# Patient Record
Sex: Male | Born: 1955 | Race: Black or African American | Hispanic: No | Marital: Married
Health system: Southern US, Community
[De-identification: ages and names within clinical notes are randomized; demographics above are authoritative.]

## PROBLEM LIST (undated history)

## (undated) DIAGNOSIS — E119 Type 2 diabetes mellitus without complications: Secondary | ICD-10-CM

## (undated) DIAGNOSIS — I509 Heart failure, unspecified: Secondary | ICD-10-CM

## (undated) DIAGNOSIS — E7800 Pure hypercholesterolemia, unspecified: Secondary | ICD-10-CM

## (undated) DIAGNOSIS — J42 Unspecified chronic bronchitis: Secondary | ICD-10-CM

## (undated) DIAGNOSIS — I10 Essential (primary) hypertension: Secondary | ICD-10-CM

## (undated) DIAGNOSIS — R0600 Dyspnea, unspecified: Secondary | ICD-10-CM

## (undated) DIAGNOSIS — J449 Chronic obstructive pulmonary disease, unspecified: Secondary | ICD-10-CM

## (undated) DIAGNOSIS — M1990 Unspecified osteoarthritis, unspecified site: Secondary | ICD-10-CM

## (undated) DIAGNOSIS — I2510 Atherosclerotic heart disease of native coronary artery without angina pectoris: Secondary | ICD-10-CM

## (undated) DIAGNOSIS — N189 Chronic kidney disease, unspecified: Secondary | ICD-10-CM

## (undated) HISTORY — PX: MULTIPLE TOOTH EXTRACTIONS: SHX2053

## (undated) HISTORY — PX: KNEE CARTILAGE SURGERY: SHX688

## (undated) HISTORY — PX: JOINT REPLACEMENT: SHX530

---

## 1998-05-14 ENCOUNTER — Inpatient Hospital Stay (HOSPITAL_COMMUNITY): Admission: EM | Admit: 1998-05-14 | Discharge: 1998-05-16 | Payer: Self-pay | Admitting: Emergency Medicine

## 2004-07-26 ENCOUNTER — Emergency Department (HOSPITAL_COMMUNITY): Admission: EM | Admit: 2004-07-26 | Discharge: 2004-07-27 | Payer: Self-pay

## 2012-08-21 ENCOUNTER — Ambulatory Visit
Admission: RE | Admit: 2012-08-21 | Discharge: 2012-08-21 | Disposition: A | Discharge status: Home / Self Care | Payer: Medicaid Other | Source: Ambulatory Visit | Attending: Cardiovascular Disease | Admitting: Cardiovascular Disease

## 2012-08-21 ENCOUNTER — Other Ambulatory Visit: Payer: Self-pay | Admitting: Cardiovascular Disease

## 2012-08-21 DIAGNOSIS — M25569 Pain in unspecified knee: Secondary | ICD-10-CM

## 2012-09-22 ENCOUNTER — Ambulatory Visit
Admission: RE | Admit: 2012-09-22 | Discharge: 2012-09-22 | Disposition: A | Discharge status: Home / Self Care | Payer: Medicaid Other | Source: Ambulatory Visit | Attending: Cardiovascular Disease | Admitting: Cardiovascular Disease

## 2012-09-22 ENCOUNTER — Other Ambulatory Visit: Payer: Self-pay | Admitting: Cardiovascular Disease

## 2012-09-22 DIAGNOSIS — J40 Bronchitis, not specified as acute or chronic: Secondary | ICD-10-CM

## 2016-12-09 ENCOUNTER — Emergency Department (HOSPITAL_COMMUNITY): Payer: Medicaid Other

## 2016-12-09 ENCOUNTER — Emergency Department (HOSPITAL_COMMUNITY)
Admission: EM | Admit: 2016-12-09 | Discharge: 2016-12-09 | Disposition: A | Discharge status: Home / Self Care | Payer: Medicaid Other | Attending: Emergency Medicine | Admitting: Emergency Medicine

## 2016-12-09 ENCOUNTER — Encounter (HOSPITAL_COMMUNITY): Payer: Self-pay

## 2016-12-09 DIAGNOSIS — F17200 Nicotine dependence, unspecified, uncomplicated: Secondary | ICD-10-CM | POA: Insufficient documentation

## 2016-12-09 DIAGNOSIS — R0602 Shortness of breath: Secondary | ICD-10-CM | POA: Insufficient documentation

## 2016-12-09 DIAGNOSIS — I10 Essential (primary) hypertension: Secondary | ICD-10-CM | POA: Diagnosis not present

## 2016-12-09 HISTORY — DX: Essential (primary) hypertension: I10

## 2016-12-09 HISTORY — DX: Pure hypercholesterolemia, unspecified: E78.00

## 2016-12-09 LAB — CBC
HEMATOCRIT: 38.9 % — AB (ref 39.0–52.0)
HEMOGLOBIN: 12.7 g/dL — AB (ref 13.0–17.0)
MCH: 27 pg (ref 26.0–34.0)
MCHC: 32.6 g/dL (ref 30.0–36.0)
MCV: 82.6 fL (ref 78.0–100.0)
Platelets: 143 10*3/uL — ABNORMAL LOW (ref 150–400)
RBC: 4.71 MIL/uL (ref 4.22–5.81)
RDW: 13.2 % (ref 11.5–15.5)
WBC: 3.9 10*3/uL — ABNORMAL LOW (ref 4.0–10.5)

## 2016-12-09 LAB — BASIC METABOLIC PANEL
ANION GAP: 12 (ref 5–15)
BUN: 19 mg/dL (ref 6–20)
CALCIUM: 9.1 mg/dL (ref 8.9–10.3)
CO2: 21 mmol/L — AB (ref 22–32)
Chloride: 101 mmol/L (ref 101–111)
Creatinine, Ser: 1.6 mg/dL — ABNORMAL HIGH (ref 0.61–1.24)
GFR calc Af Amer: 52 mL/min — ABNORMAL LOW (ref >60)
GFR calc non Af Amer: 45 mL/min — ABNORMAL LOW (ref >60)
GLUCOSE: 112 mg/dL — AB (ref 65–99)
Potassium: 3.6 mmol/L (ref 3.5–5.1)
Sodium: 134 mmol/L — ABNORMAL LOW (ref 135–145)

## 2016-12-09 LAB — I-STAT TROPONIN, ED: TROPONIN I, POC: 0 ng/mL (ref 0.00–0.08)

## 2016-12-09 MED ORDER — ALBUTEROL SULFATE HFA 108 (90 BASE) MCG/ACT IN AERS: 1.0000 | INHALATION_SPRAY | Freq: Four times a day (QID) | RESPIRATORY_TRACT | 0 refills | Status: DC | PRN

## 2016-12-09 MED ORDER — ALBUTEROL SULFATE HFA 108 (90 BASE) MCG/ACT IN AERS
2.0000 | INHALATION_SPRAY | Freq: Once | RESPIRATORY_TRACT | Status: AC
  Administered 2016-12-09: 2 via RESPIRATORY_TRACT
  Filled 2016-12-09: qty 6.7

## 2016-12-09 MED ORDER — AZITHROMYCIN 250 MG PO TABS: 250.0000 mg | ORAL_TABLET | Freq: Every day | ORAL | 0 refills | Status: DC

## 2016-12-09 MED ORDER — AZITHROMYCIN 250 MG PO TABS
500.0000 mg | ORAL_TABLET | Freq: Once | ORAL | Status: AC
  Administered 2016-12-09: 500 mg via ORAL
  Filled 2016-12-09: qty 2

## 2016-12-09 MED ORDER — IPRATROPIUM-ALBUTEROL 0.5-2.5 (3) MG/3ML IN SOLN
3.0000 mL | Freq: Once | RESPIRATORY_TRACT | Status: AC
  Administered 2016-12-09: 3 mL via RESPIRATORY_TRACT
  Filled 2016-12-09: qty 3

## 2016-12-09 MED ORDER — PREDNISONE 20 MG PO TABS
60.0000 mg | ORAL_TABLET | Freq: Once | ORAL | Status: AC
Start: 2016-12-09 — End: 2016-12-09
  Administered 2016-12-09: 60 mg via ORAL
  Filled 2016-12-09: qty 3

## 2016-12-09 MED ORDER — PREDNISONE 20 MG PO TABS: 40.0000 mg | ORAL_TABLET | Freq: Every day | ORAL | 0 refills | Status: AC

## 2016-12-09 NOTE — ED Provider Notes (Signed)
Emergency Department Provider Note   I have reviewed the triage vital signs and the nursing notes.   HISTORY  Chief Complaint Cough; Shortness of Breath; and Nasal Congestion   HPI Phillip Kennedy is a 61 y.o. male with PMH of HTN, HLD, and smoking history presents to the emergency department for evaluation of cough, difficulty breathing, constant chest discomfort for the past 3 days. Patient states that he recently stopped smoking cigarettes but smoked for many years prior to that. No known history of COPD. He states that breathing became more labored several days ago and he had associated chest pain that was initially with coughing only but is now become persistent over the past 2 days. He describes it as a tightness. There is no interval where he is completely pain-free. No pleuritic or exertional quality. Exertion does make his breathing worse. He feels better sitting up. No history of congestive heart failure. No lower extremity edema. No fevers or chills. Symptoms are moderate in severity.    Past Medical History:  Diagnosis Date  . Bronchitis   . High cholesterol   . Hypertension     There are no active problems to display for this patient.   History reviewed. No pertinent surgical history.    Allergies Patient has no known allergies.  No family history on file.  Social History Social History  Substance Use Topics  . Smoking status: Current Some Day Smoker  . Smokeless tobacco: Current User  . Alcohol use No    Review of Systems  Constitutional: No fever/chills Eyes: No visual changes. ENT: No sore throat. Cardiovascular: Positive chest pain. Respiratory: Positive shortness of breath and cough.  Gastrointestinal: No abdominal pain.  No nausea, no vomiting.  No diarrhea.  No constipation. Genitourinary: Negative for dysuria. Musculoskeletal: Negative for back pain. Skin: Negative for rash. Neurological: Negative for headaches, focal weakness or  numbness.  10-point ROS otherwise negative.  ____________________________________________   PHYSICAL EXAM:  VITAL SIGNS: ED Triage Vitals  Enc Vitals Group     BP 12/09/16 1446 (!) 130/101     Pulse Rate 12/09/16 1446 69     Resp 12/09/16 1446 18     Temp 12/09/16 1446 99 F (37.2 C)     Temp Source 12/09/16 1446 Oral     SpO2 12/09/16 1446 100 %     Weight 12/09/16 1447 250 lb (113.4 kg)     Height 12/09/16 1447 6' (1.829 m)     Pain Score 12/09/16 1446 8   Constitutional: Alert and oriented. Well appearing and in no acute distress. Eyes: Conjunctivae are normal. Head: Atraumatic. Nose: No congestion/rhinnorhea. Mouth/Throat: Mucous membranes are moist.  Oropharynx non-erythematous. Neck: No stridor.   Cardiovascular: Normal rate, regular rhythm. Good peripheral circulation. Grossly normal heart sounds.   Respiratory: Increased respiratory effort.  No retractions. Lungs with diffuse expiratory wheezing throughout.  Gastrointestinal: Soft and nontender. No distention.  Musculoskeletal: No lower extremity tenderness nor edema. No gross deformities of extremities. Neurologic:  Normal speech and language. No gross focal neurologic deficits are appreciated.  Skin:  Skin is warm, dry and intact. No rash noted. Psychiatric: Mood and affect are normal. Speech and behavior are normal.  ____________________________________________   LABS (all labs ordered are listed, but only abnormal results are displayed)  Labs Reviewed  BASIC METABOLIC PANEL - Abnormal; Notable for the following:       Result Value   Sodium 134 (*)    CO2 21 (*)  Glucose, Bld 112 (*)    Creatinine, Ser 1.60 (*)    GFR calc non Af Amer 45 (*)    GFR calc Af Amer 52 (*)    All other components within normal limits  CBC - Abnormal; Notable for the following:    WBC 3.9 (*)    Hemoglobin 12.7 (*)    HCT 38.9 (*)    Platelets 143 (*)    All other components within normal limits  I-STAT TROPOININ, ED    ____________________________________________  EKG   EKG Interpretation  Date/Time:  Sunday December 09 2016 14:46:56 EST Ventricular Rate:  68 PR Interval:  166 QRS Duration: 94 QT Interval:  406 QTC Calculation: 431 R Axis:   68 Text Interpretation:  Normal sinus rhythm Normal ECG No STEMI.  Confirmed by Jiovanna Frei MD, Martin Belling [phone]) on 12/09/2016 3:36:55 PM       ____________________________________________  RADIOLOGY  CXR reviewed.  ____________________________________________   PROCEDURES  Procedure(s) performed:   Procedures  None ____________________________________________   INITIAL IMPRESSION / ASSESSMENT AND PLAN / ED COURSE  Pertinent labs & imaging results that were available during my care of the patient were reviewed by me and considered in my medical decision making (see chart for details).  Patient resents to the emergency department for evaluation of difficult to breathing, cough, constant chest tightness for the past 2-3 days. Symptoms are most consistent with COPD which the patient undoubtedly has after many years of smoking. He has diffuse wheezing. No infiltrate on chest x-ray. No fevers, shaking chills, bodyaches to increase suspicion for flu. Patient does have an elevated creatinine but no baseline value for comparison. BUN is normal. Low suspicion for acute kidney injury in this case. No indication for biomarker trending with 2 days of constant chest tightness and unremarkable EKG. Plan for breathing treatment, steroid, and reassessment after COPD management.   05:02 PM Patient is feeling much better after neb. Still has some bilateral wheezing on exam. CP is greatly improved. Will give an additional neb and reassess.   06:00 PM Patient is feeling much better. Plan for discharge with albuterol inh, steroid burst, and Azithrmycin for presumed COPD exacerbation. Will follow with PCP for evaluation of official COPD diagnosis.   At this time, I do not  feel there is any life-threatening condition present. I have reviewed and discussed all results (EKG, imaging, lab, urine as appropriate), exam findings with patient. I have reviewed nursing notes and appropriate previous records.  I feel the patient is safe to be discharged home without further emergent workup. Discussed usual and customary return precautions. Patient and family (if present) verbalize understanding and are comfortable with this plan.  Patient will follow-up with their primary care provider. If they do not have a primary care provider, information for follow-up has been provided to them. All questions have been answered.  ____________________________________________  FINAL CLINICAL IMPRESSION(S) / ED DIAGNOSES  Final diagnoses:  Shortness of breath     MEDICATIONS GIVEN DURING THIS VISIT:  Medications  ipratropium-albuterol (DUONEB) 0.5-2.5 (3) MG/3ML nebulizer solution 3 mL (3 mLs Nebulization Given 12/09/16 1601)  predniSONE (DELTASONE) tablet 60 mg (60 mg Oral Given 12/09/16 1617)  ipratropium-albuterol (DUONEB) 0.5-2.5 (3) MG/3ML nebulizer solution 3 mL (3 mLs Nebulization Given 12/09/16 1733)  azithromycin (ZITHROMAX) tablet 500 mg (500 mg Oral Given 12/09/16 1733)  albuterol (PROVENTIL HFA;VENTOLIN HFA) 108 (90 Base) MCG/ACT inhaler 2 puff (2 puffs Inhalation Given 12/09/16 1816)     NEW OUTPATIENT MEDICATIONS STARTED DURING THIS  VISIT:  Discharge Medication List as of 12/09/2016  6:04 PM    START taking these medications   Details  albuterol (PROVENTIL HFA;VENTOLIN HFA) 108 (90 Base) MCG/ACT inhaler Inhale 1-2 puffs into the lungs every 6 (six) hours as needed for wheezing or shortness of breath., Starting Sun 12/09/2016, Print    azithromycin (ZITHROMAX) 250 MG tablet Take 1 tablet (250 mg total) by mouth daily. Take first 2 tablets together, then 1 every day until finished., Starting Sun 12/09/2016, Print    predniSONE (DELTASONE) 20 MG tablet Take 2 tablets (40 mg  total) by mouth daily., Starting Mon 12/10/2016, Until Fri 12/14/2016, Print          Note:  This document was prepared using Dragon voice recognition software and may include unintentional dictation errors.  Alona Bene, MD Emergency Medicine   Maia Plan, MD 12/11/16 1106

## 2016-12-09 NOTE — Discharge Instructions (Signed)
We believe your persistent cough is a result of a viral syndrome for which your symptoms have still not quite resolved.  Sometimes it takes many weeks to completely go away, especially if you smoke or have chronic lung problems.  Please take any medications prescribed and follow up as recommended with your regular doctor.  Take the medication as prescribed and discuss the possibility of COPD with your PCP.   If you develop any new or worsening symptoms, including but not limited to fever, persistent vomiting, worsening shortness of breath, or other symptoms that concern you, please return to the Emergency Department immediately.

## 2016-12-09 NOTE — ED Triage Notes (Signed)
Pt. Stated, I've got flu-like symptoms with cough, congestion, SOB, and chest pain, I also have bronchitis that comes and goes. My symptoms start on Thursday.

## 2017-05-09 ENCOUNTER — Other Ambulatory Visit: Payer: Self-pay | Admitting: Cardiovascular Disease

## 2017-05-09 DIAGNOSIS — M79604 Pain in right leg: Secondary | ICD-10-CM

## 2017-05-09 DIAGNOSIS — M7989 Other specified soft tissue disorders: Principal | ICD-10-CM

## 2017-05-10 ENCOUNTER — Ambulatory Visit (HOSPITAL_COMMUNITY)
Admission: RE | Admit: 2017-05-10 | Discharge: 2017-05-10 | Disposition: A | Discharge status: Home / Self Care | Payer: Medicaid Other | Source: Ambulatory Visit | Attending: Cardiovascular Disease | Admitting: Cardiovascular Disease

## 2017-05-10 DIAGNOSIS — M79661 Pain in right lower leg: Secondary | ICD-10-CM | POA: Diagnosis present

## 2017-05-10 DIAGNOSIS — M7989 Other specified soft tissue disorders: Secondary | ICD-10-CM

## 2017-05-10 DIAGNOSIS — M79604 Pain in right leg: Secondary | ICD-10-CM | POA: Diagnosis not present

## 2017-09-04 ENCOUNTER — Encounter (HOSPITAL_COMMUNITY): Payer: Self-pay

## 2017-09-04 ENCOUNTER — Emergency Department (HOSPITAL_COMMUNITY)
Admission: EM | Admit: 2017-09-04 | Discharge: 2017-09-04 | Disposition: A | Discharge status: Home / Self Care | Payer: Medicaid Other | Attending: Emergency Medicine | Admitting: Emergency Medicine

## 2017-09-04 DIAGNOSIS — I10 Essential (primary) hypertension: Secondary | ICD-10-CM | POA: Diagnosis not present

## 2017-09-04 DIAGNOSIS — E7800 Pure hypercholesterolemia, unspecified: Secondary | ICD-10-CM | POA: Diagnosis not present

## 2017-09-04 DIAGNOSIS — F17200 Nicotine dependence, unspecified, uncomplicated: Secondary | ICD-10-CM | POA: Insufficient documentation

## 2017-09-04 DIAGNOSIS — L02411 Cutaneous abscess of right axilla: Secondary | ICD-10-CM | POA: Diagnosis present

## 2017-09-04 DIAGNOSIS — Z79899 Other long term (current) drug therapy: Secondary | ICD-10-CM | POA: Diagnosis not present

## 2017-09-04 MED ORDER — LIDOCAINE HCL 2 % IJ SOLN
10.0000 mL | Freq: Once | INTRAMUSCULAR | Status: AC
  Administered 2017-09-04: 200 mg
  Filled 2017-09-04: qty 20

## 2017-09-04 MED ORDER — SULFAMETHOXAZOLE-TRIMETHOPRIM 800-160 MG PO TABS: 1.0000 | ORAL_TABLET | Freq: Two times a day (BID) | ORAL | 0 refills | Status: AC

## 2017-09-04 NOTE — ED Triage Notes (Signed)
Per Pt, Pt is coming to have an abscess under his right arm lanced. Pt reports hx of HTN and compliant with medication.

## 2017-09-04 NOTE — ED Provider Notes (Signed)
INCISION AND DRAINAGE Performed by: Palma Holter Consent: Verbal consent obtained. Risks and benefits: risks, benefits and alternatives were discussed Type: abscess  Body area: Right axilla  Anesthesia: local infiltration  Area cleaned with iodine. Incision was made with a scalpel.  Local anesthetic: lidocaine 2% withoutepinephrine  Anesthetic total: 7 ml  Blunt dissection to break up loculations  Drainage: purulent  Drainage amount: ~1 cc  Packing material: none  Patient tolerance: Patient tolerated the procedure well with no immediate complications.     Palma Holter, MD 09/04/17 1419    Doug Sou, MD 09/04/17 1728

## 2017-09-04 NOTE — ED Notes (Signed)
dsg applied to right armpit

## 2017-09-04 NOTE — Discharge Instructions (Signed)
Return if any problems.

## 2017-09-04 NOTE — ED Provider Notes (Signed)
[facility] EMERGENCY [facility]PARTMENT Provider Note   CSN: [id number] Arrival date & time: 09/04/17  1104     History   Chief Complaint Chief Complaint  Patient presents with  . Abscess    HPI Reed PandyMichael L Engelbert is a 61 y.o. male.  The history is provided by the patient. No language interpreter was used.  Abscess  Location:  Shoulder/arm Shoulder/arm abscess location:  L axilla Size:  2 Abscess quality: painful and redness   Red streaking: no   Progression:  Worsening Pain details:    Quality:  Aching   Severity:  Moderate   Timing:  Constant   Progression:  Worsening Relieved by:  Nothing Worsened by:  Nothing Ineffective treatments:  None tried Associated symptoms: no nausea and no vomiting   Pt reports similar in the past.  Pt is not diabetic.  Pt has known htn.  He is working with his MD to get down.  He is taking his medication.  He has an appointment in 1 week for rehcek  Past Medical History:  Diagnosis Date  . Bronchitis   . High cholesterol   . Hypertension     There are no active problems to display for this patient.   History reviewed. No pertinent surgical history.     Home Medications    Prior to Admission medications   Medication Sig Start Date End Date Taking? Authorizing Provider  albuterol (PROVENTIL HFA;VENTOLIN HFA) 108 (90 Base) MCG/ACT inhaler Inhale 1-2 puffs into the lungs every 6 (six) hours as needed for wheezing or shortness of breath. 12/09/16   Long, Arlyss RepressJoshua G, MD  amLODipine (NORVASC) 5 MG tablet Take 5 mg by mouth 2 (two) times daily.    [provider]  azithromycin (ZITHROMAX) 250 MG tablet Take 1 tablet (250 mg total) by mouth daily. Take first 2 tablets together, then 1 every day until finished. 12/09/16   Long, Arlyss RepressJoshua G, MD  hydrALAZINE (APRESOLINE) 25 MG tablet Take 25 mg by mouth 3 (three) times daily.    [provider]  ibuprofen (ADVIL,MOTRIN) 200 MG tablet Take 400 mg by mouth every 6 (six)  hours as needed for headache (pain).    [provider]  metoprolol (LOPRESSOR) 100 MG tablet Take 100 mg by mouth 2 (two) times daily.    [provider]  Omega-3 Fatty Acids (FISH OIL PO) Take 1 capsule by mouth daily.    [provider]  pravastatin (PRAVACHOL) 40 MG tablet Take 40 mg by mouth daily.    [provider]    Family History No family history on file.  Social History Social History   Tobacco Use  . Smoking status: Current Some Day Smoker  . Smokeless tobacco: Current User  Substance Use Topics  . Alcohol use: No  . Drug use: No     Allergies   Patient has no known allergies.   Review of Systems Review of Systems  Gastrointestinal: Negative for nausea and vomiting.  All other systems reviewed and are negative.    Physical Exam Updated Vital Signs BP (!) 156/122 (BP Location: Right Arm)   Pulse 65   Temp 97.8 F (36.6 C) (Oral)   Resp 18   Ht 6' (1.829 m)   Wt 113.4 kg (250 lb)   SpO2 100%   BMI 33.91 kg/m   Physical Exam  Constitutional: He is oriented to person, place, and time. He appears well-developed and well-nourished.  HENT:  Head: Normocephalic.  Eyes:  EOM are normal.  Neck: Normal range of motion.  Cardiovascular: Normal rate.  Pulmonary/Chest: Effort normal.  Abdominal: He exhibits no distension.  Musculoskeletal: Normal range of motion.  2cm abscess right axilla   Neurological: He is alert and oriented to person, place, and time.  Psychiatric: He has a normal mood and affect.  Nursing note and vitals reviewed.    ED Treatments / Results  Labs (all labs ordered are listed, but only abnormal results are displayed) Labs Reviewed - No data to display  EKG  EKG Interpretation None       Radiology No results found.  Procedures Procedures (including critical care time)  Medications Ordered in ED Medications  lidocaine (XYLOCAINE) 2 % (with pres) injection 200 mg (200 mg Infiltration  Given by Other 09/04/17 1423)     Initial Impression / Assessment and Plan / ED Course  I have reviewed the triage vital signs and the nursing notes.  Pertinent labs & imaging results that were available during my care of the patient were reviewed by me and considered in my medical decision making (see chart for details).       Final Clinical Impressions(s) / ED Diagnoses   Final diagnoses:  Abscess of axilla, right    ED Discharge Orders    None    Scheduled Meds: Continuous Infusions: PRN Meds:. An After Visit Summary was printed and given to the patient.    Elson Areas, [place] 09/04/17 1435    Doug Sou, MD 09/04/17 [phone]

## 2017-09-16 DIAGNOSIS — M1711 Unilateral primary osteoarthritis, right knee: Secondary | ICD-10-CM | POA: Diagnosis present

## 2017-09-16 NOTE — H&P (Signed)
PREOPERATIVE H&P Patient ID: Phillip Kennedy MRN: [id number] DOB/AGE: 06/29/1956 61 y.o.  Chief Complaint: OA RIGHT KNEE  Planned Procedure Date: 10/08/17 Medical and Cardiac Clearance Pending dt poorly controlled HTN: Dr. Algie Coffer  HPI: Phillip Kennedy is a 61 y.o. male with a history of hypertension, and hyperlipidemia.  He has a history of bilateral patella tendon repair.  He presents today for evaluation of OA RIGHT KNEE. The patient has a history of pain and functional disability in the right knee due to arthritis and has failed non-surgical conservative treatments for greater than 12 weeks to include NSAID's and/or analgesics, corticosteriod injections and activity modification.  Onset of symptoms was gradual, starting 5 years ago with gradually worsening course since that time.   Patient currently rates pain at 8 out of 10 with activity. Patient has night pain, worsening of pain with activity and weight bearing and pain that interferes with activities of daily living.  Patient has evidence of subchondral cysts, subchondral sclerosis, periarticular osteophytes and joint space narrowing by imaging studies. There is no active infection.  Past Medical History:  Diagnosis Date  . Bronchitis   . High cholesterol   . Hypertension    Surgical History: Bilateral knee patella repair 1979, 1983.  Allergies: No known allergies.  Medications: Amlodipine 5 mg twice daily Hydralazine 25 mg 3 times daily Losartan potassium 50 mg daily  Pravastatin 40 mg daily Calcium plus vitamin D3 600 mg daily Fish oil omega-3 daily  Social History: Married, current one half to three-quarter pack per day smoker x20+ years.  Previous drug addiction.  He lives with his wife and his son.  Family History: Mother with heart disease, MI, HTN, DM, CVA, CKD.  Father with heart disease, MI, HTN, CVA.  Sister with heart disease, MI, HTN, DM, CVA.  Grandparents with MI, DM  ROS: Currently denies lightheadedness,  dizziness, Fever, chills, CP, SOB.  No personal history of DVT, PE, MI, or CVA. No loose teeth.  He has dentures and a partial. Occasional tinnitus, hoarseness, ankle swelling (R>L)  All other systems have been reviewed and were otherwise currently negative with the exception of those mentioned in the HPI and as above.  Objective: Vitals: Ht: 5'11 Wt: 222 Temp: 98.1 BP: 152/102 Pulse: 96 O2 96% on room air. Physical Exam: General: Alert, NAD.  Antalgic Gait  HEENT: EOMI, Good Neck Extension  Pulm: No increased work of breathing.  Clear B/L A/P w/o crackle or wheeze.  CV: RRR, No m/g/r appreciated  GI: soft, NT, ND Neuro: Neuro without gross focal deficit.  Sensation intact distally Skin: No lesions in the area of chief complaint MSK/Surgical Site: Right knee w/o redness or effusion.  Remote patella tendon repair incision healed. + JLT. AROM 2-95.  5/5 strength in extension and flexion.  +EHL/FHL.  NVI.  Stable varus and valgus stress.  Mild non-pitting edema R>L.   Imaging Review Plain radiographs demonstrate severe degenerative joint disease of both knees.   Assessment: OA RIGHT KNEE Principal Problem:   Primary osteoarthritis of right knee   Plan: Plan for Procedure(s): TOTAL KNEE ARTHROPLASTY  The patient history, physical exam, clinical judgement of the provider and imaging are consistent with end stage degenerative joint disease and total joint arthroplasty is deemed medically necessary. The treatment options including medical management, injection therapy, and arthroplasty were discussed at length. The risks and benefits of Procedure(s): TOTAL KNEE ARTHROPLASTY were presented and reviewed.  The risks of nonoperative treatment, versus surgical intervention including but not  limited to continued pain, aseptic loosening, stiffness, dislocation/subluxation, infection, bleeding, nerve injury, blood clots, cardiopulmonary complications, morbidity, mortality, among others were  discussed. The patient verbalizes understanding and wishes to proceed with the plan.  Patient is being admitted for inpatient treatment for surgery, pain control, PT, OT, prophylactic antibiotics, VTE prophylaxis, progressive ambulation, ADL's and discharge planning.   Dental prophylaxis discussed and recommended for 2 years postoperatively.   The patient does meet the criteria for TXA which will be used perioperatively via IV.    Xarelto  will be used postoperatively for DVT prophylaxis dt CKD in addition to SCDs, and early ambulation.  Limit NSAIDs dt h/o decreased kidney function.  The patient is planning to be discharged home with home health services ([facility]) in care of his wife and son.  Severity of Illness: The appropriate patient status for this patient is OBSERVATION. Observation status is judged to be reasonable and necessary in order to provide the required intensity of service to ensure the patient's safety. The patient's presenting symptoms, physical exam findings, and initial radiographic and laboratory data in the context of their medical condition is felt to place them at decreased risk for further clinical deterioration. Furthermore, it is anticipated that the patient will be medically stable for discharge from the hospital within 2 midnights of admission. The following factors support the patient status of observation.    Albina BilletHenry Calvin Martensen III, PA-C 09/16/2017 8:24 AM

## 2017-09-26 NOTE — Pre-Procedure Instructions (Signed)
Phillip Kennedy  09/26/2017      [id number] - [place], [place] - [address] [place] [address] Phone: [phone] Fax: [phone]    Your procedure is scheduled on December 18  Report to [facility] Admitting at [phone] A.M.  Call this number if you have problems the morning of surgery:  [phone]   Remember:  Do not eat food or drink liquids after midnight.  Continue all medications as directed by your physician except follow these medication instructions before surgery below   Take these medicines the morning of surgery with A SIP OF WATER  albuterol (PROVENTIL HFA;VENTOLIN HFA)  amLODipine (NORVASC) hydrALAZINE (APRESOLINE) metoprolol (LOPRESSOR)  7 days prior to surgery STOP taking any Aspirin(unless otherwise instructed by your surgeon), Aleve, Naproxen, Ibuprofen, Motrin, Advil, Goody's, BC's, all herbal medications, fish oil, and all vitamins    Do not wear jewelry  Do not wear lotions, powders, or cologne, or deoderant.   Men may shave face and neck.  Do not bring valuables to the hospital.  [facility] is not responsible for any belongings or valuables.  Contacts, dentures or bridgework may not be worn into surgery.  Leave your suitcase in the car.  After surgery it may be brought to your room.  For patients admitted to the hospital, discharge time will be determined by your treatment team.  Patients discharged the day of surgery will not be allowed to drive home.    Special instructions:   [facility]- Preparing For Surgery  Before surgery, you can play an important role. Because skin is not sterile, your skin needs to be as free of germs as possible. You can reduce the number of germs on your skin by washing with CHG (chlorahexidine gluconate) Soap before surgery.  CHG is an antiseptic cleaner which kills germs and bonds with the skin to continue killing germs even after  washing.  Please do not use if you have an allergy to CHG or antibacterial soaps. If your skin becomes reddened/irritated stop using the CHG.  Do not shave (including legs and underarms) for at least 48 hours prior to first CHG shower. It is OK to shave your face.  Please follow these instructions carefully.   1. Shower the NIGHT BEFORE SURGERY and the MORNING OF SURGERY with CHG.   2. If you chose to wash your hair, wash your hair first as usual with your normal shampoo.  3. After you shampoo, rinse your hair and body thoroughly to remove the shampoo.  4. Use CHG as you would any other liquid soap. You can apply CHG directly to the skin and wash gently with a scrungie or a clean washcloth.   5. Apply the CHG Soap to your body ONLY FROM THE NECK DOWN.  Do not use on open wounds or open sores. Avoid contact with your eyes, ears, mouth and genitals (private parts). Wash Face and genitals (private parts)  with your normal soap.  6. Wash thoroughly, paying special attention to the area where your surgery will be performed.  7. Thoroughly rinse your body with warm water from the neck down.  8. DO NOT shower/wash with your normal soap after using and rinsing off the CHG Soap.  9. Pat yourself dry with a CLEAN TOWEL.  10. Wear CLEAN PAJAMAS to bed the night before surgery, wear comfortable clothes the morning of surgery  11. Place CLEAN SHEETS on your bed the  night of your first shower and DO NOT SLEEP WITH PETS.    Day of Surgery: Do not apply any deodorants/lotions. Please wear clean clothes to the hospital/surgery center.      Please read over the following fact sheets that you were given.

## 2017-09-27 ENCOUNTER — Other Ambulatory Visit: Payer: Self-pay

## 2017-09-27 ENCOUNTER — Encounter (HOSPITAL_COMMUNITY)
Admission: RE | Admit: 2017-09-27 | Discharge: 2017-09-27 | Disposition: A | Discharge status: Home / Self Care | Payer: Medicaid Other | Source: Ambulatory Visit | Attending: Orthopedic Surgery | Admitting: Orthopedic Surgery

## 2017-09-27 ENCOUNTER — Encounter (HOSPITAL_COMMUNITY): Payer: Self-pay

## 2017-09-27 DIAGNOSIS — I129 Hypertensive chronic kidney disease with stage 1 through stage 4 chronic kidney disease, or unspecified chronic kidney disease: Secondary | ICD-10-CM | POA: Diagnosis not present

## 2017-09-27 DIAGNOSIS — Z79899 Other long term (current) drug therapy: Secondary | ICD-10-CM | POA: Diagnosis not present

## 2017-09-27 DIAGNOSIS — N183 Chronic kidney disease, stage 3 (moderate): Secondary | ICD-10-CM | POA: Insufficient documentation

## 2017-09-27 DIAGNOSIS — M1990 Unspecified osteoarthritis, unspecified site: Secondary | ICD-10-CM | POA: Insufficient documentation

## 2017-09-27 DIAGNOSIS — F17200 Nicotine dependence, unspecified, uncomplicated: Secondary | ICD-10-CM | POA: Insufficient documentation

## 2017-09-27 DIAGNOSIS — Z01812 Encounter for preprocedural laboratory examination: Secondary | ICD-10-CM | POA: Diagnosis present

## 2017-09-27 HISTORY — DX: Unspecified osteoarthritis, unspecified site: M19.90

## 2017-09-27 HISTORY — DX: Dyspnea, unspecified: R06.00

## 2017-09-27 LAB — BASIC METABOLIC PANEL
Anion gap: 9 (ref 5–15)
BUN: 20 mg/dL (ref 6–20)
CALCIUM: 9.8 mg/dL (ref 8.9–10.3)
CO2: 23 mmol/L (ref 22–32)
CREATININE: 1.78 mg/dL — AB (ref 0.61–1.24)
Chloride: 109 mmol/L (ref 101–111)
GFR calc non Af Amer: 39 mL/min — ABNORMAL LOW (ref >60)
GFR, EST AFRICAN AMERICAN: 46 mL/min — AB (ref >60)
Glucose, Bld: 104 mg/dL — ABNORMAL HIGH (ref 65–99)
Potassium: 3.7 mmol/L (ref 3.5–5.1)
SODIUM: 141 mmol/L (ref 135–145)

## 2017-09-27 LAB — CBC
HCT: 37.9 % — ABNORMAL LOW (ref 39.0–52.0)
Hemoglobin: 12.6 g/dL — ABNORMAL LOW (ref 13.0–17.0)
MCH: 28 pg (ref 26.0–34.0)
MCHC: 33.2 g/dL (ref 30.0–36.0)
MCV: 84.2 fL (ref 78.0–100.0)
PLATELETS: 231 10*3/uL (ref 150–400)
RBC: 4.5 MIL/uL (ref 4.22–5.81)
RDW: 13.5 % (ref 11.5–15.5)
WBC: 5.2 10*3/uL (ref 4.0–10.5)

## 2017-09-27 LAB — SURGICAL PCR SCREEN
MRSA, PCR: NEGATIVE
STAPHYLOCOCCUS AUREUS: NEGATIVE

## 2017-09-27 NOTE — Progress Notes (Signed)
  Cardiologist - Orpah Cobb  Chest x-ray - 12/09/16 EKG - 12/09/16 Stress Test - denies ECHO - denies Cardiac Cath - denies  Anesthesia review: yes for records requested  Patient denies shortness of breath, fever, cough and chest pain at PAT appointment   Patient verbalized understanding of instructions that were given to them at the PAT appointment. Patient was also instructed that they will need to review over the PAT instructions again at home before surgery.

## 2017-09-30 ENCOUNTER — Inpatient Hospital Stay (HOSPITAL_COMMUNITY): Payer: Medicaid Other | Admitting: Anesthesiology

## 2017-09-30 ENCOUNTER — Inpatient Hospital Stay (HOSPITAL_COMMUNITY): Payer: Medicaid Other | Admitting: Vascular Surgery

## 2017-09-30 NOTE — Progress Notes (Addendum)
Anesthesia Chart Review: Patient is a 61 year old male scheduled for right TKA on 10/08/17 by Dr. Margarita Rana. Anesthesia type is posted as Choice.  History includes smoking, HTN, hypercholesterolemia, bronchitis, dyspnea, arthritis, left axillary abscess s/p I&D 09/04/17. He did not report a history of CKD, but labs from PAT and 11/2016 are consistent with CKD stage 3A.  PCP is listed as Dr. Orpah Cobb (cardiologist). Records pending (including labs), but ortho notes indicate they have requested medical/cardiac clearances from Dr. Algie Coffer.   Meds include albuterol, amlodipine, clonidine, hydralazine, losartan, Lopressor, fish oil, pravastatin.  BP (!) 145/92   Pulse 91   Temp (!) 36.2 C   Resp 20   Ht 6\' 1"  (1.854 m)   Wt 220 lb (99.8 kg)   SpO2 97%   BMI 29.03 kg/m   EKG 12/09/16 (duing in ED during COPD exacerbation evaluation): NSR.  Preoperative labs noted. BUN 20, Cr 1.78, previously 19/1.60 on 12/09/16. H/H 12.6/37.9, PLT 231. Glucose 104.   He denied SOB, fever, cough, chest pain at PAT. He denied prior stress, echo, and cardiac cath. Appears he is seeing Dr. Algie Coffer for primary care and not cardiology, but still awaiting records. Labs faxed to Dr. Algie Coffer for review.   Velna Ochs [facility] Short Stay Center/Anesthesiology Phone [phone] 09/30/2017 3:26 PM  Addendum: Clearance note from Dr. Algie Coffer received. Echo, EKG, and labs reports also received (see below). He cleared patient from a medical and cardiac standpoint. Last office visit 09/24/17. Following evaluation and echo, he wrote, "May undergo Knee surgery as arranged."  EKG 06/26/17: NSR.  Echo 09/24/17: Normal LV size and systolic function with EF 60%. Mild diastolic dysfunction. Normal RV size and systolic function. Normal LA and RA size. Normal aortic valve, mitral valve, pulmonary valve, and tricuspid valve. Minimal MR and TR.    Latest labs at his office (done 07/11/17) show a BUN of 18 and a Cr of  1.60 which is consistent with pre-operative labs.   Based on currently available information, I anticipate that he can proceed as planned if no acute changes.  Velna O[facility] Short Stay Center/Anesthesiology Phone [phone] 10/01/2017 4:31 PM

## 2017-10-07 MED ORDER — ACETAMINOPHEN 500 MG PO TABS
1000.0000 mg | ORAL_TABLET | Freq: Once | ORAL | Status: AC
  Administered 2017-10-08: 1000 mg via ORAL
  Filled 2017-10-07: qty 2

## 2017-10-07 MED ORDER — CEFAZOLIN SODIUM-DEXTROSE 2-4 GM/100ML-% IV SOLN
2.0000 g | INTRAVENOUS | Status: DC
  Filled 2017-10-07: qty 100

## 2017-10-07 MED ORDER — TRANEXAMIC ACID 1000 MG/10ML IV SOLN
1000.0000 mg | INTRAVENOUS | Status: DC
  Filled 2017-10-07: qty 10

## 2017-10-07 MED ORDER — LACTATED RINGERS IV SOLN: INTRAVENOUS | Status: DC

## 2017-10-07 MED ORDER — GABAPENTIN 300 MG PO CAPS
300.0000 mg | ORAL_CAPSULE | Freq: Once | ORAL | Status: AC
  Administered 2017-10-08: 300 mg via ORAL
  Filled 2017-10-07: qty 1

## 2017-10-08 ENCOUNTER — Encounter (HOSPITAL_COMMUNITY)
Admission: RE | Disposition: A | Discharge status: Home / Self Care | Payer: Self-pay | Source: Ambulatory Visit | Attending: Orthopedic Surgery

## 2017-10-08 ENCOUNTER — Encounter (HOSPITAL_COMMUNITY): Payer: Self-pay | Admitting: Anesthesiology

## 2017-10-08 ENCOUNTER — Ambulatory Visit (HOSPITAL_COMMUNITY)
Admission: RE | Admit: 2017-10-08 | Discharge: 2017-10-08 | Disposition: A | Discharge status: Home / Self Care | Payer: Medicaid Other | Source: Ambulatory Visit | Attending: Orthopedic Surgery | Admitting: Orthopedic Surgery

## 2017-10-08 DIAGNOSIS — E7800 Pure hypercholesterolemia, unspecified: Secondary | ICD-10-CM | POA: Diagnosis not present

## 2017-10-08 DIAGNOSIS — I10 Essential (primary) hypertension: Secondary | ICD-10-CM | POA: Diagnosis not present

## 2017-10-08 DIAGNOSIS — Z833 Family history of diabetes mellitus: Secondary | ICD-10-CM | POA: Diagnosis not present

## 2017-10-08 DIAGNOSIS — F17200 Nicotine dependence, unspecified, uncomplicated: Secondary | ICD-10-CM | POA: Diagnosis not present

## 2017-10-08 DIAGNOSIS — Z8249 Family history of ischemic heart disease and other diseases of the circulatory system: Secondary | ICD-10-CM | POA: Insufficient documentation

## 2017-10-08 DIAGNOSIS — M1711 Unilateral primary osteoarthritis, right knee: Secondary | ICD-10-CM | POA: Diagnosis not present

## 2017-10-08 SURGERY — ARTHROPLASTY, KNEE, TOTAL
Anesthesia: Choice | Site: Knee | Laterality: Right

## 2017-10-08 MED ORDER — METOPROLOL TARTRATE 50 MG PO TABS
ORAL_TABLET | ORAL | Status: AC
  Administered 2017-10-08: 100 mg via ORAL
  Filled 2017-10-08: qty 2

## 2017-10-08 MED ORDER — MIDAZOLAM HCL 2 MG/2ML IJ SOLN
INTRAMUSCULAR | Status: AC
  Filled 2017-10-08: qty 2

## 2017-10-08 MED ORDER — METOPROLOL TARTRATE 100 MG PO TABS
100.0000 mg | ORAL_TABLET | Freq: Once | ORAL | Status: AC
  Administered 2017-10-08: 100 mg via ORAL
  Filled 2017-10-08: qty 1

## 2017-10-08 MED ORDER — FENTANYL CITRATE (PF) 100 MCG/2ML IJ SOLN
INTRAMUSCULAR | Status: AC
  Filled 2017-10-08: qty 2

## 2017-10-08 MED ORDER — LACTATED RINGERS IV SOLN
Freq: Once | INTRAVENOUS | Status: AC
  Administered 2017-10-08: 09:00:00 via INTRAVENOUS

## 2017-10-08 MED ORDER — CHLORHEXIDINE GLUCONATE 4 % EX LIQD: 60.0000 mL | Freq: Once | CUTANEOUS | Status: DC

## 2017-10-08 MED ORDER — BUPIVACAINE HCL (PF) 0.25 % IJ SOLN
INTRAMUSCULAR | Status: AC
Start: 2017-10-08 — End: 2017-10-08
  Filled 2017-10-08: qty 30

## 2017-10-08 NOTE — Progress Notes (Signed)
Patient's IV removed- site clean, dry, and intact.  Patient has appointment scheduled with PCP at 11:00 today.  Patient ambulated to main entrance with wife and nurse tech.

## 2017-10-08 NOTE — Progress Notes (Signed)
Pt reports being out of Amlodipine and Metoprolol prescription for 1 month.

## 2017-10-08 NOTE — Anesthesia Preprocedure Evaluation (Signed)
Anesthesia Evaluation  Patient identified by MRN, date of birth, ID band Patient awake    Reviewed: Allergy & Precautions, NPO status , Patient's Chart, lab work & pertinent test results  Airway        Dental   Pulmonary Current Smoker,           Cardiovascular hypertension,      Neuro/Psych    GI/Hepatic   Endo/Other    Renal/GU      Musculoskeletal   Abdominal   Peds  Hematology   Anesthesia Other Findings   Reproductive/Obstetrics                             Anesthesia Physical Anesthesia Plan  ASA: III  Anesthesia Plan: Spinal   Post-op Pain Management:  Regional for Post-op pain   Induction:   PONV Risk Score and Plan: 1  Airway Management Planned: Natural Airway and Simple Face Mask  Additional Equipment:   Intra-op Plan:   Post-operative Plan:   Informed Consent: I have reviewed the patients History and Physical, chart, labs and discussed the procedure including the risks, benefits and alternatives for the proposed anesthesia with the patient or authorized representative who has indicated his/her understanding and acceptance.   Dental advisory given  Plan Discussed with: CRNA and Anesthesiologist  Anesthesia Plan Comments:         Anesthesia Quick Evaluation

## 2017-10-08 NOTE — Interval H&P Note (Signed)
History and Physical Interval Note:  10/08/2017 8:51 AM  Phillip Kennedy  has presented today for surgery, with the diagnosis of OA RIGHT KNEE  The various methods of treatment have been discussed with the patient and family. After consideration of risks, benefits and other options for treatment, the patient has consented to  Procedure(s): RIGHT TOTAL KNEE ARTHROPLASTY (Right) as a surgical intervention .  The patient's history has been reviewed, patient examined, no change in status, stable for surgery.  I have reviewed the patient's chart and labs.  Questions were answered to the patient's satisfaction.     Camary Sosa D

## 2017-11-26 DIAGNOSIS — I10 Essential (primary) hypertension: Secondary | ICD-10-CM | POA: Diagnosis present

## 2017-11-26 DIAGNOSIS — E7800 Pure hypercholesterolemia, unspecified: Secondary | ICD-10-CM | POA: Diagnosis present

## 2017-11-26 DIAGNOSIS — M1712 Unilateral primary osteoarthritis, left knee: Secondary | ICD-10-CM | POA: Diagnosis present

## 2017-11-26 NOTE — H&P (Signed)
PREOPERATIVE H&P Patient ID: Phillip Kennedy MRN: [id number] DOB/AGE: [birth year] 62 y.o.  Chief Complaint: OA LEFT KNEE  Planned Procedure Date: 12/24/17 Medical and Cardiac Clearance by Dr. Algie Coffer    HPI: Phillip Kennedy is a 62 y.o. male with a history of hypertension, and hyperlipidemia who presents for evaluation of OA LEFT KNEE.  He has a history of bilateral patella tendon repair. The patient has a history of pain and functional disability in the left knee due to arthritis and has failed non-surgical conservative treatments for greater than 12 weeks to include NSAID's and/or analgesics, corticosteriod injections and activity modification.  Onset of symptoms was gradual, starting 5 years ago with gradually worsening course since that time.  Patient currently rates pain at 8 out of 10 with activity. Patient has night pain, worsening of pain with activity and weight bearing and pain that interferes with activities of daily living.  Patient has evidence of subchondral cysts, subchondral sclerosis, periarticular osteophytes and joint space narrowing by imaging studies.  There is no active infection.  Past Medical History:  Diagnosis Date  . Arthritis   . Bronchitis   . Dyspnea   . High cholesterol   . Hypertension    Surgical History: Bilateral knee patella repair 1979, 1983.   No Known Allergies   Prior to Admission medications   Medication Sig Start Date End Date Taking? Authorizing Provider  acetaminophen (TYLENOL) 500 MG tablet Take 1,000 mg by mouth every 6 (six) hours as needed for moderate pain or headache.    [provider]  albuterol (PROVENTIL HFA;VENTOLIN HFA) 108 (90 Base) MCG/ACT inhaler Inhale 1-2 puffs into the lungs every 6 (six) hours as needed for wheezing or shortness of breath. 12/09/16   Long, Arlyss Repress, MD  amLODipine (NORVASC) 5 MG tablet Take 5 mg by mouth 2 (two) times daily.    [provider]  cloNIDine (CATAPRES) 0.2 MG tablet Take 0.2 mg by  mouth at bedtime. 09/10/17   [provider]  hydrALAZINE (APRESOLINE) 25 MG tablet Take 25 mg by mouth 3 (three) times daily.    [provider]  losartan (COZAAR) 100 MG tablet Take 100 mg by mouth daily.    [provider]  metoprolol (LOPRESSOR) 100 MG tablet Take 100 mg by mouth 2 (two) times daily.    [provider]  Omega-3 Fatty Acids (FISH OIL PO) Take 1 capsule by mouth daily.    [provider]  pravastatin (PRAVACHOL) 40 MG tablet Take 40 mg by mouth daily.    [provider]   Social History: Married, current one half to three-quarter pack per day smoker x20+ years.  Previous drug addiction.  He lives with his wife and his son.  Family History: Mother with heart disease, MI, HTN, DM, CVA, CKD.  Father with heart disease, MI, HTN, CVA.  Sister with heart disease, MI, HTN, DM, CVA.  Grandparents with MI, DM   ROS: Currently denies lightheadedness, dizziness, Fever, chills, CP, SOB.   No personal history of DVT, PE, MI, or CVA. No loose teeth.  He has dentures and a partial. Occasional tinnitus, hoarseness, ankle swelling (R>L)  All other systems have been reviewed and were otherwise currently negative with the exception of those mentioned in the HPI and as above.   Objective: Physical Exam: General: Alert, NAD.  Antalgic Gait  HEENT: EOMI, Good Neck Extension  Pulm: No increased work of breathing.  Clear B/L A/P w/o crackle or wheeze.  CV:  RRR, No m/g/r appreciated  GI: soft, NT, ND Neuro: Neuro without gross focal deficit.  Sensation intact distally Skin: No lesions in the area of chief complaint MSK/Surgical Site: Left knee w/o redness or effusion.  Remote bilateral patella tendon repair incisions healed. + JLT. AROM 2-100.  5/5 strength in extension and flexion.  +EHL/FHL.  NVI.  Stable varus and valgus stress.  Mild non-pitting edema R>L.   Imaging Review Plain radiographs demonstrate severe degenerative joint  disease of the bilateral knees.   Assessment: OA LEFT KNEE Principal Problem:   Primary osteoarthritis of left knee Active Problems:   Hypertension   High cholesterol   Plan: Plan for Procedure(s): LEFT TOTAL KNEE ARTHROPLASTY  The patient history, physical exam, clinical judgement of the provider and imaging are consistent with end stage degenerative joint disease and total joint arthroplasty is deemed medically necessary. The treatment options including medical management, injection therapy, and arthroplasty were discussed at length. The risks and benefits of Procedure(s): LEFT TOTAL KNEE ARTHROPLASTY were presented and reviewed.  The risks of nonoperative treatment, versus surgical intervention including but not limited to continued pain, aseptic loosening, stiffness, dislocation/subluxation, infection, bleeding, nerve injury, blood clots, cardiopulmonary complications, morbidity, mortality, among others were discussed. The patient verbalizes understanding and wishes to proceed with the plan.  Patient is being admitted for inpatient treatment for surgery, pain control, PT, OT, prophylactic antibiotics, VTE prophylaxis, progressive ambulation, ADL's and discharge planning.   Dental prophylaxis discussed and recommended for 2 years postoperatively.   The patient does meet the criteria for TXA which will be used perioperatively via IV.    Xarelto  will be used postoperatively for DVT prophylaxis dt CKD in addition to SCDs, and early ambulation.  Limit NSAIDs dt h/o decreased kidney function.  The patient is planning to be discharged home with home health services ([facility]) in care of his wife and son.  Severity of Illness: The appropriate patient status for this patient is OBSERVATION. Observation status is judged to be reasonable and necessary in order to provide the required intensity of service to ensure the patient's safety. The patient's presenting symptoms, physical exam  findings, and initial radiographic and laboratory data in the context of their medical condition is felt to place them at decreased risk for further clinical deterioration. Furthermore, it is anticipated that the patient will be medically stable for discharge from the hospital within 2 midnights of admission. The following factors support the patient status of observation.     Albina Billet III, PA-C 11/26/2017 6:19 PM

## 2017-12-11 NOTE — Pre-Procedure Instructions (Signed)
Phillip Kennedy  12/11/2017      [facility] - [place], [place] - [address] [place] [address] Phone: [phone] Fax: [phone]    Your procedure is scheduled on  Tuesday 12/24/17  Report to [facility] Admitting at 1030 A.M.  Call this number if you have problems the morning of surgery:  [phone]   Remember:  Do not eat food or drink liquids after midnight.  Take these medicines the morning of surgery with A SIP OF WATER - ALBUTEROL INHALER, AMLODIPINE, HYDRALAZINE, METOPROLOL (LOPRESSOR)  7 days prior to surgery STOP taking any Aspirin(unless otherwise instructed by your surgeon), Aleve, Naproxen, Ibuprofen, Motrin, Advil, Goody's, BC's, all herbal medications, fish oil, and all vitamins    Do not wear jewelry, make-up or nail polish.  Do not wear lotions, powders, or perfumes, or deodorant.  Do not shave 48 hours prior to surgery.  Men may shave face and neck.  Do not bring valuables to the hospital.  [facility] is not responsible for any belongings or valuables.  Contacts, dentures or bridgework may not be worn into surgery.  Leave your suitcase in the car.  After surgery it may be brought to your room.  For patients admitted to the hospital, discharge time will be determined by your treatment team.  Patients discharged the day of surgery will not be allowed to drive home.   Name and phone number of your driver:    Special instructions:  [facility] - Preparing for Surgery  Before surgery, you can play an important role.  Because skin is not sterile, your skin needs to be as free of germs as possible.  You can reduce the number of germs on you skin by washing with CHG (chlorahexidine gluconate) soap before surgery.  CHG is an antiseptic cleaner which kills germs and bonds with the skin to continue killing germs even after washing.  Please DO NOT use if you have an allergy to CHG or  antibacterial soaps.  If your skin becomes reddened/irritated stop using the CHG and inform your nurse when you arrive at Short Stay.  Do not shave (including legs and underarms) for at least 48 hours prior to the first CHG shower.  You may shave your face.  Please follow these instructions carefully:   1.  Shower with CHG Soap the night before surgery and the                                morning of Surgery.  2.  If you choose to wash your hair, wash your hair first as usual with your       normal shampoo.  3.  After you shampoo, rinse your hair and body thoroughly to remove the                      Shampoo.  4.  Use CHG as you would any other liquid soap.  You can apply chg directly       to the skin and wash gently with scrungie or a clean washcloth.  5.  Apply the CHG Soap to your body ONLY FROM THE NECK DOWN.        Do not use on open wounds or open sores.  Avoid contact with your eyes,       ears, mouth and genitals (private parts).  Wash genitals (private parts)       with your normal soap.  6.  Wash thoroughly, paying special attention to the area where your surgery        will be performed.  7.  Thoroughly rinse your body with warm water from the neck down.  8.  DO NOT shower/wash with your normal soap after using and rinsing off       the CHG Soap.  9.  Pat yourself dry with a clean towel.            10.  Wear clean pajamas.            11.  Place clean sheets on your bed the night of your first shower and do not        sleep with pets.  Day of Surgery  Do not apply any lotions/deoderants the morning of surgery.  Please wear clean clothes to the hospital/surgery center.    Please read over the following fact sheets that you were given. Pain Booklet, MRSA Information and Surgical Site Infection Prevention

## 2017-12-12 ENCOUNTER — Other Ambulatory Visit: Payer: Self-pay

## 2017-12-12 ENCOUNTER — Encounter (HOSPITAL_COMMUNITY): Payer: Self-pay

## 2017-12-12 ENCOUNTER — Encounter (HOSPITAL_COMMUNITY)
Admission: RE | Admit: 2017-12-12 | Discharge: 2017-12-12 | Disposition: A | Discharge status: Home / Self Care | Payer: Medicaid Other | Source: Ambulatory Visit | Attending: Orthopedic Surgery | Admitting: Orthopedic Surgery

## 2017-12-12 DIAGNOSIS — Z01812 Encounter for preprocedural laboratory examination: Secondary | ICD-10-CM | POA: Insufficient documentation

## 2017-12-12 DIAGNOSIS — R001 Bradycardia, unspecified: Secondary | ICD-10-CM | POA: Insufficient documentation

## 2017-12-12 DIAGNOSIS — Z01810 Encounter for preprocedural cardiovascular examination: Secondary | ICD-10-CM | POA: Insufficient documentation

## 2017-12-12 LAB — BASIC METABOLIC PANEL
ANION GAP: 9 (ref 5–15)
BUN: 17 mg/dL (ref 6–20)
CO2: 23 mmol/L (ref 22–32)
Calcium: 9.1 mg/dL (ref 8.9–10.3)
Chloride: 108 mmol/L (ref 101–111)
Creatinine, Ser: 1.68 mg/dL — ABNORMAL HIGH (ref 0.61–1.24)
GFR calc Af Amer: 49 mL/min — ABNORMAL LOW (ref >60)
GFR, EST NON AFRICAN AMERICAN: 42 mL/min — AB (ref >60)
GLUCOSE: 86 mg/dL (ref 65–99)
POTASSIUM: 4.1 mmol/L (ref 3.5–5.1)
Sodium: 140 mmol/L (ref 135–145)

## 2017-12-12 LAB — CBC
HEMATOCRIT: 37.7 % — AB (ref 39.0–52.0)
HEMOGLOBIN: 11.9 g/dL — AB (ref 13.0–17.0)
MCH: 26.9 pg (ref 26.0–34.0)
MCHC: 31.6 g/dL (ref 30.0–36.0)
MCV: 85.3 fL (ref 78.0–100.0)
Platelets: 225 10*3/uL (ref 150–400)
RBC: 4.42 MIL/uL (ref 4.22–5.81)
RDW: 13.8 % (ref 11.5–15.5)
WBC: 4.8 10*3/uL (ref 4.0–10.5)

## 2017-12-12 LAB — HEMOGLOBIN A1C
Hgb A1c MFr Bld: 6.6 % — ABNORMAL HIGH (ref 4.8–5.6)
MEAN PLASMA GLUCOSE: 142.72 mg/dL

## 2017-12-12 LAB — SURGICAL PCR SCREEN
MRSA, PCR: NEGATIVE
Staphylococcus aureus: NEGATIVE

## 2017-12-12 NOTE — Progress Notes (Signed)
Requested any cardiac test , ekg, cardiac clearance note from Dr. Algie Coffer.

## 2017-12-13 NOTE — Progress Notes (Signed)
Anesthesia Chart Review: Patient is a 62 year old male scheduled for right TKA on 12/24/17 by Dr. Margarita Rana. Procedure was initially scheduled for 10/08/17, but was cancelled when he arrived with a BP 172/118. He admitted to not taking amlodipine and metoprolol since his prescription ran out a month prior.   History includes smoking, HTN, hypercholesterolemia, bronchitis, dyspnea, arthritis, left axillary abscess s/p I&D 09/04/17. He did not report a history of CKD, but labs from PAT and Dr. Roseanne Kaufman office are consistent with CKD stage 3A.  PCP is Dr. Orpah Cobb (cardiologist). Last office visit 12/09/17 for HTN follow-up. Patient was back on amlodipine. BP 152/90. Dr. Algie Coffer put him back on Lopressor and continued amlodipine, clonidine, losartan, and hydralazine.  He felt patient "May undergo right knee surgery."  Meds include albuterol, amlodipine, clonidine, hydralazine, losartan, Lopressor, fish oil, pravastatin.  BP 128/86   Pulse 68   Temp 36.6 C   Resp 20   Ht 6' (1.829 m)   Wt 222 lb 3.2 oz (100.8 kg)   SpO2 100%   BMI 30.14 kg/m   EKG 12/12/17: SB at 59 bpm.  Echo 09/24/17 ([facility]): Normal LV size and systolic function with EF 60%. Mild diastolic dysfunction. Normal RV size and systolic function. Normal LA and RA size. Normal aortic valve, mitral valve, pulmonary valve, and tricuspid valve. Minimal MR and TR.    Preoperative labs noted. BUN 17, Cr 1.68, previously 19/1.60 on 12/09/16 and 20/1.78 on 09/27/17 and 18/1.60 (at Dr. Roseanne Kaufman). H/H 11.9/37.78, PLT 225. Glucose 86.   Records indicate that he is back on all of his prescribed BP medications. BP 128/86 at PAT. He had recent reevaluation by Dr. Algie Coffer who felt he could proceed with planned surgery. Based on currently available information, I anticipate that he can proceed as planned if no acute changes. He will get vitals on arrival the day of surgery. Anesthesia type is posted as Choice.  Velna Ochs [facility] Short Stay Center/Anesthesiology Phone [phone] 12/13/2017 4:23 PM

## 2017-12-23 MED ORDER — TRANEXAMIC ACID 1000 MG/10ML IV SOLN
1000.0000 mg | INTRAVENOUS | Status: AC
  Administered 2017-12-24: 1000 mg via INTRAVENOUS
  Filled 2017-12-23: qty 1100

## 2017-12-23 MED ORDER — ACETAMINOPHEN 500 MG PO TABS
1000.0000 mg | ORAL_TABLET | ORAL | Status: AC
  Administered 2017-12-24: 1000 mg via ORAL
  Filled 2017-12-23: qty 2

## 2017-12-23 MED ORDER — LACTATED RINGERS IV SOLN
INTRAVENOUS | Status: DC
  Administered 2017-12-24 (×2): via INTRAVENOUS

## 2017-12-23 MED ORDER — BUPIVACAINE LIPOSOME 1.3 % IJ SUSP
20.0000 mL | INTRAMUSCULAR | Status: DC
  Filled 2017-12-23: qty 20

## 2017-12-23 MED ORDER — CEFAZOLIN SODIUM-DEXTROSE 2-4 GM/100ML-% IV SOLN
2.0000 g | INTRAVENOUS | Status: AC
  Administered 2017-12-24: 2 g via INTRAVENOUS
  Filled 2017-12-23: qty 100

## 2017-12-23 MED ORDER — GABAPENTIN 300 MG PO CAPS
300.0000 mg | ORAL_CAPSULE | ORAL | Status: AC
  Administered 2017-12-24: 300 mg via ORAL
  Filled 2017-12-23: qty 1

## 2017-12-24 ENCOUNTER — Other Ambulatory Visit: Payer: Self-pay

## 2017-12-24 ENCOUNTER — Observation Stay (HOSPITAL_COMMUNITY)
Admission: RE | Admit: 2017-12-24 | Discharge: 2017-12-26 | Disposition: A | Discharge status: Home Health | Payer: Medicaid Other | Source: Ambulatory Visit | Attending: Orthopedic Surgery | Admitting: Orthopedic Surgery

## 2017-12-24 ENCOUNTER — Inpatient Hospital Stay (HOSPITAL_COMMUNITY): Payer: Medicaid Other | Admitting: Anesthesiology

## 2017-12-24 ENCOUNTER — Encounter (HOSPITAL_COMMUNITY)
Admission: RE | Disposition: A | Discharge status: Home Health | Payer: Self-pay | Source: Ambulatory Visit | Attending: Orthopedic Surgery

## 2017-12-24 ENCOUNTER — Observation Stay (HOSPITAL_COMMUNITY): Payer: Medicaid Other

## 2017-12-24 ENCOUNTER — Inpatient Hospital Stay (HOSPITAL_COMMUNITY): Payer: Medicaid Other | Admitting: Vascular Surgery

## 2017-12-24 ENCOUNTER — Encounter (HOSPITAL_COMMUNITY): Payer: Self-pay | Admitting: *Deleted

## 2017-12-24 DIAGNOSIS — I10 Essential (primary) hypertension: Secondary | ICD-10-CM | POA: Diagnosis present

## 2017-12-24 DIAGNOSIS — Z79899 Other long term (current) drug therapy: Secondary | ICD-10-CM | POA: Diagnosis not present

## 2017-12-24 DIAGNOSIS — M1710 Unilateral primary osteoarthritis, unspecified knee: Secondary | ICD-10-CM | POA: Diagnosis present

## 2017-12-24 DIAGNOSIS — E7800 Pure hypercholesterolemia, unspecified: Secondary | ICD-10-CM | POA: Diagnosis not present

## 2017-12-24 DIAGNOSIS — Z96652 Presence of left artificial knee joint: Secondary | ICD-10-CM | POA: Diagnosis not present

## 2017-12-24 DIAGNOSIS — M1712 Unilateral primary osteoarthritis, left knee: Secondary | ICD-10-CM | POA: Diagnosis not present

## 2017-12-24 DIAGNOSIS — J42 Unspecified chronic bronchitis: Secondary | ICD-10-CM | POA: Diagnosis not present

## 2017-12-24 DIAGNOSIS — Z96659 Presence of unspecified artificial knee joint: Secondary | ICD-10-CM

## 2017-12-24 DIAGNOSIS — F17200 Nicotine dependence, unspecified, uncomplicated: Secondary | ICD-10-CM | POA: Diagnosis present

## 2017-12-24 HISTORY — DX: Unspecified chronic bronchitis: J42

## 2017-12-24 HISTORY — PX: TOTAL KNEE ARTHROPLASTY: SHX125

## 2017-12-24 SURGERY — ARTHROPLASTY, KNEE, TOTAL
Anesthesia: Spinal | Site: Knee | Laterality: Left

## 2017-12-24 MED ORDER — PHENOL 1.4 % MT LIQD: 1.0000 | OROMUCOSAL | Status: DC | PRN

## 2017-12-24 MED ORDER — ACETAMINOPHEN 500 MG PO TABS: 1000.0000 mg | ORAL_TABLET | Freq: Three times a day (TID) | ORAL | 0 refills | Status: AC

## 2017-12-24 MED ORDER — MENTHOL 3 MG MT LOZG: 1.0000 | LOZENGE | OROMUCOSAL | Status: DC | PRN

## 2017-12-24 MED ORDER — HYDROMORPHONE HCL 1 MG/ML IJ SOLN: 0.2500 mg | INTRAMUSCULAR | Status: DC | PRN

## 2017-12-24 MED ORDER — BUPIVACAINE LIPOSOME 1.3 % IJ SUSP
INTRAMUSCULAR | Status: DC | PRN
  Administered 2017-12-24: 50 mL

## 2017-12-24 MED ORDER — DOCUSATE SODIUM 100 MG PO CAPS
100.0000 mg | ORAL_CAPSULE | Freq: Two times a day (BID) | ORAL | Status: DC
  Administered 2017-12-24 – 2017-12-26 (×4): 100 mg via ORAL
  Filled 2017-12-24 (×4): qty 1

## 2017-12-24 MED ORDER — METOPROLOL TARTRATE 100 MG PO TABS
100.0000 mg | ORAL_TABLET | Freq: Two times a day (BID) | ORAL | Status: DC
  Administered 2017-12-25 – 2017-12-26 (×3): 100 mg via ORAL
  Filled 2017-12-24 (×4): qty 1

## 2017-12-24 MED ORDER — FENTANYL CITRATE (PF) 100 MCG/2ML IJ SOLN: 25.0000 ug | INTRAMUSCULAR | Status: DC | PRN

## 2017-12-24 MED ORDER — LOSARTAN POTASSIUM 50 MG PO TABS
100.0000 mg | ORAL_TABLET | Freq: Every day | ORAL | Status: DC
  Administered 2017-12-24 – 2017-12-26 (×3): 100 mg via ORAL
  Filled 2017-12-24 (×3): qty 2

## 2017-12-24 MED ORDER — ONDANSETRON HCL 4 MG PO TABS: 4.0000 mg | ORAL_TABLET | Freq: Four times a day (QID) | ORAL | Status: DC | PRN

## 2017-12-24 MED ORDER — FENTANYL CITRATE (PF) 250 MCG/5ML IJ SOLN
INTRAMUSCULAR | Status: AC
  Filled 2017-12-24: qty 5

## 2017-12-24 MED ORDER — CHLORHEXIDINE GLUCONATE 4 % EX LIQD: 60.0000 mL | Freq: Once | CUTANEOUS | Status: DC

## 2017-12-24 MED ORDER — OXYCODONE HCL 5 MG/5ML PO SOLN: 5.0000 mg | Freq: Once | ORAL | Status: AC | PRN

## 2017-12-24 MED ORDER — ONDANSETRON HCL 4 MG/2ML IJ SOLN: 4.0000 mg | Freq: Once | INTRAMUSCULAR | Status: DC | PRN

## 2017-12-24 MED ORDER — AMLODIPINE BESYLATE 5 MG PO TABS
5.0000 mg | ORAL_TABLET | Freq: Two times a day (BID) | ORAL | Status: DC
  Administered 2017-12-24 – 2017-12-26 (×4): 5 mg via ORAL
  Filled 2017-12-24 (×4): qty 1

## 2017-12-24 MED ORDER — PRAVASTATIN SODIUM 40 MG PO TABS
40.0000 mg | ORAL_TABLET | Freq: Every day | ORAL | Status: DC
  Administered 2017-12-24 – 2017-12-26 (×3): 40 mg via ORAL
  Filled 2017-12-24 (×3): qty 1

## 2017-12-24 MED ORDER — CEFAZOLIN SODIUM-DEXTROSE 1-4 GM/50ML-% IV SOLN
1.0000 g | Freq: Four times a day (QID) | INTRAVENOUS | Status: AC
  Administered 2017-12-24 (×2): 1 g via INTRAVENOUS
  Filled 2017-12-24 (×2): qty 50

## 2017-12-24 MED ORDER — RIVAROXABAN 10 MG PO TABS
10.0000 mg | ORAL_TABLET | Freq: Every day | ORAL | Status: DC
  Administered 2017-12-25 – 2017-12-26 (×2): 10 mg via ORAL
  Filled 2017-12-24 (×2): qty 1

## 2017-12-24 MED ORDER — PROPOFOL 10 MG/ML IV BOLUS
INTRAVENOUS | Status: DC | PRN
  Administered 2017-12-24: 20 mg via INTRAVENOUS

## 2017-12-24 MED ORDER — ONDANSETRON HCL 4 MG/2ML IJ SOLN
INTRAMUSCULAR | Status: DC | PRN
  Administered 2017-12-24: 4 mg via INTRAVENOUS

## 2017-12-24 MED ORDER — PHENYLEPHRINE HCL 10 MG/ML IJ SOLN
INTRAMUSCULAR | Status: DC | PRN
  Administered 2017-12-24: 20 ug/min via INTRAVENOUS

## 2017-12-24 MED ORDER — ALBUTEROL SULFATE (2.5 MG/3ML) 0.083% IN NEBU: 2.5000 mg | INHALATION_SOLUTION | Freq: Four times a day (QID) | RESPIRATORY_TRACT | Status: DC | PRN

## 2017-12-24 MED ORDER — PROPOFOL 1000 MG/100ML IV EMUL
INTRAVENOUS | Status: AC
  Filled 2017-12-24: qty 300

## 2017-12-24 MED ORDER — OXYCODONE HCL 5 MG/5ML PO SOLN: 5.0000 mg | Freq: Once | ORAL | Status: DC | PRN

## 2017-12-24 MED ORDER — OXYCODONE HCL 5 MG PO TABS
5.0000 mg | ORAL_TABLET | Freq: Once | ORAL | Status: AC | PRN
  Administered 2017-12-24: 5 mg via ORAL

## 2017-12-24 MED ORDER — PROPOFOL 500 MG/50ML IV EMUL
INTRAVENOUS | Status: DC | PRN
  Administered 2017-12-24: 75 ug/kg/min via INTRAVENOUS

## 2017-12-24 MED ORDER — OXYCODONE HCL 5 MG PO TABS: 5.0000 mg | ORAL_TABLET | ORAL | Status: DC | PRN

## 2017-12-24 MED ORDER — METOCLOPRAMIDE HCL 5 MG PO TABS: 5.0000 mg | ORAL_TABLET | Freq: Three times a day (TID) | ORAL | Status: DC | PRN

## 2017-12-24 MED ORDER — METHOCARBAMOL 500 MG PO TABS: 500.0000 mg | ORAL_TABLET | Freq: Four times a day (QID) | ORAL | 0 refills | Status: DC | PRN

## 2017-12-24 MED ORDER — LACTATED RINGERS IV SOLN
INTRAVENOUS | Status: DC
  Administered 2017-12-24 – 2017-12-25 (×3): via INTRAVENOUS

## 2017-12-24 MED ORDER — ONDANSETRON HCL 4 MG/2ML IJ SOLN
INTRAMUSCULAR | Status: AC
  Filled 2017-12-24: qty 2

## 2017-12-24 MED ORDER — OXYCODONE HCL 5 MG PO TABS: 5.0000 mg | ORAL_TABLET | Freq: Once | ORAL | Status: DC | PRN

## 2017-12-24 MED ORDER — ALBUTEROL SULFATE HFA 108 (90 BASE) MCG/ACT IN AERS: 1.0000 | INHALATION_SPRAY | Freq: Four times a day (QID) | RESPIRATORY_TRACT | Status: DC | PRN

## 2017-12-24 MED ORDER — CLONIDINE HCL 0.2 MG PO TABS
0.2000 mg | ORAL_TABLET | Freq: Every day | ORAL | Status: DC
  Administered 2017-12-24 – 2017-12-25 (×2): 0.2 mg via ORAL
  Filled 2017-12-24 (×2): qty 1

## 2017-12-24 MED ORDER — SODIUM CHLORIDE 0.9 % IR SOLN
Status: DC | PRN
  Administered 2017-12-24: 3000 mL

## 2017-12-24 MED ORDER — METHOCARBAMOL 1000 MG/10ML IJ SOLN
500.0000 mg | Freq: Four times a day (QID) | INTRAVENOUS | Status: DC | PRN
  Filled 2017-12-24: qty 5

## 2017-12-24 MED ORDER — METHOCARBAMOL 500 MG PO TABS
500.0000 mg | ORAL_TABLET | Freq: Four times a day (QID) | ORAL | Status: DC | PRN
  Administered 2017-12-24 – 2017-12-26 (×5): 500 mg via ORAL
  Filled 2017-12-24 (×5): qty 1

## 2017-12-24 MED ORDER — BUPIVACAINE IN DEXTROSE 0.75-8.25 % IT SOLN
INTRATHECAL | Status: DC | PRN
  Administered 2017-12-24: 15 mg via INTRATHECAL

## 2017-12-24 MED ORDER — FENTANYL CITRATE (PF) 100 MCG/2ML IJ SOLN
100.0000 ug | Freq: Once | INTRAMUSCULAR | Status: AC
  Administered 2017-12-24: 100 ug via INTRAVENOUS

## 2017-12-24 MED ORDER — HYDRALAZINE HCL 25 MG PO TABS
25.0000 mg | ORAL_TABLET | Freq: Three times a day (TID) | ORAL | Status: DC
  Administered 2017-12-24 – 2017-12-26 (×6): 25 mg via ORAL
  Filled 2017-12-24 (×6): qty 1

## 2017-12-24 MED ORDER — OXYCODONE HCL 5 MG PO TABS
ORAL_TABLET | ORAL | Status: AC
  Filled 2017-12-24: qty 1

## 2017-12-24 MED ORDER — OXYCODONE HCL 5 MG PO TABS
10.0000 mg | ORAL_TABLET | ORAL | Status: DC | PRN
  Administered 2017-12-24 – 2017-12-26 (×9): 10 mg via ORAL
  Filled 2017-12-24 (×9): qty 2

## 2017-12-24 MED ORDER — RIVAROXABAN 10 MG PO TABS: 10.0000 mg | ORAL_TABLET | Freq: Every day | ORAL | 0 refills | Status: DC

## 2017-12-24 MED ORDER — ACETAMINOPHEN 500 MG PO TABS
1000.0000 mg | ORAL_TABLET | Freq: Three times a day (TID) | ORAL | Status: DC
  Administered 2017-12-24 – 2017-12-26 (×7): 1000 mg via ORAL
  Filled 2017-12-24 (×7): qty 2

## 2017-12-24 MED ORDER — EPHEDRINE SULFATE 50 MG/ML IJ SOLN
INTRAMUSCULAR | Status: DC | PRN
  Administered 2017-12-24: 5 mg via INTRAVENOUS

## 2017-12-24 MED ORDER — ONDANSETRON HCL 4 MG PO TABS: 4.0000 mg | ORAL_TABLET | Freq: Three times a day (TID) | ORAL | 0 refills | Status: DC | PRN

## 2017-12-24 MED ORDER — METOCLOPRAMIDE HCL 5 MG/ML IJ SOLN: 5.0000 mg | Freq: Three times a day (TID) | INTRAMUSCULAR | Status: DC | PRN

## 2017-12-24 MED ORDER — OXYCODONE HCL 5 MG PO TABS: 5.0000 mg | ORAL_TABLET | ORAL | 0 refills | Status: AC | PRN

## 2017-12-24 MED ORDER — MIDAZOLAM HCL 2 MG/2ML IJ SOLN
1.0000 mg | Freq: Once | INTRAMUSCULAR | Status: AC
Start: 2017-12-24 — End: 2017-12-24
  Administered 2017-12-24: 1 mg via INTRAVENOUS

## 2017-12-24 MED ORDER — POLYETHYLENE GLYCOL 3350 17 G PO PACK: 17.0000 g | PACK | Freq: Every day | ORAL | Status: DC | PRN

## 2017-12-24 MED ORDER — MIDAZOLAM HCL 2 MG/2ML IJ SOLN
INTRAMUSCULAR | Status: AC
  Filled 2017-12-24: qty 2

## 2017-12-24 MED ORDER — DOCUSATE SODIUM 100 MG PO CAPS: 100.0000 mg | ORAL_CAPSULE | Freq: Two times a day (BID) | ORAL | 0 refills | Status: DC

## 2017-12-24 MED ORDER — SORBITOL 70 % SOLN: 30.0000 mL | Freq: Every day | Status: DC | PRN

## 2017-12-24 MED ORDER — POVIDONE-IODINE 10 % EX SWAB
2.0000 "application " | Freq: Once | CUTANEOUS | Status: AC
  Administered 2017-12-24: 2 via TOPICAL

## 2017-12-24 MED ORDER — DIPHENHYDRAMINE HCL 12.5 MG/5ML PO ELIX: 12.5000 mg | ORAL_SOLUTION | ORAL | Status: DC | PRN

## 2017-12-24 MED ORDER — METHOCARBAMOL 500 MG PO TABS
ORAL_TABLET | ORAL | Status: AC
  Filled 2017-12-24: qty 1

## 2017-12-24 MED ORDER — DEXAMETHASONE SODIUM PHOSPHATE 10 MG/ML IJ SOLN
10.0000 mg | Freq: Once | INTRAMUSCULAR | Status: AC
  Administered 2017-12-25: 10 mg via INTRAVENOUS
  Filled 2017-12-24: qty 1

## 2017-12-24 MED ORDER — ONDANSETRON HCL 4 MG/2ML IJ SOLN: 4.0000 mg | Freq: Four times a day (QID) | INTRAMUSCULAR | Status: DC | PRN

## 2017-12-24 MED ORDER — HYDROMORPHONE HCL 1 MG/ML IJ SOLN: 0.5000 mg | INTRAMUSCULAR | Status: DC | PRN

## 2017-12-24 MED ORDER — 0.9 % SODIUM CHLORIDE (POUR BTL) OPTIME
TOPICAL | Status: DC | PRN
  Administered 2017-12-24: 1000 mL

## 2017-12-24 MED ORDER — ROPIVACAINE HCL 7.5 MG/ML IJ SOLN
INTRAMUSCULAR | Status: DC | PRN
  Administered 2017-12-24: 20 mL via PERINEURAL

## 2017-12-24 MED ORDER — FENTANYL CITRATE (PF) 100 MCG/2ML IJ SOLN
INTRAMUSCULAR | Status: AC
  Filled 2017-12-24: qty 2

## 2017-12-24 SURGICAL SUPPLY — 64 items
BANDAGE ESMARK 6X9 LF (GAUZE/BANDAGES/DRESSINGS) ×1 IMPLANT
BEARING TIBIAL INSERT SZ7 (Orthopedic Implant) ×1 IMPLANT
BLADE SAG 18X100X1.27 (BLADE) ×6 IMPLANT
BNDG CMPR 9X6 STRL LF SNTH (GAUZE/BANDAGES/DRESSINGS) ×1
BNDG CMPR MED 10X6 ELC LF (GAUZE/BANDAGES/DRESSINGS) ×1
BNDG CMPR MED 15X6 ELC VLCR LF (GAUZE/BANDAGES/DRESSINGS) ×1
BNDG ELASTIC 6X10 VLCR STRL LF (GAUZE/BANDAGES/DRESSINGS) ×3 IMPLANT
BNDG ELASTIC 6X15 VLCR STRL LF (GAUZE/BANDAGES/DRESSINGS) ×3 IMPLANT
BNDG ESMARK 6X9 LF (GAUZE/BANDAGES/DRESSINGS) ×3
BOWL SMART MIX CTS (DISPOSABLE) IMPLANT
BSPLAT TIB 7 KN TRITANIUM (Knees) ×1 IMPLANT
CLOSURE STERI-STRIP 1/2X4 (GAUZE/BANDAGES/DRESSINGS) ×1
CLSR STERI-STRIP ANTIMIC 1/2X4 (GAUZE/BANDAGES/DRESSINGS) ×2 IMPLANT
COVER SURGICAL LIGHT HANDLE (MISCELLANEOUS) ×3 IMPLANT
CUFF TOURNIQUET SINGLE 34IN LL (TOURNIQUET CUFF) ×3 IMPLANT
DRAPE EXTREMITY T 121X128X90 (DRAPE) ×3 IMPLANT
DRAPE HALF SHEET 40X57 (DRAPES) ×3 IMPLANT
DRAPE U-SHAPE 47X51 STRL (DRAPES) ×3 IMPLANT
DRSG MEPILEX BORDER 4X12 (GAUZE/BANDAGES/DRESSINGS) ×3 IMPLANT
DRSG MEPILEX BORDER 4X8 (GAUZE/BANDAGES/DRESSINGS) ×3 IMPLANT
DURAPREP 26ML APPLICATOR (WOUND CARE) ×6 IMPLANT
ELECT CAUTERY BLADE 6.4 (BLADE) ×3 IMPLANT
ELECT REM PT RETURN 9FT ADLT (ELECTROSURGICAL) ×3
ELECTRODE REM PT RTRN 9FT ADLT (ELECTROSURGICAL) ×1 IMPLANT
FACESHIELD WRAPAROUND (MASK) ×6 IMPLANT
FEMORAL POSTERIOR SZ6 LFT (Femur) ×1 IMPLANT
GLOVE BIO SURGEON STRL SZ7.5 (GLOVE) ×6 IMPLANT
GLOVE BIOGEL PI IND STRL 7.0 (GLOVE) ×1 IMPLANT
GLOVE BIOGEL PI IND STRL 8 (GLOVE) ×2 IMPLANT
GLOVE BIOGEL PI INDICATOR 7.0 (GLOVE) ×2
GLOVE BIOGEL PI INDICATOR 8 (GLOVE) ×4
GOWN STRL REUS W/ TWL LRG LVL3 (GOWN DISPOSABLE) ×2 IMPLANT
GOWN STRL REUS W/ TWL XL LVL3 (GOWN DISPOSABLE) ×1 IMPLANT
GOWN STRL REUS W/TWL LRG LVL3 (GOWN DISPOSABLE) ×6
GOWN STRL REUS W/TWL XL LVL3 (GOWN DISPOSABLE) ×3
HANDPIECE INTERPULSE COAX TIP (DISPOSABLE) ×3
IMMOBILIZER KNEE 22 UNIV (SOFTGOODS) ×3 IMPLANT
IMMOBILIZER KNEE 24 THIGH 36 (MISCELLANEOUS) ×1 IMPLANT
IMMOBILIZER KNEE 24 UNIV (MISCELLANEOUS) ×3
KIT BASIN OR (CUSTOM PROCEDURE TRAY) ×3 IMPLANT
KIT ROOM TURNOVER OR (KITS) ×3 IMPLANT
KNEE PATELLA ASYMMETRIC 10X32 (Knees) ×3 IMPLANT
KNEE TIBIAL COMPONENT SZ7 (Knees) ×3 IMPLANT
MANIFOLD NEPTUNE II (INSTRUMENTS) ×3 IMPLANT
NEEDLE 22X1 1/2 (OR ONLY) (NEEDLE) ×3 IMPLANT
NS IRRIG 1000ML POUR BTL (IV SOLUTION) ×3 IMPLANT
PACK TOTAL JOINT (CUSTOM PROCEDURE TRAY) ×3 IMPLANT
PAD ARMBOARD 7.5X6 YLW CONV (MISCELLANEOUS) ×3 IMPLANT
POSTERIOR FEMORAL SZ6 LFT (Femur) ×3 IMPLANT
SET HNDPC FAN SPRY TIP SCT (DISPOSABLE) ×1 IMPLANT
SUCTION FRAZIER HANDLE 10FR (MISCELLANEOUS) ×2
SUCTION TUBE FRAZIER 10FR DISP (MISCELLANEOUS) ×1 IMPLANT
SUT MNCRL AB 4-0 PS2 18 (SUTURE) ×3 IMPLANT
SUT MON AB 2-0 CT1 27 (SUTURE) ×3 IMPLANT
SUT VIC AB 0 CT1 27 (SUTURE) ×6
SUT VIC AB 0 CT1 27XBRD ANBCTR (SUTURE) ×2 IMPLANT
SUT VIC AB 1 CT1 27 (SUTURE) ×9
SUT VIC AB 1 CT1 27XBRD ANBCTR (SUTURE) ×3 IMPLANT
SYR 50ML LL SCALE MARK (SYRINGE) ×3 IMPLANT
TIBIAL BEARING INSERT SZ7 (Orthopedic Implant) ×3 IMPLANT
TOWEL OR 17X24 6PK STRL BLUE (TOWEL DISPOSABLE) ×3 IMPLANT
TOWEL OR 17X26 10 PK STRL BLUE (TOWEL DISPOSABLE) ×3 IMPLANT
TRAY CATH 16FR W/PLASTIC CATH (SET/KITS/TRAYS/PACK) ×6 IMPLANT
TRAY FOLEY BAG SILVER LF 14FR (CATHETERS) IMPLANT

## 2017-12-24 NOTE — Anesthesia Procedure Notes (Signed)
Procedure Name: MAC Date/Time: 12/24/2017 11:55 AM Performed by: Leonor Liv, CRNA Pre-anesthesia Checklist: Patient identified, Emergency Drugs available, Suction available, Patient being monitored and Timeout performed Oxygen Delivery Method: Simple face mask Placement Confirmation: positive ETCO2

## 2017-12-24 NOTE — Progress Notes (Signed)
Orthopedic Tech Progress Note Patient Details:  Phillip Kennedy [birth year] [id number]  CPM Left Knee CPM Left Knee: On Left Knee Flexion (Degrees): 90 Left Knee Extension (Degrees): 0  Post Interventions Patient Tolerated: Well Instructions Provided: Care of device Ortho Devices Type of Ortho Device: Bone foam zero knee Ortho Device/Splint Interventions: Ordered, Application, Adjustment   Post Interventions Patient Tolerated: Well Instructions Provided: Care of device   [name] 12/24/2017, 4:03 PM

## 2017-12-24 NOTE — Transfer of Care (Signed)
Immediate Anesthesia Transfer of Care Note  Patient: Phillip Kennedy  Procedure(s) Performed: LEFT TOTAL KNEE ARTHROPLASTY (Left Knee)  Patient Location: PACU  Anesthesia Type:Spinal  Level of Consciousness: awake, alert  and oriented  Airway & Oxygen Therapy: Patient Spontanous Breathing and Patient connected to nasal cannula oxygen  Post-op Assessment: Report given to RN and Post -op Vital signs reviewed and stable  Post vital signs: Reviewed and stable  Last Vitals:  Vitals:   12/24/17 1120 12/24/17 1125  BP: 99/72 127/77  Pulse: (!) 54 (!) 55  Resp: 16 16  Temp:    SpO2: 100% 100%    Last Pain:  Vitals:   12/24/17 1016  TempSrc: Oral      Patients Stated Pain Goal: 3 (12/24/17 1021)  Complications: No apparent anesthesia complications

## 2017-12-24 NOTE — Interval H&P Note (Signed)
History and Physical Interval Note:  12/24/2017 10:17 AM  Phillip Kennedy  has presented today for surgery, with the diagnosis of OA RIGHT KNEE  The various methods of treatment have been discussed with the patient and family. After consideration of risks, benefits and other options for treatment, the patient has consented to  Procedure(s): LEFT TOTAL KNEE ARTHROPLASTY (Left) as a surgical intervention .  The patient's history has been reviewed, patient examined, no change in status, stable for surgery.  I have reviewed the patient's chart and labs.  Questions were answered to the patient's satisfaction.     Alexx Giambra D

## 2017-12-24 NOTE — Anesthesia Preprocedure Evaluation (Addendum)
Anesthesia Evaluation  Patient identified by MRN, date of birth, ID band Patient awake    Reviewed: Allergy & Precautions, H&P , NPO status , Patient's Chart, lab work & pertinent test results  Airway Mallampati: II  TM Distance: >3 FB Neck ROM: Full    Dental no notable dental hx. (+) Edentulous Upper, Partial Lower, Dental Advisory Given   Pulmonary Current Smoker,    Pulmonary exam normal breath sounds clear to auscultation       Cardiovascular hypertension, Pt. on medications and Pt. on home beta blockers  Rhythm:Regular Rate:Normal     Neuro/Psych negative neurological ROS  negative psych ROS   GI/Hepatic negative GI ROS, Neg liver ROS,   Endo/Other  negative endocrine ROS  Renal/GU negative Renal ROS  negative genitourinary   Musculoskeletal  (+) Arthritis , Osteoarthritis,    Abdominal   Peds  Hematology negative hematology ROS (+)   Anesthesia Other Findings   Reproductive/Obstetrics negative OB ROS                            Anesthesia Physical Anesthesia Plan  ASA: II  Anesthesia Plan: Spinal   Post-op Pain Management:  Regional for Post-op pain   Induction: Intravenous  PONV Risk Score and Plan: 1 and Propofol infusion, Ondansetron, Dexamethasone and Midazolam  Airway Management Planned: Simple Face Mask  Additional Equipment:   Intra-op Plan:   Post-operative Plan:   Informed Consent: I have reviewed the patients History and Physical, chart, labs and discussed the procedure including the risks, benefits and alternatives for the proposed anesthesia with the patient or authorized representative who has indicated his/her understanding and acceptance.   Dental advisory given  Plan Discussed with: CRNA  Anesthesia Plan Comments:         Anesthesia Quick Evaluation

## 2017-12-24 NOTE — Anesthesia Procedure Notes (Signed)
Anesthesia Regional Block: Adductor canal block   Pre-Anesthetic Checklist: ,, timeout performed, Correct Patient, Correct Site, Correct Laterality, Correct Procedure, Correct Position, site marked, Risks and benefits discussed, pre-op evaluation,  At surgeon's request and post-op pain management  Laterality: Left  Prep: Maximum Sterile Barrier Precautions used, chloraprep       Needles:  Injection technique: Single-shot  Needle Type: Echogenic Stimulator Needle     Needle Length: 9cm  Needle Gauge: 21     Additional Needles:   Procedures:,,,, ultrasound used (permanent image in chart),,,,  Narrative:  Start time: 12/24/2017 10:55 AM End time: 12/24/2017 11:05 AM Injection made incrementally with aspirations every 5 mL.  Performed by: Personally  Anesthesiologist: Gaynelle Adu, MD  Additional Notes: 2% Lidocaine skin wheel.

## 2017-12-24 NOTE — Anesthesia Procedure Notes (Signed)
Spinal  Patient location during procedure: OR Start time: 12/24/2017 11:58 AM End time: 12/24/2017 12:10 PM Staffing Anesthesiologist: Gaynelle Adu, MD Performed: anesthesiologist  Preanesthetic Checklist Completed: patient identified, surgical consent, pre-op evaluation, timeout performed, IV checked, risks and benefits discussed and monitors and equipment checked Spinal Block Patient position: sitting Prep: DuraPrep Patient monitoring: cardiac monitor, continuous pulse ox and blood pressure Approach: midline Location: L4-5 Injection technique: single-shot Needle Needle type: Quincke (Attempted 24G Pencan. Unable to locate space.)  Needle gauge: 22 G Needle length: 9 cm Assessment Sensory level: T8 Additional Notes Functioning IV was confirmed and monitors were applied. Sterile prep and drape, including hand hygiene and sterile gloves were used. The patient was positioned and the spine was prepped. The skin was anesthetized with lidocaine.  Free flow of clear CSF was obtained prior to injecting local anesthetic into the CSF.  The spinal needle aspirated freely following injection.  The needle was carefully withdrawn.  The patient tolerated the procedure well.

## 2017-12-24 NOTE — Op Note (Signed)
DATE OF SURGERY:  12/24/2017 TIME: 1:41 PM  [name]   AGE: 62 y.o.    PRE-OPERATIVE DIAGNOSIS:  OA RIGHT KNEE  POST-OPERATIVE DIAGNOSIS:  Same  PROCEDURE:  Procedure(s): LEFT TOTAL KNEE ARTHROPLASTY   SURGEON:  Pria Klosinski D, MD   ASSISTANT:  Aquilla Hacker, PA-C, he was present and scrubbed throughout the case, critical for completion in a timely fashion, and for retraction, instrumentation, and closure.    OPERATIVE IMPLANTS: [employer] Triathlon Posterior Stabilized. Press fit knee  Femur size 6, Tibia size 7, Patella size 32 3-peg oval button, with a 9 mm polyethylene insert.   PREOPERATIVE INDICATIONS:  Phillip Kennedy is a 62 y.o. year old male with end stage bone on bone degenerative arthritis of the knee who failed conservative treatment, including injections, antiinflammatories, activity modification, and assistive devices, and had significant impairment of their activities of daily living, and elected for Total Knee Arthroplasty.   The risks, benefits, and alternatives were discussed at length including but not limited to the risks of infection, bleeding, nerve injury, stiffness, blood clots, the need for revision surgery, cardiopulmonary complications, among others, and they were willing to proceed.   OPERATIVE DESCRIPTION:  The patient was brought to the operative room and placed in a supine position.  General anesthesia was administered.  IV antibiotics were given.  The lower extremity was prepped and draped in the usual sterile fashion.  Time out was performed.  The leg was elevated and exsanguinated and the tourniquet was inflated.  Anterior approach was performed.  The patella was everted and osteophytes were removed.  The anterior horn of the medial and lateral meniscus was removed.   The distal femur was opened with the drill and the intramedullary distal femoral cutting jig was utilized, set at 5 degrees resecting 10 mm off the distal  femur.  Care was taken to protect the collateral ligaments.  The distal femoral sizing jig was applied, taking care to avoid notching.  Then the 4-in-1 cutting jig was applied and the anterior and posterior femur was cut, along with the chamfer cuts.  All posterior osteophytes were removed.  The flexion gap was then measured and was symmetric with the extension gap.  Then the extramedullary tibial cutting jig was utilized making the appropriate cut using the anterior tibial crest as a reference building in appropriate posterior slope.  Care was taken during the cut to protect the medial and collateral ligaments.  The proximal tibia was removed along with the posterior horns of the menisci.  The PCL was sacrificed.    The extensor gap was measured and was approximately 70mm.    I completed the distal femoral preparation using the appropriate jig to prepare the box.  The patella was then measured, and cut with the saw.    The proximal tibia sized and prepared accordingly with the reamer and the punch, and then all components were trialed with the above sized poly insert.  The knee was found to have excellent balance and full motion.    The above named components were then impacted into place and Poly tibial piece and patella were inserted.  I was very happy with his stability and ROM  I performed a periarticular injection with marcaine and toradol  The knee was easily taken through a range of motion and the patella tracked well and the knee irrigated copiously and the parapatellar and subcutaneous tissue closed with vicryl, and monocryl with steri strips for the skin.  The incision was dressed with sterile gauze and the tourniquet released and the patient was awakened and returned to the PACU in stable and satisfactory condition.  There were no complications.  Total tourniquet time was roughly 75 minutes.   POSTOPERATIVE PLAN: post op Abx, DVT px: SCD's, TED's, Early ambulation and chemical  px

## 2017-12-25 ENCOUNTER — Encounter (HOSPITAL_COMMUNITY): Payer: Self-pay | Admitting: Orthopedic Surgery

## 2017-12-25 DIAGNOSIS — M1712 Unilateral primary osteoarthritis, left knee: Secondary | ICD-10-CM | POA: Diagnosis not present

## 2017-12-25 NOTE — Progress Notes (Signed)
Lopressor not given at HS, HR 59.

## 2017-12-25 NOTE — Plan of Care (Signed)
  Nutrition: Adequate nutrition will be maintained 12/25/2017 1155 - Progressing by Darrow Bussing, RN   Elimination: Will not experience complications related to bowel motility 12/25/2017 1155 - Progressing by Darrow Bussing, RN   Pain Managment: General experience of comfort will improve 12/25/2017 1155 - Progressing by Darrow Bussing, RN   Safety: Ability to remain free from injury will improve 12/25/2017 1155 - Progressing by Darrow Bussing, RN

## 2017-12-25 NOTE — Progress Notes (Signed)
Physical Therapy Treatment Patient Details Name: Phillip Kennedy MRN: [id number] DOB: 27-Jul-1956 Today's Date: 12/25/2017    History of Present Illness 62 y.o. male admitted on 12/24/17 for elective L TKA. Pt with significant PMH of HTN, high cholesterol, chronic bronchitis, and arthritis.    PT Comments    Pt progressing towards functional mobility goals. Pt ambulated with RW and initiated stair training this session. Pt with improved quad activation compared to last session. Believe pt would greatly benefit from 1-2 more acute PT sessions to maximize functional mobility and safety as pt's wife does not feel able to physically assist pt at home.   Follow Up Recommendations  Follow surgeon's recommendation for DC plan and follow-up therapies;Supervision for mobility/OOB     Equipment Recommendations  None recommended by PT    Recommendations for Other Services       Precautions / Restrictions Precautions Precautions: Knee;Fall Precaution Booklet Issued: Yes (comment) Precaution Comments: exercise handout provided and reviewed. educated pt on knee precautions. Required Braces or Orthoses: Knee Immobilizer - Left Knee Immobilizer - Left: Discontinue once straight leg raise with < 10 degree lag Restrictions Weight Bearing Restrictions: Yes Other Position/Activity Restrictions: WBAT    Mobility  Bed Mobility Overal bed mobility: Needs Assistance Bed Mobility: Sit to Supine     Supine to sit: Min assist Sit to supine: Min guard   General bed mobility comments: increased time and effort required to bring LLE onto bed  Transfers Overall transfer level: Needs assistance Equipment used: Rolling walker (2 wheeled) Transfers: Sit to/from Stand Sit to Stand: Min guard         General transfer comment: required 2 attempts with heavy reliance on UEs. no physical assist required  Ambulation/Gait Ambulation/Gait assistance: Min guard Ambulation Distance (Feet): 60  Feet Assistive device: Rolling walker (2 wheeled) Gait Pattern/deviations: Step-to pattern;Antalgic;Decreased stride length Gait velocity: decreased Gait velocity interpretation: Below normal speed for age/gender General Gait Details: pt with antalgic gait and decreased WB through LLE which improved a little as he continued to walk. pt with safe management of walker. VCs for upright posture.   Stairs Stairs: Yes   Stair Management: Forwards;With walker Number of Stairs: 1 General stair comments: VCs for technique and RW managment. min assist for stability during ascending due to LLE pain  Wheelchair Mobility    Modified Rankin (Stroke Patients Only)       Balance Overall balance assessment: No apparent balance deficits (not formally assessed)                                          Cognition Arousal/Alertness: Awake/alert Behavior During Therapy: WFL for tasks assessed/performed Overall Cognitive Status: Within Functional Limits for tasks assessed                                        Exercises Total Joint Exercises Ankle Circles/Pumps: AROM;Both;20 reps Quad Sets: AROM;Left;10 reps Towel Squeeze: AROM;Both;10 reps Short Arc QuadBarbaraann Boys;Left;10 reps;AROM Heel Slides: AAROM;Left;10 reps Hip ABduction/ADduction: AROM;Left;10 reps Straight Leg Raises: AAROM;Left;10 reps Long Arc Quad: AAROM;Left;10 reps Knee Flexion: AROM;AAROM;Left;10 reps Goniometric ROM: knee: 9-50 degrees    General Comments General comments (skin integrity, edema, etc.): VSS      Pertinent Vitals/Pain Pain Assessment: 0-10 Pain Score: 8  Pain Location: left knee  and quad Pain Descriptors / Indicators: Aching;Sore Pain Intervention(s): Monitored during session;Limited activity within patient's tolerance;Repositioned;Patient requesting pain meds-RN notified;RN gave pain meds during session    Home Living Family/patient expects to be discharged to:: Private  residence Living Arrangements: Spouse/significant other Available Help at Discharge: Family;Available 24 hours/day Type of Home: House Home Access: Stairs to enter Entrance Stairs-Rails: None Home Layout: One level Home Equipment: Environmental consultant - 2 wheels      Prior Function Level of Independence: Independent          PT Goals (current goals can now be found in the care plan section) Acute Rehab PT Goals Patient Stated Goal: to get back to walking normally again PT Goal Formulation: With patient/family Time For Goal Achievement: 01/08/18 Potential to Achieve Goals: Good Progress towards PT goals: Progressing toward goals    Frequency    7X/week      PT Plan Current plan remains appropriate    Co-evaluation              AM-PAC PT "6 Clicks" Daily Activity  Outcome Measure  Difficulty turning over in bed (including adjusting bedclothes, sheets and blankets)?: A Little Difficulty moving from lying on back to sitting on the side of the bed? : A Little Difficulty sitting down on and standing up from a chair with arms (e.g., wheelchair, bedside commode, etc,.)?: A Little Help needed moving to and from a bed to chair (including a wheelchair)?: A Little Help needed walking in hospital room?: A Little Help needed climbing 3-5 steps with a railing? : A Lot 6 Click Score: 17    End of Session Equipment Utilized During Treatment: Gait belt;Left knee immobilizer Activity Tolerance: Patient tolerated treatment well;Patient limited by pain Patient left: with family/visitor present;in bed;with call bell/phone within reach Nurse Communication: Mobility status PT Visit Diagnosis: Muscle weakness (generalized) (M62.81);Difficulty in walking, not elsewhere classified (R26.2)     Time: 5366-4403 PT Time Calculation (min) (ACUTE ONLY): 22 min  Charges:  $Gait Training: 8-22 mins                    G Codes:      Barrie Dunker, SPT  Barrie Dunker 12/25/2017, 2:32 PM

## 2017-12-25 NOTE — Anesthesia Postprocedure Evaluation (Signed)
Anesthesia Post Note  Patient: Phillip Kennedy  Procedure(s) Performed: LEFT TOTAL KNEE ARTHROPLASTY (Left Knee)     Patient location during evaluation: PACU Anesthesia Type: Spinal and Regional Level of consciousness: oriented and awake and alert Pain management: pain level controlled Vital Signs Assessment: post-procedure vital signs reviewed and stable Respiratory status: spontaneous breathing and respiratory function stable Cardiovascular status: blood pressure returned to baseline and stable Postop Assessment: no headache, no backache and no apparent nausea or vomiting Anesthetic complications: no    Last Vitals:  Vitals:   12/25/17 0113 12/25/17 0502  BP: 131/86 (!) 143/82  Pulse: 60 72  Resp: 16 16  Temp: 37.2 C 37.4 C  SpO2: 99% 100%    Last Pain:  Vitals:   12/25/17 0600  TempSrc:   PainSc: 6                  Dixie Jafri,W. EDMOND

## 2017-12-25 NOTE — Progress Notes (Signed)
Pt has discharge orders but Pt did not clear him for discharge. Pain controlled by PRN PO meds. Md's office notified, waiting for callback.

## 2017-12-25 NOTE — Evaluation (Signed)
Physical Therapy Evaluation Patient Details Name: Phillip Kennedy MRN: [id number] DOB: 02/20/1956 Today's Date: 12/25/2017   History of Present Illness  62 y.o. male admitted on 12/24/17 for elective L TKA. Pt with significant PMH of HTN, high cholesterol, chronic bronchitis, and arthritis.  Clinical Impression  Pt is POD #1. Pt limited by L LE pain and mild dizziness but overall moving well. Pt able to ambulate with RW and L knee immobilizer min guard in hall. HEP program initiated and knee precautions reviewed. He will likely progress well enough to d/c home with his wife's supervision/assistance. Will continue to follow acutely and progress as tolerated for deficits listed below.        Follow Up Recommendations Follow surgeon's recommendation for DC plan and follow-up therapies;Supervision for mobility/OOB    Equipment Recommendations  None recommended by PT    Recommendations for Other Services       Precautions / Restrictions Precautions Precautions: Knee;Fall Precaution Booklet Issued: Yes (comment) Precaution Comments: exercise handout provided and reviewed. educated pt on knee precautions. Required Braces or Orthoses: Knee Immobilizer - Left Knee Immobilizer - Left: Discontinue once straight leg raise with < 10 degree lag Restrictions Weight Bearing Restrictions: Yes Other Position/Activity Restrictions: WBAT      Mobility  Bed Mobility Overal bed mobility: Needs Assistance Bed Mobility: Supine to Sit     Supine to sit: Min assist     General bed mobility comments: assist required to bring LLE off EOB  Transfers Overall transfer level: Needs assistance Equipment used: Rolling walker (2 wheeled) Transfers: Sit to/from Stand Sit to Stand: Min guard;From elevated surface         General transfer comment: pt required bed elevated to stand with heavy use of bil UEs. min guard for safety.   Ambulation/Gait Ambulation/Gait assistance: Min guard;+2  safety/equipment(+2 for chair follow due to dizziness) Ambulation Distance (Feet): 50 Feet Assistive device: Rolling walker (2 wheeled) Gait Pattern/deviations: Step-to pattern;Antalgic;Decreased stride length Gait velocity: decreased Gait velocity interpretation: Below normal speed for age/gender General Gait Details: pt with antalgic gait and decreased WB through LLE which improved a little as he continued to walk. pt with safe management of walker. VCs for upright posture.  Stairs            Wheelchair Mobility    Modified Rankin (Stroke Patients Only)       Balance Overall balance assessment: No apparent balance deficits (not formally assessed)                                           Pertinent Vitals/Pain Pain Assessment: 0-10 Pain Score: 8  Pain Location: left knee Pain Descriptors / Indicators: Aching Pain Intervention(s): Monitored during session;Limited activity within patient's tolerance;Repositioned    Home Living Family/patient expects to be discharged to:: Private residence Living Arrangements: Spouse/significant other Available Help at Discharge: Family;Available 24 hours/day Type of Home: House Home Access: Stairs to enter Entrance Stairs-Rails: None Entrance Stairs-Number of Steps: 1 Home Layout: One level Home Equipment: Walker - 2 wheels      Prior Function Level of Independence: Independent               Hand Dominance   Dominant Hand: Right    Extremity/Trunk Assessment   Upper Extremity Assessment Upper Extremity Assessment: Overall WFL for tasks assessed    Lower Extremity Assessment Lower Extremity Assessment: RLE deficits/detail;LLE  deficits/detail RLE Deficits / Details: WFL, some arthritis LLE Deficits / Details: ankle 5/5, unable to perform active SLR LLE Sensation: WNL       Communication   Communication: No difficulties  Cognition Arousal/Alertness: Awake/alert Behavior During Therapy: WFL for  tasks assessed/performed Overall Cognitive Status: Within Functional Limits for tasks assessed                                        General Comments General comments (skin integrity, edema, etc.): VSS    Exercises Total Joint Exercises Ankle Circles/Pumps: AROM;Both;20 reps Quad Sets: AROM;Left;10 reps Towel Squeeze: AROM;Both;10 reps Short Arc QuadBarbaraann Boys;Left;10 reps Heel Slides: AAROM;Left;10 reps Hip ABduction/ADduction: AROM;Left;10 reps Straight Leg Raises: AAROM;Left;10 reps Long Arc Quad: AAROM;Left;10 reps Knee Flexion: AROM;AAROM;Left;10 reps Goniometric ROM: knee: 9-50 degrees   Assessment/Plan    PT Assessment Patient needs continued PT services  PT Problem List Decreased strength;Decreased range of motion;Decreased mobility;Decreased knowledge of use of DME;Decreased knowledge of precautions;Pain       PT Treatment Interventions DME instruction;Gait training;Stair training;Functional mobility training;Therapeutic activities;Therapeutic exercise;Balance training;Patient/family education;Manual techniques;Modalities;Neuromuscular re-education    PT Goals (Current goals can be found in the Care Plan section)  Acute Rehab PT Goals Patient Stated Goal: (to get back to walking normally again) PT Goal Formulation: With patient/family Time For Goal Achievement: 01/08/18 Potential to Achieve Goals: Good    Frequency 7X/week   Barriers to discharge        Co-evaluation               AM-PAC PT "6 Clicks" Daily Activity  Outcome Measure Difficulty turning over in bed (including adjusting bedclothes, sheets and blankets)?: A Little Difficulty moving from lying on back to sitting on the side of the bed? : Unable Difficulty sitting down on and standing up from a chair with arms (e.g., wheelchair, bedside commode, etc,.)?: Unable Help needed moving to and from a bed to chair (including a wheelchair)?: A Little Help needed walking in hospital  room?: A Little Help needed climbing 3-5 steps with a railing? : A Lot 6 Click Score: 13    End of Session Equipment Utilized During Treatment: Gait belt;Left knee immobilizer Activity Tolerance: Patient tolerated treatment well;Patient limited by pain Patient left: in chair;with call bell/phone within reach;with family/visitor present Nurse Communication: Mobility status PT Visit Diagnosis: Muscle weakness (generalized) (M62.81);Difficulty in walking, not elsewhere classified (R26.2)    Time: 1610-9604 PT Time Calculation (min) (ACUTE ONLY): 45 min   Charges:   PT Evaluation $PT Eval Moderate Complexity: 1 Mod     PT G Codes:        Barrie Dunker, SPT  Barrie Dunker 12/25/2017, 11:52 AM

## 2017-12-25 NOTE — Discharge Instructions (Signed)
You may bear weight as tolerated. °Keep your dressing on and dry until follow up. °Take medicine to prevent blood clots as directed. °Take pain medicine as needed with the goal of transitioning to over the counter medicines.  ° °INSTRUCTIONS AFTER JOINT REPLACEMENT  ° °o Remove items at home which could result in a fall. This includes throw rugs or furniture in walking pathways °o ICE to the affected joint every three hours while awake for 30 minutes at a time, for at least the first 3-5 days, and then as needed for pain and swelling.  Continue to use ice for pain and swelling. You may notice swelling that will progress down to the foot and ankle.  This is normal after surgery.  Elevate your leg when you are not up walking on it.   °o Continue to use the breathing machine you got in the hospital (incentive spirometer) which will help keep your temperature down.  It is common for your temperature to cycle up and down following surgery, especially at night when you are not up moving around and exerting yourself.  The breathing machine keeps your lungs expanded and your temperature down. ° ° °DIET:  As you were doing prior to hospitalization, we recommend a well-balanced diet. ° °DRESSING / WOUND CARE / SHOWERING ° °You may shower 3 days after surgery, but keep the wounds dry during showering.  You may use an occlusive plastic wrap (Press'n Seal for example) with blue painter's tape at edges, NO SOAKING/SUBMERGING IN THE BATHTUB.  If the bandage gets wet, change with a clean dry gauze.  If the incision gets wet, pat the wound dry with a clean towel. ° °ACTIVITY ° °o Increase activity slowly as tolerated, but follow the weight bearing instructions below.   °o No driving for 6 weeks or until further direction given by your physician.  You cannot drive while taking narcotics.  °o No lifting or carrying greater than 10 lbs. until further directed by your surgeon. °o Avoid periods of inactivity such as sitting longer than  an hour when not asleep. This helps prevent blood clots.  °o You may return to work once you are authorized by your doctor.  ° ° ° °WEIGHT BEARING  ° °Weight bearing as tolerated with assist device (walker, cane, etc) as directed, use it as long as suggested by your surgeon or therapist, typically at least 4-6 weeks. ° ° °EXERCISES ° °Results after joint replacement surgery are often greatly improved when you follow the exercise, range of motion and muscle strengthening exercises prescribed by your doctor. Safety measures are also important to protect the joint from further injury. Any time any of these exercises cause you to have increased pain or swelling, decrease what you are doing until you are comfortable again and then slowly increase them. If you have problems or questions, call your caregiver or physical therapist for advice.  ° °Rehabilitation is important following a joint replacement. After just a few days of immobilization, the muscles of the leg can become weakened and shrink (atrophy).  These exercises are designed to build up the tone and strength of the thigh and leg muscles and to improve motion. Often times heat used for twenty to thirty minutes before working out will loosen up your tissues and help with improving the range of motion but do not use heat for the first two weeks following surgery (sometimes heat can increase post-operative swelling).  ° °These exercises can be done on a training (exercise)   mat, on the floor, on a table or on a bed. Use whatever works the best and is most comfortable for you.    Use music or television while you are exercising so that the exercises are a pleasant break in your day. This will make your life better with the exercises acting as a break in your routine that you can look forward to.   Perform all exercises about fifteen times, three times per day or as directed.  You should exercise both the operative leg and the other leg as well. ° °Exercises  include: °  °• Quad Sets - Tighten up the muscle on the front of the thigh (Quad) and hold for 5-10 seconds.   °• Straight Leg Raises - With your knee straight (if you were given a brace, keep it on), lift the leg to 60 degrees, hold for 3 seconds, and slowly lower the leg.  Perform this exercise against resistance later as your leg gets stronger.  °• Leg Slides: Lying on your back, slowly slide your foot toward your buttocks, bending your knee up off the floor (only go as far as is comfortable). Then slowly slide your foot back down until your leg is flat on the floor again.  °• Angel Wings: Lying on your back spread your legs to the side as far apart as you can without causing discomfort.  °• Hamstring Strength:  Lying on your back, push your heel against the floor with your leg straight by tightening up the muscles of your buttocks.  Repeat, but this time bend your knee to a comfortable angle, and push your heel against the floor.  You may put a pillow under the heel to make it more comfortable if necessary.  ° °A rehabilitation program following joint replacement surgery can speed recovery and prevent re-injury in the future due to weakened muscles. Contact your doctor or a physical therapist for more information on knee rehabilitation.  ° ° °CONSTIPATION ° °Constipation is defined medically as fewer than three stools per week and severe constipation as less than one stool per week.  Even if you have a regular bowel pattern at home, your normal regimen is likely to be disrupted due to multiple reasons following surgery.  Combination of anesthesia, postoperative narcotics, change in appetite and fluid intake all can affect your bowels.  ° °YOU MUST use at least one of the following options; they are listed in order of increasing strength to get the job done.  They are all available over the counter, and you may need to use some, POSSIBLY even all of these options:   ° °Drink plenty of fluids (prune juice may be  helpful) and high fiber foods °Colace 100 mg by mouth twice a day  °Senokot for constipation as directed and as needed Dulcolax (bisacodyl), take with full glass of water  °Miralax (polyethylene glycol) once or twice a day as needed. ° °If you have tried all these things and are unable to have a bowel movement in the first 3-4 days after surgery call either your surgeon or your primary doctor.   ° °If you experience loose stools or diarrhea, hold the medications until you stool forms back up.  If your symptoms do not get better within 1 week or if they get worse, check with your doctor.  If you experience "the worst abdominal pain ever" or develop nausea or vomiting, please contact the office immediately for further recommendations for treatment. ° ° °ITCHING:  If you   experience itching with your medications, try taking only a single pain pill, or even half a pain pill at a time.  You can also use Benadryl over the counter for itching or also to help with sleep.   TED HOSE STOCKINGS:  Use stockings on both legs until for at least 2 weeks or as directed by physician office. They may be removed at night for sleeping.  MEDICATIONS:  See your medication summary on the After Visit Summary that nursing will review with you.  You may have some home medications which will be placed on hold until you complete the course of blood thinner medication.  It is important for you to complete the blood thinner medication as prescribed.  PRECAUTIONS:  If you experience chest pain or shortness of breath - call 911 immediately for transfer to the hospital emergency department.   If you develop a fever greater that 101 F, purulent drainage from wound, increased redness or drainage from wound, foul odor from the wound/dressing, or calf pain - CONTACT YOUR SURGEON.                                                   FOLLOW-UP APPOINTMENTS:  If you do not already have a post-op appointment, please call the office for an  appointment to be seen by your surgeon.  Guidelines for how soon to be seen are listed in your After Visit Summary, but are typically between 1-4 weeks after surgery.  OTHER INSTRUCTIONS:     MAKE SURE YOU:   Understand these instructions.   Get help right away if you are not doing well or get worse.    Thank you for letting us be a part of your medical care team.  It is a privilege we respect greatly.  We hope these instructions will help you stay on track for a fast and full recovery!   Information on my medicine - XARELTO (Rivaroxaban)  This medication education was reviewed with me or my healthcare representative as part of my discharge preparation.  The pharmacist that spoke with me during my hospital stay was:  Fayne Norrie, [facility]  Why was Xarelto prescribed for you? Xarelto was prescribed for you to reduce the risk of blood clots forming after orthopedic surgery. The medical term for these abnormal blood clots is venous thromboembolism (VTE).  What do you need to know about xarelto ? Take your Xarelto ONCE DAILY at the same time every day. You may take it either with or without food.  If you have difficulty swallowing the tablet whole, you may crush it and mix in applesauce just prior to taking your dose.  Take Xarelto exactly as prescribed by your doctor and DO NOT stop taking Xarelto without talking to the doctor who prescribed the medication.  Stopping without other VTE prevention medication to take the place of Xarelto may increase your risk of developing a clot.  After discharge, you should have regular check-up appointments with your healthcare provider that is prescribing your Xarelto.    What do you do if you miss a dose? If you miss a dose, take it as soon as you remember on the same day then continue your regularly scheduled once daily regimen the next day. Do not take two doses of Xarelto on the same day.   Important Safety Information A  possible side effect of Xarelto is bleeding. You should call your healthcare provider right away if you experience any of the following: ? Bleeding from an injury or your nose that does not stop. ? Unusual colored urine (red or dark brown) or unusual colored stools (red or black). ? Unusual bruising for unknown reasons. ? A serious fall or if you hit your head (even if there is no bleeding).  Some medicines may interact with Xarelto and might increase your risk of bleeding while on Xarelto. To help avoid this, consult your healthcare provider or pharmacist prior to using any new prescription or non-prescription medications, including herbals, vitamins, non-steroidal anti-inflammatory drugs (NSAIDs) and supplements.  This website has more information on Xarelto: [email or web address].com.br.

## 2017-12-25 NOTE — Progress Notes (Signed)
    Subjective: Patient reports pain as moderate.  Tolerating diet.  Urinating.  +Flatus.  No CP, SOB.  Not yet OOB.  Objective:   VITALS:   Vitals:   12/24/17 1740 12/24/17 2028 12/25/17 0113 12/25/17 0502  BP: 118/69 126/88 131/86 (!) 143/82  Pulse: (!) 50 (!) 58 60 72  Resp: 18 16 16 16   Temp: 97.6 F (36.4 C) 97.9 F (36.6 C) 98.9 F (37.2 C) 99.4 F (37.4 C)  TempSrc: Oral Oral Oral Oral  SpO2: 93% 100% 99% 100%  Weight:      Height:       CBC Latest Ref Rng & Units 12/12/2017 09/27/2017 12/09/2016  WBC 4.0 - 10.5 K/uL 4.8 5.2 3.9(L)  Hemoglobin 13.0 - 17.0 g/dL 11.9(L) 12.6(L) 12.7(L)  Hematocrit 39.0 - 52.0 % 37.7(L) 37.9(L) 38.9(L)  Platelets 150 - 400 K/uL 225 231 143(L)   BMP Latest Ref Rng & Units 12/12/2017 09/27/2017 12/09/2016  Glucose 65 - 99 mg/dL 86 888(L) 579(J)  BUN 6 - 20 mg/dL 17 20 19   Creatinine 0.61 - 1.24 mg/dL 2.82(S) 6.01(V) 6.15(P)  Sodium 135 - 145 mmol/L 140 141 134(L)  Potassium 3.5 - 5.1 mmol/L 4.1 3.7 3.6  Chloride 101 - 111 mmol/L 108 109 101  CO2 22 - 32 mmol/L 23 23 21(L)  Calcium 8.9 - 10.3 mg/dL 9.1 9.8 9.1   Intake/Output      03/05 0701 - 03/06 0700 03/06 0701 - 03/07 0700   P.O. 340    I.V. (mL/kg) 900 (9)    Total Intake(mL/kg) 1240 (12.4)    Urine (mL/kg/hr) 2150    Blood 25    Total Output 2175    Net -935           Physical Exam: General: NAD.  Upright in bed.  Calm, conversant.  Wife at bedside.  No increased work of breathing. ABD soft Neurologically intact MSK Neurovascularly intact Sensation intact distally Feet warm Dorsiflexion/Plantar flexion intact Incision: dressing C/D/I   Assessment: 1 Day Post-Op  S/P Procedure(s) (LRB): LEFT TOTAL KNEE ARTHROPLASTY (Left) by Dr. Jewel Baize. Murphy on 12/24/2017  Principal Problem:   Primary osteoarthritis of left knee Active Problems:   Hypertension   High cholesterol   Primary osteoarthritis of knee   OA left knee, status post left TKA during well postop day  1. Eating, Drinking, and voiding. Not yet up out of bed Pain controlled with p.o. medicine   Plan:  Advance diet Up with therapy D/C IV fluids Incentive Spirometry Elevate and Apply ice  Weight Bearing: Weight Bearing as Tolerated (WBAT)  Dressings: Maintain Mepilex.  Please apply thigh high TED hose operative leg prior to discharge.  VTE prophylaxis: Xarelto, SCDs, ambulation Dispo: Home today after second set of therapies if Cleared by PT, pain & nausea controlled PO, ambulating and tolerating diet.   Albina Billet III, PA-C 12/25/2017, 7:33 AM

## 2017-12-25 NOTE — Plan of Care (Signed)
  Education: Knowledge of General Education information will improve 12/25/2017 0540 - Progressing by Olena Mater, RN Note POC and pain management reviewed with pt.

## 2017-12-25 NOTE — Discharge Summary (Signed)
Discharge Summary  Patient ID: Phillip Kennedy MRN: [id number] DOB/AGE: 62/07/1956 62 y.o.  Admit date: 12/24/2017 Discharge date: 12/26/2017  Admission Diagnoses:  Primary osteoarthritis of left knee  Discharge Diagnoses:  Principal Problem:   Primary osteoarthritis of left knee Active Problems:   Hypertension   High cholesterol   Primary osteoarthritis of knee   Past Medical History:  Diagnosis Date  . Arthritis    "knees and back" (12/24/2017)  . Chronic bronchitis (HCC)   . Dyspnea   . High cholesterol   . Hypertension     Surgeries: Procedure(s): LEFT TOTAL KNEE ARTHROPLASTY on 12/24/2017   Consultants (if any):   Discharged Condition: Improved  Hospital Course: Phillip Kennedy is an 62 y.o. male who was admitted 12/24/2017 with a diagnosis of Primary osteoarthritis of left knee and went to the operating room on 12/24/2017 and underwent the above named procedures.    He was given perioperative antibiotics:  Anti-infectives (From admission, onward)   Start     Dose/Rate Route Frequency Ordered Stop   12/24/17 1715  ceFAZolin (ANCEF) IVPB 1 g/50 mL premix     1 g 100 mL/hr over 30 Minutes Intravenous Every 6 hours 12/24/17 1706 12/24/17 2249   12/24/17 1130  ceFAZolin (ANCEF) IVPB 2g/100 mL premix     2 g 200 mL/hr over 30 Minutes Intravenous To [facility] 12/23/17 1306 12/24/17 1215    .  He was given sequential compression devices, early ambulation, and Xarelto for DVT prophylaxis.  He benefited maximally from the hospital stay and there were no complications.  He remained in the hospital for 2 nights to ensure safe mobilization with PT before discharge to home.  Recent vital signs:  Vitals:   12/25/17 2100 12/26/17 0500  BP: 118/78 109/79  Pulse: 70 64  Resp: 17 15  Temp: 99.6 F (37.6 C) 99.5 F (37.5 C)  SpO2: 98% 98%    Recent laboratory studies:  Lab Results  Component Value Date   HGB 11.9 (L) 12/12/2017   HGB 12.6 (L) 09/27/2017   HGB 12.7  (L) 12/09/2016   Lab Results  Component Value Date   WBC 4.8 12/12/2017   PLT 225 12/12/2017   No results found for: INR Lab Results  Component Value Date   NA 140 12/12/2017   K 4.1 12/12/2017   CL 108 12/12/2017   CO2 23 12/12/2017   BUN 17 12/12/2017   CREATININE 1.68 (H) 12/12/2017   GLUCOSE 86 12/12/2017    Discharge Medications:   Allergies as of 12/26/2017   No Known Allergies     Medication List    TAKE these medications   acetaminophen 500 MG tablet Commonly known as:  TYLENOL Take 2 tablets (1,000 mg total) by mouth every 8 (eight) hours for 14 days. For Pain. What changed:    when to take this  reasons to take this  additional instructions   albuterol 108 (90 Base) MCG/ACT inhaler Commonly known as:  PROVENTIL HFA;VENTOLIN HFA Inhale 1-2 puffs into the lungs every 6 (six) hours as needed for wheezing or shortness of breath.   amLODipine 5 MG tablet Commonly known as:  NORVASC Take 5 mg by mouth 2 (two) times daily.   CALCIUM + D3 PO Take 1 tablet by mouth daily.   cloNIDine 0.2 MG tablet Commonly known as:  CATAPRES Take 0.2 mg by mouth at bedtime.   docusate sodium 100 MG capsule Commonly known as:  COLACE Take 1 capsule (100 mg  total) by mouth 2 (two) times daily. To prevent constipation while taking pain medication.   FISH OIL PO Take 1 capsule by mouth daily.   hydrALAZINE 25 MG tablet Commonly known as:  APRESOLINE Take 25 mg by mouth 3 (three) times daily.   losartan 100 MG tablet Commonly known as:  COZAAR Take 100 mg by mouth daily.   methocarbamol 500 MG tablet Commonly known as:  ROBAXIN Take 1 tablet (500 mg total) by mouth every 6 (six) hours as needed for muscle spasms.   metoprolol tartrate 100 MG tablet Commonly known as:  LOPRESSOR Take 100 mg by mouth 2 (two) times daily.   ondansetron 4 MG tablet Commonly known as:  ZOFRAN Take 1 tablet (4 mg total) by mouth every 8 (eight) hours as needed for nausea or  vomiting.   oxyCODONE 5 MG immediate release tablet Commonly known as:  ROXICODONE Take 1 tablet (5 mg total) by mouth every 4 (four) hours as needed for breakthrough pain.   pravastatin 40 MG tablet Commonly known as:  PRAVACHOL Take 40 mg by mouth daily.   rivaroxaban 10 MG Tabs tablet Commonly known as:  XARELTO Take 1 tablet (10 mg total) by mouth daily. For 30 days for DVT prophylaxis       Diagnostic Studies: Dg Knee Left Port  Result Date: 12/24/2017 CLINICAL DATA:  62 year old male status post left knee arthroplasty. EXAM: PORTABLE LEFT KNEE - 1-2 VIEW COMPARISON:  08/21/2012. FINDINGS: AP portable supine and cross-table lateral views of the left knee at 1519 hours. Left total knee arthroplasty hardware in place and normally aligned. Postoperative air in fluid level within the left knee joint space, and fairly abundant subcutaneous gas about the knee. No unexpected osseous changes are identified. IMPRESSION: Left total knee arthroplasty with no adverse features identified. Electronically Signed   By: Odessa Fleming M.D.   On: 12/24/2017 15:52    Disposition: 01-Home or Self Care  Discharge Instructions    Discharge patient   Complete by:  As directed    After second set of therapies today if: Cleared by PT, pain & nausea controlled PO, ambulating and tolerating diet.   Discharge disposition:  01-Home or Self Care   Discharge patient date:  12/25/2017   Discharge patient   Complete by:  As directed    After therapy session.   Discharge disposition:  01-Home or Self Care   Discharge patient date:  12/26/2017      Follow-up Information    Sheral Apley, MD.   Specialty:  Orthopedic Surgery Contact information: [address] [place] [address] [phone]            Signed: Albina Billet III PA-C 12/26/2017, 7:05 AM

## 2017-12-26 DIAGNOSIS — M1712 Unilateral primary osteoarthritis, left knee: Secondary | ICD-10-CM | POA: Diagnosis not present

## 2017-12-26 DIAGNOSIS — F17200 Nicotine dependence, unspecified, uncomplicated: Secondary | ICD-10-CM | POA: Diagnosis present

## 2017-12-26 NOTE — Progress Notes (Signed)
Physical Therapy Treatment Patient Details Name: Phillip Kennedy MRN: [id number] DOB: [birth year] Today's Date: 12/26/2017    History of Present Illness 62 y.o. male admitted on 12/24/17 for elective L TKA. Pt with significant PMH of HTN, high cholesterol, chronic bronchitis, and arthritis.    PT Comments    Pt progressing towards physical therapy goals. Was able to perform transfers and ambulation with gross supervision for safety. Pt continues to have difficulty managing LLE elevation even with knee immobilizer donned, however with compensation techniques is able to complete mobility without assistance. Pt practiced curb step again and was able to complete with close guard for safety but no cues for technique. Pt is safe for d/c from a PT standpoint. Will continue to follow until d/c.    Follow Up Recommendations  Follow surgeon's recommendation for DC plan and follow-up therapies;Supervision for mobility/OOB     Equipment Recommendations  None recommended by PT    Recommendations for Other Services       Precautions / Restrictions Precautions Precautions: Knee;Fall Precaution Booklet Issued: Yes (comment) Precaution Comments: Reinforced knee precautions and proper resting position with towel roll or bone foam Required Braces or Orthoses: Knee Immobilizer - Left Knee Immobilizer - Left: Discontinue once straight leg raise with < 10 degree lag Restrictions Weight Bearing Restrictions: Yes LLE Weight Bearing: Weight bearing as tolerated Other Position/Activity Restrictions: WBAT    Mobility  Bed Mobility Overal bed mobility: Modified Independent Bed Mobility: Supine to Sit;Sit to Supine     Supine to sit: Modified independent (Device/Increase time) Sit to supine: Modified independent (Device/Increase time)   General bed mobility comments: Pt utilized opposite leg hook technique to advance LE to EOB. Increased time but pt was able to complete with HOB flat and rails lowered to  simulate home environment.   Transfers Overall transfer level: Needs assistance Equipment used: Rolling walker (2 wheeled) Transfers: Sit to/from Stand Sit to Stand: Supervision         General transfer comment: Pt demonstrated proper hand placement on seated surface for safety.   Ambulation/Gait Ambulation/Gait assistance: Supervision Ambulation Distance (Feet): 150 Feet Assistive device: Rolling walker (2 wheeled) Gait Pattern/deviations: Step-through pattern;Antalgic Gait velocity: decreased Gait velocity interpretation: Below normal speed for age/gender General Gait Details: VC's for more even stride length, increased heel strike, and fluidity of walker movement. Almost continuous cues provided for improved technique however pt demonstrating a good rehab effort to make changes.    Stairs Stairs: Yes   Stair Management: Forwards;With walker Number of Stairs: 2(1 x2) General stair comments: Pt demonstrated proper sequencing. Therapist priovided close guard for safety.   Wheelchair Mobility    Modified Rankin (Stroke Patients Only)       Balance Overall balance assessment: No apparent balance deficits (not formally assessed)                                          Cognition Arousal/Alertness: Awake/alert Behavior During Therapy: WFL for tasks assessed/performed Overall Cognitive Status: Within Functional Limits for tasks assessed                                        Exercises Total Joint Exercises Ankle Circles/Pumps: AROM;Both;20 reps Quad Sets: AROM;Left;10 reps Towel Squeeze: AROM;Both;10 reps Short Arc QuadBarbaraann Kennedy;Left;10 reps;AROM Heel Slides: AAROM;Left;10  reps Hip ABduction/ADduction: AROM;Left;10 reps Straight Leg Raises: AAROM;Left;10 reps Long Arc Quad: AAROM;Left;10 reps    General Comments        Pertinent Vitals/Pain Pain Assessment: Faces Faces Pain Scale: Hurts little more Pain Location: left knee  and quad Pain Descriptors / Indicators: Aching;Sore Pain Intervention(s): Monitored during session;Repositioned    Home Living                      Prior Function            PT Goals (current goals can now be found in the care plan section) Acute Rehab PT Goals Patient Stated Goal: to get back to walking normally again PT Goal Formulation: With patient/family Time For Goal Achievement: 01/08/18 Potential to Achieve Goals: Good Progress towards PT goals: Progressing toward goals    Frequency    7X/week      PT Plan Current plan remains appropriate    Co-evaluation              AM-PAC PT "6 Clicks" Daily Activity  Outcome Measure  Difficulty turning over in bed (including adjusting bedclothes, sheets and blankets)?: None Difficulty moving from lying on back to sitting on the side of the bed? : A Little Difficulty sitting down on and standing up from a chair with arms (e.g., wheelchair, bedside commode, etc,.)?: None Help needed moving to and from a bed to chair (including a wheelchair)?: None Help needed walking in hospital room?: None Help needed climbing 3-5 steps with a railing? : A Little 6 Click Score: 22    End of Session Equipment Utilized During Treatment: Gait belt;Left knee immobilizer Activity Tolerance: Patient limited by pain Patient left: in chair;with call bell/phone within reach;with family/visitor present Nurse Communication: Mobility status PT Visit Diagnosis: Muscle weakness (generalized) (M62.81);Difficulty in walking, not elsewhere classified (R26.2)     Time: 1430-1500 PT Time Calculation (min) (ACUTE ONLY): 30 min  Charges:  $Gait Training: 8-22 mins $Therapeutic Exercise: 8-22 mins                    G Codes:       Phillip Kennedy, PT, DPT Acute Rehabilitation Services Pager: [phone]    Phillip Kennedy 12/26/2017, 3:25 PM

## 2017-12-26 NOTE — Progress Notes (Signed)
    Subjective: Patient reports pain as moderate, controlled w/ PO medicine.  Tolerating diet.  Urinating.  +Flatus.  No CP, SOB.  Mobilizing w/ PT improving.  Objective:   VITALS:   Vitals:   12/25/17 0953 12/25/17 1500 12/25/17 2100 12/26/17 0500  BP: 114/82 116/84 118/78 109/79  Pulse: 77 65 70 64  Resp:  16 17 15   Temp:  99.8 F (37.7 C) 99.6 F (37.6 C) 99.5 F (37.5 C)  TempSrc:  Oral Oral Oral  SpO2: 100% 98% 98% 98%  Weight:      Height:       CBC Latest Ref Rng & Units 12/12/2017 09/27/2017 12/09/2016  WBC 4.0 - 10.5 K/uL 4.8 5.2 3.9(L)  Hemoglobin 13.0 - 17.0 g/dL 11.9(L) 12.6(L) 12.7(L)  Hematocrit 39.0 - 52.0 % 37.7(L) 37.9(L) 38.9(L)  Platelets 150 - 400 K/uL 225 231 143(L)   BMP Latest Ref Rng & Units 12/12/2017 09/27/2017 12/09/2016  Glucose 65 - 99 mg/dL 86 026(V) 785(Y)  BUN 6 - 20 mg/dL 17 20 19   Creatinine 0.61 - 1.24 mg/dL 8.50(Y) 7.74(J) 2.87(O)  Sodium 135 - 145 mmol/L 140 141 134(L)  Potassium 3.5 - 5.1 mmol/L 4.1 3.7 3.6  Chloride 101 - 111 mmol/L 108 109 101  CO2 22 - 32 mmol/L 23 23 21(L)  Calcium 8.9 - 10.3 mg/dL 9.1 9.8 9.1   Intake/Output      03/06 0701 - 03/07 0700 03/07 0701 - 03/08 0700   P.O. 240    I.V. (mL/kg)     Total Intake(mL/kg) 240 (2.4)    Urine (mL/kg/hr) 700 (0.3)    Blood     Total Output 700    Net -460           Physical Exam: General: NAD.  Upright in bed.  Calm, conversant.  No increased work of breathing. ABD soft Neurologically intact MSK Neurovascularly intact Sensation intact distally Feet warm Dorsiflexion/Plantar flexion intact Incision: dressing C/D/I   Assessment: 2 Days Post-Op  S/P Procedure(s) (LRB): LEFT TOTAL KNEE ARTHROPLASTY (Left) by Dr. Jewel Baize. [name] on 12/24/2017  Principal Problem:   Primary osteoarthritis of left knee Active Problems:   Hypertension   High cholesterol   Primary osteoarthritis of knee   Tobacco use disorder   OA left knee, status post left TKA during well  postop day 1. Eating, Drinking, and voiding. Mobilization improving w/ therapy Pain controlled with p.o. medicine   Plan:  Advance diet Up with therapy D/C IV fluids Incentive Spirometry Elevate and Apply ice CPM, bone foam Smoking cessation  Weight Bearing: Weight Bearing as Tolerated (WBAT)  Dressings: Maintain Mepilex.  Please remove Ace Wrap and apply thigh high TED hose operative leg prior to discharge.  VTE prophylaxis: Xarelto, SCDs, ambulation Dispo: Home today after therapy.  Lucretia Kern Martensen III, PA-C 12/26/2017, 7:07 AM

## 2017-12-26 NOTE — Plan of Care (Signed)
  Health Behavior/Discharge Planning: Ability to manage health-related needs will improve 12/26/2017 1502 - Progressing by Ephraim Hamburger, RN   Activity: Range of joint motion will improve 12/26/2017 1502 - Adequate for Discharge by Ephraim Hamburger, RN   Pain Management: Pain level will decrease with appropriate interventions 12/26/2017 1502 - Adequate for Discharge by Ephraim Hamburger, RN

## 2017-12-26 NOTE — Care Management Note (Signed)
Case Management Note  Patient Details  Name: Phillip Kennedy MRN: [id number] Date of Birth: Sep 09, 1956  Subjective/Objective:  62 yr old gentleman s/p left total  Knee arthroplasty.                Action/Plan: Patient was preoperatively setup with [facility], no changes. DME has been delivered to his home. Will have support at discharge.    Expected Discharge Date:  12/26/17               Expected Discharge Plan:  Home w Home Health Services  In-House Referral:     Discharge planning Services  CM Consult  Post Acute Care Choice:  Durable Medical Equipment, Home Health Choice offered to:  Patient  DME Arranged:  3-N-1, Walker rolling, CPM DME Agency:  [employer]  HH Arranged:  PT HH Agency:  [facility]  Status of Service:  Completed, signed off  If discussed at Long Length of Stay Meetings, dates discussed:    Additional Comments:  Durenda Guthrie, RN 12/26/2017, 2:53 PM

## 2017-12-26 NOTE — Progress Notes (Signed)
Pt given prescriptions and discharge instructions. Instructions gone over with him and answered all questions to satisfaction. All belongings gathered to be sent home with pt. Pt in no distress at time of discharge. Pt is waiting for wife to arrive to take him home.

## 2017-12-26 NOTE — Progress Notes (Signed)
Physical Therapy Treatment Patient Details Name: Phillip Kennedy MRN: [id number] DOB: 12-20-1955 Today's Date: 12/26/2017    History of Present Illness 62 y.o. male admitted on 12/24/17 for elective L TKA. Pt with significant PMH of HTN, high cholesterol, chronic bronchitis, and arthritis.    PT Comments    Pt is POD #2 and is doing well, moving more independently.  He did much better with stair practice the second time we did it this session and is progressing his ambulation distance down the hallway.  We will need to walk, do the curb step one more time and complete his seated exercises this PM and then he will be ready to d/c home from a mobility standpoint.     Follow Up Recommendations  Follow surgeon's recommendation for DC plan and follow-up therapies;Supervision for mobility/OOB     Equipment Recommendations  None recommended by PT    Recommendations for Other Services   NA     Precautions / Restrictions Precautions Precautions: Knee;Fall Precaution Booklet Issued: Yes (comment) Precaution Comments: exercise handout provided and reviewed. educated pt on knee precautions. Required Braces or Orthoses: Knee Immobilizer - Left Knee Immobilizer - Left: Discontinue once straight leg raise with < 10 degree lag Restrictions Weight Bearing Restrictions: Yes LLE Weight Bearing: Weight bearing as tolerated    Mobility  Bed Mobility Overal bed mobility: Modified Independent Bed Mobility: Supine to Sit;Sit to Supine     Supine to sit: Modified independent (Device/Increase time) Sit to supine: Modified independent (Device/Increase time)   General bed mobility comments: PT taught pt opposite leg hook technique to move leg into and out of bed.    Transfers Overall transfer level: Needs assistance Equipment used: Rolling walker (2 wheeled) Transfers: Sit to/from Stand Sit to Stand: Supervision         General transfer comment: supervision for safety.    Ambulation/Gait Ambulation/Gait assistance: Supervision Ambulation Distance (Feet): 120 Feet Assistive device: Rolling walker (2 wheeled) Gait Pattern/deviations: Step-through pattern;Antalgic Gait velocity: decreased Gait velocity interpretation: Below normal speed for age/gender General Gait Details: Pt with more confident stride and less reliance on hands for support on RW today.    Stairs Stairs: Yes   Stair Management: Forwards;With walker Number of Stairs: 1(x2) General stair comments: Pt needed cues to reinforce sequencing, heavy reliance on arms to step up and unweight his sore leg.  Min guard first time, close supervision the second time.           Balance Overall balance assessment: No apparent balance deficits (not formally assessed)                                          Cognition Arousal/Alertness: Awake/alert Behavior During Therapy: WFL for tasks assessed/performed Overall Cognitive Status: Within Functional Limits for tasks assessed                                        Exercises Total Joint Exercises Ankle Circles/Pumps: AROM;Both;20 reps Quad Sets: AROM;Left;10 reps Towel Squeeze: AROM;Both;10 reps Short Arc QuadBarbaraann Boys;Left;10 reps;AROM Heel Slides: AAROM;Left;10 reps Hip ABduction/ADduction: AROM;Left;10 reps Straight Leg Raises: AAROM;Left;10 reps        Pertinent Vitals/Pain Pain Assessment: Faces Faces Pain Scale: Hurts whole lot Pain Location: left knee and quad Pain Descriptors / Indicators: Aching;Sore Pain  Intervention(s): Limited activity within patient's tolerance;Monitored during session;Repositioned;Ice applied           PT Goals (current goals can now be found in the care plan section) Acute Rehab PT Goals Patient Stated Goal: to get back to walking normally again Progress towards PT goals: Progressing toward goals    Frequency    7X/week      PT Plan Current plan remains  appropriate       AM-PAC PT "6 Clicks" Daily Activity  Outcome Measure  Difficulty turning over in bed (including adjusting bedclothes, sheets and blankets)?: A Little Difficulty moving from lying on back to sitting on the side of the bed? : A Little Difficulty sitting down on and standing up from a chair with arms (e.g., wheelchair, bedside commode, etc,.)?: A Little Help needed moving to and from a bed to chair (including a wheelchair)?: None Help needed walking in hospital room?: None Help needed climbing 3-5 steps with a railing? : A Little 6 Click Score: 20    End of Session Equipment Utilized During Treatment: Gait belt;Left knee immobilizer Activity Tolerance: Patient limited by pain Patient left: in chair;with call bell/phone within reach;with family/visitor present Nurse Communication: Patient requests pain meds PT Visit Diagnosis: Muscle weakness (generalized) (M62.81);Difficulty in walking, not elsewhere classified (R26.2)     Time: 8887-5797 PT Time Calculation (min) (ACUTE ONLY): 35 min  Charges:  $Gait Training: 8-22 mins $Therapeutic Exercise: 8-22 mins          Yajayra Feldt B. Sela Falk, PT, DPT [phone]            12/26/2017, 11:37 AM

## 2018-01-09 ENCOUNTER — Other Ambulatory Visit: Payer: Self-pay

## 2018-01-09 ENCOUNTER — Ambulatory Visit: Payer: Medicaid Other | Attending: Cardiovascular Disease | Admitting: Physical Therapy

## 2018-01-09 ENCOUNTER — Encounter: Payer: Self-pay | Admitting: Physical Therapy

## 2018-01-09 DIAGNOSIS — M25662 Stiffness of left knee, not elsewhere classified: Secondary | ICD-10-CM | POA: Insufficient documentation

## 2018-01-09 DIAGNOSIS — M25562 Pain in left knee: Secondary | ICD-10-CM | POA: Diagnosis not present

## 2018-01-09 NOTE — Therapy (Signed)
[facility] Outpatient Rehabilitation [facility] [address] [place], [place], [address] Phone: [phone]   Fax:  [phone]  Physical Therapy Evaluation  Patient Details  Name: Phillip Kennedy MRN: [id number] Date of Birth: 10/21/1956 Referring Provider: Margarita Rana, MD   Encounter Date: 01/09/2018  PT End of Session - 01/09/18 1111    Visit Number  1    Number of Visits  4    Date for PT Re-Evaluation  02/07/18    Authorization Type  [employer]- waiting for auth    PT Start Time  1110 pt arrived late    PT Stop Time  1158    PT Time Calculation (min)  48 min    Activity Tolerance  Patient tolerated treatment well    Behavior During Therapy  [facility] for tasks assessed/performed       Past Medical History:  Diagnosis Date  . Arthritis    "knees and back" (12/24/2017)  . Chronic bronchitis (HCC)   . Dyspnea   . High cholesterol   . Hypertension     Past Surgical History:  Procedure Laterality Date  . JOINT REPLACEMENT    . KNEE CARTILAGE SURGERY Bilateral   . MULTIPLE TOOTH EXTRACTIONS    . TOTAL KNEE ARTHROPLASTY Left 12/24/2017  . TOTAL KNEE ARTHROPLASTY Left 12/24/2017   Procedure: LEFT TOTAL KNEE ARTHROPLASTY;  Surgeon: Sheral Apley, MD;  Location: [facility] OR;  Service: Orthopedics;  Laterality: Left;    There were no vitals filed for this visit.   Subjective Assessment - 01/09/18 1114    Subjective  Lt TKA on 3/5, home health 3 visits since surgery. Is currently using RW, not using before surgery.     Patient Stated Goals  decrease pain, handy work, yard work    Currently in Pain?  Yes    Pain Score  7     Pain Location  Knee    Pain Orientation  Left    Pain Descriptors / Indicators  Aching    Pain Type  Acute pain    Aggravating Factors   constant aching    Pain Relieving Factors  rest         [facility] PT Assessment - 01/09/18 0001      Assessment   Medical Diagnosis  s/p Lt TKA    Referring Provider  Margarita Rana, MD    Onset  Date/Surgical Date  12/24/17    Hand Dominance  Right    Prior Therapy  3 home health      Precautions   Precautions  None      Restrictions   Weight Bearing Restrictions  No      Balance Screen   Has the patient fallen in the past 6 months  No      Home Environment   Living Environment  Private residence    Living Arrangements  Spouse/significant other    Additional Comments  one step on porch      Prior Function   Vocation Requirements  handy work      Cognition   Overall Cognitive Status  Within Functional Limits for tasks assessed      Observation/Other Assessments-Edema    Edema  Circumferential      Circumferential Edema   Circumferential - Right  41    Circumferential - Left   46      Sensation   Additional Comments  WFL      ROM / Strength   AROM / PROM / Strength  PROM      PROM   PROM Assessment Site  Knee    Right/Left Knee  Left    Left Knee Extension  -17    Left Knee Flexion  74      Ambulation/Gait   Gait Comments  RW, flat foot, short stride length             Objective measurements completed on examination: See above findings.      [facility] Adult PT Treatment/Exercise - 01/09/18 0001      Exercises   Exercises  Knee/Hip      Knee/Hip Exercises: Stretches   Other Knee/Hip Stretches  heel slide with strap      Knee/Hip Exercises: Standing   Gait Training  with RW      Knee/Hip Exercises: Seated   Other Seated Knee/Hip Exercises  quad set      Knee/Hip Exercises: Supine   Other Supine Knee/Hip Exercises  ankle pumps      Modalities   Modalities  Cryotherapy      Cryotherapy   Number Minutes Cryotherapy  15 Minutes    Cryotherapy Location  Knee    Type of Cryotherapy  Ice pack             PT Education - 01/09/18 1316    Education provided  Yes    Education Details  anatomy of condition, POC, HEP, exercise form/rationale, incision care, elevation, gait pattern, edema    Person(s) Educated  Patient    Methods   Explanation;Demonstration;Tactile cues;Verbal cues;Handout    Comprehension  Verbalized understanding;Need further instruction;Returned demonstration;Verbal cues required;Tactile cues required       PT Short Term Goals - 01/09/18 1312      PT SHORT TERM GOAL #1   Title  pt will be independent with HEP as it has been established    Baseline  began at eval and will progress as appropriate    Time  4    Period  Weeks    Status  New    Target Date  02/07/18 time to accomodate for M[employer] auth period      PT SHORT TERM GOAL #2   Title  Pt will ambulate household distances without RW    Baseline  full time use of RW at eval    Time  4    Period  Weeks    Status  New    Target Date  02/07/18        PT Long Term Goals - 01/09/18 1313      PT LONG TERM GOAL #1   Title  ROM 0-120 for functional range    Baseline  -17-74 at eval    Time  10    Period  Weeks    Status  New    Target Date  03/21/18 time to accomodate for MCD auth period      PT LONG TERM GOAL #2   Title  Pt will be able to complete all household and yard chores independently    Baseline  unable at eval    Time  10    Period  Weeks    Status  New    Target Date  03/21/18      PT LONG TERM GOAL #3   Title  average pain <=2/10 to decrease effects of pain on daily activities    Baseline  7/10 at rest at eval    Time  10    Period  Weeks  Status  New    Target Date  03/21/18      PT LONG TERM GOAL #4   Title  Pt will be independent with long term HEP for continued care of knee    Baseline  will progress as appropriate    Time  10    Period  Weeks    Status  New    Target Date  03/21/18             Plan - 01/09/18 1308    Clinical Impression Statement  Pt presents to PT with complaints of limited functional strength and ROM as well as pain s/p TKA 12/24/17. Notable edema without signs of infection. Ambulating with RW which was elevated to appropriate height today. Pt will benefit from skilled PT in order  to improve functional strength and ROM to meet long term functional goals    Clinical Presentation  Stable    Clinical Decision Making  Low    Rehab Potential  Good    PT Frequency  -- 3 visits in first auth, 2x/week 6 weeks following    PT Treatment/Interventions  ADLs/Self Care Home Management;Cryotherapy;Publishing copy;Therapeutic activities;Therapeutic exercise;Balance training;Patient/family education;Manual techniques;Passive range of motion;Scar mobilization;Vasopneumatic Device;Taping    PT Next Visit Plan  bike, gait pattern in parallel bars, ROM    PT Home Exercise Plan  quad set, heel prop, heel slides, ankle pumps, gait pattern with RW    Consulted and Agree with Plan of Care  Patient       Patient will benefit from skilled therapeutic intervention in order to improve the following deficits and impairments:  Abnormal gait, Pain, Improper body mechanics, Increased muscle spasms, Decreased strength, Decreased range of motion, Decreased activity tolerance, Difficulty walking, Impaired flexibility, Hypomobility  Visit Diagnosis: Acute pain of left knee - Plan: PT plan of care cert/re-cert  Stiffness of left knee, not elsewhere classified - Plan: PT plan of care cert/re-cert     Problem List Patient Active Problem List   Diagnosis Date Noted  . Tobacco use disorder 12/26/2017  . Primary osteoarthritis of knee 12/24/2017  . Hypertension 11/26/2017  . High cholesterol 11/26/2017  . Primary osteoarthritis of left knee 11/26/2017  . Primary osteoarthritis of right knee 09/16/2017    Micaiah Litle C. Shaqueta Casady PT, DPT 01/09/18 5:18 PM   [facility] Outpatient Rehabilitation [facility] [address] [place], [place], [address] Phone: [phone]   Fax:  [phone]  Name: Phillip Kennedy MRN: [id number] Date of Birth: [birth year]

## 2018-01-20 ENCOUNTER — Encounter: Payer: Self-pay | Admitting: Physical Therapy

## 2018-01-20 ENCOUNTER — Ambulatory Visit: Payer: Medicaid Other | Attending: Cardiovascular Disease | Admitting: Physical Therapy

## 2018-01-20 DIAGNOSIS — M25662 Stiffness of left knee, not elsewhere classified: Secondary | ICD-10-CM | POA: Diagnosis present

## 2018-01-20 DIAGNOSIS — M25562 Pain in left knee: Secondary | ICD-10-CM

## 2018-01-20 NOTE — Therapy (Signed)
[facility] [address] [place], [place], [address] Phone: [phone]   Fax:  [phone]  Physical Therapy Treatment  Patient Details  Name: Phillip Kennedy MRN: [id number] Date of Birth: April 03, 1956 Referring Provider: Margarita Rana, MD   Encounter Date: 01/20/2018  PT End of Session - 01/20/18 0833    Visit Number  2    Number of Visits  4    Date for PT Re-Evaluation  02/07/18    Authorization Type  MCD auth 3 visits 3/31-4/27    Authorization - Visit Number  1    Authorization - Number of Visits  3    PT Start Time  0835    PT Stop Time  0927    PT Time Calculation (min)  52 min    Activity Tolerance  Patient tolerated treatment well    Behavior During Therapy  [facility] for tasks assessed/performed       Past Medical History:  Diagnosis Date  . Arthritis    "knees and back" (12/24/2017)  . Chronic bronchitis (HCC)   . Dyspnea   . High cholesterol   . Hypertension     Past Surgical History:  Procedure Laterality Date  . JOINT REPLACEMENT    . KNEE CARTILAGE SURGERY Bilateral   . MULTIPLE TOOTH EXTRACTIONS    . TOTAL KNEE ARTHROPLASTY Left 12/24/2017  . TOTAL KNEE ARTHROPLASTY Left 12/24/2017   Procedure: LEFT TOTAL KNEE ARTHROPLASTY;  Surgeon: Sheral Apley, MD;  Location: [facility] OR;  Service: Orthopedics;  Laterality: Left;    There were no vitals filed for this visit.  Subjective Assessment - 01/20/18 0842    Subjective  Doing okay today. No longer using RW.     Patient Stated Goals  decrease pain, handy work, yard work    Currently in Pain?  Yes    Pain Score  6     Pain Location  Knee    Pain Orientation  Left    Pain Descriptors / Indicators  Aching         [facility] PT Assessment - 01/20/18 0001      PROM   Left Knee Flexion  96            No data recorded       [facility] Adult PT Treatment/Exercise - 01/20/18 0001      Knee/Hip Exercises: Stretches   Passive Hamstring Stretch  Both;2  reps;30 seconds    Knee: Self-Stretch to increase Flexion  Left 2 min      Knee/Hip Exercises: Aerobic   Stationary Bike  5 min within avail ROM      Knee/Hip Exercises: Standing   Heel Raises  Other (comment) toe raises, heel raises    Gait Training  swing through, heel strike, without AD & with SPC    Other Standing Knee Exercises  step over to heel strike+quad set      Knee/Hip Exercises: Supine   Straight Leg Raises  Left;Other (comment) quad set+SLR      Cryotherapy   Number Minutes Cryotherapy  15 Minutes 5 min with education    Cryotherapy Location  Knee    Type of Cryotherapy  Ice pack             PT Education - 01/20/18 0918    Education provided  Yes    Education Details  gait pattern, ice v heat, exercise form/rationale, HEP, CPM    Person(s) Educated  Patient    Methods  Explanation;Demonstration;Tactile cues;Verbal cues;Handout    Comprehension  Verbalized understanding;Need further instruction;Returned demonstration;Verbal cues required;Tactile cues required       PT Short Term Goals - 01/09/18 1312      PT SHORT TERM GOAL #1   Title  pt will be independent with HEP as it has been established    Baseline  began at eval and will progress as appropriate    Time  4    Period  Weeks    Status  New    Target Date  02/07/18 time to accomodate for MCD auth period      PT SHORT TERM GOAL #2   Title  Pt will ambulate household distances without RW    Baseline  full time use of RW at eval    Time  4    Period  Weeks    Status  New    Target Date  02/07/18        PT Long Term Goals - 01/09/18 1313      PT LONG TERM GOAL #1   Title  ROM 0-120 for functional range    Baseline  -17-74 at eval    Time  10    Period  Weeks    Status  New    Target Date  03/21/18 time to accomodate for MCD auth period      PT LONG TERM GOAL #2   Title  Pt will be able to complete all household and yard chores independently    Baseline  unable at eval    Time  10     Period  Weeks    Status  New    Target Date  03/21/18      PT LONG TERM GOAL #3   Title  average pain <=2/10 to decrease effects of pain on daily activities    Baseline  7/10 at rest at eval    Time  10    Period  Weeks    Status  New    Target Date  03/21/18      PT LONG TERM GOAL #4   Title  Pt will be independent with long term HEP for continued care of knee    Baseline  will progress as appropriate    Time  10    Period  Weeks    Status  New    Target Date  03/21/18            Plan - 01/20/18 0916    Clinical Impression Statement  Significant improvement in flexion ROM noted today, I asked him to increase CPM to 100-105. Gait pattern improved without AD and was challenged to practice as HEP.     PT Treatment/Interventions  ADLs/Self Care Home Management;Cryotherapy;Publishing copy;Therapeutic activities;Therapeutic exercise;Balance training;Patient/family education;Manual techniques;Passive range of motion;Scar mobilization;Vasopneumatic Device;Taping    PT Next Visit Plan  bike, extension ROM, cont challenging gait without AD.     PT Home Exercise Plan  quad set, heel prop, heel slides, ankle pumps, gait pattern with RW; quad set+sLR, seated HSS, gait without AD    Consulted and Agree with Plan of Care  Patient       Patient will benefit from skilled therapeutic intervention in order to improve the following deficits and impairments:  Abnormal gait, Pain, Improper body mechanics, Increased muscle spasms, Decreased strength, Decreased range of motion, Decreased activity tolerance, Difficulty walking, Impaired flexibility, Hypomobility  Visit Diagnosis: Acute pain of left knee  Stiffness of left  knee, not elsewhere classified     Problem List Patient Active Problem List   Diagnosis Date Noted  . Tobacco use disorder 12/26/2017  . Primary osteoarthritis of knee 12/24/2017  . Hypertension 11/26/2017  . High  cholesterol 11/26/2017  . Primary osteoarthritis of left knee 11/26/2017  . Primary osteoarthritis of right knee 09/16/2017    Charle Clear C. Joyice Magda PT, DPT 01/20/18 9:20 AM   [facility] [address] [place], [place], [address] Phone: [phone]   Fax:  [phone]  Name: Phillip Kennedy MRN: [id number] Date of Birth: 1955-11-24

## 2018-01-24 ENCOUNTER — Ambulatory Visit: Payer: Medicaid Other | Admitting: Physical Therapy

## 2018-01-24 ENCOUNTER — Encounter: Payer: Self-pay | Admitting: Physical Therapy

## 2018-01-24 DIAGNOSIS — M25562 Pain in left knee: Secondary | ICD-10-CM | POA: Diagnosis not present

## 2018-01-24 DIAGNOSIS — M25662 Stiffness of left knee, not elsewhere classified: Secondary | ICD-10-CM

## 2018-01-24 NOTE — Therapy (Signed)
[facility] [address] [place], [place], [address] Phone: [phone]   Fax:  [phone]  Physical Therapy Treatment  Patient Details  Name: Phillip Kennedy MRN: [id number] Date of Birth: [birth year] Referring Provider: Margarita Rana, MD   Encounter Date: 01/24/2018  PT End of Session - 01/24/18 1016    Visit Number  3    Number of Visits  4    Date for PT Re-Evaluation  02/07/18    Authorization Type  MCD auth 3 visits 3/31-4/27    Authorization - Visit Number  2    Authorization - Number of Visits  3    PT Start Time  1016    PT Stop Time  1108    PT Time Calculation (min)  52 min    Activity Tolerance  Patient tolerated treatment well    Behavior During Therapy  [facility] for tasks assessed/performed       Past Medical History:  Diagnosis Date  . Arthritis    "knees and back" (12/24/2017)  . Chronic bronchitis (HCC)   . Dyspnea   . High cholesterol   . Hypertension     Past Surgical History:  Procedure Laterality Date  . JOINT REPLACEMENT    . KNEE CARTILAGE SURGERY Bilateral   . MULTIPLE TOOTH EXTRACTIONS    . TOTAL KNEE ARTHROPLASTY Left 12/24/2017  . TOTAL KNEE ARTHROPLASTY Left 12/24/2017   Procedure: LEFT TOTAL KNEE ARTHROPLASTY;  Surgeon: Sheral Apley, MD;  Location: [facility] OR;  Service: Orthopedics;  Laterality: Left;    There were no vitals filed for this visit.  Subjective Assessment - 01/24/18 1016    Subjective  Some pain today. Getting a little better every day.     Currently in Pain?  Yes    Pain Score  6     Pain Location  Knee    Pain Orientation  Left    Pain Descriptors / Indicators  Aching;Sore stiff    Aggravating Factors   stretching    Pain Relieving Factors  rest         [facility] PT Assessment - 01/24/18 0001      PROM   Left Knee Extension  -10    Left Knee Flexion  97                   [facility] Adult PT Treatment/Exercise - 01/24/18 0001      Knee/Hip Exercises:  Stretches   Passive Hamstring Stretch  Both;2 reps;30 seconds    Knee: Self-Stretch to increase Flexion  Left 2 min    Gastroc Stretch  Both slant board & counter    Other Knee/Hip Stretches  heel prop 2# 2 min      Knee/Hip Exercises: Aerobic   Stationary Bike  5 min- able to make full revolutions 5 min also at end of tx      Knee/Hip Exercises: Standing   Wall Squat  3 sets;5 reps      Knee/Hip Exercises: Seated   Long Arc Quad  Left;10 reps 5s holds      Knee/Hip Exercises: Sidelying   Hip ABduction  Left;20 reps      Cryotherapy   Number Minutes Cryotherapy  10 Minutes    Cryotherapy Location  Knee    Type of Cryotherapy  Ice pack               PT Short Term Goals - 01/09/18 1312      PT SHORT  TERM GOAL #1   Title  pt will be independent with HEP as it has been established    Baseline  began at eval and will progress as appropriate    Time  4    Period  Weeks    Status  New    Target Date  02/07/18 time to accomodate for MCD auth period      PT SHORT TERM GOAL #2   Title  Pt will ambulate household distances without RW    Baseline  full time use of RW at eval    Time  4    Period  Weeks    Status  New    Target Date  02/07/18        PT Long Term Goals - 01/09/18 1313      PT LONG TERM GOAL #1   Title  ROM 0-120 for functional range    Baseline  -17-74 at eval    Time  10    Period  Weeks    Status  New    Target Date  03/21/18 time to accomodate for MCD auth period      PT LONG TERM GOAL #2   Title  Pt will be able to complete all household and yard chores independently    Baseline  unable at eval    Time  10    Period  Weeks    Status  New    Target Date  03/21/18      PT LONG TERM GOAL #3   Title  average pain <=2/10 to decrease effects of pain on daily activities    Baseline  7/10 at rest at eval    Time  10    Period  Weeks    Status  New    Target Date  03/21/18      PT LONG TERM GOAL #4   Title  Pt will be independent with  long term HEP for continued care of knee    Baseline  will progress as appropriate    Time  10    Period  Weeks    Status  New    Target Date  03/21/18            Plan - 01/24/18 1100    Clinical Impression Statement  Stiffness noted with exercises but is continuing to make improvements. Gait pattern WFL and reminded not to limp after standing.    PT Treatment/Interventions  ADLs/Self Care Home Management;Cryotherapy;Publishing copy;Therapeutic activities;Therapeutic exercise;Balance training;Patient/family education;Manual techniques;Passive range of motion;Scar mobilization;Vasopneumatic Device;Taping    PT Next Visit Plan  MCD ERO    PT Home Exercise Plan  quad set, heel prop, heel slides, ankle pumps, gait pattern with RW; quad set+sLR, seated HSS, gait without AD, hip abd, LAQ, heel prop, gastroc stretch    Consulted and Agree with Plan of Care  Patient       Patient will benefit from skilled therapeutic intervention in order to improve the following deficits and impairments:  Abnormal gait, Pain, Improper body mechanics, Increased muscle spasms, Decreased strength, Decreased range of motion, Decreased activity tolerance, Difficulty walking, Impaired flexibility, Hypomobility  Visit Diagnosis: Acute pain of left knee  Stiffness of left knee, not elsewhere classified     Problem List Patient Active Problem List   Diagnosis Date Noted  . Tobacco use disorder 12/26/2017  . Primary osteoarthritis of knee 12/24/2017  . Hypertension 11/26/2017  . High cholesterol 11/26/2017  . Primary osteoarthritis  of left knee 11/26/2017  . Primary osteoarthritis of right knee 09/16/2017   Shaneika Rossa C. Tonette Koehne PT, DPT 01/24/18 11:01 AM   [facility] [address] [place], [place], [address] Phone: [phone]   Fax:  [phone]  Name: Phillip Kennedy MRN: [id number] Date of Birth:  1956/03/18

## 2018-01-29 ENCOUNTER — Ambulatory Visit: Payer: Medicaid Other | Admitting: Physical Therapy

## 2018-01-29 ENCOUNTER — Encounter: Payer: Self-pay | Admitting: Physical Therapy

## 2018-01-29 DIAGNOSIS — M25562 Pain in left knee: Secondary | ICD-10-CM | POA: Diagnosis not present

## 2018-01-29 DIAGNOSIS — M25662 Stiffness of left knee, not elsewhere classified: Secondary | ICD-10-CM

## 2018-01-29 NOTE — Therapy (Signed)
[facility] [address] [place], [place], [address] Phone: [phone]   Fax:  [phone]  Physical Therapy Treatment/ERO  Patient Details  Name: Phillip Kennedy MRN: [id number] Date of Birth: 05-Apr-1956 Referring Provider: Margarita Rana, MD   Encounter Date: 01/29/2018  PT End of Session - 01/29/18 0938    Visit Number  4    Number of Visits  12 requesting 8 more visits resulting in total 12    Date for PT Re-Evaluation  03/07/18    Authorization Type  MCD re-auth submitted 4/10    Authorization - Visit Number  3    Authorization - Number of Visits  3    PT Start Time  0931    PT Stop Time  1021    PT Time Calculation (min)  50 min    Activity Tolerance  Patient tolerated treatment well    Behavior During Therapy  [facility] for tasks assessed/performed       Past Medical History:  Diagnosis Date  . Arthritis    "knees and back" (12/24/2017)  . Chronic bronchitis (HCC)   . Dyspnea   . High cholesterol   . Hypertension     Past Surgical History:  Procedure Laterality Date  . JOINT REPLACEMENT    . KNEE CARTILAGE SURGERY Bilateral   . MULTIPLE TOOTH EXTRACTIONS    . TOTAL KNEE ARTHROPLASTY Left 12/24/2017  . TOTAL KNEE ARTHROPLASTY Left 12/24/2017   Procedure: LEFT TOTAL KNEE ARTHROPLASTY;  Surgeon: Sheral Apley, MD;  Location: [facility] OR;  Service: Orthopedics;  Laterality: Left;    There were no vitals filed for this visit.  Subjective Assessment - 01/29/18 0938    Subjective  still noticing some swelling but pain is coming down. still tender and sore on medial side.     Patient Stated Goals  decrease pain, handy work, yard work    Currently in Pain?  Yes    Pain Score  3     Pain Location  Knee    Pain Orientation  Left    Pain Descriptors / Indicators  Tightness    Aggravating Factors   sleeping, stretching    Pain Relieving Factors  moving around, ice         [facility] PT Assessment - 01/29/18 0001      Assessment   Medical Diagnosis  s/p Lt TKA    Referring Provider  Margarita Rana, MD    Onset Date/Surgical Date  12/24/17      Circumferential Edema   Circumferential - Right  41    Circumferential - Left   44      PROM   Left Knee Extension  -7    Left Knee Flexion  102                   [facility] Adult PT Treatment/Exercise - 01/29/18 0001      Knee/Hip Exercises: Stretches   Knee: Self-Stretch to increase Flexion  Left 7 reps 10s holds    Gastroc Stretch  Both;2 reps;30 seconds slant board, instructed at counter also      Knee/Hip Exercises: Aerobic   Stationary Bike  5 min L1      Knee/Hip Exercises: Standing   SLS  at counter    Other Standing Knee Exercises  fwd/retro weight shift with holds      Cryotherapy   Number Minutes Cryotherapy  10 Minutes    Cryotherapy Location  Knee    Type  of Cryotherapy  Ice pack             PT Education - 01/29/18 [phone]    Education provided  Yes    Education Details  importance of ice & elevation, gradual increase in activity, healing process & pain levels, goals, s/s of DVT, goals    Person(s) Educated  Patient    Methods  Explanation    Comprehension  Verbalized understanding;Need further instruction       PT Short Term Goals - 01/29/18 0941      PT SHORT TERM GOAL #1   Title  pt will be independent with HEP as it has been established    Baseline  reports he is doing his exercises at home    Status  Achieved      PT SHORT TERM GOAL #2   Title  Pt will ambulate household distances without RW    Baseline  some use of cane outside of home but no longer usig RW    Status  Achieved        PT Long Term Goals - 01/29/18 0942      PT LONG TERM GOAL #1   Title  ROM 0-120 for functional range    Baseline  -7-102                   improved from -17-74 at eval    Status  On-going    Target Date  03/21/18      PT LONG TERM GOAL #2   Title  Pt will be able to complete all household and yard chores  independently    Baseline  has been able to do some of the yard work/household chores but not all of them, all result in significant swelling and discomfort, antalgic gait pattern noted limiting functional endurance    Status  On-going    Target Date  03/21/18      PT LONG TERM GOAL #3   Title  average pain <=2/10 to decrease effects of pain on daily activities    Baseline  3/10 today, up to 6/10 in last 48 hours    Status  On-going    Target Date  03/21/18      PT LONG TERM GOAL #4   Title  Pt will be independent with long term HEP for continued care of knee    Baseline  independent with HEP as it has been established but requires further progression to be appropriate for long term    Status  On-going    Target Date  03/21/18            Plan - 01/29/18 1441    Clinical Impression Statement  Pt has made progress toward goals since beginning PT and will continue to benefit from further treatment to achieve long term goals. Strength, ROM and functional endurance require further challenges in order to meet goals. See goal baselines for specifics to each goal.     PT Frequency  2x / week    PT Duration  4 weeks    PT Treatment/Interventions  ADLs/Self Care Home Management;Cryotherapy;Publishing copy;Therapeutic activities;Therapeutic exercise;Balance training;Patient/family education;Manual techniques;Passive range of motion;Scar mobilization;Vasopneumatic Device;Taping    PT Home Exercise Plan  quad set, heel prop, heel slides, ankle pumps, gait pattern with RW; quad set+sLR, seated HSS, gait without AD, hip abd, LAQ, heel prop, gastroc stretch    Consulted and Agree with Plan of Care  Patient  Patient will benefit from skilled therapeutic intervention in order to improve the following deficits and impairments:  Abnormal gait, Pain, Improper body mechanics, Increased muscle spasms, Decreased strength, Decreased range of motion,  Decreased activity tolerance, Difficulty walking, Impaired flexibility, Hypomobility  Visit Diagnosis: Acute pain of left knee - Plan: PT plan of care cert/re-cert  Stiffness of left knee, not elsewhere classified - Plan: PT plan of care cert/re-cert     Problem List Patient Active Problem List   Diagnosis Date Noted  . Tobacco use disorder 12/26/2017  . Primary osteoarthritis of knee 12/24/2017  . Hypertension 11/26/2017  . High cholesterol 11/26/2017  . Primary osteoarthritis of left knee 11/26/2017  . Primary osteoarthritis of right knee 09/16/2017   Yudit Modesitt C. Lulia Schriner PT, DPT 01/29/18 2:50 PM   [facility] [address] [place], [place], [address] Phone: [phone]   Fax:  [phone]  Name: Phillip Kennedy MRN: [id number] Date of Birth: May 03, 1956

## 2018-02-10 ENCOUNTER — Ambulatory Visit: Payer: Medicaid Other | Admitting: Physical Therapy

## 2018-02-12 ENCOUNTER — Ambulatory Visit: Payer: Medicaid Other | Admitting: Physical Therapy

## 2018-02-17 ENCOUNTER — Ambulatory Visit: Payer: Medicaid Other | Admitting: Physical Therapy

## 2018-02-17 ENCOUNTER — Encounter: Payer: Self-pay | Admitting: Physical Therapy

## 2018-02-17 DIAGNOSIS — M25562 Pain in left knee: Secondary | ICD-10-CM | POA: Diagnosis not present

## 2018-02-17 DIAGNOSIS — M25662 Stiffness of left knee, not elsewhere classified: Secondary | ICD-10-CM

## 2018-02-17 NOTE — Therapy (Signed)
[facility] Outpatient Rehabilitation [facility] [address] [place], [place], [address] Phone: [phone]   Fax:  [phone]  Physical Therapy Treatment  Patient Details  Name: Phillip Kennedy MRN: [id number] Date of Birth: 1955/11/21 Referring Provider: Margarita Rana, MD   Encounter Date: 02/17/2018  PT End of Session - 02/17/18 1408    Visit Number  5    Number of Visits  12    Date for PT Re-Evaluation  03/15/18    Authorization Type  MCD 8 visits 4/28-5/25    Authorization - Visit Number  1    Authorization - Number of Visits  8    PT Start Time  1408    PT Stop Time  1458    PT Time Calculation (min)  50 min    Activity Tolerance  Patient tolerated treatment well    Behavior During Therapy  [facility] for tasks assessed/performed       Past Medical History:  Diagnosis Date  . Arthritis    "knees and back" (12/24/2017)  . Chronic bronchitis (HCC)   . Dyspnea   . High cholesterol   . Hypertension     Past Surgical History:  Procedure Laterality Date  . JOINT REPLACEMENT    . KNEE CARTILAGE SURGERY Bilateral   . MULTIPLE TOOTH EXTRACTIONS    . TOTAL KNEE ARTHROPLASTY Left 12/24/2017  . TOTAL KNEE ARTHROPLASTY Left 12/24/2017   Procedure: LEFT TOTAL KNEE ARTHROPLASTY;  Surgeon: Sheral Apley, MD;  Location: [facility] OR;  Service: Orthopedics;  Laterality: Left;    There were no vitals filed for this visit.  Subjective Assessment - 02/17/18 1411    Subjective  Overall I have been having the most pain at night- stinging that is irritable. Occasional sharp pain that makes me need pain meds. Right knee is hurting more as left knee heels. I am not sure that I am ready for heavy household chores or yard work.     Patient Stated Goals  decrease pain, handy work, yard work    Currently in Pain?  Yes    Pain Score  5     Pain Location  Knee    Pain Orientation  Left    Pain Descriptors / Indicators  Aching    Pain Type  Surgical pain    Aggravating Factors    night time pain    Pain Relieving Factors  pain meds         [facility] PT Assessment - 02/17/18 0001      PROM   Left Knee Extension  -3    Left Knee Flexion  109                   [facility] Adult PT Treatment/Exercise - 02/17/18 0001      Knee/Hip Exercises: Stretches   Passive Hamstring Stretch  Both;30 seconds seated EOB    Knee: Self-Stretch to increase Flexion  Left;10 seconds 10 reps    Gastroc Stretch  Both;2 reps;30 seconds slant board      Knee/Hip Exercises: Aerobic   Stationary Bike  5 min L3      Knee/Hip Exercises: Standing   Wall Squat  2 sets;10 reps with quad set 3s holds    Other Standing Knee Exercises  wobble board A/P & Lat, static & dynamic      Knee/Hip Exercises: Supine   Straight Leg Raises  Left;15 reps cues to reduce quad lag  PT Education - 02/17/18 1423    Education provided  Yes    Education Details  anatomy of TKA- time to strengthen & be "pain-free"  HEP tolerance, exercise form/rationale    Person(s) Educated  Patient    Methods  Explanation;Demonstration;Tactile cues;Verbal cues    Comprehension  Verbalized understanding;Need further instruction;Returned demonstration;Verbal cues required;Tactile cues required       PT Short Term Goals - 01/29/18 0941      PT SHORT TERM GOAL #1   Title  pt will be independent with HEP as it has been established    Baseline  reports he is doing his exercises at home    Status  Achieved      PT SHORT TERM GOAL #2   Title  Pt will ambulate household distances without RW    Baseline  some use of cane outside of home but no longer usig RW    Status  Achieved        PT Long Term Goals - 01/29/18 0942      PT LONG TERM GOAL #1   Title  ROM 0-120 for functional range    Baseline  -7-102                   improved from -17-74 at eval    Status  On-going    Target Date  03/21/18      PT LONG TERM GOAL #2   Title  Pt will be able to complete all household and yard chores  independently    Baseline  has been able to do some of the yard work/household chores but not all of them, all result in significant swelling and discomfort, antalgic gait pattern noted limiting functional endurance    Status  On-going    Target Date  03/21/18      PT LONG TERM GOAL #3   Title  average pain <=2/10 to decrease effects of pain on daily activities    Baseline  3/10 today, up to 6/10 in last 48 hours    Status  On-going    Target Date  03/21/18      PT LONG TERM GOAL #4   Title  Pt will be independent with long term HEP for continued care of knee    Baseline  independent with HEP as it has been established but requires further progression to be appropriate for long term    Status  On-going    Target Date  03/21/18            Plan - 02/17/18 1453    Clinical Impression Statement  Limited balance ability on wobble board due to overuse of Rt LE and flexed posture for A/P control. Approx 5 deg of quad lag noted in SLR.     PT Treatment/Interventions  ADLs/Self Care Home Management;Cryotherapy;Publishing copy;Therapeutic activities;Therapeutic exercise;Balance training;Patient/family education;Manual techniques;Passive range of motion;Scar mobilization;Vasopneumatic Device;Taping    PT Next Visit Plan  Quad strength, balance- static and dynamic    PT Home Exercise Plan  quad set, heel prop, heel slides, ankle pumps, gait pattern with RW; quad set+sLR, seated HSS, gait without AD, hip abd, LAQ, heel prop, gastroc stretch    Consulted and Agree with Plan of Care  Patient       Patient will benefit from skilled therapeutic intervention in order to improve the following deficits and impairments:  Abnormal gait, Pain, Improper body mechanics, Increased muscle spasms, Decreased strength, Decreased range of motion, Decreased  activity tolerance, Difficulty walking, Impaired flexibility, Hypomobility  Visit Diagnosis: Acute pain of  left knee  Stiffness of left knee, not elsewhere classified     Problem List Patient Active Problem List   Diagnosis Date Noted  . Tobacco use disorder 12/26/2017  . Primary osteoarthritis of knee 12/24/2017  . Hypertension 11/26/2017  . High cholesterol 11/26/2017  . Primary osteoarthritis of left knee 11/26/2017  . Primary osteoarthritis of right knee 09/16/2017   Gabbi Whetstone C. Latavia Goga PT, DPT 02/17/18 2:59 PM   [facility] Outpatient Rehabilitation [facility] [address] [place], [place], [address] Phone: [phone]   Fax:  [phone]  Name: BENICIO MANNA MRN: [id number] Date of Birth: 22-Aug-1956

## 2018-02-19 ENCOUNTER — Ambulatory Visit: Payer: Medicaid Other | Admitting: Physical Therapy

## 2018-02-24 ENCOUNTER — Ambulatory Visit: Payer: Medicaid Other | Attending: Cardiovascular Disease | Admitting: Physical Therapy

## 2018-02-24 ENCOUNTER — Encounter: Payer: Self-pay | Admitting: Physical Therapy

## 2018-02-24 DIAGNOSIS — M25562 Pain in left knee: Secondary | ICD-10-CM | POA: Insufficient documentation

## 2018-02-24 DIAGNOSIS — M25662 Stiffness of left knee, not elsewhere classified: Secondary | ICD-10-CM | POA: Insufficient documentation

## 2018-02-24 NOTE — Therapy (Signed)
[facility] Outpatient Rehabilitation [facility] [address] [place], [place], [address] Phone: [phone]   Fax:  [phone]  Physical Therapy Treatment  Patient Details  Name: Phillip Kennedy MRN: [id number] Date of Birth: [birth year] Referring Provider: Margarita Rana, MD   Encounter Date: 02/24/2018  PT End of Session - 02/24/18 1500    Visit Number  6    Number of Visits  12    Date for PT Re-Evaluation  03/15/18    Authorization Type  MCD 8 visits 4/28-5/25    PT Start Time  1415    PT Stop Time  1459    PT Time Calculation (min)  44 min    Activity Tolerance  Patient tolerated treatment well    Behavior During Therapy  [facility] for tasks assessed/performed       Past Medical History:  Diagnosis Date  . Arthritis    "knees and back" (12/24/2017)  . Chronic bronchitis (HCC)   . Dyspnea   . High cholesterol   . Hypertension     Past Surgical History:  Procedure Laterality Date  . JOINT REPLACEMENT    . KNEE CARTILAGE SURGERY Bilateral   . MULTIPLE TOOTH EXTRACTIONS    . TOTAL KNEE ARTHROPLASTY Left 12/24/2017  . TOTAL KNEE ARTHROPLASTY Left 12/24/2017   Procedure: LEFT TOTAL KNEE ARTHROPLASTY;  Surgeon: Sheral Apley, MD;  Location: [facility] OR;  Service: Orthopedics;  Laterality: Left;    There were no vitals filed for this visit.  Subjective Assessment - 02/24/18 1417    Subjective  I am feeling good today, the pain still comes and goes. I feel I have improved a lot in PT. I feel like I still need to work on my knee "angles" and range in my knee.     Patient Stated Goals  decrease pain, handy work, yard work    Currently in Pain?  Yes    Pain Score  5     Pain Location  Knee    Pain Orientation  Left    Pain Descriptors / Indicators  Nagging    Pain Type  Surgical pain    Pain Radiating Towards  none     Pain Onset  More than a month ago    Pain Frequency  Intermittent    Aggravating Factors   laying down at night     Pain Relieving Factors  PT,  medication for pain          [facility] PT Assessment - 02/24/18 0001      ROM / Strength   AROM / PROM / Strength  AROM      AROM   AROM Assessment Site  Knee    Right/Left Knee  Left    Left Knee Extension  11    Left Knee Flexion  105                   [facility] Adult PT Treatment/Exercise - 02/24/18 0001      Knee/Hip Exercises: Stretches   Passive Hamstring Stretch  Left;3 reps;30 seconds      Knee/Hip Exercises: Standing   Terminal Knee Extension  Left;1 set;15 reps 3 second holds     Other Standing Knee Exercises  hip hikes 1x10 B       Knee/Hip Exercises: Supine   Quad Sets  Left;1 set;15 reps 3 second holds     Short Arc [employer]  Left;1 set;15 reps 0#    Straight Leg Raises  Left;1  set;15 reps      Knee/Hip Exercises: Prone   Other Prone Exercises  prone knee hang 4 minutes with 2#       Manual Therapy   Manual Therapy  Soft tissue mobilization;Passive ROM;Edema management;Joint mobilization    Manual therapy comments  separate from all other skilled services     Edema Management  retrograde massage with LE elevated     Joint Mobilization  patella mobility all directions     Passive ROM  knee extension ROM              PT Education - 02/24/18 1459    Education provided  Yes    Education Details  purpose and anatomy of exercises today, precautions with ice     Person(s) Educated  Patient    Methods  Explanation    Comprehension  Verbalized understanding;Need further instruction       PT Short Term Goals - 01/29/18 0941      PT SHORT TERM GOAL #1   Title  pt will be independent with HEP as it has been established    Baseline  reports he is doing his exercises at home    Status  Achieved      PT SHORT TERM GOAL #2   Title  Pt will ambulate household distances without RW    Baseline  some use of cane outside of home but no longer usig RW    Status  Achieved        PT Long Term Goals - 01/29/18 0942      PT LONG TERM GOAL #1   Title   ROM 0-120 for functional range    Baseline  -7-102                   improved from -17-74 at eval    Status  On-going    Target Date  03/21/18      PT LONG TERM GOAL #2   Title  Pt will be able to complete all household and yard chores independently    Baseline  has been able to do some of the yard work/household chores but not all of them, all result in significant swelling and discomfort, antalgic gait pattern noted limiting functional endurance    Status  On-going    Target Date  03/21/18      PT LONG TERM GOAL #3   Title  average pain <=2/10 to decrease effects of pain on daily activities    Baseline  3/10 today, up to 6/10 in last 48 hours    Status  On-going    Target Date  03/21/18      PT LONG TERM GOAL #4   Title  Pt will be independent with long term HEP for continued care of knee    Baseline  independent with HEP as it has been established but requires further progression to be appropriate for long term    Status  On-going    Target Date  03/21/18            Plan - 02/24/18 1500    Clinical Impression Statement  Focused primarily on techniques and exercises for L knee extension today due to AROM measurement of 11 degrees this afternoon. Knee remains fairly stiff and patient continues to benefit from skilled interventions targeting ROM at this point. Recommend progression to strength based activities as ROM improves. HEP updates provided today.     Rehab Potential  Good  PT Frequency  2x / week    PT Duration  4 weeks    PT Treatment/Interventions  ADLs/Self Care Home Management;Cryotherapy;Publishing copy;Therapeutic activities;Therapeutic exercise;Balance training;Patient/family education;Manual techniques;Passive range of motion;Scar mobilization;Vasopneumatic Device;Taping    PT Next Visit Plan  Quad strength, balance- static and dynamic, knee ROM for extension     PT Home Exercise Plan  quad set, heel prop, heel  slides, ankle pumps, gait pattern with RW; quad set+sLR, seated HSS, gait without AD, hip abd, LAQ, heel prop, gastroc stretch    Consulted and Agree with Plan of Care  Patient       Patient will benefit from skilled therapeutic intervention in order to improve the following deficits and impairments:  Abnormal gait, Pain, Improper body mechanics, Increased muscle spasms, Decreased strength, Decreased range of motion, Decreased activity tolerance, Difficulty walking, Impaired flexibility, Hypomobility  Visit Diagnosis: Acute pain of left knee  Stiffness of left knee, not elsewhere classified     Problem List Patient Active Problem List   Diagnosis Date Noted  . Tobacco use disorder 12/26/2017  . Primary osteoarthritis of knee 12/24/2017  . Hypertension 11/26/2017  . High cholesterol 11/26/2017  . Primary osteoarthritis of left knee 11/26/2017  . Primary osteoarthritis of right knee 09/16/2017    Nedra Hai PT, DPT, CBIS  Supplemental Physical Therapist [facility]   Pager [phone]   [facility] Outpatient Rehabilitation [facility] [address] [place], [place], [address] Phone: [phone]   Fax:  [phone]  Name: OLIVER HEITZENRATER MRN: [id number] Date of Birth: 02-19-1956

## 2018-02-24 NOTE — Patient Instructions (Signed)
   PRONE KNEE HANGS  While lying down on your stomach, allow your leg to hang off the end of a table/bed. Position yourself so that your knee cap is just over the end of the table/bed.   Just relax your body and allow gravity to stretch your knee into a more straightened position.  You may add an ankle weight or a grocery bag with a couple of cans in it for added weight and pulling.  Repeat 2 times per day for at least 5 minutes.

## 2018-02-26 ENCOUNTER — Encounter: Payer: Self-pay | Admitting: Physical Therapy

## 2018-02-26 ENCOUNTER — Ambulatory Visit: Payer: Medicaid Other | Admitting: Physical Therapy

## 2018-02-26 DIAGNOSIS — M25562 Pain in left knee: Secondary | ICD-10-CM

## 2018-02-26 DIAGNOSIS — M25662 Stiffness of left knee, not elsewhere classified: Secondary | ICD-10-CM

## 2018-02-26 NOTE — Therapy (Signed)
[facility] Outpatient Rehabilitation [facility] [address] [place], [place], [address] Phone: [phone]   Fax:  [phone]  Physical Therapy Treatment  Patient Details  Name: Phillip Kennedy MRN: [id number] Date of Birth: 1956/05/20 Referring Provider: Margarita Rana, MD   Encounter Date: 02/26/2018    Past Medical History:  Diagnosis Date  . Arthritis    "knees and back" (12/24/2017)  . Chronic bronchitis (HCC)   . Dyspnea   . High cholesterol   . Hypertension     Past Surgical History:  Procedure Laterality Date  . JOINT REPLACEMENT    . KNEE CARTILAGE SURGERY Bilateral   . MULTIPLE TOOTH EXTRACTIONS    . TOTAL KNEE ARTHROPLASTY Left 12/24/2017  . TOTAL KNEE ARTHROPLASTY Left 12/24/2017   Procedure: LEFT TOTAL KNEE ARTHROPLASTY;  Surgeon: Sheral Apley, MD;  Location: [facility] OR;  Service: Orthopedics;  Laterality: Left;    There were no vitals filed for this visit.  Patient arrived early for his appointment and left prior to being seen.  He told the front desk ladies he needed to go to the hospital ( to see his wife) NO PT today                              PT Short Term Goals - 01/29/18 0941      PT SHORT TERM GOAL #1   Title  pt will be independent with HEP as it has been established    Baseline  reports he is doing his exercises at home    Status  Achieved      PT SHORT TERM GOAL #2   Title  Pt will ambulate household distances without RW    Baseline  some use of cane outside of home but no longer usig RW    Status  Achieved        PT Long Term Goals - 01/29/18 0942      PT LONG TERM GOAL #1   Title  ROM 0-120 for functional range    Baseline  -7-102                   improved from -17-74 at eval    Status  On-going    Target Date  03/21/18      PT LONG TERM GOAL #2   Title  Pt will be able to complete all household and yard chores independently    Baseline  has been able to do some of the yard  work/household chores but not all of them, all result in significant swelling and discomfort, antalgic gait pattern noted limiting functional endurance    Status  On-going    Target Date  03/21/18      PT LONG TERM GOAL #3   Title  average pain <=2/10 to decrease effects of pain on daily activities    Baseline  3/10 today, up to 6/10 in last 48 hours    Status  On-going    Target Date  03/21/18      PT LONG TERM GOAL #4   Title  Pt will be independent with long term HEP for continued care of knee    Baseline  independent with HEP as it has been established but requires further progression to be appropriate for long term    Status  On-going    Target Date  03/21/18  Patient will benefit from skilled therapeutic intervention in order to improve the following deficits and impairments:     Visit Diagnosis: Acute pain of left knee  Stiffness of left knee, not elsewhere classified     Problem List Patient Active Problem List   Diagnosis Date Noted  . Tobacco use disorder 12/26/2017  . Primary osteoarthritis of knee 12/24/2017  . Hypertension 11/26/2017  . High cholesterol 11/26/2017  . Primary osteoarthritis of left knee 11/26/2017  . Primary osteoarthritis of right knee 09/16/2017    HARRIS,KAREN PTA 02/26/2018, 3:24 PM  [facility] [address] [place], [place], [address] Phone: [phone]   Fax:  [phone]  Name: Phillip Kennedy MRN: [id number] Date of Birth: 12/07/1955

## 2018-03-03 ENCOUNTER — Encounter: Payer: Self-pay | Admitting: Physical Therapy

## 2018-03-03 ENCOUNTER — Ambulatory Visit: Payer: Medicaid Other | Admitting: Physical Therapy

## 2018-03-03 DIAGNOSIS — M25562 Pain in left knee: Secondary | ICD-10-CM

## 2018-03-03 DIAGNOSIS — M25662 Stiffness of left knee, not elsewhere classified: Secondary | ICD-10-CM

## 2018-03-03 NOTE — Therapy (Signed)
[facility] [address] [place], [place], [address] Phone: [phone]   Fax:  [phone]  Physical Therapy Treatment  Patient Details  Name: Phillip Kennedy MRN: [id number] Date of Birth: 27-Mar-1956 Referring Provider: Margarita Rana, MD   Encounter Date: 03/03/2018  PT End of Session - 03/03/18 1442    Visit Number  7    Number of Visits  12    Date for PT Re-Evaluation  03/15/18    Authorization Type  MCD 8 visits 4/28-5/25    Authorization - Visit Number  3    Authorization - Number of Visits  8    PT Start Time  1350    PT Stop Time  1441    PT Time Calculation (min)  51 min    Activity Tolerance  Patient tolerated treatment well    Behavior During Therapy  [facility] for tasks assessed/performed       Past Medical History:  Diagnosis Date  . Arthritis    "knees and back" (12/24/2017)  . Chronic bronchitis (HCC)   . Dyspnea   . High cholesterol   . Hypertension     Past Surgical History:  Procedure Laterality Date  . JOINT REPLACEMENT    . KNEE CARTILAGE SURGERY Bilateral   . MULTIPLE TOOTH EXTRACTIONS    . TOTAL KNEE ARTHROPLASTY Left 12/24/2017  . TOTAL KNEE ARTHROPLASTY Left 12/24/2017   Procedure: LEFT TOTAL KNEE ARTHROPLASTY;  Surgeon: Sheral Apley, MD;  Location: [facility] OR;  Service: Orthopedics;  Laterality: Left;    There were no vitals filed for this visit.  Subjective Assessment - 03/03/18 1351    Subjective  I am still not pain free, it is still feeling stiff. That newest exercise we did where I'm on my stomach puts extra pressure on the knee cap, it feels like that exericse flared the knee up. It does seem like my swelling has gone down. I feel like I'm compensatng for my L knee.     Patient Stated Goals  decrease pain, handy work, yard work    Currently in Pain?  Yes    Pain Score  4     Pain Location  Knee    Pain Orientation  Left    Pain Descriptors / Indicators  Nagging    Pain Type  Surgical  pain    Pain Radiating Towards  none     Pain Onset  More than a month ago    Pain Frequency  Intermittent    Aggravating Factors   laying down at night without cushion between knees , prone knee hang     Pain Relieving Factors  pain meds          [facility] PT Assessment - 03/03/18 0001      AROM   Left Knee Extension  6    Left Knee Flexion  105                   [facility] Adult PT Treatment/Exercise - 03/03/18 0001      Knee/Hip Exercises: Stretches   Passive Hamstring Stretch  Left;3 reps;30 seconds    Quad Stretch  Left;30 seconds;3 reps      Knee/Hip Exercises: Seated   Long Arc Quad  Left;1 set;20 reps;Weights    Long Arc Quad Weight  3 lbs.      Knee/Hip Exercises: Supine   Quad Sets  Left;1 set;20 reps    Short Arc Quad Sets  Left;1 set;20 reps  3#    Straight Leg Raises  Left;1 set;20 reps      Knee/Hip Exercises: Prone   Hip Extension  Both;1 set;10 reps          Balance Exercises - 03/03/18 1423      Balance Exercises: Standing   Tandem Stance  Eyes open;3 reps;15 secs    SLS  Eyes open;Solid surface;3 reps;10 secs    Tandem Gait  Forward;Retro;3 reps        PT Education - 03/03/18 1442    Education provided  Yes    Education Details  allowing healing for total knee, HEP updates next session, activity recommendations     Person(s) Educated  Patient    Methods  Explanation    Comprehension  Verbalized understanding       PT Short Term Goals - 01/29/18 0941      PT SHORT TERM GOAL #1   Title  pt will be independent with HEP as it has been established    Baseline  reports he is doing his exercises at home    Status  Achieved      PT SHORT TERM GOAL #2   Title  Pt will ambulate household distances without RW    Baseline  some use of cane outside of home but no longer usig RW    Status  Achieved        PT Long Term Goals - 01/29/18 0942      PT LONG TERM GOAL #1   Title  ROM 0-120 for functional range    Baseline  -7-102                    improved from -17-74 at eval    Status  On-going    Target Date  03/21/18      PT LONG TERM GOAL #2   Title  Pt will be able to complete all household and yard chores independently    Baseline  has been able to do some of the yard work/household chores but not all of them, all result in significant swelling and discomfort, antalgic gait pattern noted limiting functional endurance    Status  On-going    Target Date  03/21/18      PT LONG TERM GOAL #3   Title  average pain <=2/10 to decrease effects of pain on daily activities    Baseline  3/10 today, up to 6/10 in last 48 hours    Status  On-going    Target Date  03/21/18      PT LONG TERM GOAL #4   Title  Pt will be independent with long term HEP for continued care of knee    Baseline  independent with HEP as it has been established but requires further progression to be appropriate for long term    Status  On-going    Target Date  03/21/18            Plan - 03/03/18 1442    Clinical Impression Statement  Patient arrives doing well and reporting feeling less swelling around his knee, however does report that prone knee hang seems to be flaring up his knee pain- took this exercise out of his routine due to increased pain/irritation. ROM 6-105 degrees at beginning of session today. Focused on ROM based exercises as tolerated for L knee, otherwise continued work on knee/hip strength as well as balance training today. Continue to note general quad lag and L knee stiffness moving  forward.     PT Frequency  2x / week    PT Duration  4 weeks    PT Treatment/Interventions  ADLs/Self Care Home Management;Cryotherapy;Publishing copy;Therapeutic activities;Therapeutic exercise;Balance training;Patient/family education;Manual techniques;Passive range of motion;Scar mobilization;Vasopneumatic Device;Taping    PT Next Visit Plan  Quad strength, balance- static and dynamic, knee ROM for  extension and flexion     PT Home Exercise Plan  quad set, heel prop, heel slides, ankle pumps, gait pattern with RW; quad set+sLR, seated HSS, gait without AD, hip abd, LAQ, heel prop, gastroc stretch    Consulted and Agree with Plan of Care  Patient       Patient will benefit from skilled therapeutic intervention in order to improve the following deficits and impairments:  Abnormal gait, Pain, Improper body mechanics, Increased muscle spasms, Decreased strength, Decreased range of motion, Decreased activity tolerance, Difficulty walking, Impaired flexibility, Hypomobility  Visit Diagnosis: Acute pain of left knee  Stiffness of left knee, not elsewhere classified     Problem List Patient Active Problem List   Diagnosis Date Noted  . Tobacco use disorder 12/26/2017  . Primary osteoarthritis of knee 12/24/2017  . Hypertension 11/26/2017  . High cholesterol 11/26/2017  . Primary osteoarthritis of left knee 11/26/2017  . Primary osteoarthritis of right knee 09/16/2017    Nedra Hai PT, DPT, CBIS  Supplemental Physical Therapist [facility]   Pager [phone]   [facility] [address] [place], [place], [address] Phone: [phone]   Fax:  [phone]  Name: Phillip Kennedy MRN: [id number] Date of Birth: [birth year]

## 2018-03-05 ENCOUNTER — Encounter: Payer: Self-pay | Admitting: Physical Therapy

## 2018-03-05 ENCOUNTER — Ambulatory Visit: Payer: Medicaid Other | Admitting: Physical Therapy

## 2018-03-05 DIAGNOSIS — M25662 Stiffness of left knee, not elsewhere classified: Secondary | ICD-10-CM

## 2018-03-05 DIAGNOSIS — M25562 Pain in left knee: Secondary | ICD-10-CM

## 2018-03-05 NOTE — Therapy (Signed)
[facility] [address] [place], [place], [address] Phone: [phone]   Fax:  [phone]  Physical Therapy Treatment  Patient Details  Name: Phillip Kennedy MRN: [id number] Date of Birth: [birth year] Referring Provider: Margarita Rana, MD   Encounter Date: 03/05/2018  PT End of Session - 03/05/18 1801    Visit Number  8    Number of Visits  12    Date for PT Re-Evaluation  03/15/18    Authorization - Visit Number  4    Authorization - Number of Visits  8    PT Start Time  1418    PT Stop Time  1515    PT Time Calculation (min)  57 min    Activity Tolerance  Patient tolerated treatment well    Behavior During Therapy  [facility] for tasks assessed/performed       Past Medical History:  Diagnosis Date  . Arthritis    "knees and back" (12/24/2017)  . Chronic bronchitis (HCC)   . Dyspnea   . High cholesterol   . Hypertension     Past Surgical History:  Procedure Laterality Date  . JOINT REPLACEMENT    . KNEE CARTILAGE SURGERY Bilateral   . MULTIPLE TOOTH EXTRACTIONS    . TOTAL KNEE ARTHROPLASTY Left 12/24/2017  . TOTAL KNEE ARTHROPLASTY Left 12/24/2017   Procedure: LEFT TOTAL KNEE ARTHROPLASTY;  Surgeon: Sheral Apley, MD;  Location: [facility] OR;  Service: Orthopedics;  Laterality: Left;    There were no vitals filed for this visit.  Subjective Assessment - 03/05/18 1421    Subjective  doing the exercise and is active at home.    Currently in Pain?  Yes    Pain Score  5     Pain Location  Knee    Pain Orientation  Left    Pain Descriptors / Indicators  Tightness;Nagging    Pain Frequency  Intermittent    Aggravating Factors   work around house ,  squatting to do stuff    Pain Relieving Factors  pain meds    Multiple Pain Sites  -- 8/10 right knee arthritis pain,  back stiffness long standing wears a back brace.           [facility] PT Assessment - 03/05/18 0001      AROM   Left Knee Extension  10    Left Knee Flexion   110                   [facility] Adult PT Treatment/Exercise - 03/05/18 0001      Knee/Hip Exercises: Stretches   Passive Hamstring Stretch  3 reps;30 seconds    Quad Stretch  3 reps;30 seconds;Left off edge of mat      Knee/Hip Exercises: Aerobic   Stationary Bike  1.5 miles      Knee/Hip Exercises: Standing   Heel Raises  10 reps both    Walking with Sports Cord  manual resisted walking 4 X 20 feet    Other Standing Knee Exercises  wall slide facing wall 10 X each foot forward,  Min assist with balance.  dizzy prior.       Knee/Hip Exercises: Seated   Sit to Sand  -- from higher mat cued avoid pain,  equal weight shifts      Knee/Hip Exercises: Supine   Quad Sets  20 reps;Left cues , small roll needed    Heel Slides  10 reps  Cryotherapy   Number Minutes Cryotherapy  15 Minutes    Cryotherapy Location  Knee both elevated    Type of Cryotherapy  -- cold pack      Manual Therapy   Manual Therapy  Soft tissue mobilization;Passive ROM;Edema management;Joint mobilization    Manual therapy comments  separate from all other skilled services     Edema Management  retrograde massage with LE elevated     Joint Mobilization  patella mobility all directions     Passive ROM  knee extension ROM              PT Education - 03/05/18 1800    Education provided  Yes    Education Details  gait     Person(s) Educated  Patient    Methods  Explanation;Demonstration;Verbal cues    Comprehension  Verbalized understanding;Returned demonstration       PT Short Term Goals - 01/29/18 0941      PT SHORT TERM GOAL #1   Title  pt will be independent with HEP as it has been established    Baseline  reports he is doing his exercises at home    Status  Achieved      PT SHORT TERM GOAL #2   Title  Pt will ambulate household distances without RW    Baseline  some use of cane outside of home but no longer usig RW    Status  Achieved        PT Long Term Goals - 03/05/18  1805      PT LONG TERM GOAL #1   Title  ROM 0-120 for functional range    Baseline  10 --110    Time  10    Period  Weeks    Status  On-going      PT LONG TERM GOAL #2   Title  Pt will be able to complete all household and yard chores independently    Baseline  able to do some work at home.    Time  10    Period  Weeks    Status  On-going      PT LONG TERM GOAL #3   Title  average pain <=2/10 to decrease effects of pain on daily activities    Baseline  5/10 in gym today with exercise    Time  10    Period  Weeks    Status  On-going      PT LONG TERM GOAL #4   Title  Pt will be independent with long term HEP for continued care of knee    Baseline  independent not sure if adherent  with all    Time  10    Status  On-going            Plan - 03/05/18 1802    Clinical Impression Statement  Patient has 5/10 pain at end of session.  After exercise and instruction patient was able to walk with less antalgic gait.  Care was taken to avoid flaring pain in the "Good" knee .  Patient needed min assist to avoid falling with wall exercises (Dizzy?  got up too fast).      PT Next Visit Plan  Quad strength, balance- static and dynamic, knee ROM for extension and flexion     PT Home Exercise Plan  quad set, heel prop, heel slides, ankle pumps, gait pattern with RW; quad set+sLR, seated HSS, gait without AD, hip abd, LAQ, heel prop, gastroc stretch  Consulted and Agree with Plan of Care  Patient       Patient will benefit from skilled therapeutic intervention in order to improve the following deficits and impairments:     Visit Diagnosis: Acute pain of left knee  Stiffness of left knee, not elsewhere classified     Problem List Patient Active Problem List   Diagnosis Date Noted  . Tobacco use disorder 12/26/2017  . Primary osteoarthritis of knee 12/24/2017  . Hypertension 11/26/2017  . High cholesterol 11/26/2017  . Primary osteoarthritis of left knee 11/26/2017  .  Primary osteoarthritis of right knee 09/16/2017    Jamesha Ellsworth PTA 03/05/2018, 6:07 PM  [facility] [address] [place], [place], [address] Phone: [phone]   Fax:  [phone]  Name: JAHVON DUREN MRN: [id number] Date of Birth: 12-Aug-1956

## 2018-03-10 ENCOUNTER — Ambulatory Visit: Payer: Medicaid Other | Admitting: Physical Therapy

## 2018-03-12 ENCOUNTER — Ambulatory Visit: Payer: Medicaid Other | Admitting: Physical Therapy

## 2018-03-12 ENCOUNTER — Encounter: Payer: Self-pay | Admitting: Physical Therapy

## 2018-03-12 DIAGNOSIS — M25562 Pain in left knee: Secondary | ICD-10-CM | POA: Diagnosis not present

## 2018-03-12 DIAGNOSIS — M25662 Stiffness of left knee, not elsewhere classified: Secondary | ICD-10-CM

## 2018-03-12 NOTE — Therapy (Signed)
[place], [place], [address] Phone: [phone]   Fax:  [phone]  Physical Therapy Treatment (Re-Assess/Recert/Re-auth)  Patient Details  Name: Phillip Kennedy MRN: [id number] Date of Birth: 26-Apr-1956 Referring Provider: Edmonia Lynch    Encounter Date: 03/12/2018  PT End of Session - 03/12/18 1629    Visit Number  9    Number of Visits  17    Date for PT Re-Evaluation  04/16/18    Authorization Type  MCD 8 visits 4/28-5/25; requested reauth on 5/22    Authorization - Visit Number  5    Authorization - Number of Visits  8    PT Start Time  1275    PT Stop Time  1625    PT Time Calculation (min)  39 min    Activity Tolerance  Patient tolerated treatment well    Behavior During Therapy  [facility] for tasks assessed/performed       Past Medical History:  Diagnosis Date  . Arthritis    "knees and back" (12/24/2017)  . Chronic bronchitis ([place])   . Dyspnea   . High cholesterol   . Hypertension     Past Surgical History:  Procedure Laterality Date  . JOINT REPLACEMENT    . KNEE CARTILAGE SURGERY Bilateral   . MULTIPLE TOOTH EXTRACTIONS    . TOTAL KNEE ARTHROPLASTY Left 12/24/2017  . TOTAL KNEE ARTHROPLASTY Left 12/24/2017   Procedure: LEFT TOTAL KNEE ARTHROPLASTY;  Surgeon: Renette Butters, MD;  Location: [facility];  Service: Orthopedics;  Laterality: Left;    There were no vitals filed for this visit.  Subjective Assessment - 03/12/18 1549    Subjective  I feel like I may have overdone it with yardwork this weekend, my knee became real tender and sore but it is feeling better now. I try to be careful not to do too much. I feel like I'm getting along well otherwise. It can be challenging to step up and over into the tub. I still have to put some cushion between my legs at night.     How long can you sit comfortably?  5/22-  no discomfort, just have habit of pulling leg across me and rubbing it     How  long can you stand comfortably?  5/22- 10-15 minutes     How long can you walk comfortably?  5/22- 5-10 minutes     Patient Stated Goals  decrease pain, handy work, yard work    Currently in Pain?  Yes    Pain Score  5     Pain Location  Knee    Pain Orientation  Left    Pain Descriptors / Indicators  Tightness;Nagging    Pain Type  Surgical pain    Pain Radiating Towards  none     Pain Onset  More than a month ago    Pain Frequency  Intermittent    Aggravating Factors   overdoing it     Pain Relieving Factors  pain meds, relaxing leg     Effect of Pain on Daily Activities  moderate          [facility] PT Assessment - 03/12/18 0001      Assessment   Medical Diagnosis  s/p Lt TKA    Referring Provider  Edmonia Lynch     Onset Date/Surgical Date  12/24/17    Hand Dominance  Right    Next MD Visit  Dr. Percell Miller PRN  Prior Therapy  3 home health      Precautions   Precautions  None      Restrictions   Weight Bearing Restrictions  No      Balance Screen   Has the patient fallen in the past 6 months  No    Has the patient had a decrease in activity level because of a fear of falling?   No    Is the patient reluctant to leave their home because of a fear of falling?   No      Home Film/video editor residence    Living Arrangements  Spouse/significant other    Additional Comments  one step on porch      Prior Function   Vocation Requirements  handy work      Cognition   Overall Cognitive Status  Within Functional Limits for tasks assessed      ROM / Strength   AROM / PROM / Strength  Strength      AROM   Left Knee Extension  11    Left Knee Flexion  107      Strength   Strength Assessment Site  Knee;Hip    Right/Left Hip  Left;Right    Right Hip Flexion  3+/5    Right Hip Extension  3/5    Right Hip ABduction  4+/5    Left Hip Flexion  3+/5    Left Hip Extension  3/5    Left Hip ABduction  4/5    Right/Left Knee  Right;Left    Right  Knee Flexion  5/5    Right Knee Extension  5/5    Left Knee Flexion  5/5    Left Knee Extension  5/5                   [facility] Adult PT Treatment/Exercise - 03/12/18 0001      Manual Therapy   Manual Therapy  Joint mobilization    Manual therapy comments  separate from all other skilled services     Joint Mobilization  patella mobility all directions     Passive ROM  knee extension ROM              PT Education - 03/12/18 1628    Education provided  Yes    Education Details  progress towards goals, POC moving forward, ice/elevation ongoing, Mediciad limitations and process of obtaining more appointments, general process of recoering from TKR     Person(s) Educated  Patient    Methods  Explanation    Comprehension  Verbalized understanding       PT Short Term Goals - 03/12/18 1606      PT SHORT TERM GOAL #1   Title  pt will be independent with HEP as it has been established    Baseline  5/22- going well, compoliant     Time  4    Period  Weeks    Status  Achieved      PT SHORT TERM GOAL #2   Title  Pt will ambulate household distances without RW    Baseline  5/22- met     Time  4    Period  Weeks    Status  Achieved        PT Long Term Goals - 03/12/18 1607      PT LONG TERM GOAL #1   Title  ROM 0-120 for functional range    Baseline  5/22-  11-107, ongoing ROM challenges     Time  10    Period  Weeks    Status  On-going      PT LONG TERM GOAL #2   Title  Pt will be able to complete all household and yard chores independently    Baseline  5/22- can do it but increases knee pain and stiffness     Time  10    Period  Weeks    Status  On-going      PT LONG TERM GOAL #3   Title  average pain <=2/10 to decrease effects of pain on daily activities    Baseline  5/22- 5/10     Time  10    Period  Weeks    Status  On-going      PT LONG TERM GOAL #4   Title  Pt will be independent with long term HEP for continued care of knee    Baseline  5/22-  ongoing     Time  10    Period  Weeks    Status  On-going            Plan - 03/12/18 1629    Clinical Impression Statement  Re-assessment performed today. Patient appears to be making slow but steady progress with skilled PT services, and demonstrates considerable functional gains and improvement in gait tolerance at this time. He does continue to demonstrate ongoing functional deficits including L knee stiffness, gait deviation, difficulty with stair navigation, and significant functional muscle weakness. Strongly recommend continuation of skilled PT services to further address functional deficits and reach optimal level of function moving forward.     Rehab Potential  Good    PT Frequency  2x / week    PT Duration  4 weeks    PT Treatment/Interventions  ADLs/Self Care Home Management;Cryotherapy;Dentist;Therapeutic activities;Therapeutic exercise;Balance training;Patient/family education;Manual techniques;Passive range of motion;Scar mobilization;Vasopneumatic Device;Taping    PT Next Visit Plan  Quad strength, balance- static and dynamic, knee ROM for extension and flexion     PT Home Exercise Plan  quad set, heel prop, heel slides, ankle pumps, gait pattern with RW; quad set+sLR, seated HSS, gait without AD, hip abd, LAQ, heel prop, gastroc stretch    Consulted and Agree with Plan of Care  Patient       Patient will benefit from skilled therapeutic intervention in order to improve the following deficits and impairments:  Abnormal gait, Pain, Improper body mechanics, Increased muscle spasms, Decreased strength, Decreased range of motion, Decreased activity tolerance, Difficulty walking, Impaired flexibility, Hypomobility  Visit Diagnosis: Acute pain of left knee - Plan: PT plan of care cert/re-cert  Stiffness of left knee, not elsewhere classified - Plan: PT plan of care cert/re-cert     Problem List Patient Active Problem  List   Diagnosis Date Noted  . Tobacco use disorder 12/26/2017  . Primary osteoarthritis of knee 12/24/2017  . Hypertension 11/26/2017  . High cholesterol 11/26/2017  . Primary osteoarthritis of left knee 11/26/2017  . Primary osteoarthritis of right knee 09/16/2017    Deniece Ree PT, DPT, CBIS  Supplemental Physical Therapist [place]   Pager [facility] [address] [place], [place], [address] Phone: [phone]   Fax:  [phone]  Name: RATHANA VIVEROS MRN: [id number] Date of Birth: [birth year]

## 2018-03-20 ENCOUNTER — Encounter: Payer: Self-pay | Admitting: Physical Therapy

## 2018-03-20 ENCOUNTER — Ambulatory Visit: Payer: Medicaid Other | Admitting: Physical Therapy

## 2018-03-20 DIAGNOSIS — M25562 Pain in left knee: Secondary | ICD-10-CM

## 2018-03-20 DIAGNOSIS — M25662 Stiffness of left knee, not elsewhere classified: Secondary | ICD-10-CM

## 2018-03-20 NOTE — Therapy (Signed)
Phillip Kennedy, [name], [address] Phone: [phone]   Fax:  [phone]  Physical Therapy Treatment  Patient Details  Name: Phillip Kennedy MRN: [id number] Date of Birth: 07-May-1956 Referring Provider: Edmonia Lynch    Encounter Date: 03/20/2018  PT End of Session - 03/20/18 1621    Visit Number  10    Number of Visits  17    Date for PT Re-Evaluation  04/16/18    Authorization Type  MCD 8 visits 4/28-5/25; requested reauth on 5/22  8visits approved per patient,  not yet in [place] - Visit Number  1    Authorization - Number of Visits  8    PT Start Time  1583    PT Stop Time  1500    PT Time Calculation (min)  43 min    Activity Tolerance  Patient tolerated treatment well    Behavior During Therapy  [facility] for tasks assessed/performed       Past Medical History:  Diagnosis Date  . Arthritis    "knees and back" (12/24/2017)  . Chronic bronchitis ([place])   . Dyspnea   . High cholesterol   . Hypertension     Past Surgical History:  Procedure Laterality Date  . JOINT REPLACEMENT    . KNEE CARTILAGE SURGERY Bilateral   . MULTIPLE TOOTH EXTRACTIONS    . TOTAL KNEE ARTHROPLASTY Left 12/24/2017  . TOTAL KNEE ARTHROPLASTY Left 12/24/2017   Procedure: LEFT TOTAL KNEE ARTHROPLASTY;  Surgeon: Renette Butters, MD;  Location: [place];  Service: Orthopedics;  Laterality: Left;    There were no vitals filed for this visit.  Subjective Assessment - 03/20/18 1428    Subjective  No cane.  has trouble navigating steps.  Not yet normal.       Currently in Pain?  Yes    Pain Score  2     Pain Location  Knee    Pain Orientation  Left;Anterior;Medial    Pain Descriptors / Indicators  Tightness;Nagging    Pain Type  Surgical pain    Pain Radiating Towards  shin    Pain Frequency  Intermittent    Aggravating Factors   stretching,  steps with weightbearinging    Pain Relieving Factors  relaxing lef,  ice     Multiple Pain Sites  -- right knee 5/10,  back sometimes getting out of bed in am,  feet sometimes                        [facility] Adult PT Treatment/Exercise - 03/20/18 0001      Ambulation/Gait   Gait Pattern  -- working on heel toe gait to increase knee extension with eac    Stairs  Yes    Number of Stairs  -- multiple    Height of Stairs  -- 4, 6 inches with and without rail,    Pre-Gait Activities  wall slides for weight through left single leg    Gait Comments  hip collapses with descending,  improves with cues,  gets medial knee pain if he overcorrects      Knee/Hip Exercises: Stretches   Passive Hamstring Stretch  3 reps;30 seconds    Quad Stretch  3 reps;30 seconds off edge of mat.    Other Knee/Hip Stretches  stretch into extension practiced with sitting on edge with heel resting on floor,    quad sets 10 reps,  2 sets      Knee/Hip Exercises: Aerobic   Nustep  6 minutes working on end range extension       Knee/Hip Exercises: Standing   Heel Raises  10 reps    Other Standing Knee Exercises  wall slides facing wall  able to progress to single leg stand 10 x each,  CGA for safety.       Knee/Hip Exercises: Seated   Long Arc Quad  Left;10 reps 10 second hold ,  eccentric lowering slowly    Long Arc Quad Weight  4 lbs.      Manual Therapy   Manual therapy comments  separate from all other skilled services     Joint Mobilization  patella mobility    Passive ROM  knee extension ROM              PT Education - 03/20/18 1620    Education provided  Yes    Education Details  gait,  stretching    Person(s) Educated  Patient    Methods  Demonstration;Tactile cues;Verbal cues    Comprehension  Verbalized understanding;Returned demonstration       PT Short Term Goals - 03/12/18 1606      PT SHORT TERM GOAL #1   Title  pt will be independent with HEP as it has been established    Baseline  5/22- going well, compoliant     Time  4    Period  Weeks     Status  Achieved      PT SHORT TERM GOAL #2   Title  Pt will ambulate household distances without RW    Baseline  5/22- met     Time  4    Period  Weeks    Status  Achieved        PT Long Term Goals - 03/20/18 1625      PT LONG TERM GOAL #1   Title  ROM 0-120 for functional range    Baseline  11     Time  10    Period  Weeks    Status  On-going      PT LONG TERM GOAL #2   Title  Pt will be able to complete all household and yard chores independently    Time  10    Period  Weeks    Status  Unable to assess      PT LONG TERM GOAL #3   Title  average pain <=2/10 to decrease effects of pain on daily activities    Baseline  2 to 5/10    Time  10    Period  Weeks    Status  On-going      PT LONG TERM GOAL #4   Title  Pt will be independent with long term HEP for continued care of knee    Baseline  education continues    Time  10    Period  Weeks    Status  On-going            Plan - 03/20/18 1623    Clinical Impression Statement  Patient declined the need for modalities post session.  he hadmedial knee pain on steps descending which improved with hip positioning.  Stretching extension focus today with exercise , walking and manual.      PT Next Visit Plan  Quad strength, balance- static and dynamic, knee ROM for extension and flexion     PT Home Exercise Plan  quad  set, heel prop, heel slides, ankle pumps, gait pattern with RW; quad set+sLR, seated HSS, gait without AD, hip abd, LAQ, heel prop, gastroc stretch    Consulted and Agree with Plan of Care  Patient       Patient will benefit from skilled therapeutic intervention in order to improve the following deficits and impairments:     Visit Diagnosis: Acute pain of left knee  Stiffness of left knee, not elsewhere classified     Problem List Patient Active Problem List   Diagnosis Date Noted  . Tobacco use disorder 12/26/2017  . Primary osteoarthritis of knee 12/24/2017  . Hypertension 11/26/2017   . High cholesterol 11/26/2017  . Primary osteoarthritis of left knee 11/26/2017  . Primary osteoarthritis of right knee 09/16/2017    Burhanuddin Kohlmann PTA 03/20/2018, 4:27 PM  [facility] [address] [place], [name], [address] Phone: [phone]   Fax:  [phone]  Name: Phillip Kennedy MRN: [id number] Date of Birth: 02-11-1956

## 2018-03-27 ENCOUNTER — Encounter: Payer: Self-pay | Admitting: Physical Therapy

## 2018-03-27 ENCOUNTER — Ambulatory Visit: Payer: Medicaid Other | Attending: Cardiovascular Disease | Admitting: Physical Therapy

## 2018-03-27 DIAGNOSIS — M25562 Pain in left knee: Secondary | ICD-10-CM

## 2018-03-27 DIAGNOSIS — M25662 Stiffness of left knee, not elsewhere classified: Secondary | ICD-10-CM | POA: Diagnosis present

## 2018-03-27 NOTE — Therapy (Signed)
[place], [name], [address] Phone: [phone]   Fax:  [phone]  Physical Therapy Treatment (Discharge)  Patient Details  Name: Phillip Kennedy MRN: [id number] Date of Birth: 04-30-1956 Referring Provider: Edmonia Lynch    Encounter Date: 03/27/2018   PHYSICAL THERAPY DISCHARGE SUMMARY  Visits from Start of Care: 11  Current functional level related to goals / functional outcomes:  Patient arrives requesting discharge today as he is able to do all that he needs and wants, continues to request DC following review of remaining deficits by PT. DC today per patient request, however patient is aware PT recommends return to PT if knee does not continue to improve or acute problems arise.    Remaining deficits: See below    Education / Equipment: See below  Plan: Patient agrees to discharge.  Patient goals were partially met. Patient is being discharged due to the patient's request.  ?????       PT End of Session - 03/27/18 1228    Visit Number  11    Number of Visits  11    Date for PT Re-Evaluation  03/27/18    Authorization Type  MCD 8 visits 4/28-5/25; requested reauth on 5/22  8visits approved per patient,  not yet in EPIC    Authorization - Visit Number  2    Authorization - Number of Visits  8    PT Start Time  8850    PT Stop Time  1219    PT Time Calculation (min)  32 min    Activity Tolerance  Patient tolerated treatment well    Behavior During Therapy  [facility] for tasks assessed/performed       Past Medical History:  Diagnosis Date  . Arthritis    "knees and back" (12/24/2017)  . Chronic bronchitis ([place])   . Dyspnea   . High cholesterol   . Hypertension     Past Surgical History:  Procedure Laterality Date  . JOINT REPLACEMENT    . KNEE CARTILAGE SURGERY Bilateral   . MULTIPLE TOOTH EXTRACTIONS    . TOTAL KNEE ARTHROPLASTY Left 12/24/2017  . TOTAL KNEE ARTHROPLASTY Left 12/24/2017    Procedure: LEFT TOTAL KNEE ARTHROPLASTY;  Surgeon: Renette Butters, MD;  Location: [place];  Service: Orthopedics;  Laterality: Left;    There were no vitals filed for this visit.  Subjective Assessment - 03/27/18 1149    Subjective  I'm not sure if I need to keep coming, I feel like my range of motion is OK, sometimes I still have pain but I'm not sure therapy can really help with this or what. I feel like I can do everything I need and want to within reason. Would like to re-assess today and see if I'm OK to be discharged, it has been 2 weeks since I was able to come last due to waiting fo rinsurance authorization.     How long can you sit comfortably?  6/6- no limits     How long can you stand comfortably?  6/6- 10-15 minutes     How long can you walk comfortably?  6/6- maybe 15-20 minutes     Patient Stated Goals  decrease pain, handy work, yard work    Currently in Pain?  Yes    Pain Score  4     Pain Location  Knee    Pain Orientation  Left    Pain Descriptors / Indicators  Tightness;Nagging  Pain Type  Surgical pain    Pain Radiating Towards  none     Pain Onset  More than a month ago    Pain Frequency  Intermittent    Aggravating Factors   depends     Pain Relieving Factors  depends     Effect of Pain on Daily Activities  mild          [facility] PT Assessment - 03/27/18 0001      AROM   AROM Assessment Site  Knee    Right/Left Knee  Right    Right Knee Extension  4    Right Knee Flexion  117    Left Knee Extension  11    Left Knee Flexion  110      Strength   Right Hip Extension  3+/5    Right Hip ABduction  4+/5    Left Hip Extension  3+/5    Left Hip ABduction  4+/5    Right Knee Flexion  5/5    Right Knee Extension  5/5    Left Knee Flexion  5/5    Left Knee Extension  5/5      Ambulation/Gait   Stairs  Yes    Stairs Assistance  6: Modified independent (Device/Increase time)    Stair Management Technique  No rails;Alternating pattern    Gait Comments   mild weakness and lack of eccentric control on stiars but steady and stable with reciprocal pattern, no UEs       High Level Balance   High Level Balance Comments  able to maintain SLS 15-30 seconds with light toe touches iwth other leg                            PT Education - 03/27/18 1227    Education provided  Yes    Education Details  review of progress with skilled PT services, DC only if he continues with ROM based exercise program independently, exercise and activity recommendations moving forward, may return to PT with new MD order if necessary; DC today and remaining appointments cancelled     Person(s) Educated  Patient    Methods  Explanation    Comprehension  Verbalized understanding       PT Short Term Goals - 03/27/18 1229      PT SHORT TERM GOAL #1   Title  pt will be independent with HEP as it has been established    Baseline  5/22- going well, compoliant     Period  Weeks    Status  Achieved      PT SHORT TERM GOAL #2   Title  Pt will ambulate household distances without RW    Baseline  5/22- met     Period  Weeks    Status  Achieved        PT Long Term Goals - 03/27/18 1229      PT LONG TERM GOAL #1   Title  ROM 0-120 for functional range    Baseline  6/6- 11-110    Time  10    Period  Weeks    Status  Not Met      PT LONG TERM GOAL #2   Title  Pt will be able to complete all household and yard chores independently    Baseline  6/6- reports ability to do so with incresaing confidence     Time  10    Period  Weeks    Status  Achieved      PT LONG TERM GOAL #3   Title  average pain <=2/10 to decrease effects of pain on daily activities    Baseline  6/6- ranges from 3-4/10 to 8/10 but higher pain levels are less frequent and he is able to work thorugh them on his own     Time  10    Period  Weeks    Status  Not Met      PT LONG TERM GOAL #4   Title  Pt will be independent with long term HEP for continued care of knee     Time  10    Period  Weeks    Status  Achieved            Plan - 03/27/18 1228    Clinical Impression Statement  Patient arrives today requesting another re-assessment as he feels he is ready to be discharged, reports he is doing well with minimal difficulty with functional tasks and activities throughout his day; his main compliant at this time is fluctuating levels of pain in his knee as well as L knee stiffness. Examination reveals L knee stiffness, mild proximal weakness, and mild deviation regarding stair navigation as main remaining deficits at this time. Patient confident in his home exercise program and continues to state that he would like to be discharged at this time following education regarding functional deficits. He has progressed well with skilled PT services overall and does appear appropriate for independent program at this time, DC today per patient request.     Rehab Potential  Good    PT Next Visit Plan  DC today to independent program per patient request     PT Home Exercise Plan  quad set, heel prop, heel slides, ankle pumps, gait pattern with RW; quad set+sLR, seated HSS, gait without AD, hip abd, LAQ, heel prop, gastroc stretch    Consulted and Agree with Plan of Care  Patient       Patient will benefit from skilled therapeutic intervention in order to improve the following deficits and impairments:  Abnormal gait, Pain, Improper body mechanics, Increased muscle spasms, Decreased strength, Decreased range of motion, Decreased activity tolerance, Difficulty walking, Impaired flexibility, Hypomobility  Visit Diagnosis: Acute pain of left knee  Stiffness of left knee, not elsewhere classified     Problem List Patient Active Problem List   Diagnosis Date Noted  . Tobacco use disorder 12/26/2017  . Primary osteoarthritis of knee 12/24/2017  . Hypertension 11/26/2017  . High cholesterol 11/26/2017  . Primary osteoarthritis of left knee 11/26/2017  . Primary  osteoarthritis of right knee 09/16/2017    Deniece Ree PT, DPT, CBIS  Supplemental Physical Therapist [place]   Pager [facility] [address] [place], [name], [address] Phone: [phone]   Fax:  [phone]  Name: Phillip Kennedy MRN: [id number] Date of Birth: 1955-11-22

## 2018-04-01 ENCOUNTER — Ambulatory Visit: Payer: Medicaid Other | Admitting: Physical Therapy

## 2018-04-03 ENCOUNTER — Ambulatory Visit: Payer: Medicaid Other | Admitting: Physical Therapy

## 2018-04-08 ENCOUNTER — Encounter: Payer: Medicaid Other | Admitting: Physical Therapy

## 2018-04-10 ENCOUNTER — Encounter: Payer: Medicaid Other | Admitting: Physical Therapy

## 2018-04-15 ENCOUNTER — Encounter: Payer: Medicaid Other | Admitting: Physical Therapy

## 2018-04-17 ENCOUNTER — Encounter: Payer: Medicaid Other | Admitting: Physical Therapy

## 2020-03-26 ENCOUNTER — Encounter (HOSPITAL_COMMUNITY): Payer: Self-pay

## 2020-03-26 ENCOUNTER — Inpatient Hospital Stay (HOSPITAL_COMMUNITY)
Admission: EM | Admit: 2020-03-26 | Discharge: 2020-03-31 | DRG: 603 | Disposition: A | Discharge status: Home / Self Care | Payer: Medicaid Other | Attending: Cardiology | Admitting: Cardiology

## 2020-03-26 DIAGNOSIS — J449 Chronic obstructive pulmonary disease, unspecified: Secondary | ICD-10-CM | POA: Diagnosis present

## 2020-03-26 DIAGNOSIS — M7989 Other specified soft tissue disorders: Secondary | ICD-10-CM | POA: Diagnosis present

## 2020-03-26 DIAGNOSIS — Z20822 Contact with and (suspected) exposure to covid-19: Secondary | ICD-10-CM | POA: Diagnosis present

## 2020-03-26 DIAGNOSIS — M79661 Pain in right lower leg: Secondary | ICD-10-CM

## 2020-03-26 DIAGNOSIS — I872 Venous insufficiency (chronic) (peripheral): Secondary | ICD-10-CM | POA: Diagnosis present

## 2020-03-26 DIAGNOSIS — M1990 Unspecified osteoarthritis, unspecified site: Secondary | ICD-10-CM | POA: Diagnosis present

## 2020-03-26 DIAGNOSIS — Z7901 Long term (current) use of anticoagulants: Secondary | ICD-10-CM

## 2020-03-26 DIAGNOSIS — Z79899 Other long term (current) drug therapy: Secondary | ICD-10-CM

## 2020-03-26 DIAGNOSIS — E669 Obesity, unspecified: Secondary | ICD-10-CM | POA: Diagnosis present

## 2020-03-26 DIAGNOSIS — N289 Disorder of kidney and ureter, unspecified: Secondary | ICD-10-CM

## 2020-03-26 DIAGNOSIS — I2510 Atherosclerotic heart disease of native coronary artery without angina pectoris: Secondary | ICD-10-CM | POA: Diagnosis present

## 2020-03-26 DIAGNOSIS — E785 Hyperlipidemia, unspecified: Secondary | ICD-10-CM | POA: Diagnosis present

## 2020-03-26 DIAGNOSIS — Z6831 Body mass index (BMI) 31.0-31.9, adult: Secondary | ICD-10-CM

## 2020-03-26 DIAGNOSIS — L03115 Cellulitis of right lower limb: Principal | ICD-10-CM | POA: Diagnosis present

## 2020-03-26 DIAGNOSIS — N183 Chronic kidney disease, stage 3 unspecified: Secondary | ICD-10-CM | POA: Diagnosis present

## 2020-03-26 DIAGNOSIS — I129 Hypertensive chronic kidney disease with stage 1 through stage 4 chronic kidney disease, or unspecified chronic kidney disease: Secondary | ICD-10-CM | POA: Diagnosis present

## 2020-03-26 DIAGNOSIS — Z96652 Presence of left artificial knee joint: Secondary | ICD-10-CM | POA: Diagnosis present

## 2020-03-26 DIAGNOSIS — F17210 Nicotine dependence, cigarettes, uncomplicated: Secondary | ICD-10-CM | POA: Diagnosis present

## 2020-03-26 DIAGNOSIS — E1122 Type 2 diabetes mellitus with diabetic chronic kidney disease: Secondary | ICD-10-CM | POA: Diagnosis present

## 2020-03-26 LAB — CBC
HCT: 41.2 % (ref 39.0–52.0)
Hemoglobin: 12.9 g/dL — ABNORMAL LOW (ref 13.0–17.0)
MCH: 27.8 pg (ref 26.0–34.0)
MCHC: 31.3 g/dL (ref 30.0–36.0)
MCV: 88.8 fL (ref 80.0–100.0)
Platelets: 127 10*3/uL — ABNORMAL LOW (ref 150–400)
RBC: 4.64 MIL/uL (ref 4.22–5.81)
RDW: 13.1 % (ref 11.5–15.5)
WBC: 10.4 10*3/uL (ref 4.0–10.5)
nRBC: 0 % (ref 0.0–0.2)

## 2020-03-26 LAB — BASIC METABOLIC PANEL
Anion gap: 8 (ref 5–15)
BUN: 20 mg/dL (ref 8–23)
CO2: 23 mmol/L (ref 22–32)
Calcium: 8.9 mg/dL (ref 8.9–10.3)
Chloride: 104 mmol/L (ref 98–111)
Creatinine, Ser: 1.81 mg/dL — ABNORMAL HIGH (ref 0.61–1.24)
GFR calc Af Amer: 45 mL/min — ABNORMAL LOW (ref >60)
GFR calc non Af Amer: 39 mL/min — ABNORMAL LOW (ref >60)
Glucose, Bld: 134 mg/dL — ABNORMAL HIGH (ref 70–99)
Potassium: 3.9 mmol/L (ref 3.5–5.1)
Sodium: 135 mmol/L (ref 135–145)

## 2020-03-26 NOTE — ED Triage Notes (Signed)
Onset several days R LE pain and swelling.  Denies chest pain and shortness of breath.

## 2020-03-26 NOTE — ED Provider Notes (Signed)
[facility] EMERGENCY DEPARTMENT Provider Note   CSN: [id number] Arrival date & time: 03/26/20  1659     History Chief Complaint  Patient presents with  . Leg Swelling    Phillip Kennedy is a 64 year old gentleman, with medical history significant for osteoarthritis, COPD, and CKD, presenting for evaluation of right lower extremity pain and swelling.  Reports this has been present since Thursday morning.  States he was working in the yard the day prior and wonders if something bit his leg.  Denies any chest pain or new onset shortness of breath.  Has a chronic history of dyspnea with COPD, has not worsened recently.  No known history of heart failure or liver cirrhosis.  No recent surgery or travel.  Not on any blood thinners.  Current smoker.  Does have a history of hypertension, stopped taking his medications because they made him urinate too much.  However, cannot remember the names of the medications he stopped.  Denies any lightheadedness/dizziness, headaches, visual changes, chest pain, or palpitations. Reports he follows with his primary care provider on a regular basis.         Past Medical History:  Diagnosis Date  . Arthritis    "knees and back" (12/24/2017)  . Chronic bronchitis ([facility])   . Dyspnea   . High cholesterol   . Hypertension     Patient Active Problem List   Diagnosis Date Noted  . Tobacco use disorder 12/26/2017  . Primary osteoarthritis of knee 12/24/2017  . Hypertension 11/26/2017  . High cholesterol 11/26/2017  . Primary osteoarthritis of left knee 11/26/2017  . Primary osteoarthritis of right knee 09/16/2017    Past Surgical History:  Procedure Laterality Date  . JOINT REPLACEMENT    . KNEE CARTILAGE SURGERY Bilateral   . MULTIPLE TOOTH EXTRACTIONS    . TOTAL KNEE ARTHROPLASTY Left 12/24/2017  . TOTAL KNEE ARTHROPLASTY Left 12/24/2017   Procedure: LEFT TOTAL KNEE ARTHROPLASTY;  Surgeon: Sheral Apley, MD;  Location: [facility];   Service: Orthopedics;  Laterality: Left;       History reviewed. No pertinent family history.  Social History   Tobacco Use  . Smoking status: Current Some Day Smoker    Packs/day: 0.25    Years: 49.00    Pack years: 12.25    Types: Cigarettes  . Smokeless tobacco: Never Used  Substance Use Topics  . Alcohol use: No  . Drug use: No    Home Medications Prior to Admission medications   Medication Sig Start Date End Date Taking? Authorizing Provider  albuterol (PROVENTIL HFA;VENTOLIN HFA) 108 (90 Base) MCG/ACT inhaler Inhale 1-2 puffs into the lungs every 6 (six) hours as needed for wheezing or shortness of breath. 12/09/16   Long, Arlyss Repress, MD  amLODipine (NORVASC) 5 MG tablet Take 5 mg by mouth 2 (two) times daily.    [provider]  Calcium Carb-Cholecalciferol (CALCIUM + D3 PO) Take 1 tablet by mouth daily.    [provider]  cloNIDine (CATAPRES) 0.2 MG tablet Take 0.2 mg by mouth at bedtime. 09/10/17   [provider]  docusate sodium (COLACE) 100 MG capsule Take 1 capsule (100 mg total) by mouth 2 (two) times daily. To prevent constipation while taking pain medication. 12/24/17   Albina Billet III, PA-C  hydrALAZINE (APRESOLINE) 25 MG tablet Take 25 mg by mouth 3 (three) times daily.    [provider]  losartan (COZAAR) 100 MG tablet Take 100 mg by mouth daily.  [provider]  methocarbamol (ROBAXIN) 500 MG tablet Take 1 tablet (500 mg total) by mouth every 6 (six) hours as needed for muscle spasms. 12/24/17   Albina Billet III, PA-C  metoprolol (LOPRESSOR) 100 MG tablet Take 100 mg by mouth 2 (two) times daily.    [provider]  Omega-3 Fatty Acids (FISH OIL PO) Take 1 capsule by mouth daily.    [provider]  ondansetron (ZOFRAN) 4 MG tablet Take 1 tablet (4 mg total) by mouth every 8 (eight) hours as needed for nausea or vomiting. 12/24/17   Albina Billet III, PA-C  pravastatin  (PRAVACHOL) 40 MG tablet Take 40 mg by mouth daily.    [provider]  rivaroxaban (XARELTO) 10 MG TABS tablet Take 1 tablet (10 mg total) by mouth daily. For 30 days for DVT prophylaxis 12/24/17   Albina Billet III, PA-C    Allergies    Patient has no known allergies.  Review of Systems   Review of Systems  Constitutional: Negative for chills, fatigue and fever.  Respiratory: Negative for cough and shortness of breath.   Cardiovascular: Positive for leg swelling. Negative for chest pain and palpitations.  Skin: Negative for rash.  Neurological: Negative for dizziness, syncope, weakness, light-headedness and headaches.    Physical Exam Updated Vital Signs BP (!) 171/95 (BP Location: Left Arm)   Pulse 70   Temp 97.7 F (36.5 C) (Oral)   Resp 18   Ht 5\' 9"  (1.753 m)   Wt 98 kg   SpO2 100%   BMI 31.90 kg/m   Physical Exam Constitutional:      Appearance: Normal appearance.  HENT:     Mouth/Throat:     Mouth: Mucous membranes are moist.  Cardiovascular:     Rate and Rhythm: Normal rate. Rhythm irregular.     Heart sounds: No murmur.  Pulmonary:     Effort: Pulmonary effort is normal.     Breath sounds: Normal breath sounds. No wheezing or rales.  Musculoskeletal:        General: Swelling and tenderness present.     Comments: Obvious LE swelling in comparison to right with +1 pitting edema to level of knee.  Tenderness with palpation of left lower extremity.  Warm to touch, no rash or erythema seen. No open wounds or lacerations.  Skin:    General: Skin is warm.     Findings: No lesion or rash.  Neurological:     Mental Status: He is alert.       ED Results / Procedures / Treatments   Labs (all labs ordered are listed, but only abnormal results are displayed) Labs Reviewed  CBC - Abnormal; Notable for the following components:      Result Value   Hemoglobin 12.9 (*)    Platelets 127 (*)    All other components within normal limits  BASIC  METABOLIC PANEL - Abnormal; Notable for the following components:   Glucose, Bld 134 (*)    Creatinine, Ser 1.81 (*)    GFR calc non Af Amer 39 (*)    GFR calc Af Amer 45 (*)    All other components within normal limits    EKG EKG Interpretation  Date/Time:  Saturday March 26 2020 22:10:59 EDT Ventricular Rate:  89 PR Interval:  142 QRS Duration: 88 QT Interval:  390 QTC Calculation: 474 R Axis:   71 Text Interpretation: Sinus rhythm with Premature supraventricular complexes Anterior infarct , age undetermined  Abnormal ECG artifact, plan for repeat Confirmed by Elnora Morrison [phone]) on 03/26/2020 11:53:27 PM   Radiology No results found.  Procedures Procedures (including critical care time)  Medications Ordered in ED Medications - No data to display  ED Course  I have reviewed the triage vital signs and the nursing notes.  Pertinent labs & imaging results that were available during my care of the patient were reviewed by me and considered in my medical decision making (see chart for details).  Clinical Course as of Mar 27 21  Sat Mar 26, 2020  2300 Significant right lower extremity swelling.  Unable to obtain vascular U/S this late.  Will obtain CT with contrast of region to assess for soft tissue changes/DVT.   [SB]    Clinical Course User Index [SB] Patriciaann Clan, DO   MDM Rules/Calculators/A&P                      64 year old gentleman presenting with acute onset right lower extremity swelling, concerning for DVT vs infectious etiology. While his right lower extremity is warm to touch in comparison to the left with recent history of gardening/minor cuts, he otherwise has been afebrile without erythema possibly suggesting against cellulitis. DVT RF including smoking and elevated BMI.  Doubt secondary to heart failure or liver cirrhosis given no previous history and unilateral in nature.  Reassuringly without clinical evidence of PE. Ordered CT with contrast of  right lower extremity to assess for soft tissue changes or DVT, awaiting images.   Additionally during vital intake, noted to be tachycardic via electronic monitor with irregular normal heart rate on pulse palpation.  Obtained EKG showing sinus with PVCs and ST changes, however significant artifact.  Patient denies any chest pain, palpitations, or shortness of breath at this time. Plan for repeat.   Patient case details signed out to ED provider, Dr. Roxanne Mins, at 3016 0/10/930 to follow-up CT/EKG and complete appropriate patient care.   Final Clinical Impression(s) / ED Diagnoses Final diagnoses:  Pain and swelling of right lower leg    Rx / DC Orders ED Discharge Orders    None      Patriciaann Clan, DO  Family medicine PGY-2    Patriciaann Clan, DO 03/27/20 Leandrew Koyanagi    Elnora Morrison, MD 03/27/20 [phone]

## 2020-03-27 ENCOUNTER — Other Ambulatory Visit: Payer: Self-pay

## 2020-03-27 ENCOUNTER — Emergency Department (HOSPITAL_COMMUNITY): Payer: Medicaid Other

## 2020-03-27 DIAGNOSIS — E669 Obesity, unspecified: Secondary | ICD-10-CM | POA: Diagnosis present

## 2020-03-27 DIAGNOSIS — M1990 Unspecified osteoarthritis, unspecified site: Secondary | ICD-10-CM | POA: Diagnosis present

## 2020-03-27 DIAGNOSIS — I2510 Atherosclerotic heart disease of native coronary artery without angina pectoris: Secondary | ICD-10-CM | POA: Diagnosis present

## 2020-03-27 DIAGNOSIS — E1122 Type 2 diabetes mellitus with diabetic chronic kidney disease: Secondary | ICD-10-CM | POA: Diagnosis present

## 2020-03-27 DIAGNOSIS — Z79899 Other long term (current) drug therapy: Secondary | ICD-10-CM | POA: Diagnosis not present

## 2020-03-27 DIAGNOSIS — N289 Disorder of kidney and ureter, unspecified: Secondary | ICD-10-CM | POA: Diagnosis not present

## 2020-03-27 DIAGNOSIS — L03115 Cellulitis of right lower limb: Secondary | ICD-10-CM | POA: Diagnosis present

## 2020-03-27 DIAGNOSIS — E785 Hyperlipidemia, unspecified: Secondary | ICD-10-CM | POA: Diagnosis present

## 2020-03-27 DIAGNOSIS — J449 Chronic obstructive pulmonary disease, unspecified: Secondary | ICD-10-CM | POA: Diagnosis present

## 2020-03-27 DIAGNOSIS — I129 Hypertensive chronic kidney disease with stage 1 through stage 4 chronic kidney disease, or unspecified chronic kidney disease: Secondary | ICD-10-CM | POA: Diagnosis present

## 2020-03-27 DIAGNOSIS — I872 Venous insufficiency (chronic) (peripheral): Secondary | ICD-10-CM | POA: Diagnosis present

## 2020-03-27 DIAGNOSIS — F17210 Nicotine dependence, cigarettes, uncomplicated: Secondary | ICD-10-CM | POA: Diagnosis present

## 2020-03-27 DIAGNOSIS — M7989 Other specified soft tissue disorders: Secondary | ICD-10-CM | POA: Diagnosis present

## 2020-03-27 DIAGNOSIS — Z20822 Contact with and (suspected) exposure to covid-19: Secondary | ICD-10-CM | POA: Diagnosis present

## 2020-03-27 DIAGNOSIS — Z6831 Body mass index (BMI) 31.0-31.9, adult: Secondary | ICD-10-CM | POA: Diagnosis not present

## 2020-03-27 DIAGNOSIS — Z7901 Long term (current) use of anticoagulants: Secondary | ICD-10-CM | POA: Diagnosis not present

## 2020-03-27 DIAGNOSIS — Z96652 Presence of left artificial knee joint: Secondary | ICD-10-CM | POA: Diagnosis present

## 2020-03-27 DIAGNOSIS — N183 Chronic kidney disease, stage 3 unspecified: Secondary | ICD-10-CM | POA: Diagnosis present

## 2020-03-27 LAB — HEMOGLOBIN A1C
Hgb A1c MFr Bld: 6.8 % — ABNORMAL HIGH (ref 4.8–5.6)
Mean Plasma Glucose: 148.46 mg/dL

## 2020-03-27 LAB — CBC
HCT: 37 % — ABNORMAL LOW (ref 39.0–52.0)
Hemoglobin: 11.7 g/dL — ABNORMAL LOW (ref 13.0–17.0)
MCH: 27.9 pg (ref 26.0–34.0)
MCHC: 31.6 g/dL (ref 30.0–36.0)
MCV: 88.3 fL (ref 80.0–100.0)
Platelets: 136 10*3/uL — ABNORMAL LOW (ref 150–400)
RBC: 4.19 MIL/uL — ABNORMAL LOW (ref 4.22–5.81)
RDW: 13.1 % (ref 11.5–15.5)
WBC: 7.8 10*3/uL (ref 4.0–10.5)
nRBC: 0 % (ref 0.0–0.2)

## 2020-03-27 LAB — GLUCOSE, CAPILLARY
Glucose-Capillary: 162 mg/dL — ABNORMAL HIGH (ref 70–99)
Glucose-Capillary: 111 mg/dL — ABNORMAL HIGH (ref 70–99)

## 2020-03-27 LAB — SARS CORONAVIRUS 2 BY RT PCR (HOSPITAL ORDER, PERFORMED IN ~~LOC~~ HOSPITAL LAB): SARS Coronavirus 2: NEGATIVE

## 2020-03-27 LAB — HIV ANTIBODY (ROUTINE TESTING W REFLEX): HIV Screen 4th Generation wRfx: NONREACTIVE

## 2020-03-27 LAB — CREATININE, SERUM
Creatinine, Ser: 1.92 mg/dL — ABNORMAL HIGH (ref 0.61–1.24)
GFR calc Af Amer: 42 mL/min — ABNORMAL LOW (ref >60)
GFR calc non Af Amer: 36 mL/min — ABNORMAL LOW (ref >60)

## 2020-03-27 MED ORDER — AMLODIPINE BESYLATE 5 MG PO TABS
5.0000 mg | ORAL_TABLET | Freq: Two times a day (BID) | ORAL | Status: DC
  Administered 2020-03-27 (×2): 5 mg via ORAL
  Filled 2020-03-27 (×3): qty 1

## 2020-03-27 MED ORDER — HYDRALAZINE HCL 25 MG PO TABS
25.0000 mg | ORAL_TABLET | Freq: Three times a day (TID) | ORAL | Status: DC
  Administered 2020-03-27: 25 mg via ORAL
  Filled 2020-03-27: qty 1

## 2020-03-27 MED ORDER — METOPROLOL TARTRATE 100 MG PO TABS
100.0000 mg | ORAL_TABLET | Freq: Two times a day (BID) | ORAL | Status: DC
  Administered 2020-03-27 (×2): 100 mg via ORAL
  Filled 2020-03-27: qty 4
  Filled 2020-03-27: qty 1

## 2020-03-27 MED ORDER — INSULIN ASPART 100 UNIT/ML ~~LOC~~ SOLN
0.0000 [IU] | Freq: Three times a day (TID) | SUBCUTANEOUS | Status: DC
  Administered 2020-03-27: 2 [IU] via SUBCUTANEOUS

## 2020-03-27 MED ORDER — SODIUM CHLORIDE 0.9 % IV SOLN: INTRAVENOUS | Status: DC

## 2020-03-27 MED ORDER — ALBUTEROL SULFATE (2.5 MG/3ML) 0.083% IN NEBU: 3.0000 mL | INHALATION_SOLUTION | Freq: Four times a day (QID) | RESPIRATORY_TRACT | Status: DC | PRN

## 2020-03-27 MED ORDER — SODIUM CHLORIDE 0.9% FLUSH
3.0000 mL | Freq: Two times a day (BID) | INTRAVENOUS | Status: DC
  Administered 2020-03-27 – 2020-03-31 (×6): 3 mL via INTRAVENOUS

## 2020-03-27 MED ORDER — PANTOPRAZOLE SODIUM 40 MG PO TBEC
40.0000 mg | DELAYED_RELEASE_TABLET | Freq: Every day | ORAL | Status: DC
  Administered 2020-03-27 – 2020-03-31 (×5): 40 mg via ORAL
  Filled 2020-03-27 (×5): qty 1

## 2020-03-27 MED ORDER — SODIUM CHLORIDE 0.9 % IV SOLN: 2.0000 g | Freq: Once | INTRAVENOUS | Status: DC

## 2020-03-27 MED ORDER — OMEGA-3-ACID ETHYL ESTERS 1 G PO CAPS
2.0000 g | ORAL_CAPSULE | Freq: Every day | ORAL | Status: DC
  Administered 2020-03-27 – 2020-03-31 (×5): 2 g via ORAL
  Filled 2020-03-27 (×6): qty 2

## 2020-03-27 MED ORDER — ISOSORB DINITRATE-HYDRALAZINE 20-37.5 MG PO TABS
1.0000 | ORAL_TABLET | Freq: Three times a day (TID) | ORAL | Status: DC
  Administered 2020-03-27 – 2020-03-30 (×11): 1 via ORAL
  Filled 2020-03-27 (×11): qty 1

## 2020-03-27 MED ORDER — CALCIUM CARBONATE-VITAMIN D 500-200 MG-UNIT PO TABS
1.0000 | ORAL_TABLET | Freq: Every day | ORAL | Status: DC
  Administered 2020-03-27 – 2020-03-31 (×5): 1 via ORAL
  Filled 2020-03-27 (×6): qty 1

## 2020-03-27 MED ORDER — SODIUM CHLORIDE 0.9 % IV SOLN
2.0000 g | INTRAVENOUS | Status: DC
  Administered 2020-03-28 – 2020-03-31 (×4): 2 g via INTRAVENOUS
  Filled 2020-03-27: qty 2
  Filled 2020-03-27 (×6): qty 20

## 2020-03-27 MED ORDER — CEFTRIAXONE SODIUM 2 G IJ SOLR
2.0000 g | Freq: Once | INTRAMUSCULAR | Status: AC
  Administered 2020-03-27: 2 g via INTRAVENOUS
  Filled 2020-03-27: qty 20

## 2020-03-27 MED ORDER — METOPROLOL TARTRATE 25 MG PO TABS
25.0000 mg | ORAL_TABLET | Freq: Two times a day (BID) | ORAL | Status: DC
  Administered 2020-03-27 – 2020-03-31 (×8): 25 mg via ORAL
  Filled 2020-03-27 (×8): qty 1

## 2020-03-27 MED ORDER — ACETAMINOPHEN 325 MG PO TABS
650.0000 mg | ORAL_TABLET | ORAL | Status: DC | PRN
  Administered 2020-03-27 – 2020-03-28 (×3): 650 mg via ORAL
  Filled 2020-03-27 (×3): qty 2

## 2020-03-27 MED ORDER — IOHEXOL 300 MG/ML  SOLN
80.0000 mL | Freq: Once | INTRAMUSCULAR | Status: AC | PRN
  Administered 2020-03-27: 80 mL via INTRAVENOUS

## 2020-03-27 MED ORDER — CLONIDINE HCL 0.2 MG PO TABS: 0.2000 mg | ORAL_TABLET | Freq: Every day | ORAL | Status: DC

## 2020-03-27 MED ORDER — ASPIRIN EC 81 MG PO TBEC
81.0000 mg | DELAYED_RELEASE_TABLET | Freq: Every day | ORAL | Status: DC
  Administered 2020-03-27 – 2020-03-31 (×5): 81 mg via ORAL
  Filled 2020-03-27 (×5): qty 1

## 2020-03-27 MED ORDER — DOCUSATE SODIUM 100 MG PO CAPS
100.0000 mg | ORAL_CAPSULE | Freq: Two times a day (BID) | ORAL | Status: DC
  Administered 2020-03-27 – 2020-03-31 (×10): 100 mg via ORAL
  Filled 2020-03-27 (×10): qty 1

## 2020-03-27 MED ORDER — HEPARIN SODIUM (PORCINE) 5000 UNIT/ML IJ SOLN
5000.0000 [IU] | Freq: Three times a day (TID) | INTRAMUSCULAR | Status: DC
  Administered 2020-03-27 – 2020-03-31 (×12): 5000 [IU] via SUBCUTANEOUS
  Filled 2020-03-27 (×11): qty 1

## 2020-03-27 MED ORDER — ATORVASTATIN CALCIUM 40 MG PO TABS
40.0000 mg | ORAL_TABLET | Freq: Every day | ORAL | Status: DC
  Administered 2020-03-27 – 2020-03-31 (×5): 40 mg via ORAL
  Filled 2020-03-27 (×5): qty 1

## 2020-03-27 NOTE — H&P (Signed)
Phillip Kennedy is an 64 y.o. male.   Chief Complaint: Pain and swelling in the right swelling and pain for last few days states he was working in the yard and bushes and felt something bit his leg HPI: Patient is 64 year old male with past medical history significant for hypertension, non-insulin-dependent diabetes mellitus controlled by diet, chronic kidney disease stage III, COPD, tobacco abuse smokes less than 1 pack 40+ years now smokes few cigarettes daily, degenerative joint disease history of left total knee arthroplasty and tendon repair to right knee in the past on anemia, questionable history of silent anteroseptal wall MI by EKG, came to ER complaining of progressive pain and swelling in the right leg for last few days.  States was working in the bushes felt that some insect bit him.  As pain and swelling got worse so decided to come to the ED patient denies any fever but states felt warm patient had CT of the leg in the ED which showed evidence of right leg cellulitis with no evidence of DVT.  Patient denies any chest pain or shortness of breath.  Denies palpitation lightheadedness or syncope.  Denies PND orthopnea but complains of right leg swelling.  Denies history of trauma or fall.  Denies any prolonged immobilization.  Past Medical History:  Diagnosis Date  . Arthritis    "knees and back" (12/24/2017)  . Chronic bronchitis (HCC)   . Dyspnea   . High cholesterol   . Hypertension     Past Surgical History:  Procedure Laterality Date  . JOINT REPLACEMENT    . KNEE CARTILAGE SURGERY Bilateral   . MULTIPLE TOOTH EXTRACTIONS    . TOTAL KNEE ARTHROPLASTY Left 12/24/2017  . TOTAL KNEE ARTHROPLASTY Left 12/24/2017   Procedure: LEFT TOTAL KNEE ARTHROPLASTY;  Surgeon: Sheral Apley, MD;  Location: [facility] OR;  Service: Orthopedics;  Laterality: Left;    History reviewed. No pertinent family history. Social History:  reports that he has been smoking cigarettes. He has a 12.25 pack-year  smoking history. He has never used smokeless tobacco. He reports that he does not drink alcohol or use drugs.  Allergies: No Known Allergies  Medications Prior to Admission  Medication Sig Dispense Refill  . acetaminophen (TYLENOL) 500 MG tablet Take 1,000 mg by mouth 2 (two) times daily as needed for mild pain.    Marland Kitchen albuterol (PROVENTIL HFA;VENTOLIN HFA) 108 (90 Base) MCG/ACT inhaler Inhale 1-2 puffs into the lungs every 6 (six) hours as needed for wheezing or shortness of breath. 1 Inhaler 0  . amLODipine (NORVASC) 5 MG tablet Take 5 mg by mouth 2 (two) times daily.    Marland Kitchen atorvastatin (LIPITOR) 40 MG tablet Take 1 tablet by mouth daily.    . Calcium Carb-Cholecalciferol (CALCIUM + D3 PO) Take 1 tablet by mouth daily.    . cloNIDine (CATAPRES) 0.2 MG tablet Take 0.2 mg by mouth at bedtime.  0  . docusate sodium (COLACE) 100 MG capsule Take 1 capsule (100 mg total) by mouth 2 (two) times daily. To prevent constipation while taking pain medication. 60 capsule 0  . hydrALAZINE (APRESOLINE) 25 MG tablet Take 25 mg by mouth 3 (three) times daily.    . metoprolol (LOPRESSOR) 100 MG tablet Take 100 mg by mouth 2 (two) times daily.    . Omega-3 Fatty Acids (FISH OIL PO) Take 1 capsule by mouth daily.    . methocarbamol (ROBAXIN) 500 MG tablet Take 1 tablet (500 mg total) by mouth every 6 (six)  hours as needed for muscle spasms. (Patient not taking: Reported on 03/27/2020) 40 tablet 0  . ondansetron (ZOFRAN) 4 MG tablet Take 1 tablet (4 mg total) by mouth every 8 (eight) hours as needed for nausea or vomiting. 40 tablet 0  . rivaroxaban (XARELTO) 10 MG TABS tablet Take 1 tablet (10 mg total) by mouth daily. For 30 days for DVT prophylaxis (Patient not taking: Reported on 03/27/2020) 30 tablet 0    Results for orders placed or performed during the hospital encounter of 03/26/20 (from the past 48 hour(s))  CBC     Status: Abnormal   Collection Time: 03/26/20  6:29 PM  Result Value Ref Range   WBC 10.4 4.0  - 10.5 K/uL   RBC 4.64 4.22 - 5.81 MIL/uL   Hemoglobin 12.9 (L) 13.0 - 17.0 g/dL   HCT 41.2 39.0 - 52.0 %   MCV 88.8 80.0 - 100.0 fL   MCH 27.8 26.0 - 34.0 pg   MCHC 31.3 30.0 - 36.0 g/dL   RDW 13.1 11.5 - 15.5 %   Platelets 127 (L) 150 - 400 K/uL   nRBC 0.0 0.0 - 0.2 %    Comment: Performed at [facility], [address] [place], [place] [address]  Basic metabolic panel     Status: Abnormal   Collection Time: 03/26/20  6:29 PM  Result Value Ref Range   Sodium 135 135 - 145 mmol/L   Potassium 3.9 3.5 - 5.1 mmol/L   Chloride 104 98 - 111 mmol/L   CO2 23 22 - 32 mmol/L   Glucose, Bld 134 (H) 70 - 99 mg/dL    Comment: Glucose reference range applies only to samples taken after fasting for at least 8 hours.   BUN 20 8 - 23 mg/dL   Creatinine, Ser 1.81 (H) 0.61 - 1.24 mg/dL   Calcium 8.9 8.9 - 10.3 mg/dL   GFR calc non Af Amer 39 (L) >60 mL/min   GFR calc Af Amer 45 (L) >60 mL/min   Anion gap 8 5 - 15    Comment: Performed at [facility] [address], [place], [place] [address]  SARS Coronavirus 2 by RT PCR (hospital order, performed in [facility] hospital lab) Nasopharyngeal Nasopharyngeal Swab     Status: None   Collection Time: 03/27/20  2:26 AM   Specimen: Nasopharyngeal Swab  Result Value Ref Range   SARS Coronavirus 2 NEGATIVE NEGATIVE    Comment: (NOTE) SARS-CoV-2 target nucleic acids are NOT DETECTED. The SARS-CoV-2 RNA is generally detectable in upper and lower respiratory specimens during the acute phase of infection. The lowest concentration of SARS-CoV-2 viral copies this assay can detect is 250 copies / mL. A negative result does not preclude SARS-CoV-2 infection and should not be used as the sole basis for treatment or other patient management decisions.  A negative result may occur with improper specimen collection / handling, submission of specimen other than nasopharyngeal swab, presence of viral mutation(s) within the areas targeted by this  assay, and inadequate number of viral copies (<250 copies / mL). A negative result must be combined with clinical observations, patient history, and epidemiological information. Fact Sheet for Patients:   StrictlyIdeas.no Fact Sheet for Healthcare Providers: BankingDealers.co.za This test is not yet approved or cleared  by the [place] FDA and has been authorized for detection and/or diagnosis of SARS-CoV-2 by FDA under an Emergency Use Authorization (EUA).  This EUA will remain in effect (meaning this  test can be used) for the duration of the COVID-19 declaration under Section 564(b)(1) of the Act, 21 U.S.C. section 360bbb-3(b)(1), unless the authorization is terminated or revoked sooner. Performed at [facility], [address], [place], [place] [address]    CT FOOT RIGHT W CONTRAST  Result Date: 03/27/2020 CLINICAL DATA:  Right lower extremity pain and swelling. Cellulitis versus DVT. EXAM: CT OF THE LOWER RIGHT EXTREMITY WITH CONTRAST CT OF THE RIGHT FOOT WITH CONTRAST TECHNIQUE: Multidetector CT imaging of the lower right extremity was performed according to the standard protocol following intravenous contrast administration. Imaging obtained from the knee through the foot. COMPARISON:  None. CONTRAST:  39mL OMNIPAQUE IOHEXOL 300 MG/ML  SOLN FINDINGS: TIBIA/FIBULA: Bones/Joint/Cartilage Moderate osteoarthritis of the knee. No fracture. No periosteal reaction or bony destruction. Nutrient channel in the mid tibial shaft. No focal bone lesion. Trace knee joint effusion. There is no ankle joint effusion. Ligaments Suboptimally assessed by CT. Muscles and Tendons No intramuscular collection. Normal intramuscular fatty striations persist. Achilles tendon enthesophyte. Soft tissues Marked circumferential soft tissue thickening and soft tissue edema extending from the knee to the ankle. No well-defined focal fluid collection. No soft tissue  air. There are no obvious filling defects within the venous structures of the right lower extremity to suggest DVT. Patent arterial vasculature with atherosclerosis. FOOT: Bones/Joint/Cartilage No fracture.  No bony destruction or periosteal reaction. Ligaments Suboptimally assessed by CT. Muscles and Tendons No intramuscular fluid collection.  Normal fatty striations persist. Soft tissues Circumferential soft tissue edema and skin thickening throughout the foot, most prominent about the dorsum. No focal fluid collection. No soft tissue air. IMPRESSION: 1. Marked subcutaneous edema standing from the knee through the foot, no drainable focal fluid collection. No soft tissue air. Findings may represent cellulitis in the appropriate clinical setting. 2. No filling defects in the calf veins to suggest DVT. 3. No acute osseous abnormalities. Advanced osteoarthritis of the right knee. Electronically Signed   By: Narda [name] M.D.   On: 03/27/2020 00:57   CT TIBIA FIBULA RIGHT W CONTRAST  Result Date: 03/27/2020 CLINICAL DATA:  Right lower extremity pain and swelling. Cellulitis versus DVT. EXAM: CT OF THE LOWER RIGHT EXTREMITY WITH CONTRAST CT OF THE RIGHT FOOT WITH CONTRAST TECHNIQUE: Multidetector CT imaging of the lower right extremity was performed according to the standard protocol following intravenous contrast administration. Imaging obtained from the knee through the foot. COMPARISON:  None. CONTRAST:  21mL OMNIPAQUE IOHEXOL 300 MG/ML  SOLN FINDINGS: TIBIA/FIBULA: Bones/Joint/Cartilage Moderate osteoarthritis of the knee. No fracture. No periosteal reaction or bony destruction. Nutrient channel in the mid tibial shaft. No focal bone lesion. Trace knee joint effusion. There is no ankle joint effusion. Ligaments Suboptimally assessed by CT. Muscles and Tendons No intramuscular collection. Normal intramuscular fatty striations persist. Achilles tendon enthesophyte. Soft tissues Marked circumferential soft  tissue thickening and soft tissue edema extending from the knee to the ankle. No well-defined focal fluid collection. No soft tissue air. There are no obvious filling defects within the venous structures of the right lower extremity to suggest DVT. Patent arterial vasculature with atherosclerosis. FOOT: Bones/Joint/Cartilage No fracture.  No bony destruction or periosteal reaction. Ligaments Suboptimally assessed by CT. Muscles and Tendons No intramuscular fluid collection.  Normal fatty striations persist. Soft tissues Circumferential soft tissue edema and skin thickening throughout the foot, most prominent about the dorsum. No focal fluid collection. No soft tissue air. IMPRESSION: 1. Marked subcutaneous edema standing from the knee through the foot,  no drainable focal fluid collection. No soft tissue air. Findings may represent cellulitis in the appropriate clinical setting. 2. No filling defects in the calf veins to suggest DVT. 3. No acute osseous abnormalities. Advanced osteoarthritis of the right knee. Electronically Signed   By: Narda [name] M.D.   On: 03/27/2020 00:57    Review of Systems  Constitutional: Negative for chills, diaphoresis and fatigue.  HENT: Negative for sore throat.   Eyes: Negative for itching.  Respiratory: Negative for chest tightness and shortness of breath.   Cardiovascular: Positive for leg swelling. Negative for chest pain and palpitations.  Gastrointestinal: Negative for abdominal distention.  Endocrine: Negative for heat intolerance.  Genitourinary: Negative for difficulty urinating.  Neurological: Negative for dizziness.    Blood pressure (!) 131/95, pulse 62, temperature 99.6 F (37.6 C), temperature source Oral, resp. rate 16, height 5\' 9"  (1.753 m), weight 98 kg, SpO2 99 %. Physical Exam  Constitutional: He is oriented to person, place, and time.  HENT:  Head: Normocephalic and atraumatic.  Eyes: Pupils are equal, round, and reactive to light.  Conjunctivae are normal. Left eye exhibits no discharge. No scleral icterus.  Neck: No JVD present. No tracheal deviation present. No thyromegaly present.  Cardiovascular: Normal rate and regular rhythm.  Murmur (2/6 systolic murmur noted) heard. Respiratory: Effort normal and breath sounds normal. No respiratory distress. He has no wheezes. He has no rales.  GI: Soft. Bowel sounds are normal. He exhibits no distension. There is no abdominal tenderness.  Musculoskeletal:     Cervical back: Normal range of motion and neck supple.     Comments: No clubbing cyanosis 2+ edema right leg.  Mild erythema and warmth to touch noted.  No obvious break in skin noted  Neurological: He is alert and oriented to person, place, and time.     Assessment/Plan Cellulitis right leg Hypertension Diabetes mellitus Hyperlipidemia Chronic kidney disease stage III Possible old anteroseptal wall silent MI COPD Tobacco abuse Chronic venous insufficiency Severe degenerative joint disease Plan As per orders Elevate right leg 30 degrees while in bed Check old records Dr. to follow from a.m.  Algie Coffer, MD 03/27/2020, 7:44 AM

## 2020-03-27 NOTE — ED Provider Notes (Signed)
Care assumed from Dr. Annia Friendly and Jodi Mourning, patient with pain and swelling of right lower leg pending CT angiogram to rule out DVT.  CT angiogram shows no evidence of DVT, extensive soft tissue stranding consistent with cellulitis.  Run to evaluate the patient.  The right lower leg is warm to the touch, tender with 2-3+ edema.  There is no actual erythema but the skin is hyperpigmented.  Overall picture is consistent with cellulitis.  He is started on ceftriaxone.  Given the degree of swelling, I feel that it is appropriate to begin treatment inpatient.  Case is discussed with Dr. Sharyn Lull, on-call for Dr. Algie Coffer, who agrees to admit the patient.  Results for orders placed or performed during the hospital encounter of 03/26/20  CBC  Result Value Ref Range   WBC 10.4 4.0 - 10.5 K/uL   RBC 4.64 4.22 - 5.81 MIL/uL   Hemoglobin 12.9 (L) 13.0 - 17.0 g/dL   HCT 61.4 43.1 - 54.0 %   MCV 88.8 80.0 - 100.0 fL   MCH 27.8 26.0 - 34.0 pg   MCHC 31.3 30.0 - 36.0 g/dL   RDW 08.6 76.1 - 95.0 %   Platelets 127 (L) 150 - 400 K/uL   nRBC 0.0 0.0 - 0.2 %  Basic metabolic panel  Result Value Ref Range   Sodium 135 135 - 145 mmol/L   Potassium 3.9 3.5 - 5.1 mmol/L   Chloride 104 98 - 111 mmol/L   CO2 23 22 - 32 mmol/L   Glucose, Bld 134 (H) 70 - 99 mg/dL   BUN 20 8 - 23 mg/dL   Creatinine, Ser 9.32 (H) 0.61 - 1.24 mg/dL   Calcium 8.9 8.9 - 67.1 mg/dL   GFR calc non Af Amer 39 (L) >60 mL/min   GFR calc Af Amer 45 (L) >60 mL/min   Anion gap 8 5 - 15   CT FOOT RIGHT W CONTRAST  Result Date: 03/27/2020 CLINICAL DATA:  Right lower extremity pain and swelling. Cellulitis versus DVT. EXAM: CT OF THE LOWER RIGHT EXTREMITY WITH CONTRAST CT OF THE RIGHT FOOT WITH CONTRAST TECHNIQUE: Multidetector CT imaging of the lower right extremity was performed according to the standard protocol following intravenous contrast administration. Imaging obtained from the knee through the foot. COMPARISON:  None. CONTRAST:  81mL  OMNIPAQUE IOHEXOL 300 MG/ML  SOLN FINDINGS: TIBIA/FIBULA: Bones/Joint/Cartilage Moderate osteoarthritis of the knee. No fracture. No periosteal reaction or bony destruction. Nutrient channel in the mid tibial shaft. No focal bone lesion. Trace knee joint effusion. There is no ankle joint effusion. Ligaments Suboptimally assessed by CT. Muscles and Tendons No intramuscular collection. Normal intramuscular fatty striations persist. Achilles tendon enthesophyte. Soft tissues Marked circumferential soft tissue thickening and soft tissue edema extending from the knee to the ankle. No well-defined focal fluid collection. No soft tissue air. There are no obvious filling defects within the venous structures of the right lower extremity to suggest DVT. Patent arterial vasculature with atherosclerosis. FOOT: Bones/Joint/Cartilage No fracture.  No bony destruction or periosteal reaction. Ligaments Suboptimally assessed by CT. Muscles and Tendons No intramuscular fluid collection.  Normal fatty striations persist. Soft tissues Circumferential soft tissue edema and skin thickening throughout the foot, most prominent about the dorsum. No focal fluid collection. No soft tissue air. IMPRESSION: 1. Marked subcutaneous edema standing from the knee through the foot, no drainable focal fluid collection. No soft tissue air. Findings may represent cellulitis in the appropriate clinical setting. 2. No filling defects in the calf veins  to suggest DVT. 3. No acute osseous abnormalities. Advanced osteoarthritis of the right knee. Electronically Signed   By: Keith Rake M.D.   On: 03/27/2020 00:57   CT TIBIA FIBULA RIGHT W CONTRAST  Result Date: 03/27/2020 CLINICAL DATA:  Right lower extremity pain and swelling. Cellulitis versus DVT. EXAM: CT OF THE LOWER RIGHT EXTREMITY WITH CONTRAST CT OF THE RIGHT FOOT WITH CONTRAST TECHNIQUE: Multidetector CT imaging of the lower right extremity was performed according to the standard protocol  following intravenous contrast administration. Imaging obtained from the knee through the foot. COMPARISON:  None. CONTRAST:  42mL OMNIPAQUE IOHEXOL 300 MG/ML  SOLN FINDINGS: TIBIA/FIBULA: Bones/Joint/Cartilage Moderate osteoarthritis of the knee. No fracture. No periosteal reaction or bony destruction. Nutrient channel in the mid tibial shaft. No focal bone lesion. Trace knee joint effusion. There is no ankle joint effusion. Ligaments Suboptimally assessed by CT. Muscles and Tendons No intramuscular collection. Normal intramuscular fatty striations persist. Achilles tendon enthesophyte. Soft tissues Marked circumferential soft tissue thickening and soft tissue edema extending from the knee to the ankle. No well-defined focal fluid collection. No soft tissue air. There are no obvious filling defects within the venous structures of the right lower extremity to suggest DVT. Patent arterial vasculature with atherosclerosis. FOOT: Bones/Joint/Cartilage No fracture.  No bony destruction or periosteal reaction. Ligaments Suboptimally assessed by CT. Muscles and Tendons No intramuscular fluid collection.  Normal fatty striations persist. Soft tissues Circumferential soft tissue edema and skin thickening throughout the foot, most prominent about the dorsum. No focal fluid collection. No soft tissue air. IMPRESSION: 1. Marked subcutaneous edema standing from the knee through the foot, no drainable focal fluid collection. No soft tissue air. Findings may represent cellulitis in the appropriate clinical setting. 2. No filling defects in the calf veins to suggest DVT. 3. No acute osseous abnormalities. Advanced osteoarthritis of the right knee. Electronically Signed   By: Keith Rake M.D.   On: 16/07/9603 54:09     Delora Fuel, MD 81/19/14 [phone]

## 2020-03-27 NOTE — Progress Notes (Signed)
Pt asked this RN to see if the MD will give him something for his RLE leg edema. RN messaged on call MD to see if there is something that can be given. Awaiting response.   Larey Days, RN

## 2020-03-27 NOTE — Progress Notes (Signed)
New Admission Note: ? Arrival Method: Stretcher Mental Orientation:Alert and Oriented x4 Telemetry:No Assessment: Completed Skin: Refer to flowsheet IV: Right Antecubital  Pain: 8/10  See MAR Tubes: None Safety Measures: Safety Fall Prevention Plan discussed with patient. Admission: Completed [facility] [place] Orientation: Patient has been orientated to the room, unit and the staff. Family: None Orders have been reviewed and are being implemented. Will continue to monitor the patient. Call light has been placed within reach and bed alarm has been activated.  ? Donia Guiles, RN  Phone Number: [phone]

## 2020-03-28 LAB — BASIC METABOLIC PANEL
Anion gap: 9 (ref 5–15)
BUN: 22 mg/dL (ref 8–23)
CO2: 22 mmol/L (ref 22–32)
Calcium: 8.6 mg/dL — ABNORMAL LOW (ref 8.9–10.3)
Chloride: 106 mmol/L (ref 98–111)
Creatinine, Ser: 1.68 mg/dL — ABNORMAL HIGH (ref 0.61–1.24)
GFR calc Af Amer: 49 mL/min — ABNORMAL LOW (ref >60)
GFR calc non Af Amer: 42 mL/min — ABNORMAL LOW (ref >60)
Glucose, Bld: 110 mg/dL — ABNORMAL HIGH (ref 70–99)
Potassium: 4.1 mmol/L (ref 3.5–5.1)
Sodium: 137 mmol/L (ref 135–145)

## 2020-03-28 LAB — CBC
HCT: 33.7 % — ABNORMAL LOW (ref 39.0–52.0)
Hemoglobin: 10.8 g/dL — ABNORMAL LOW (ref 13.0–17.0)
MCH: 28 pg (ref 26.0–34.0)
MCHC: 32 g/dL (ref 30.0–36.0)
MCV: 87.3 fL (ref 80.0–100.0)
Platelets: 146 10*3/uL — ABNORMAL LOW (ref 150–400)
RBC: 3.86 MIL/uL — ABNORMAL LOW (ref 4.22–5.81)
RDW: 13.2 % (ref 11.5–15.5)
WBC: 5.7 10*3/uL (ref 4.0–10.5)
nRBC: 0 % (ref 0.0–0.2)

## 2020-03-28 LAB — GLUCOSE, CAPILLARY
Glucose-Capillary: 109 mg/dL — ABNORMAL HIGH (ref 70–99)
Glucose-Capillary: 113 mg/dL — ABNORMAL HIGH (ref 70–99)
Glucose-Capillary: 102 mg/dL — ABNORMAL HIGH (ref 70–99)
Glucose-Capillary: 133 mg/dL — ABNORMAL HIGH (ref 70–99)

## 2020-03-28 MED ORDER — IBUPROFEN 200 MG PO TABS
200.0000 mg | ORAL_TABLET | Freq: Three times a day (TID) | ORAL | Status: DC
  Administered 2020-03-28 (×3): 200 mg via ORAL
  Filled 2020-03-28 (×3): qty 1

## 2020-03-28 NOTE — Plan of Care (Signed)
  Problem: Activity: Goal: Risk for activity intolerance will decrease Outcome: Progressing   

## 2020-03-28 NOTE — Progress Notes (Signed)
Ref: Orpah Cobb, MD   Subjective:  Right leg edema and pain persist.  VS stable. On IV Rocephin.  Objective:  Vital Signs in the last 24 hours: Temp:  [98 F (36.7 C)-99.3 F (37.4 C)] 98.5 F (36.9 C) (06/07 0854) Pulse Rate:  [61-69] 67 (06/07 0854) Resp:  [18] 18 (06/07 0854) BP: (112-124)/(69-92) 121/77 (06/07 0854) SpO2:  [98 %-100 %] 100 % (06/07 0854)  Physical Exam: BP Readings from Last 1 Encounters:  03/28/20 121/77     Wt Readings from Last 1 Encounters:  03/26/20 98 kg    Weight change:  Body mass index is 31.9 kg/m. HEENT: [place]/AT, Eyes-Brown, PERL, EOMI, Conjunctiva-Pink, Sclera-Non-icteric Neck: No JVD, No bruit, Trachea midline. Lungs:  Clear, Bilateral. Cardiac:  Regular rhythm, normal S1 and S2, no S3. II/VI systolic murmur. Abdomen:  Soft, non-tender. BS present. Extremities:  Diffuse swelling and tenderness of right foot and lower leg up to knee edema present. No cyanosis. No clubbing. CNS: AxOx3, Cranial nerves grossly intact, moves all 4 extremities.  Skin: Warm and dry.   Intake/Output from previous day: 06/06 0701 - 06/07 0700 In: 1736.9 [P.O.:960; I.V.:676.9; IV Piggyback:100] Out: 1105 [Urine:1105]    Lab Results: BMET    Component Value Date/Time   NA 137 03/28/2020 0619   NA 135 03/26/2020 1829   NA 140 12/12/2017 1043   K 4.1 03/28/2020 0619   K 3.9 03/26/2020 1829   K 4.1 12/12/2017 1043   CL 106 03/28/2020 0619   CL 104 03/26/2020 1829   CL 108 12/12/2017 1043   CO2 22 03/28/2020 0619   CO2 23 03/26/2020 1829   CO2 23 12/12/2017 1043   GLUCOSE 110 (H) 03/28/2020 0619   GLUCOSE 134 (H) 03/26/2020 1829   GLUCOSE 86 12/12/2017 1043   BUN 22 03/28/2020 0619   BUN 20 03/26/2020 1829   BUN 17 12/12/2017 1043   CREATININE 1.68 (H) 03/28/2020 0619   CREATININE 1.92 (H) 03/27/2020 1231   CREATININE 1.81 (H) 03/26/2020 1829   CALCIUM 8.6 (L) 03/28/2020 0619   CALCIUM 8.9 03/26/2020 1829   CALCIUM 9.1 12/12/2017 1043   GFRNONAA 42 (L) 03/28/2020 0619   GFRNONAA 36 (L) 03/27/2020 1231   GFRNONAA 39 (L) 03/26/2020 1829   GFRAA 49 (L) 03/28/2020 0619   GFRAA 42 (L) 03/27/2020 1231   GFRAA 45 (L) 03/26/2020 1829   CBC    Component Value Date/Time   WBC 5.7 03/28/2020 0619   RBC 3.86 (L) 03/28/2020 0619   HGB 10.8 (L) 03/28/2020 0619   HCT 33.7 (L) 03/28/2020 0619   PLT 146 (L) 03/28/2020 0619   MCV 87.3 03/28/2020 0619   MCH 28.0 03/28/2020 0619   MCHC 32.0 03/28/2020 0619   RDW 13.2 03/28/2020 0619   HEPATIC Function Panel No results for input(s): PROT in the last 8760 hours.  Invalid input(s):  ALBUMIN,  AST,  ALT,  ALKPHOS,  BILIDIR,  IBILI HEMOGLOBIN A1C No components found for: HGA1C,  MPG CARDIAC ENZYMES No results found for: CKTOTAL, CKMB, CKMBINDEX, TROPONINI BNP No results for input(s): PROBNP in the last 8760 hours. TSH No results for input(s): TSH in the last 8760 hours. CHOLESTEROL No results for input(s): CHOL in the last 8760 hours.  Scheduled Meds: . aspirin EC  81 mg Oral Daily  . atorvastatin  40 mg Oral Daily  . calcium-vitamin D  1 tablet Oral Daily  . docusate sodium  100 mg Oral BID  . heparin  5,000 Units Subcutaneous  Q8H  . ibuprofen  200 mg Oral TID  . insulin aspart  0-9 Units Subcutaneous TID WC  . isosorbide-hydrALAZINE  1 tablet Oral TID  . metoprolol tartrate  25 mg Oral BID  . omega-3 acid ethyl esters  2 g Oral Daily  . pantoprazole  40 mg Oral Q0600  . sodium chloride flush  3 mL Intravenous Q12H   Continuous Infusions: . sodium chloride 50 mL/hr at 03/27/20 1403  . 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 Continue IV antibiortics   LOS: 1 day   Time  spent including chart review, lab review, examination, discussion with patient : 30 min   [name]  MD  03/28/2020, 9:27 AM

## 2020-03-29 LAB — GLUCOSE, CAPILLARY
Glucose-Capillary: 118 mg/dL — ABNORMAL HIGH (ref 70–99)
Glucose-Capillary: 121 mg/dL — ABNORMAL HIGH (ref 70–99)
Glucose-Capillary: 95 mg/dL (ref 70–99)
Glucose-Capillary: 107 mg/dL — ABNORMAL HIGH (ref 70–99)

## 2020-03-29 MED ORDER — NICOTINE 21 MG/24HR TD PT24
21.0000 mg | MEDICATED_PATCH | Freq: Every day | TRANSDERMAL | Status: DC
  Administered 2020-03-29 – 2020-03-31 (×3): 21 mg via TRANSDERMAL
  Filled 2020-03-29 (×3): qty 1

## 2020-03-29 MED ORDER — FUROSEMIDE 10 MG/ML IJ SOLN
40.0000 mg | Freq: Once | INTRAMUSCULAR | Status: AC
  Administered 2020-03-29: 40 mg via INTRAVENOUS
  Filled 2020-03-29: qty 4

## 2020-03-29 MED ORDER — ACETAMINOPHEN 500 MG PO TABS
500.0000 mg | ORAL_TABLET | Freq: Four times a day (QID) | ORAL | Status: DC
  Administered 2020-03-29 – 2020-03-31 (×8): 500 mg via ORAL
  Filled 2020-03-29 (×7): qty 1

## 2020-03-29 NOTE — Plan of Care (Signed)
°  Problem: Coping: °Goal: Level of anxiety will decrease °Outcome: Progressing °  °

## 2020-03-29 NOTE — Progress Notes (Signed)
Ref: Orpah Cobb, MD   Subjective:  Right leg edema and pain continues. T max 99.2 degree F.  Objective:  Vital Signs in the last 24 hours: Temp:  [97.8 F (36.6 C)-99.2 F (37.3 C)] 98 F (36.7 C) (06/08 0904) Pulse Rate:  [63-69] 67 (06/08 0904) Resp:  [16-18] 18 (06/08 0904) BP: (141-154)/(85-96) 152/85 (06/08 0904) SpO2:  [99 %-100 %] 100 % (06/08 0904)  Physical Exam: BP Readings from Last 1 Encounters:  03/29/20 (!) 152/85     Wt Readings from Last 1 Encounters:  03/26/20 98 kg    Weight change:  Body mass index is 31.9 kg/m. HEENT: [place]/AT, Eyes-Brown, PERL, EOMI, Conjunctiva-Pink, Sclera-Non-icteric Neck: No JVD, No bruit, Trachea midline. Lungs:  Clear, Bilateral. Cardiac:  Regular rhythm, normal S1 and S2, no S3. II/VI systolic murmur. Abdomen:  Soft, non-tender. BS present. Extremities:  2 + right lower leg edema and tenderness present. No cyanosis. No clubbing. CNS: AxOx3, Cranial nerves grossly intact, moves all 4 extremities.  Skin: Warm and dry.   Intake/Output from previous day: 06/07 0701 - 06/08 0700 In: 2154.4 [P.O.:1200; I.V.:854.4; IV Piggyback:100] Out: 850 [Urine:850]    Lab Results: BMET    Component Value Date/Time   NA 137 03/28/2020 0619   NA 135 03/26/2020 1829   NA 140 12/12/2017 1043   K 4.1 03/28/2020 0619   K 3.9 03/26/2020 1829   K 4.1 12/12/2017 1043   CL 106 03/28/2020 0619   CL 104 03/26/2020 1829   CL 108 12/12/2017 1043   CO2 22 03/28/2020 0619   CO2 23 03/26/2020 1829   CO2 23 12/12/2017 1043   GLUCOSE 110 (H) 03/28/2020 0619   GLUCOSE 134 (H) 03/26/2020 1829   GLUCOSE 86 12/12/2017 1043   BUN 22 03/28/2020 0619   BUN 20 03/26/2020 1829   BUN 17 12/12/2017 1043   CREATININE 1.68 (H) 03/28/2020 0619   CREATININE 1.92 (H) 03/27/2020 1231   CREATININE 1.81 (H) 03/26/2020 1829   CALCIUM 8.6 (L) 03/28/2020 0619   CALCIUM 8.9 03/26/2020 1829   CALCIUM 9.1 12/12/2017 1043   GFRNONAA 42 (L) 03/28/2020 0619   GFRNONAA 36 (L) 03/27/2020 1231   GFRNONAA 39 (L) 03/26/2020 1829   GFRAA 49 (L) 03/28/2020 0619   GFRAA 42 (L) 03/27/2020 1231   GFRAA 45 (L) 03/26/2020 1829   CBC    Component Value Date/Time   WBC 5.7 03/28/2020 0619   RBC 3.86 (L) 03/28/2020 0619   HGB 10.8 (L) 03/28/2020 0619   HCT 33.7 (L) 03/28/2020 0619   PLT 146 (L) 03/28/2020 0619   MCV 87.3 03/28/2020 0619   MCH 28.0 03/28/2020 0619   MCHC 32.0 03/28/2020 0619   RDW 13.2 03/28/2020 0619   HEPATIC Function Panel No results for input(s): PROT in the last 8760 hours.  Invalid input(s):  ALBUMIN,  AST,  ALT,  ALKPHOS,  BILIDIR,  IBILI HEMOGLOBIN A1C No components found for: HGA1C,  MPG CARDIAC ENZYMES No results found for: CKTOTAL, CKMB, CKMBINDEX, TROPONINI BNP No results for input(s): PROBNP in the last 8760 hours. TSH No results for input(s): TSH in the last 8760 hours. CHOLESTEROL No results for input(s): CHOL in the last 8760 hours.  Scheduled Meds: . acetaminophen  500 mg Oral QID  . aspirin EC  81 mg Oral Daily  . atorvastatin  40 mg Oral Daily  . calcium-vitamin D  1 tablet Oral Daily  . docusate sodium  100 mg Oral BID  . furosemide  40  mg Intravenous Once  . heparin  5,000 Units Subcutaneous Q8H  . insulin aspart  0-9 Units Subcutaneous TID WC  . isosorbide-hydrALAZINE  1 tablet Oral TID  . metoprolol tartrate  25 mg Oral BID  . omega-3 acid ethyl esters  2 g Oral Daily  . pantoprazole  40 mg Oral Q0600  . sodium chloride flush  3 mL Intravenous Q12H   Continuous Infusions: . cefTRIAXone (ROCEPHIN)  IV 2 g (03/29/20 0241)   PRN Meds:.albuterol  Assessment/Plan: Cellulitis of right leg HTN Type 2 DM Hyperlipidemia CKD, II COPD CAD Tobacco use disorder Chronic venous insufficiency Severe degenerative joint disease  IV lasix once. CMET, CK and CBC with diff in AM DC ibuprofen Scheduled tylenol for pain control. Nicotine patch.    LOS: 2 days   Time spent including chart review,  lab review, examination, discussion with patient :  min   [name]  MD  03/29/2020, 9:59 AM

## 2020-03-29 NOTE — Plan of Care (Signed)
  Problem: Activity: Goal: Risk for activity intolerance will decrease Outcome: Progressing   Problem: Coping: Goal: Level of anxiety will decrease Outcome: Progressing   

## 2020-03-30 LAB — CBC WITH DIFFERENTIAL/PLATELET
Abs Immature Granulocytes: 0.03 10*3/uL (ref 0.00–0.07)
Basophils Absolute: 0 10*3/uL (ref 0.0–0.1)
Basophils Relative: 1 %
Eosinophils Absolute: 0.1 10*3/uL (ref 0.0–0.5)
Eosinophils Relative: 1 %
HCT: 33.9 % — ABNORMAL LOW (ref 39.0–52.0)
Hemoglobin: 10.8 g/dL — ABNORMAL LOW (ref 13.0–17.0)
Immature Granulocytes: 1 %
Lymphocytes Relative: 25 %
Lymphs Abs: 1.6 10*3/uL (ref 0.7–4.0)
MCH: 27.8 pg (ref 26.0–34.0)
MCHC: 31.9 g/dL (ref 30.0–36.0)
MCV: 87.4 fL (ref 80.0–100.0)
Monocytes Absolute: 0.9 10*3/uL (ref 0.1–1.0)
Monocytes Relative: 14 %
Neutro Abs: 3.8 10*3/uL (ref 1.7–7.7)
Neutrophils Relative %: 58 %
Platelets: 184 10*3/uL (ref 150–400)
RBC: 3.88 MIL/uL — ABNORMAL LOW (ref 4.22–5.81)
RDW: 13.1 % (ref 11.5–15.5)
WBC: 6.5 10*3/uL (ref 4.0–10.5)
nRBC: 0 % (ref 0.0–0.2)

## 2020-03-30 LAB — COMPREHENSIVE METABOLIC PANEL
ALT: 24 U/L (ref 0–44)
AST: 20 U/L (ref 15–41)
Albumin: 2.4 g/dL — ABNORMAL LOW (ref 3.5–5.0)
Alkaline Phosphatase: 51 U/L (ref 38–126)
Anion gap: 9 (ref 5–15)
BUN: 14 mg/dL (ref 8–23)
CO2: 24 mmol/L (ref 22–32)
Calcium: 8.8 mg/dL — ABNORMAL LOW (ref 8.9–10.3)
Chloride: 104 mmol/L (ref 98–111)
Creatinine, Ser: 1.5 mg/dL — ABNORMAL HIGH (ref 0.61–1.24)
GFR calc Af Amer: 56 mL/min — ABNORMAL LOW (ref >60)
GFR calc non Af Amer: 49 mL/min — ABNORMAL LOW (ref >60)
Glucose, Bld: 127 mg/dL — ABNORMAL HIGH (ref 70–99)
Potassium: 3.8 mmol/L (ref 3.5–5.1)
Sodium: 137 mmol/L (ref 135–145)
Total Bilirubin: 0.7 mg/dL (ref 0.3–1.2)
Total Protein: 5.8 g/dL — ABNORMAL LOW (ref 6.5–8.1)

## 2020-03-30 LAB — GLUCOSE, CAPILLARY
Glucose-Capillary: 100 mg/dL — ABNORMAL HIGH (ref 70–99)
Glucose-Capillary: 119 mg/dL — ABNORMAL HIGH (ref 70–99)
Glucose-Capillary: 101 mg/dL — ABNORMAL HIGH (ref 70–99)
Glucose-Capillary: 126 mg/dL — ABNORMAL HIGH (ref 70–99)

## 2020-03-30 LAB — CK: Total CK: 107 U/L (ref 49–397)

## 2020-03-30 NOTE — Plan of Care (Signed)

## 2020-03-30 NOTE — Progress Notes (Signed)
Ref: [name], MD   Subjective:  Minimal decrease in right leg edema and pain. T max 99.1 degree F.  Creatinine is 1.50 mg.  Objective:  Vital Signs in the last 24 hours: Temp:  [98.4 F (36.9 C)-99.1 F (37.3 C)] 98.9 F (37.2 C) (06/09 0933) Pulse Rate:  [57-106] 57 (06/09 0933) Resp:  [16-20] 20 (06/09 0933) BP: (133-160)/(82-105) 160/83 (06/09 0933) SpO2:  [95 %-100 %] 100 % (06/09 0933)  Physical Exam: BP Readings from Last 1 Encounters:  03/30/20 (!) 160/83     Wt Readings from Last 1 Encounters:  03/26/20 98 kg    Weight change:  Body mass index is 31.9 kg/m. HEENT: [place]/AT, Eyes-Brown, PERL, EOMI, Conjunctiva-Pink, Sclera-Non-icteric Neck: No JVD, No bruit, Trachea midline. Lungs:  Clear, Bilateral. Cardiac:  Regular rhythm, normal S1 and S2, no S3. II/VI systolic murmur. Abdomen:  Soft, non-tender. BS present. Extremities:  1 + right lower leg and 2 + right foot edema present. No cyanosis. No clubbing. CNS: AxOx3, Cranial nerves grossly intact, moves all 4 extremities.  Skin: Warm and dry.   Intake/Output from previous day: 06/08 0701 - 06/09 0700 In: 600 [P.O.:600] Out: 2525 [Urine:2525]    Lab Results: BMET    Component Value Date/Time   NA 137 03/30/2020 0342   NA 137 03/28/2020 0619   NA 135 03/26/2020 1829   K 3.8 03/30/2020 0342   K 4.1 03/28/2020 0619   K 3.9 03/26/2020 1829   CL 104 03/30/2020 0342   CL 106 03/28/2020 0619   CL 104 03/26/2020 1829   CO2 24 03/30/2020 0342   CO2 22 03/28/2020 0619   CO2 23 03/26/2020 1829   GLUCOSE 127 (H) 03/30/2020 0342   GLUCOSE 110 (H) 03/28/2020 0619   GLUCOSE 134 (H) 03/26/2020 1829   BUN 14 03/30/2020 0342   BUN 22 03/28/2020 0619   BUN 20 03/26/2020 1829   CREATININE 1.50 (H) 03/30/2020 0342   CREATININE 1.68 (H) 03/28/2020 0619   CREATININE 1.92 (H) 03/27/2020 1231   CALCIUM 8.8 (L) 03/30/2020 0342   CALCIUM 8.6 (L) 03/28/2020 0619   CALCIUM 8.9 03/26/2020 1829   GFRNONAA 49 (L)  03/30/2020 0342   GFRNONAA 42 (L) 03/28/2020 0619   GFRNONAA 36 (L) 03/27/2020 1231   GFRAA 56 (L) 03/30/2020 0342   GFRAA 49 (L) 03/28/2020 0619   GFRAA 42 (L) 03/27/2020 1231   CBC    Component Value Date/Time   WBC 6.5 03/30/2020 0342   RBC 3.88 (L) 03/30/2020 0342   HGB 10.8 (L) 03/30/2020 0342   HCT 33.9 (L) 03/30/2020 0342   PLT 184 03/30/2020 0342   MCV 87.4 03/30/2020 0342   MCH 27.8 03/30/2020 0342   MCHC 31.9 03/30/2020 0342   RDW 13.1 03/30/2020 0342   LYMPHSABS 1.6 03/30/2020 0342   MONOABS 0.9 03/30/2020 0342   EOSABS 0.1 03/30/2020 0342   BASOSABS 0.0 03/30/2020 0342   HEPATIC Function Panel Recent Labs    03/30/20 0342  PROT 5.8*   HEMOGLOBIN A1C No components found for: HGA1C,  MPG CARDIAC ENZYMES Lab Results  Component Value Date   CKTOTAL 107 03/30/2020   BNP No results for input(s): PROBNP in the last 8760 hours. TSH No results for input(s): TSH in the last 8760 hours. CHOLESTEROL No results for input(s): CHOL in the last 8760 hours.  Scheduled Meds: . acetaminophen  500 mg Oral QID  . aspirin EC  81 mg Oral Daily  . atorvastatin  40 mg  Oral Daily  . calcium-vitamin D  1 tablet Oral Daily  . docusate sodium  100 mg Oral BID  . heparin  5,000 Units Subcutaneous Q8H  . insulin aspart  0-9 Units Subcutaneous TID WC  . isosorbide-hydrALAZINE  1 tablet Oral TID  . metoprolol tartrate  25 mg Oral BID  . nicotine  21 mg Transdermal Daily  . omega-3 acid ethyl esters  2 g Oral Daily  . pantoprazole  40 mg Oral Q0600  . sodium chloride flush  3 mL Intravenous Q12H   Continuous Infusions: . cefTRIAXone (ROCEPHIN)  IV 2 g (03/30/20 0242)   PRN Meds:.albuterol  Assessment/Plan: Cellulitis of right leg HTN Type 2 DM Hyperlipidemia CKD, II CAD COPD Tobacco use disorder Chronic venous insufficiency Severe degenerative joint disease  Continue IV antibiotics.   LOS: 3 days   Time spent including chart review, lab review, examination,  discussion with patient and pahrmacist : 30 min   Orpah Cobb  MD  03/30/2020, 10:05 AM

## 2020-03-31 LAB — GLUCOSE, CAPILLARY
Glucose-Capillary: 86 mg/dL (ref 70–99)
Glucose-Capillary: 81 mg/dL (ref 70–99)

## 2020-03-31 MED ORDER — METOPROLOL TARTRATE 25 MG PO TABS: 25.0000 mg | ORAL_TABLET | Freq: Two times a day (BID) | ORAL | 3 refills | Status: DC

## 2020-03-31 MED ORDER — AMLODIPINE BESYLATE 5 MG PO TABS
5.0000 mg | ORAL_TABLET | Freq: Every day | ORAL | Status: DC
  Administered 2020-03-31: 5 mg via ORAL
  Filled 2020-03-31: qty 1

## 2020-03-31 MED ORDER — SODIUM CHLORIDE 0.9 % IV SOLN
INTRAVENOUS | Status: DC | PRN
  Administered 2020-03-31: 250 mL via INTRAVENOUS

## 2020-03-31 MED ORDER — ASPIRIN 81 MG PO TBEC: 81.0000 mg | DELAYED_RELEASE_TABLET | Freq: Every day | ORAL | Status: DC

## 2020-03-31 MED ORDER — HYDRALAZINE HCL 50 MG PO TABS
50.0000 mg | ORAL_TABLET | Freq: Two times a day (BID) | ORAL | Status: DC
  Administered 2020-03-31: 50 mg via ORAL
  Filled 2020-03-31: qty 1

## 2020-03-31 MED ORDER — DOXYCYCLINE HYCLATE 100 MG PO TABS: 100.0000 mg | ORAL_TABLET | Freq: Two times a day (BID) | ORAL | 0 refills | Status: DC

## 2020-03-31 MED ORDER — NICOTINE 21 MG/24HR TD PT24: 21.0000 mg | MEDICATED_PATCH | Freq: Every day | TRANSDERMAL | 0 refills | Status: DC

## 2020-03-31 MED ORDER — AMLODIPINE BESYLATE 5 MG PO TABS: 5.0000 mg | ORAL_TABLET | Freq: Every day | ORAL | Status: DC

## 2020-03-31 MED ORDER — ISOSORBIDE MONONITRATE ER 60 MG PO TB24
60.0000 mg | ORAL_TABLET | Freq: Every day | ORAL | Status: DC
  Administered 2020-03-31: 60 mg via ORAL
  Filled 2020-03-31: qty 1

## 2020-03-31 MED ORDER — DOXYCYCLINE HYCLATE 100 MG PO TABS
100.0000 mg | ORAL_TABLET | Freq: Two times a day (BID) | ORAL | Status: DC
  Administered 2020-03-31: 100 mg via ORAL
  Filled 2020-03-31: qty 1

## 2020-03-31 NOTE — Plan of Care (Signed)

## 2020-03-31 NOTE — Discharge Summary (Signed)
Physician Discharge Summary  Patient ID: Phillip Kennedy MRN: [id number] DOB/AGE: [birth year] 64 y.o.  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 COPD Tobacco abuse Chronic venous insufficiency Severe degenerative joint disease  Discharge Diagnoses:  Principle Problem: * Cellulitis of right leg * Active Problems: Essential Hypertension Type 2 DM Hyperlipidemia CKD, stage II CAD COPD Tobacco use disorder Chronic venous insufficiency Severe degenerative joint disease Obesity  Discharged Condition: fair  Hospital Course: 64 years old black male with PMH of hypertension, type 2 DM, diet controlled, CKD, III, COPD, CAD, Tobacco use disorder, left knee arthroplasty and right knee tendon repair had increasing pain and swelling of right lower leg after something bit him when he was working in the yard. CT of the right leg showed cellulitis as diffuse swelling and no evidence of DVT.  He was treated with IV Rocephin. After 72 hours he started feeling better. The right leg edema and pain were decreased by 75 %. He was started on oral doxycycline and discharged home in stable condition with f/u by me in 1 week.  Consults: cardiology  Significant Diagnostic Studies: labs: Near normal CBC with mild thrombocytopenia. Near normal BMET except creatinine of 1.81 improving to 1.5 mg. Albumin was low at 2.4 gm.  CT lower leg and foot: Marked subcutaneous edema, suggestive of cellulitis. No filling defect in calf vein. Advanced osteoarthritis of right knee.  EKG: SR, frequent VPCs, old Anteroseptal wall MI.  Treatments: antibiotics: ceftriaxone followed by doxycycline.  Discharge Exam: Blood pressure (!) 156/118, pulse (!) 51, temperature 98.1 F (36.7 C), temperature source Oral, resp. rate 18, height 5\' 9"  (1.753 m),  weight 98 kg, SpO2 100 %. General appearance: alert, cooperative and appears stated age. Head: Normocephalic, atraumatic. Eyes: Brown eyes, pink conjunctiva, corneas clear. PERRL, EOM's intact.  Neck: No adenopathy, no carotid bruit, no JVD, supple, symmetrical, trachea midline and thyroid not enlarged. Resp: Clear to auscultation bilaterally. Cardio: Regular rate and rhythm, S1, S2 normal, II/VI systolic murmur, no click, rub or gallop. GI: Soft, non-tender; bowel sounds normal; no organomegaly. Extremities: Trace right lower leg edema, no cyanosis or clubbing. Skin: Warm and dry.  Neurologic: Alert and oriented X 3, normal strength and tone. Normal coordination and gait.  Disposition: Home, self care   Allergies as of 03/31/2020   No Known Allergies     Medication List    STOP taking these medications   methocarbamol 500 MG tablet Commonly known as: Robaxin   ondansetron 4 MG tablet Commonly known as: Zofran     TAKE these medications   acetaminophen 500 MG tablet Commonly known as: TYLENOL Take 1,000 mg by mouth 2 (two) times daily as needed for mild pain.   albuterol 108 (90 Base) MCG/ACT inhaler Commonly known as: VENTOLIN HFA Inhale 1-2 puffs into the lungs every 6 (six) hours as needed for wheezing or shortness of breath.   amLODipine 5 MG tablet Commonly known as: NORVASC Take 1 tablet (5 mg total) by mouth daily. What changed: when to take this   aspirin 81 MG EC tablet Take 1 tablet (81 mg total) by mouth daily.   atorvastatin 40 MG tablet Commonly known as: LIPITOR Take 1 tablet by mouth daily.   CALCIUM + D3 PO Take 1 tablet by mouth daily.   cloNIDine 0.2 MG tablet Commonly known as: CATAPRES Take 0.2 mg by mouth at  bedtime.   docusate sodium 100 MG capsule Commonly known as: Colace Take 1 capsule (100 mg total) by mouth 2 (two) times daily. To prevent constipation while taking pain medication.   doxycycline 100 MG tablet Commonly known as:  VIBRA-TABS Take 1 tablet (100 mg total) by mouth every 12 (twelve) hours.   FISH OIL PO Take 1 capsule by mouth daily.   hydrALAZINE 25 MG tablet Commonly known as: APRESOLINE Take 25 mg by mouth 3 (three) times daily.   metoprolol tartrate 25 MG tablet Commonly known as: LOPRESSOR Take 1 tablet (25 mg total) by mouth 2 (two) times daily. What changed:   medication strength  how much to take   nicotine 21 mg/24hr patch Commonly known as: NICODERM CQ - dosed in mg/24 hours Place 1 patch (21 mg total) onto the skin daily.       Follow-up Information    [name], MD. Schedule an appointment as soon as possible for a visit in 1 week(s).   Specialty: Cardiology Contact information: [name] [id number] [phone]               Time spent: Review of old chart, current chart, lab, x-ray, cardiac tests and discussion with patient over 60 minutes.  Signed: [name] 03/31/2020, 8:25 AM

## 2020-03-31 NOTE — Plan of Care (Signed)
  Problem: Education: Goal: Knowledge of General Education information will improve Description Including pain rating scale, medication(s)/side effects and non-pharmacologic comfort measures Outcome: Progressing   

## 2020-06-23 ENCOUNTER — Inpatient Hospital Stay (HOSPITAL_COMMUNITY)
Admission: EM | Admit: 2020-06-23 | Discharge: 2020-06-29 | DRG: 280 | Disposition: A | Discharge status: Home / Self Care | Payer: Medicaid Other | Attending: Internal Medicine | Admitting: Internal Medicine

## 2020-06-23 ENCOUNTER — Emergency Department (HOSPITAL_COMMUNITY): Payer: Medicaid Other

## 2020-06-23 ENCOUNTER — Inpatient Hospital Stay (HOSPITAL_COMMUNITY): Payer: Medicaid Other

## 2020-06-23 ENCOUNTER — Other Ambulatory Visit: Payer: Self-pay

## 2020-06-23 DIAGNOSIS — E1122 Type 2 diabetes mellitus with diabetic chronic kidney disease: Secondary | ICD-10-CM | POA: Diagnosis present

## 2020-06-23 DIAGNOSIS — I5043 Acute on chronic combined systolic (congestive) and diastolic (congestive) heart failure: Secondary | ICD-10-CM | POA: Diagnosis present

## 2020-06-23 DIAGNOSIS — J150 Pneumonia due to Klebsiella pneumoniae: Secondary | ICD-10-CM | POA: Diagnosis present

## 2020-06-23 DIAGNOSIS — F1410 Cocaine abuse, uncomplicated: Secondary | ICD-10-CM | POA: Diagnosis present

## 2020-06-23 DIAGNOSIS — J440 Chronic obstructive pulmonary disease with acute lower respiratory infection: Secondary | ICD-10-CM | POA: Diagnosis present

## 2020-06-23 DIAGNOSIS — J9602 Acute respiratory failure with hypercapnia: Secondary | ICD-10-CM | POA: Diagnosis present

## 2020-06-23 DIAGNOSIS — I214 Non-ST elevation (NSTEMI) myocardial infarction: Secondary | ICD-10-CM | POA: Diagnosis present

## 2020-06-23 DIAGNOSIS — I161 Hypertensive emergency: Secondary | ICD-10-CM | POA: Diagnosis present

## 2020-06-23 DIAGNOSIS — Z9111 Patient's noncompliance with dietary regimen: Secondary | ICD-10-CM

## 2020-06-23 DIAGNOSIS — Z20822 Contact with and (suspected) exposure to covid-19: Secondary | ICD-10-CM | POA: Diagnosis present

## 2020-06-23 DIAGNOSIS — I2510 Atherosclerotic heart disease of native coronary artery without angina pectoris: Secondary | ICD-10-CM | POA: Diagnosis present

## 2020-06-23 DIAGNOSIS — Z6833 Body mass index (BMI) 33.0-33.9, adult: Secondary | ICD-10-CM

## 2020-06-23 DIAGNOSIS — N179 Acute kidney failure, unspecified: Secondary | ICD-10-CM | POA: Diagnosis present

## 2020-06-23 DIAGNOSIS — N281 Cyst of kidney, acquired: Secondary | ICD-10-CM | POA: Diagnosis present

## 2020-06-23 DIAGNOSIS — E669 Obesity, unspecified: Secondary | ICD-10-CM | POA: Diagnosis present

## 2020-06-23 DIAGNOSIS — F17210 Nicotine dependence, cigarettes, uncomplicated: Secondary | ICD-10-CM | POA: Diagnosis present

## 2020-06-23 DIAGNOSIS — Z9289 Personal history of other medical treatment: Secondary | ICD-10-CM

## 2020-06-23 DIAGNOSIS — Z79899 Other long term (current) drug therapy: Secondary | ICD-10-CM

## 2020-06-23 DIAGNOSIS — Z1611 Resistance to penicillins: Secondary | ICD-10-CM | POA: Diagnosis present

## 2020-06-23 DIAGNOSIS — J9601 Acute respiratory failure with hypoxia: Secondary | ICD-10-CM | POA: Diagnosis present

## 2020-06-23 DIAGNOSIS — N1832 Chronic kidney disease, stage 3b: Secondary | ICD-10-CM | POA: Diagnosis present

## 2020-06-23 DIAGNOSIS — R451 Restlessness and agitation: Secondary | ICD-10-CM | POA: Diagnosis present

## 2020-06-23 DIAGNOSIS — E785 Hyperlipidemia, unspecified: Secondary | ICD-10-CM | POA: Diagnosis present

## 2020-06-23 DIAGNOSIS — I130 Hypertensive heart and chronic kidney disease with heart failure and stage 1 through stage 4 chronic kidney disease, or unspecified chronic kidney disease: Secondary | ICD-10-CM | POA: Diagnosis present

## 2020-06-23 DIAGNOSIS — K5900 Constipation, unspecified: Secondary | ICD-10-CM | POA: Diagnosis present

## 2020-06-23 DIAGNOSIS — J810 Acute pulmonary edema: Secondary | ICD-10-CM | POA: Diagnosis present

## 2020-06-23 LAB — COMPREHENSIVE METABOLIC PANEL
ALT: 59 U/L — ABNORMAL HIGH (ref 0–44)
AST: 59 U/L — ABNORMAL HIGH (ref 15–41)
Albumin: 3.6 g/dL (ref 3.5–5.0)
Alkaline Phosphatase: 156 U/L — ABNORMAL HIGH (ref 38–126)
Anion gap: 10 (ref 5–15)
BUN: 19 mg/dL (ref 8–23)
CO2: 24 mmol/L (ref 22–32)
Calcium: 9.3 mg/dL (ref 8.9–10.3)
Chloride: 111 mmol/L (ref 98–111)
Creatinine, Ser: 2.25 mg/dL — ABNORMAL HIGH (ref 0.61–1.24)
GFR calc Af Amer: 34 mL/min — ABNORMAL LOW (ref >60)
GFR calc non Af Amer: 30 mL/min — ABNORMAL LOW (ref >60)
Glucose, Bld: 215 mg/dL — ABNORMAL HIGH (ref 70–99)
Potassium: 3.6 mmol/L (ref 3.5–5.1)
Sodium: 145 mmol/L (ref 135–145)
Total Bilirubin: 0.6 mg/dL (ref 0.3–1.2)
Total Protein: 7.4 g/dL (ref 6.5–8.1)

## 2020-06-23 LAB — CBC WITH DIFFERENTIAL/PLATELET
Abs Immature Granulocytes: 0.02 10*3/uL (ref 0.00–0.07)
Abs Immature Granulocytes: 0.04 10*3/uL (ref 0.00–0.07)
Basophils Absolute: 0 10*3/uL (ref 0.0–0.1)
Basophils Absolute: 0 10*3/uL (ref 0.0–0.1)
Basophils Relative: 1 %
Basophils Relative: 0 %
Eosinophils Absolute: 0.2 10*3/uL (ref 0.0–0.5)
Eosinophils Absolute: 0 10*3/uL (ref 0.0–0.5)
Eosinophils Relative: 4 %
Eosinophils Relative: 0 %
HCT: 47.8 % (ref 39.0–52.0)
HCT: 45.3 % (ref 39.0–52.0)
Hemoglobin: 14.2 g/dL (ref 13.0–17.0)
Hemoglobin: 13.5 g/dL (ref 13.0–17.0)
Immature Granulocytes: 0 %
Immature Granulocytes: 1 %
Lymphocytes Relative: 49 %
Lymphocytes Relative: 9 %
Lymphs Abs: 2.7 10*3/uL (ref 0.7–4.0)
Lymphs Abs: 0.7 10*3/uL (ref 0.7–4.0)
MCH: 27 pg (ref 26.0–34.0)
MCH: 27.4 pg (ref 26.0–34.0)
MCHC: 29.7 g/dL — ABNORMAL LOW (ref 30.0–36.0)
MCHC: 29.8 g/dL — ABNORMAL LOW (ref 30.0–36.0)
MCV: 91 fL (ref 80.0–100.0)
MCV: 91.9 fL (ref 80.0–100.0)
Monocytes Absolute: 0.2 10*3/uL (ref 0.1–1.0)
Monocytes Absolute: 0.3 10*3/uL (ref 0.1–1.0)
Monocytes Relative: 4 %
Monocytes Relative: 4 %
Neutro Abs: 2.3 10*3/uL (ref 1.7–7.7)
Neutro Abs: 7.2 10*3/uL (ref 1.7–7.7)
Neutrophils Relative %: 42 %
Neutrophils Relative %: 86 %
Platelets: 221 10*3/uL (ref 150–400)
Platelets: 170 10*3/uL (ref 150–400)
RBC: 5.25 MIL/uL (ref 4.22–5.81)
RBC: 4.93 MIL/uL (ref 4.22–5.81)
RDW: 13.1 % (ref 11.5–15.5)
RDW: 13.2 % (ref 11.5–15.5)
WBC: 5.5 10*3/uL (ref 4.0–10.5)
WBC: 8.4 10*3/uL (ref 4.0–10.5)
nRBC: 0 % (ref 0.0–0.2)
nRBC: 0 % (ref 0.0–0.2)

## 2020-06-23 LAB — BASIC METABOLIC PANEL
Anion gap: 13 (ref 5–15)
BUN: 20 mg/dL (ref 8–23)
CO2: 21 mmol/L — ABNORMAL LOW (ref 22–32)
Calcium: 9.4 mg/dL (ref 8.9–10.3)
Chloride: 109 mmol/L (ref 98–111)
Creatinine, Ser: 2.29 mg/dL — ABNORMAL HIGH (ref 0.61–1.24)
GFR calc Af Amer: 34 mL/min — ABNORMAL LOW (ref >60)
GFR calc non Af Amer: 29 mL/min — ABNORMAL LOW (ref >60)
Glucose, Bld: 124 mg/dL — ABNORMAL HIGH (ref 70–99)
Potassium: 4.4 mmol/L (ref 3.5–5.1)
Sodium: 143 mmol/L (ref 135–145)

## 2020-06-23 LAB — POCT I-STAT 7, (LYTES, BLD GAS, ICA,H+H)
Acid-base deficit: 4 mmol/L — ABNORMAL HIGH (ref 0.0–2.0)
Bicarbonate: 20.9 mmol/L (ref 20.0–28.0)
Calcium, Ion: 1.25 mmol/L (ref 1.15–1.40)
HCT: 35 % — ABNORMAL LOW (ref 39.0–52.0)
Hemoglobin: 11.9 g/dL — ABNORMAL LOW (ref 13.0–17.0)
O2 Saturation: 90 %
Patient temperature: 99.5
Potassium: 4.4 mmol/L (ref 3.5–5.1)
Sodium: 142 mmol/L (ref 135–145)
TCO2: 22 mmol/L (ref 22–32)
pCO2 arterial: 39.4 mmHg (ref 32.0–48.0)
pH, Arterial: 7.334 — ABNORMAL LOW (ref 7.350–7.450)
pO2, Arterial: 63 mmHg — ABNORMAL LOW (ref 83.0–108.0)

## 2020-06-23 LAB — MAGNESIUM
Magnesium: 2 mg/dL (ref 1.7–2.4)
Magnesium: 2 mg/dL (ref 1.7–2.4)

## 2020-06-23 LAB — RAPID URINE DRUG SCREEN, HOSP PERFORMED
Amphetamines: NOT DETECTED
Barbiturates: NOT DETECTED
Benzodiazepines: POSITIVE — AB
Cocaine: POSITIVE — AB
Opiates: NOT DETECTED
Tetrahydrocannabinol: NOT DETECTED

## 2020-06-23 LAB — URINALYSIS, ROUTINE W REFLEX MICROSCOPIC
Bilirubin Urine: NEGATIVE
Glucose, UA: NEGATIVE mg/dL
Ketones, ur: NEGATIVE mg/dL
Leukocytes,Ua: NEGATIVE
Nitrite: NEGATIVE
Protein, ur: 100 mg/dL — AB
Specific Gravity, Urine: 1.01 (ref 1.005–1.030)
pH: 5 (ref 5.0–8.0)

## 2020-06-23 LAB — I-STAT ARTERIAL BLOOD GAS, ED
Acid-base deficit: 7 mmol/L — ABNORMAL HIGH (ref 0.0–2.0)
Bicarbonate: 24.8 mmol/L (ref 20.0–28.0)
Calcium, Ion: 1.3 mmol/L (ref 1.15–1.40)
HCT: 43 % (ref 39.0–52.0)
Hemoglobin: 14.6 g/dL (ref 13.0–17.0)
O2 Saturation: 96 %
Patient temperature: 98.6
Potassium: 4.2 mmol/L (ref 3.5–5.1)
Sodium: 143 mmol/L (ref 135–145)
TCO2: 27 mmol/L (ref 22–32)
pCO2 arterial: 79.5 mmHg (ref 32.0–48.0)
pH, Arterial: 7.101 — CL (ref 7.350–7.450)
pO2, Arterial: 119 mmHg — ABNORMAL HIGH (ref 83.0–108.0)

## 2020-06-23 LAB — HEMOGLOBIN A1C
Hgb A1c MFr Bld: 6.7 % — ABNORMAL HIGH (ref 4.8–5.6)
Mean Plasma Glucose: 145.59 mg/dL

## 2020-06-23 LAB — TRIGLYCERIDES: Triglycerides: 72 mg/dL (ref <150)

## 2020-06-23 LAB — GLUCOSE, CAPILLARY
Glucose-Capillary: 174 mg/dL — ABNORMAL HIGH (ref 70–99)
Glucose-Capillary: 175 mg/dL — ABNORMAL HIGH (ref 70–99)

## 2020-06-23 LAB — TROPONIN I (HIGH SENSITIVITY)
Troponin I (High Sensitivity): 56 ng/L — ABNORMAL HIGH (ref <18)
Troponin I (High Sensitivity): 178 ng/L (ref <18)
Troponin I (High Sensitivity): 1305 ng/L (ref <18)
Troponin I (High Sensitivity): 2971 ng/L (ref <18)

## 2020-06-23 LAB — PHOSPHORUS
Phosphorus: 3.5 mg/dL (ref 2.5–4.6)
Phosphorus: 4.6 mg/dL (ref 2.5–4.6)

## 2020-06-23 LAB — HIV ANTIBODY (ROUTINE TESTING W REFLEX): HIV Screen 4th Generation wRfx: NONREACTIVE

## 2020-06-23 LAB — SARS CORONAVIRUS 2 BY RT PCR (HOSPITAL ORDER, PERFORMED IN ~~LOC~~ HOSPITAL LAB): SARS Coronavirus 2: NEGATIVE

## 2020-06-23 LAB — CBG MONITORING, ED
Glucose-Capillary: 126 mg/dL — ABNORMAL HIGH (ref 70–99)
Glucose-Capillary: 101 mg/dL — ABNORMAL HIGH (ref 70–99)

## 2020-06-23 LAB — BRAIN NATRIURETIC PEPTIDE: B Natriuretic Peptide: 829.3 pg/mL — ABNORMAL HIGH (ref 0.0–100.0)

## 2020-06-23 LAB — HEPARIN LEVEL (UNFRACTIONATED): Heparin Unfractionated: <0.1 IU/mL — ABNORMAL LOW (ref 0.30–0.70)

## 2020-06-23 MED ORDER — ROCURONIUM BROMIDE 50 MG/5ML IV SOLN
INTRAVENOUS | Status: AC | PRN
  Administered 2020-06-23: 80 mg via INTRAVENOUS

## 2020-06-23 MED ORDER — PROPOFOL 1000 MG/100ML IV EMUL
0.0000 ug/kg/min | INTRAVENOUS | Status: DC
  Administered 2020-06-23 (×3): 45 ug/kg/min via INTRAVENOUS
  Administered 2020-06-23: 50 ug/kg/min via INTRAVENOUS
  Administered 2020-06-23 – 2020-06-24 (×2): 35 ug/kg/min via INTRAVENOUS
  Administered 2020-06-24: 20 ug/kg/min via INTRAVENOUS
  Administered 2020-06-24: 35 ug/kg/min via INTRAVENOUS
  Filled 2020-06-23 (×8): qty 100

## 2020-06-23 MED ORDER — IPRATROPIUM-ALBUTEROL 0.5-2.5 (3) MG/3ML IN SOLN
3.0000 mL | Freq: Four times a day (QID) | RESPIRATORY_TRACT | Status: DC
  Administered 2020-06-23 – 2020-06-24 (×8): 3 mL via RESPIRATORY_TRACT
  Filled 2020-06-23 (×8): qty 3

## 2020-06-23 MED ORDER — POLYETHYLENE GLYCOL 3350 17 G PO PACK
17.0000 g | PACK | Freq: Every day | ORAL | Status: DC | PRN
  Administered 2020-06-26: 17 g via ORAL

## 2020-06-23 MED ORDER — DOCUSATE SODIUM 100 MG PO CAPS
100.0000 mg | ORAL_CAPSULE | Freq: Two times a day (BID) | ORAL | Status: DC | PRN
  Administered 2020-06-24: 100 mg via ORAL
  Filled 2020-06-23: qty 1

## 2020-06-23 MED ORDER — PROPOFOL 10 MG/ML IV BOLUS
INTRAVENOUS | Status: AC
  Filled 2020-06-23: qty 20

## 2020-06-23 MED ORDER — ORAL CARE MOUTH RINSE
15.0000 mL | OROMUCOSAL | Status: DC
  Administered 2020-06-23 – 2020-06-24 (×9): 15 mL via OROMUCOSAL

## 2020-06-23 MED ORDER — FENTANYL BOLUS VIA INFUSION
50.0000 ug | INTRAVENOUS | Status: DC | PRN
  Filled 2020-06-23: qty 50

## 2020-06-23 MED ORDER — PANTOPRAZOLE SODIUM 40 MG IV SOLR
40.0000 mg | Freq: Every day | INTRAVENOUS | Status: DC
  Administered 2020-06-23 – 2020-06-25 (×4): 40 mg via INTRAVENOUS
  Filled 2020-06-23 (×4): qty 40

## 2020-06-23 MED ORDER — POLYETHYLENE GLYCOL 3350 17 G PO PACK: 17.0000 g | PACK | Freq: Every day | ORAL | Status: DC

## 2020-06-23 MED ORDER — METHYLPREDNISOLONE SODIUM SUCC 40 MG IJ SOLR
40.0000 mg | Freq: Three times a day (TID) | INTRAMUSCULAR | Status: DC
  Administered 2020-06-23 – 2020-06-24 (×3): 40 mg via INTRAVENOUS
  Filled 2020-06-23 (×4): qty 1

## 2020-06-23 MED ORDER — PROPOFOL 1000 MG/100ML IV EMUL
INTRAVENOUS | Status: AC
  Administered 2020-06-23: 15 ug/kg/min via INTRAVENOUS
  Filled 2020-06-23: qty 100

## 2020-06-23 MED ORDER — PROSOURCE TF PO LIQD
45.0000 mL | Freq: Two times a day (BID) | ORAL | Status: DC
  Administered 2020-06-23 – 2020-06-25 (×4): 45 mL
  Filled 2020-06-23 (×5): qty 45

## 2020-06-23 MED ORDER — PROPOFOL 1000 MG/100ML IV EMUL
5.0000 ug/kg/min | INTRAVENOUS | Status: DC
  Administered 2020-06-23: 15 ug/kg/min via INTRAVENOUS

## 2020-06-23 MED ORDER — HEPARIN (PORCINE) 25000 UT/250ML-% IV SOLN
1600.0000 [IU]/h | INTRAVENOUS | Status: DC
  Administered 2020-06-23: 1650 [IU]/h via INTRAVENOUS
  Administered 2020-06-23: 1300 [IU]/h via INTRAVENOUS
  Administered 2020-06-24: 1550 [IU]/h via INTRAVENOUS
  Administered 2020-06-25: 1450 [IU]/h via INTRAVENOUS
  Administered 2020-06-26 – 2020-06-27 (×2): 1600 [IU]/h via INTRAVENOUS
  Filled 2020-06-23 (×8): qty 250

## 2020-06-23 MED ORDER — INSULIN ASPART 100 UNIT/ML ~~LOC~~ SOLN
0.0000 [IU] | SUBCUTANEOUS | Status: DC
  Administered 2020-06-23 (×2): 2 [IU] via SUBCUTANEOUS
  Administered 2020-06-23 – 2020-06-26 (×6): 1 [IU] via SUBCUTANEOUS
  Administered 2020-06-27: 3 [IU] via SUBCUTANEOUS
  Administered 2020-06-28: 1 [IU] via SUBCUTANEOUS

## 2020-06-23 MED ORDER — CHLORHEXIDINE GLUCONATE 0.12% ORAL RINSE (MEDLINE KIT)
15.0000 mL | Freq: Two times a day (BID) | OROMUCOSAL | Status: DC
  Administered 2020-06-23 – 2020-06-27 (×7): 15 mL via OROMUCOSAL

## 2020-06-23 MED ORDER — MIDAZOLAM HCL 2 MG/2ML IJ SOLN
2.0000 mg | INTRAMUSCULAR | Status: DC | PRN
  Administered 2020-06-23: 2 mg via INTRAVENOUS
  Filled 2020-06-23: qty 2

## 2020-06-23 MED ORDER — HEPARIN BOLUS VIA INFUSION
4000.0000 [IU] | Freq: Once | INTRAVENOUS | Status: AC
  Administered 2020-06-23: 4000 [IU] via INTRAVENOUS
  Filled 2020-06-23: qty 4000

## 2020-06-23 MED ORDER — NITROGLYCERIN IN D5W 200-5 MCG/ML-% IV SOLN
0.0000 ug/min | INTRAVENOUS | Status: DC
  Administered 2020-06-23: 5 ug/min via INTRAVENOUS
  Filled 2020-06-23: qty 250

## 2020-06-23 MED ORDER — ASPIRIN 325 MG PO TABS: 325.0000 mg | ORAL_TABLET | Freq: Every day | ORAL | Status: DC

## 2020-06-23 MED ORDER — CHLORHEXIDINE GLUCONATE CLOTH 2 % EX PADS
6.0000 | MEDICATED_PAD | Freq: Every day | CUTANEOUS | Status: DC
  Administered 2020-06-23 – 2020-06-29 (×5): 6 via TOPICAL

## 2020-06-23 MED ORDER — POLYETHYLENE GLYCOL 3350 17 G PO PACK
17.0000 g | PACK | Freq: Every day | ORAL | Status: DC
  Administered 2020-06-24 – 2020-06-25 (×2): 17 g
  Filled 2020-06-23 (×2): qty 1

## 2020-06-23 MED ORDER — FENTANYL CITRATE (PF) 100 MCG/2ML IJ SOLN
50.0000 ug | Freq: Once | INTRAMUSCULAR | Status: AC
  Administered 2020-06-23: 50 ug via INTRAVENOUS
  Filled 2020-06-23: qty 2

## 2020-06-23 MED ORDER — ATORVASTATIN CALCIUM 40 MG PO TABS
40.0000 mg | ORAL_TABLET | Freq: Every day | ORAL | Status: DC
  Administered 2020-06-24 – 2020-06-25 (×2): 40 mg
  Filled 2020-06-23 (×2): qty 1

## 2020-06-23 MED ORDER — PROPOFOL 1000 MG/100ML IV EMUL
INTRAVENOUS | Status: AC
  Administered 2020-06-23: 45 ug/kg/min via INTRAVENOUS
  Filled 2020-06-23: qty 100

## 2020-06-23 MED ORDER — FENTANYL CITRATE (PF) 100 MCG/2ML IJ SOLN: 100.0000 ug | INTRAMUSCULAR | Status: DC | PRN

## 2020-06-23 MED ORDER — MIDAZOLAM HCL 2 MG/2ML IJ SOLN: 2.0000 mg | INTRAMUSCULAR | Status: DC | PRN

## 2020-06-23 MED ORDER — ETOMIDATE 2 MG/ML IV SOLN
INTRAVENOUS | Status: AC | PRN
  Administered 2020-06-23: 20 mg via INTRAVENOUS

## 2020-06-23 MED ORDER — FENTANYL CITRATE (PF) 100 MCG/2ML IJ SOLN
100.0000 ug | INTRAMUSCULAR | Status: DC | PRN
  Administered 2020-06-23: 100 ug via INTRAVENOUS
  Filled 2020-06-23 (×2): qty 2

## 2020-06-23 MED ORDER — ASPIRIN 325 MG PO TABS
325.0000 mg | ORAL_TABLET | Freq: Every day | ORAL | Status: DC
  Administered 2020-06-24 – 2020-06-25 (×2): 325 mg
  Filled 2020-06-23 (×2): qty 1

## 2020-06-23 MED ORDER — VITAL HIGH PROTEIN PO LIQD
1000.0000 mL | ORAL | Status: DC
  Administered 2020-06-23: 1000 mL
  Filled 2020-06-23: qty 1000

## 2020-06-23 MED ORDER — PROPOFOL 1000 MG/100ML IV EMUL
5.0000 ug/kg/min | INTRAVENOUS | Status: DC
  Administered 2020-06-23: 5 ug/kg/min via INTRAVENOUS

## 2020-06-23 MED ORDER — FUROSEMIDE 10 MG/ML IJ SOLN
40.0000 mg | Freq: Once | INTRAMUSCULAR | Status: AC
  Administered 2020-06-23: 40 mg via INTRAVENOUS
  Filled 2020-06-23: qty 4

## 2020-06-23 MED ORDER — FENTANYL 2500MCG IN NS 250ML (10MCG/ML) PREMIX INFUSION
50.0000 ug/h | INTRAVENOUS | Status: DC
  Administered 2020-06-23 – 2020-06-24 (×2): 50 ug/h via INTRAVENOUS
  Filled 2020-06-23 (×2): qty 250

## 2020-06-23 MED ORDER — DOCUSATE SODIUM 50 MG/5ML PO LIQD
100.0000 mg | Freq: Two times a day (BID) | ORAL | Status: DC
  Filled 2020-06-23: qty 10

## 2020-06-23 MED ORDER — ATORVASTATIN CALCIUM 40 MG PO TABS: 40.0000 mg | ORAL_TABLET | Freq: Every day | ORAL | Status: DC

## 2020-06-23 MED ORDER — DOCUSATE SODIUM 50 MG/5ML PO LIQD
100.0000 mg | Freq: Two times a day (BID) | ORAL | Status: DC
  Administered 2020-06-23 – 2020-06-24 (×2): 100 mg
  Filled 2020-06-23 (×4): qty 10

## 2020-06-23 MED ORDER — HEPARIN BOLUS VIA INFUSION
2850.0000 [IU] | Freq: Once | INTRAVENOUS | Status: AC
  Administered 2020-06-23: 2850 [IU] via INTRAVENOUS
  Filled 2020-06-23: qty 2850

## 2020-06-23 MED ORDER — PROPOFOL 1000 MG/100ML IV EMUL: 5.0000 ug/kg/min | INTRAVENOUS | Status: DC

## 2020-06-23 NOTE — Progress Notes (Signed)
CRITICAL VALUE ALERT  Critical Value:  Troponin 2,971  Date & Time Notied:  06/23/20 2227  Provider Notified: Arsenio Loader, MD  Orders Received/Actions taken: Continue current IV heparin

## 2020-06-23 NOTE — Progress Notes (Signed)
  Echocardiogram 2D Echocardiogram has been performed.  Pieter Partridge 06/23/2020, 11:07 AM

## 2020-06-23 NOTE — H&P (Signed)
NAME:  Phillip Kennedy, MRN:  [id number], DOB:  27-May-1956, LOS: 0 ADMISSION DATE:  06/23/2020, CONSULTATION DATE: 06/23/2020 REFERRING MD: Dr Bebe Shaggy, CHIEF COMPLAINT: Shortness of breath  Brief History   64 year old male with COPD and hypertension coming in with shortness of breath had to be emergently intubated   History of present illness   64 year old male with history of hypertension, COPD who came into the emergency room with shortness of breath he did not tolerate CPAP on arrival after being used by ~EMS and had to be emergently intubated on arrival to the emergency room.  Patient was severely hypertensive chest x-ray was showing bilateral pulmonary infiltrates worse on the right side he was started on nitroglycerin drip and was sedated.  Obviously patient was intubated and sedated when I came to assess him and was not able to give any history.  Wife was at bedside and she stated that he has been having worsening shortness of breath over the last month but denies any fever chills or rigors.  Patient did not complain to her about any chest pain.  Patient wife stated that he did have a tick bite but she could not find where.  As per wife patient did receive 2 shots of the Covid vaccine. Covid test is pending at the time of dictation.  Past Medical History  -COPD -Hypertension -Tobacco abuse     Antimicrobials:  None    Objective   Blood pressure (!) 153/98, pulse (!) 121, temperature 98.6 F (37 C), temperature source Rectal, resp. rate (!) 30, height 5\' 10"  (1.778 m), weight 104.3 kg, SpO2 (!) 88 %.    Vent Mode: PRVC FiO2 (%):  [100 %] 100 % Set Rate:  [22 bmp-30 bmp] 30 bmp Vt Set:  [580 mL] 580 mL PEEP:  [5 cmH20] 5 cmH20 Plateau Pressure:  [18 cmH20] 18 cmH20   Intake/Output Summary (Last 24 hours) at 06/23/2020 0235 Last data filed at 06/23/2020 0225 Gross per 24 hour  Intake 20.24 ml  Output --  Net 20.24 ml   Filed Weights   06/23/20 0045  Weight: 104.3 kg     Examination: General: Acutely ill on mechanical ventilation sedated not in distress HENT: Orally intubated Lungs: Coarse no wheezing no dullness Cardiovascular: Normal heart sound no sounds or murmurs Abdomen: Soft no tenderness no guarding  extremities: Right extremity is more swollen than left with some pitting edema Neuro: Sedated not following commands   Assessment & Plan:   -Acute hypoxemic hypercapnic respiratory failure require mechanical ventilation -Acute diastolic congestive heart failure -Acute pulmonary edema -Hypertensive emergency -Elevated troponin to rule out acute coronary syndrome    Plan: -Admit to the intensive care for further management -Adjust mechanical ventilation he is limited to strategy keep pulse ox above 92% -IV Lasix -Nitroglycerin drip and titrate to keep systolic blood pressure below 08/23/20 -Aspirin stat -First troponin was mildly elevated we will start heparin drip -Trend troponin every 6 hours -Covid test is pending -Follow urine output and renal function -Echocardiogram was ordered -DVT prophylaxis patient is already on heparin drip -GI prophylaxis -Discussed plan of care with wife at bedside and she verbalized understanding  Best practice:  Diet: N.p.o. Pain/Anxiety/Delirium protocol (if indicated): Sedated with propofol and fentanyl VAP protocol (if indicated):  DVT prophylaxis: On heparin drip GI prophylaxis: PPI Glucose control:  Mobility: Bedrest Code Status: Full code Family Communication: Wife Disposition: ICU admission  Labs   CBC: Recent Labs  Lab 06/23/20 0110 06/23/20 0137  WBC 5.5  --  NEUTROABS 2.3  --   HGB 14.2 14.6  HCT 47.8 43.0  MCV 91.0  --   PLT 221  --     Basic Metabolic Panel: Recent Labs  Lab 06/23/20 0110 06/23/20 0137  NA 145 143  K 3.6 4.2  CL 111  --   CO2 24  --   GLUCOSE 215*  --   BUN 19  --   CREATININE 2.25*  --   CALCIUM 9.3  --    GFR: Estimated Creatinine Clearance:  40.1 mL/min (A) (by C-G formula based on SCr of 2.25 mg/dL (H)). Recent Labs  Lab 06/23/20 0110  WBC 5.5    Liver Function Tests: Recent Labs  Lab 06/23/20 0110  AST 59*  ALT 59*  ALKPHOS 156*  BILITOT 0.6  PROT 7.4  ALBUMIN 3.6   No results for input(s): LIPASE, AMYLASE in the last 168 hours. No results for input(s): AMMONIA in the last 168 hours.  ABG    Component Value Date/Time   PHART 7.101 (LL) 06/23/2020 0137   PCO2ART 79.5 (HH) 06/23/2020 0137   PO2ART 119 (H) 06/23/2020 0137   HCO3 24.8 06/23/2020 0137   TCO2 27 06/23/2020 0137   ACIDBASEDEF 7.0 (H) 06/23/2020 0137   O2SAT 96.0 06/23/2020 0137     Chest x-ray: IMPRESSION: 1. Bilateral ground-glass airspace disease, right greater than left. Findings could reflect asymmetric edema or multifocal pneumonia. 2. No complication after intubation.   Review of Systems:   Could not be obtained due to sedation  Social History     Married continues to smoke  Family History   His family history is not on file.   Allergies Not on File   Home Medications  Prior to Admission medications   Not on File     Critical care time: I have spent 40 minutes of critical care time this time was spent at bedside or in the unit this time was exclusive of any billable procedures patient is needing intensive care due to complex medical problems requiring complex medical decisions.

## 2020-06-23 NOTE — Progress Notes (Signed)
ANTICOAGULATION CONSULT NOTE - Initial Consult  Pharmacy Consult for IV heparin Indication: chest pain/ACS  Not on File  Patient Measurements: Height: 5\' 10"  (177.8 cm) Weight: 104.3 kg (230 lb) IBW/kg (Calculated) : 73 Heparin Dosing Weight: 95.2 kg  Vital Signs: Temp: 98.6 F (37 C) (09/02 0120) Temp Source: Rectal (09/02 0120) BP: 153/98 (09/02 0209) Pulse Rate: 121 (09/02 0120)  Labs: Recent Labs    06/23/20 0110 06/23/20 0137  HGB 14.2 14.6  HCT 47.8 43.0  PLT 221  --   CREATININE 2.25*  --   TROPONINIHS 56*  --     Estimated Creatinine Clearance: 40.1 mL/min (A) (by C-G formula based on SCr of 2.25 mg/dL (H)).   Medical History: No past medical history on file.  Medications:  Infusions:  . fentaNYL infusion INTRAVENOUS    . nitroGLYCERIN Stopped (06/23/20 0241)  . propofol (DIPRIVAN) infusion 25 mcg/kg/min (06/23/20 0243)    Assessment: 64 yo male admitted with respiratory failure, hypertensive emergency.  Pharmacy asked to start IV heparin for suspected ACS.  No known anticoagulants PTA.  Goal of Therapy:  Heparin level 0.3-0.7 units/ml Monitor platelets by anticoagulation protocol: Yes   Plan:  Start IV heparin with a bolus of 4000 units x 1. Then start heparin gtt at 1300 units/hr. Check heparin level in 8 hrs. Daily heparin level and CBC. F/u plans for ischemic workup.  77, Reece Leader, BCCP Clinical Pharmacist  06/23/2020 2:48 AM   [facility] pharmacy phone numbers are listed on [email or web address]

## 2020-06-23 NOTE — ED Triage Notes (Signed)
Pt coming with [facility] after wife called 911 due to pt having acute resp distress and SOB. Pt began to yell "I can't breathe" and was clutching at chest. Pt diaphoretic and GCS 15 at the scene. Pt combative, diaphoretic, and in distress in Ed. Pt hypertensive and tachy upon arrival. Received solumedrol, nitro, nebs, mag, PTA.

## 2020-06-23 NOTE — ED Notes (Signed)
Dr. Katrinka Blazing notified latest troponin value

## 2020-06-23 NOTE — Progress Notes (Signed)
Transported patient from ED to 2M09 without event.

## 2020-06-23 NOTE — ED Notes (Signed)
Pt noted to be coughing and opening eyes at this time. Propofol increased based on MAR med admin instructions.

## 2020-06-23 NOTE — ED Notes (Signed)
Echo at bedside

## 2020-06-23 NOTE — Progress Notes (Signed)
Seen and examined in ER. Heavily sedated on vent. O2 requirements improving.  Note UDS + for cocaine Echo pending  A: Acute hypoxemic respiratory failure in setting of tobacco abuse, cocaine in urine, multifocal airspace disease or atypical edema pattern on CXR.   - Add steroids in case crack lung - Check Pct, sputum cx, hold abx for now - Wean FiO2 as tolerated - f/u echo, trend trops, low threshold to stop heparin drip if no WMA - start TF, SSI  Additional 40 mins cc time Myrla Halsted MD PCCM

## 2020-06-23 NOTE — ED Notes (Signed)
Pt's IV (left FA) with heparin running noted to be infiltrated during bolus; heparin paused and restarted in another IV; pharmacy consulted, Dr. Katrinka Blazing notified; per Erling Cruz, heparin not a vesicant, no further action needed at this time other than to remove the IV; IV removed; no redness or heat noted; will continue to monitor

## 2020-06-23 NOTE — ED Notes (Signed)
Attempted straight stick x2. Lab called for blood draw.

## 2020-06-23 NOTE — ED Notes (Signed)
MD Aljishi made aware of pt critical troponin of 178, pt on heparin , no new orders at this time

## 2020-06-23 NOTE — ED Notes (Signed)
Attempted report x1. 

## 2020-06-23 NOTE — ED Notes (Signed)
Pt noted to be jerking and opening eyes. BP has increased. Rate change for propofol at this time.

## 2020-06-23 NOTE — Progress Notes (Addendum)
eLink Physician-Brief Progress Note Patient Name: Phillip Kennedy DOB: 02/17/1956 MRN: [id number]   Date of Service  06/23/2020  HPI/Events of Note  Multiple issues: ABG on 40%/PRVC 25/TV 580/P 8 = 7.334/39.4/63 and 2. Troponin = 56 --> 78 --> 1305 --> 2971. Patient is already on ASA and a Heparin IV infusion. Cardiac Echo done, however, it has not been read yet.   eICU Interventions  Plan: 1. Continue present management.      Intervention Category Major Interventions: Acid-Base disturbance - evaluation and management;Respiratory failure - evaluation and management  Lenell Antu 06/23/2020, 10:32 PM

## 2020-06-23 NOTE — Progress Notes (Signed)
ANTICOAGULATION CONSULT NOTE - Initial Consult  Pharmacy Consult for IV heparin Indication: chest pain/ACS  No Known Allergies  Patient Measurements: Height: 5\' 10"  (177.8 cm) Weight: 104.3 kg (230 lb) IBW/kg (Calculated) : 73 Heparin Dosing Weight: 95.2 kg  Vital Signs: BP: 134/91 (09/02 1315) Pulse Rate: 72 (09/02 1315)  Labs: Recent Labs    06/23/20 0110 06/23/20 0110 06/23/20 0137 06/23/20 0424 06/23/20 1001 06/23/20 1333  HGB 14.2   < > 14.6 13.5  --   --   HCT 47.8  --  43.0 45.3  --   --   PLT 221  --   --  170  --   --   HEPARINUNFRC  --   --   --   --   --  <0.10*  CREATININE 2.25*  --   --  2.29*  --   --   TROPONINIHS 56*  --   --  178* 1,305*  --    < > = values in this interval not displayed.    Estimated Creatinine Clearance: 39.4 mL/min (A) (by C-G formula based on SCr of 2.29 mg/dL (H)).   Medical History: No past medical history on file.  Medications:  Infusions:  . fentaNYL infusion INTRAVENOUS 50 mcg/hr (06/23/20 0332)  . heparin 1,300 Units/hr (06/23/20 0320)  . nitroGLYCERIN Stopped (06/23/20 0241)  . propofol (DIPRIVAN) infusion 45 mcg/kg/min (06/23/20 1153)    Assessment: 64 yo male admitted with respiratory failure, hypertensive emergency.  Pharmacy asked to start IV heparin for suspected ACS.  No known anticoagulants PTA.  8-hr HL is undetectable. Troponins are trending up, now 1,305. No mention of heparin drip being off/complications. CBC WNL.   Goal of Therapy:  Heparin level 0.3-0.7 units/ml Monitor platelets by anticoagulation protocol: Yes   Plan:  Rebolus heparin 2,850 units x1. Increase heparin gtt rate to 1,650 units/hr. Recheck heparin level in 8 hrs. Follow daily heparin level and CBC.  77, PharmD PGY1 Acute Care Pharmacy Resident Please refer to [facility] for unit-specific pharmacist

## 2020-06-23 NOTE — Progress Notes (Signed)
eLink Physician-Brief Progress Note Patient Name: Phillip Kennedy DOB: [birth year] MRN: [id number]   Date of Service  06/23/2020  HPI/Events of Note  Last pH = 7.101. Has not had repeat ABG in over 18 hours as patient was held in ED awaiting an ICU bed.   eICU Interventions  Plan: 1. ABG STAT.     Intervention Category Major Interventions: Acid-Base disturbance - evaluation and management;Respiratory failure - evaluation and management  Lenell Antu 06/23/2020, 8:33 PM

## 2020-06-23 NOTE — ED Notes (Signed)
Pt awake and coughing over ETT. Therapeutic communication provided asked pt to relax. Increased propofol at this time.

## 2020-06-23 NOTE — ED Notes (Signed)
Pt with eyes opening and coughing and attempting to sit up at this time. Propofol has been increased and provider made aware. Awaiting orders for medications

## 2020-06-23 NOTE — ED Provider Notes (Signed)
[facility] EMERGENCY DEPARTMENT Provider Note   CSN: [id number] Arrival date & time: 06/23/20  5329     History Chief Complaint - respiratory distress  Level 5 caveat due to acuity of condition  Phillip Kennedy is a 64 y.o. male.  The history is provided by the EMS personnel. The history is limited by the condition of the patient.  Shortness of Breath Severity:  Severe Onset quality:  Sudden Timing:  Constant Progression:  Worsening Chronicity:  New Relieved by:  None tried Worsened by:  Nothing Patient presents via EMS in respiratory distress.  It's reported pt was in distress on their arrival.  He was placed on CPAP and became agitated en route No other details known on arrival      PMH-unknown Soc hx - unknown Social History   Tobacco Use  . Smoking status: Not on file  Substance Use Topics  . Alcohol use: Not on file  . Drug use: Not on file    Home Medications Prior to Admission medications   Not on File    Allergies    Patient has no allergy information on record.  Review of Systems   Review of Systems  Unable to perform ROS: Acuity of condition  Respiratory: Positive for shortness of breath.     Physical Exam Updated Vital Signs BP (!) 166/140   Pulse (!) 118   Resp 20   Ht 1.778 m (5\' 10" )   Wt 104.3 kg   SpO2 (!) 88%   BMI 33.00 kg/m   Physical Exam CONSTITUTIONAL: Distressed, agitated HEAD: Normocephalic/atraumatic EYES: EOMI/PERRL ENMT: CPAP mask in place NECK: supple no meningeal signs SPINE/BACK:entire spine nontender CV: S1/S2 noted, tachycardic LUNGS: Respiratory distress noted, crackles bilaterally  ABDOMEN: soft, nontender NEURO: Pt is awake/alert but appears agitated and is not responding to voice commands, he moves all extremities x4 EXTREMITIES: pulses normal/equal, edema noted to right lower extremity-appears chronic SKIN: Diaphoretic PSYCH: Agitated  ED Results / Procedures / Treatments   Labs (all  labs ordered are listed, but only abnormal results are displayed) Labs Reviewed  CBC WITH DIFFERENTIAL/PLATELET - Abnormal; Notable for the following components:      Result Value   MCHC 29.7 (*)    All other components within normal limits  COMPREHENSIVE METABOLIC PANEL - Abnormal; Notable for the following components:   Glucose, Bld 215 (*)    Creatinine, Ser 2.25 (*)    AST 59 (*)    ALT 59 (*)    Alkaline Phosphatase 156 (*)    GFR calc non Af Amer 30 (*)    GFR calc Af Amer 34 (*)    All other components within normal limits  I-STAT ARTERIAL BLOOD GAS, ED - Abnormal; Notable for the following components:   pH, Arterial 7.101 (*)    pCO2 arterial 79.5 (*)    pO2, Arterial 119 (*)    Acid-base deficit 7.0 (*)    All other components within normal limits  TROPONIN I (HIGH SENSITIVITY) - Abnormal; Notable for the following components:   Troponin I (High Sensitivity) 56 (*)    All other components within normal limits  SARS CORONAVIRUS 2 BY RT PCR (HOSPITAL ORDER, PERFORMED IN  HOSPITAL LAB)  BRAIN NATRIURETIC PEPTIDE  RAPID URINE DRUG SCREEN, HOSP PERFORMED  HIV ANTIBODY (ROUTINE TESTING W REFLEX)  TRIGLYCERIDES  BASIC METABOLIC PANEL  CBC WITH DIFFERENTIAL/PLATELET  TROPONIN I (HIGH SENSITIVITY)  TROPONIN I (HIGH SENSITIVITY)    EKG EKG Interpretation  Date/Time:  Thursday June 23 2020 01:06:46 EDT Ventricular Rate:  123 PR Interval:    QRS Duration: 102 QT Interval:  337 QTC Calculation: 483 R Axis:   79 Text Interpretation: Sinus tachycardia Atrial premature complexes LAE, consider biatrial enlargement Left ventricular hypertrophy Borderline T abnormalities, lateral leads Borderline prolonged QT interval No previous ECGs available Abnormal ekg Confirmed by Zadie Rhine ([id number]) on 06/23/2020 1:12:05 AM   Radiology DG Chest Port 1 View  Result Date: 06/23/2020 CLINICAL DATA:  Short of breath, intubated EXAM: PORTABLE CHEST 1 VIEW COMPARISON:   12/09/2016 FINDINGS: 2 frontal views of the chest demonstrate endotracheal tube overlying tracheal air column tip just below thoracic inlet. The cardiac silhouette is enlarged. There is bilateral airspace disease, right greater than left. No effusion or pneumothorax. No acute bony abnormalities. IMPRESSION: 1. Bilateral ground-glass airspace disease, right greater than left. Findings could reflect asymmetric edema or multifocal pneumonia. 2. No complication after intubation. Electronically Signed   By: Sharlet Salina M.D.   On: 06/23/2020 01:12    Procedures .Critical Care Performed by: Zadie Rhine, MD Authorized by: Zadie Rhine, MD   Critical care provider statement:    Critical care time (minutes):  45   Critical care start time:  06/23/2020 12:45 AM   Critical care end time:  06/23/2020 1:30 AM   Critical care time was exclusive of:  Separately billable procedures and treating other patients   Critical care was necessary to treat or prevent imminent or life-threatening deterioration of the following conditions:  Respiratory failure, sepsis and cardiac failure   Critical care was time spent personally by me on the following activities:  Examination of patient, evaluation of patient's response to treatment, discussions with consultants, re-evaluation of patient's condition, pulse oximetry, ordering and review of radiographic studies, ordering and review of laboratory studies, ordering and performing treatments and interventions and review of old charts   I assumed direction of critical care for this patient from another provider in my specialty: no   Procedure Name: Intubation Date/Time: 06/23/2020 1:00 AM Performed by: Zadie Rhine, MD Pre-anesthesia Checklist: Suction available and Patient being monitored Oxygen Delivery Method: Non-rebreather mask Preoxygenation: Pre-oxygenation with 100% oxygen Induction Type: Rapid sequence Laryngoscope Size: Glidescope Grade View: Grade I Tube  size: 7.5 mm Number of attempts: 1 Airway Equipment and Method: Video-laryngoscopy Placement Confirmation: ETT inserted through vocal cords under direct vision,  CO2 detector and Breath sounds checked- equal and bilateral Secured at: 24 cm Tube secured with: ETT holder        Medications Ordered in ED Medications  nitroGLYCERIN 50 mg in dextrose 5 % 250 mL (0.2 mg/mL) infusion (35 mcg/min Intravenous Rate/Dose Change 06/23/20 0159)  docusate sodium (COLACE) capsule 100 mg (has no administration in time range)  polyethylene glycol (MIRALAX / GLYCOLAX) packet 17 g (has no administration in time range)  pantoprazole (PROTONIX) injection 40 mg (has no administration in time range)  ipratropium-albuterol (DUONEB) 0.5-2.5 (3) MG/3ML nebulizer solution 3 mL (has no administration in time range)  fentaNYL (SUBLIMAZE) injection 100 mcg (100 mcg Intravenous Given 06/23/20 0227)  fentaNYL (SUBLIMAZE) injection 100 mcg (has no administration in time range)  midazolam (VERSED) injection 2 mg (2 mg Intravenous Given 06/23/20 0227)  midazolam (VERSED) injection 2 mg (has no administration in time range)  docusate (COLACE) 50 MG/5ML liquid 100 mg (has no administration in time range)  polyethylene glycol (MIRALAX / GLYCOLAX) packet 17 g (has no administration in time range)  propofol (DIPRIVAN) 1000 MG/100ML infusion (50  mcg/kg/min  104.3 kg Intravenous New Bag/Given 06/23/20 0226)  fentaNYL (SUBLIMAZE) injection 50 mcg (has no administration in time range)  fentaNYL in NS (5mcg/ml) infusion-PREMIX (has no administration in time range)  fentaNYL (SUBLIMAZE) bolus via infusion 50 mcg (has no administration in time range)  aspirin tablet 325 mg (has no administration in time range)  etomidate (AMIDATE) injection (20 mg Intravenous Given 06/23/20 0037)  rocuronium (ZEMURON) injection (80 mg Intravenous Given 06/23/20 0037)  furosemide (LASIX) injection 40 mg (40 mg Intravenous Given 06/23/20 0227)     ED Course  I have reviewed the triage vital signs and the nursing notes.  Pertinent labs & imaging results that were available during my care of the patient were reviewed by me and considered in my medical decision making (see chart for details).    MDM Rules/Calculators/A&P                          1:35 AM Patient seen on arrival in respiratory distress.  Patient was hypertensive, diaphoretic and agitated on CPAP.  Patient with impending respiratory failure I elected to intubate patient, this proceeded without difficulty Patient was placed on a propofol drip for sedation, and a nitro drip was also applied for presumed hypertensive emergency and pulmonary edema. Patient is now resting comfortably on the ventilator.  I personally reviewed the chest x-ray. Labs are pending at this time.  I have discussed the case with critical care who will admit the patient 2:35 AM Patient resting comfortably on the ventilator.  Wife is at bedside and I updated her on plan.  I discussed the case with the critical care attending, patient will be admitted to the intensive care unit ABG was reviewed, and respiratory therapy was able to make appropriate adjustments on the ventilator including increased respiratory rate Final Clinical Impression(s) / ED Diagnoses Final diagnoses:  Acute respiratory failure with hypoxia [facility])  Hypertensive emergency  AKI (acute kidney injury) [facility])    Rx / DC Orders ED Discharge Orders    None       Zadie Rhine, MD 06/23/20 0236

## 2020-06-24 ENCOUNTER — Inpatient Hospital Stay (HOSPITAL_COMMUNITY): Payer: Medicaid Other

## 2020-06-24 LAB — CBC
HCT: 39.2 % (ref 39.0–52.0)
Hemoglobin: 11.8 g/dL — ABNORMAL LOW (ref 13.0–17.0)
MCH: 26.9 pg (ref 26.0–34.0)
MCHC: 30.1 g/dL (ref 30.0–36.0)
MCV: 89.5 fL (ref 80.0–100.0)
Platelets: 175 10*3/uL (ref 150–400)
RBC: 4.38 MIL/uL (ref 4.22–5.81)
RDW: 13.5 % (ref 11.5–15.5)
WBC: 7.8 10*3/uL (ref 4.0–10.5)
nRBC: 0 % (ref 0.0–0.2)

## 2020-06-24 LAB — GLUCOSE, CAPILLARY
Glucose-Capillary: 143 mg/dL — ABNORMAL HIGH (ref 70–99)
Glucose-Capillary: 139 mg/dL — ABNORMAL HIGH (ref 70–99)
Glucose-Capillary: 123 mg/dL — ABNORMAL HIGH (ref 70–99)
Glucose-Capillary: 67 mg/dL — ABNORMAL LOW (ref 70–99)
Glucose-Capillary: 70 mg/dL (ref 70–99)
Glucose-Capillary: 111 mg/dL — ABNORMAL HIGH (ref 70–99)
Glucose-Capillary: 96 mg/dL (ref 70–99)

## 2020-06-24 LAB — POCT I-STAT 7, (LYTES, BLD GAS, ICA,H+H)
Acid-base deficit: 3 mmol/L — ABNORMAL HIGH (ref 0.0–2.0)
Bicarbonate: 22.7 mmol/L (ref 20.0–28.0)
Calcium, Ion: 1.25 mmol/L (ref 1.15–1.40)
HCT: 33 % — ABNORMAL LOW (ref 39.0–52.0)
Hemoglobin: 11.2 g/dL — ABNORMAL LOW (ref 13.0–17.0)
O2 Saturation: 96 %
Patient temperature: 99.1
Potassium: 4.4 mmol/L (ref 3.5–5.1)
Sodium: 141 mmol/L (ref 135–145)
TCO2: 24 mmol/L (ref 22–32)
pCO2 arterial: 43.8 mmHg (ref 32.0–48.0)
pH, Arterial: 7.323 — ABNORMAL LOW (ref 7.350–7.450)
pO2, Arterial: 90 mmHg (ref 83.0–108.0)

## 2020-06-24 LAB — BASIC METABOLIC PANEL
Anion gap: 13 (ref 5–15)
BUN: 35 mg/dL — ABNORMAL HIGH (ref 8–23)
CO2: 19 mmol/L — ABNORMAL LOW (ref 22–32)
Calcium: 9.2 mg/dL (ref 8.9–10.3)
Chloride: 109 mmol/L (ref 98–111)
Creatinine, Ser: 2.79 mg/dL — ABNORMAL HIGH (ref 0.61–1.24)
GFR calc Af Amer: 27 mL/min — ABNORMAL LOW (ref >60)
GFR calc non Af Amer: 23 mL/min — ABNORMAL LOW (ref >60)
Glucose, Bld: 153 mg/dL — ABNORMAL HIGH (ref 70–99)
Potassium: 5.1 mmol/L (ref 3.5–5.1)
Sodium: 141 mmol/L (ref 135–145)

## 2020-06-24 LAB — HEPARIN LEVEL (UNFRACTIONATED)
Heparin Unfractionated: 0.58 IU/mL (ref 0.30–0.70)
Heparin Unfractionated: 0.79 IU/mL — ABNORMAL HIGH (ref 0.30–0.70)
Heparin Unfractionated: 0.79 IU/mL — ABNORMAL HIGH (ref 0.30–0.70)

## 2020-06-24 LAB — SEDIMENTATION RATE: Sed Rate: 15 mm/hr (ref 0–16)

## 2020-06-24 LAB — ECHOCARDIOGRAM COMPLETE
Area-P 1/2: 2.84 cm2
Height: 70 in
S' Lateral: 5.7 cm
Weight: 3680 oz

## 2020-06-24 LAB — C-REACTIVE PROTEIN: CRP: 0.8 mg/dL (ref <1.0)

## 2020-06-24 LAB — MAGNESIUM
Magnesium: 2.1 mg/dL (ref 1.7–2.4)
Magnesium: 2.2 mg/dL (ref 1.7–2.4)

## 2020-06-24 LAB — STREP PNEUMONIAE URINARY ANTIGEN: Strep Pneumo Urinary Antigen: NEGATIVE

## 2020-06-24 LAB — PROCALCITONIN: Procalcitonin: 11.17 ng/mL

## 2020-06-24 LAB — PHOSPHORUS
Phosphorus: 4.9 mg/dL — ABNORMAL HIGH (ref 2.5–4.6)
Phosphorus: 5 mg/dL — ABNORMAL HIGH (ref 2.5–4.6)

## 2020-06-24 LAB — MRSA PCR SCREENING: MRSA by PCR: NEGATIVE

## 2020-06-24 LAB — TRIGLYCERIDES: Triglycerides: 92 mg/dL (ref <150)

## 2020-06-24 MED ORDER — ALBUTEROL SULFATE (2.5 MG/3ML) 0.083% IN NEBU: 3.0000 mL | INHALATION_SOLUTION | RESPIRATORY_TRACT | Status: DC | PRN

## 2020-06-24 MED ORDER — ORAL CARE MOUTH RINSE
15.0000 mL | Freq: Two times a day (BID) | OROMUCOSAL | Status: DC
  Administered 2020-06-24 – 2020-06-28 (×7): 15 mL via OROMUCOSAL

## 2020-06-24 MED ORDER — DOCUSATE SODIUM 50 MG/5ML PO LIQD
100.0000 mg | Freq: Two times a day (BID) | ORAL | Status: DC
  Administered 2020-06-25 – 2020-06-26 (×4): 100 mg via ORAL
  Filled 2020-06-24 (×4): qty 10

## 2020-06-24 MED ORDER — SODIUM CHLORIDE 0.9 % IV SOLN
2.0000 g | INTRAVENOUS | Status: DC
  Administered 2020-06-24 – 2020-06-27 (×4): 2 g via INTRAVENOUS
  Filled 2020-06-24 (×3): qty 20
  Filled 2020-06-24 (×2): qty 2
  Filled 2020-06-24: qty 20

## 2020-06-24 MED ORDER — BUDESONIDE 0.25 MG/2ML IN SUSP
0.2500 mg | Freq: Two times a day (BID) | RESPIRATORY_TRACT | Status: DC
  Administered 2020-06-24 – 2020-06-29 (×11): 0.25 mg via RESPIRATORY_TRACT
  Filled 2020-06-24 (×11): qty 2

## 2020-06-24 MED ORDER — HYDRALAZINE HCL 50 MG PO TABS
25.0000 mg | ORAL_TABLET | Freq: Three times a day (TID) | ORAL | Status: DC
  Administered 2020-06-24 – 2020-06-25 (×3): 25 mg via ORAL
  Filled 2020-06-24 (×3): qty 1

## 2020-06-24 MED ORDER — DEXTROSE 50 % IV SOLN
INTRAVENOUS | Status: AC
  Filled 2020-06-24: qty 50

## 2020-06-24 MED ORDER — AMLODIPINE BESYLATE 5 MG PO TABS
5.0000 mg | ORAL_TABLET | Freq: Every day | ORAL | Status: DC
  Administered 2020-06-24 – 2020-06-25 (×2): 5 mg via ORAL
  Filled 2020-06-24 (×2): qty 1

## 2020-06-24 MED ORDER — CLONIDINE HCL 0.2 MG PO TABS
0.2000 mg | ORAL_TABLET | Freq: Every day | ORAL | Status: DC
  Administered 2020-06-24 – 2020-06-28 (×5): 0.2 mg via ORAL
  Filled 2020-06-24 (×5): qty 1

## 2020-06-24 MED ORDER — DEXTROSE 5 % IV SOLN: INTRAVENOUS | Status: AC

## 2020-06-24 MED ORDER — SODIUM CHLORIDE 0.9 % IV SOLN
100.0000 mg | Freq: Two times a day (BID) | INTRAVENOUS | Status: DC
  Administered 2020-06-24 – 2020-06-26 (×6): 100 mg via INTRAVENOUS
  Filled 2020-06-24 (×7): qty 100

## 2020-06-24 MED ORDER — ARFORMOTEROL TARTRATE 15 MCG/2ML IN NEBU
15.0000 ug | INHALATION_SOLUTION | Freq: Two times a day (BID) | RESPIRATORY_TRACT | Status: DC
  Administered 2020-06-24 – 2020-06-29 (×11): 15 ug via RESPIRATORY_TRACT
  Filled 2020-06-24 (×13): qty 2

## 2020-06-24 MED ORDER — VITAL AF 1.2 CAL PO LIQD: 1000.0000 mL | ORAL | Status: DC

## 2020-06-24 MED ORDER — SODIUM CHLORIDE 0.9 % IV SOLN
INTRAVENOUS | Status: DC | PRN
  Administered 2020-06-24: 500 mL via INTRAVENOUS

## 2020-06-24 NOTE — Progress Notes (Signed)
ANTICOAGULATION CONSULT NOTE  Pharmacy Consult for IV heparin Indication: chest pain/ACS  No Known Allergies  Patient Measurements: Height: 5\' 10"  (177.8 cm) Weight: 94.6 kg (208 lb 8.9 oz) IBW/kg (Calculated) : 73 Heparin Dosing Weight: 95.2 kg  Vital Signs: Temp: 99.1 F (37.3 C) (09/03 0731) Temp Source: Bladder (09/03 0400) BP: 104/82 (09/03 0731) Pulse Rate: 74 (09/03 0731)  Labs: Recent Labs    06/23/20 0110 06/23/20 0137 06/23/20 0424 06/23/20 0424 06/23/20 1001 06/23/20 1333 06/23/20 2100 06/23/20 2209 06/23/20 2209 06/24/20 0040 06/24/20 0803 06/24/20 0838  HGB 14.2   < > 13.5   < >  --   --   --  11.9*   < > 11.8*  --  11.2*  HCT 47.8   < > 45.3   < >  --   --   --  35.0*  --  39.2  --  33.0*  PLT 221  --  170  --   --   --   --   --   --  175  --   --   HEPARINUNFRC  --   --   --   --   --  <0.10*  --   --   --  0.58 0.79*  --   CREATININE 2.25*  --  2.29*  --   --   --   --   --   --  2.79*  --   --   TROPONINIHS 56*   < > 178*  --  1,305*  --  2,971*  --   --   --   --   --    < > = values in this interval not displayed.    Estimated Creatinine Clearance: 30.9 mL/min (A) (by C-G formula based on SCr of 2.79 mg/dL (H)).   Assessment: 64 yo male admitted with respiratory failure and hypertensive emergency.  Pharmacy asked to start IV heparin for suspected ACS. Troponins elevated x3  No known anticoagulants PTA. No signs of bleeding and IV site secure upon exam. HL this morning is 0.79 on 1650units/hr which is supratherapeutic. CBC is stable. Confirmed heparin was running at correct rate and will dose reduce.    Goal of Therapy:  Heparin level 0.3-0.7 units/ml Monitor platelets by anticoagulation protocol: Yes   Plan:  Reduce heparin to 1550 units/hr. 6 hour heparin level ordered for 1530  Monitor for s/sx of bleeding F/u daily heparin level to confirm therapeutic  77, PharmD, [facility] Pharmacy Resident [phone] 06/24/2020 9:20 AM

## 2020-06-24 NOTE — Progress Notes (Signed)
NAME:  Phillip Kennedy, MRN:  [id number], DOB:  [birth year], LOS: 1 ADMISSION DATE:  06/23/2020, CONSULTATION DATE: 06/23/2020 REFERRING MD: Dr Bebe Shaggy, CHIEF COMPLAINT: Shortness of breath  Brief History   64 year old male with COPD and hypertension coming in with shortness of breath had to be emergently intubated   History of present illness   64 year old male with history of hypertension, COPD who came into the emergency room with shortness of breath he did not tolerate CPAP on arrival after being used by ~EMS and had to be emergently intubated on arrival to the emergency room.  Patient was severely hypertensive chest x-ray was showing bilateral pulmonary infiltrates worse on the right side he was started on nitroglycerin drip and was sedated.  Obviously patient was intubated and sedated when I came to assess him and was not able to give any history.  Wife was at bedside and she stated that he has been having worsening shortness of breath over the last month but denies any fever chills or rigors.  Patient did not complain to her about any chest pain.  Patient wife stated that he did have a tick bite but she could not find where.  As per wife patient did receive 2 shots of the Covid vaccine. Covid test is pending at the time of dictation.  Past Medical History  -COPD -Hypertension -Tobacco abuse Significant Hospital Events   Intubated 9/2>>  Consults:  Cardiology  Procedures:    Significant Diagnostic Tests:  CXR 9/3: ET tube 6-7cm above carina. Bilateral airspace opacities R>L.  Micro Data:  MRSA negative Tracheal aspirate 9/3>>  Antimicrobials:  Doxycycline 9/3>> Ceftriaxone 9/3>>  Interim history/subjective:  No acute events overnight. Patient weaned off antihypertensive agents. Troponin continues to climb. Patient sedated this AM.  Objective   Blood pressure 98/70, pulse 73, temperature 98.6 F (37 C), resp. rate (!) 30, height 5\' 10"  (1.778 m), weight 94.6 kg, SpO2 100 %.     Vent Mode: PRVC FiO2 (%):  [40 %-50 %] 40 % Set Rate:  [30 bmp] 30 bmp Vt Set:  [580 mL] 580 mL PEEP:  [5 cmH20-8 cmH20] 5 cmH20 Plateau Pressure:  [18 cmH20-19 cmH20] 18 cmH20   Intake/Output Summary (Last 24 hours) at 06/24/2020 1004 Last data filed at 06/24/2020 0700 Gross per 24 hour  Intake 1672.59 ml  Output 1070 ml  Net 602.59 ml   Filed Weights   06/23/20 0045 06/23/20 2030 06/24/20 0400  Weight: 104.3 kg 94.4 kg 94.6 kg    Examination: General: No acute distress, intubated, sedated HENT: Orally intubated, sclera anicteric Lungs: diminished breath sounds, no wheezes, rhonchi or rales Cardiovascular: rrr, no mumur Abdomen: Soft, non-tender, non-distended, normal active bowel sounds extremities: no lower extremity edema Neuro: Sedated   Assessment & Plan:   Acute Hypoxemic and Hypercapnic respiratory failure Multifactorial in setting of underlying COPD with acute pulmonary edema in setting of hypertensive emergency. Also concern for inhalational injury related to cocaine use and possible community acquired pneumonia in setting of elevated procalcitonin.  - Continue ventilator support. 6-8cc/kg of ideal body weight. Currently on 40% FiO2 and PEEP 5.  - SBT this AM once sedation is weaned - Start ceftriaxone + doxycycline for concern of CAP - Check respiratory culture, urine strep pna and legionella ag - Less concern for inhalational injury at this time given low CRP, will stop steroids  NSETMI  In setting of hypertensive emergency and cocaine use - Continue aspirin and atorvastatin - Continue heparin drip per ACS protocol,  pharmacy consulted - Will consult cardiology for further evaluation - Repeat EKG this AM - ECHO report is pending  Hypertensive Emergency - Initially required nitro gtt and has been weaned off antihypertensive agents at this time  Acute Diastolic Congestive Heart Failure  In setting of hypertensive emergency - No diuresis at this time  Acute  on chronic kidney Disease In setting of respiratory failure and hypoxemia - Unknown baseline, Cr 2.79 from 2.25 on admission - Proteinuria notable on UA - Check renal US - Trend renal function  COPD, not in acute exacerbation - Start brovana and budesonide nebs - PRN duonebs  Best practice:  Diet: N.p.o. Pain/Anxiety/Delirium protocol (if indicated): Sedated with propofol and fentanyl VAP protocol (if indicated): ordered DVT prophylaxis: On heparin drip GI prophylaxis: PPI Glucose control: SSI Mobility: Bedrest Code Status: Full code Family Communication: Wife Disposition: ICU   Labs   CBC: Recent Labs  Lab 06/23/20 0110 06/23/20 0110 06/23/20 0137 06/23/20 0424 06/23/20 2209 06/24/20 0040 06/24/20 0838  WBC 5.5  --   --  8.4  --  7.8  --   NEUTROABS 2.3  --   --  7.2  --   --   --   HGB 14.2   < > 14.6 13.5 11.9* 11.8* 11.2*  HCT 47.8   < > 43.0 45.3 35.0* 39.2 33.0*  MCV 91.0  --   --  91.9  --  89.5  --   PLT 221  --   --  170  --  175  --    < > = values in this interval not displayed.    Basic Metabolic Panel: Recent Labs  Lab 06/23/20 0110 06/23/20 0110 06/23/20 0137 06/23/20 0424 06/23/20 1001 06/23/20 2100 06/23/20 2209 06/24/20 0040 06/24/20 0838  NA 145   < > 143 143  --   --  142 141 141  K 3.6   < > 4.2 4.4  --   --  4.4 5.1 4.4  CL 111  --   --  109  --   --   --  109  --   CO2 24  --   --  21*  --   --   --  19*  --   GLUCOSE 215*  --   --  124*  --   --   --  153*  --   BUN 19  --   --  20  --   --   --  35*  --   CREATININE 2.25*  --   --  2.29*  --   --   --  2.79*  --   CALCIUM 9.3  --   --  9.4  --   --   --  9.2  --   MG  --   --   --   --  2.0 2.0  --  2.1  --   PHOS  --   --   --   --  3.5 4.6  --  4.9*  --    < > = values in this interval not displayed.   GFR: Estimated Creatinine Clearance: 30.9 mL/min (A) (by C-G formula based on SCr of 2.79 mg/dL (H)). Recent Labs  Lab 06/23/20 0110 06/23/20 0424 06/24/20 0040  06/24/20 0803  PROCALCITON  --   --   --  11.17  WBC 5.5 8.4 7.8  --     Liver Function Tests: Recent Labs  Lab 06/23/20 0110  AST 59*  ALT 59*  ALKPHOS 156*  BILITOT 0.6  PROT 7.4  ALBUMIN 3.6   No results for input(s): LIPASE, AMYLASE in the last 168 hours. No results for input(s): AMMONIA in the last 168 hours.  ABG    Component Value Date/Time   PHART 7.323 (L) 06/24/2020 0838   PCO2ART 43.8 06/24/2020 0838   PO2ART 90 06/24/2020 0838   HCO3 22.7 06/24/2020 0838   TCO2 24 06/24/2020 0838   ACIDBASEDEF 3.0 (H) 06/24/2020 0838   O2SAT 96.0 06/24/2020 0838     Critical care time: I have spent 45 minutes of critical care time this time was spent at bedside or in the unit this time was exclusive of any billable procedures patient is needing intensive care due to complex medical problems requiring complex medical decisions.    Melody Comas, MD [facility] Office: [phone]   See Amion for Pager Details

## 2020-06-24 NOTE — Consult Note (Signed)
Referring Physician: Katrinka Blazing, Md   Phillip Kennedy is an 64 y.o. male.                       Chief Complaint: NSTEMI  HPI: 64 years old black male with PMH of hypertension, obesity, knee pain, hyperlipidemia and CKD, IIIa presented with acute respiratory failure with congestive heart failure and NSTEMI along with evidence of cocaine use. Patient was intubated yesterday and has been extubated prior to seeing him today. Patient's old medical record is being merged. He denies chest pain. 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 His wife is present during examination and discussion about management of his condition. 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 His BNP is elevated at 829.3 pgm. EKG is suggestive of NSTEMI/diffuse ischemia. Echocardiogram shows dilated LV, severe LV systolic dysfunction with EF 25-30 %. Troponin I is elevated at 2971 ng.  No past medical history on file.  See old record   The histories are not reviewed yet. Please review them in the "History" navigator section and refresh this SmartLink.  No family history on file. See old record Social History:  has no history on file for tobacco use, alcohol use, and drug use. See old record  Allergies: No Known Allergies  Medications Prior to Admission  Medication Sig Dispense Refill  . amLODipine (NORVASC) 5 MG tablet Take 5 mg by mouth daily.    Marland Kitchen atorvastatin (LIPITOR) 40 MG tablet Take 40 mg by mouth every evening.    . cephALEXin (KEFLEX) 500 MG capsule Take 500 mg by mouth 3 (three) times daily. For ten days    . doxycycline (VIBRA-TABS) 100 MG tablet Take 100 mg by mouth 2 (two) times daily. For 10 days    . hydrALAZINE (APRESOLINE) 25 MG tablet Take 25 mg by mouth 3 (three) times daily.    . metoprolol tartrate (LOPRESSOR) 25 MG tablet Take 25 mg by mouth 2 (two) times daily.    Marland Kitchen PROAIR HFA 108 (90 Base) MCG/ACT inhaler Inhale 2 puffs into  the lungs every 4 (four) hours as needed for shortness of breath.      Results for orders placed or performed during the hospital encounter of 06/23/20 (from the past 48 hour(s))  CBC with Differential/Platelet     Status: Abnormal   Collection Time: 06/23/20  1:10 AM  Result Value Ref Range   WBC 5.5 4.0 - 10.5 K/uL   RBC 5.25 4.22 - 5.81 MIL/uL   Hemoglobin 14.2 13.0 - 17.0 g/dL   HCT 29.5 39 - 52 %   MCV 91.0 80.0 - 100.0 fL   MCH 27.0 26.0 - 34.0 pg   MCHC 29.7 (L) 30.0 - 36.0 g/dL   RDW 28.4 13.2 - 44.0 %   Platelets 221 150 - 400 K/uL   nRBC 0.0 0.0 - 0.2 %   Neutrophils Relative % 42 %   Neutro Abs 2.3 1.7 - 7.7 K/uL   Lymphocytes Relative 49 %   Lymphs Abs 2.7 0.7 - 4.0 K/uL   Monocytes Relative 4 %   Monocytes Absolute 0.2 0 - 1 K/uL   Eosinophils Relative 4 %   Eosinophils Absolute 0.2 0 - 0 K/uL   Basophils Relative 1 %   Basophils Absolute 0.0 0 - 0 K/uL   Immature Granulocytes 0 %   Abs Immature Granulocytes 0.02 0.00 - 0.07 K/uL  Comment: Performed at [facility], [address], [place], [place] [address]  Comprehensive metabolic panel     Status: Abnormal   Collection Time: 06/23/20  1:10 AM  Result Value Ref Range   Sodium 145 135 - 145 mmol/L   Potassium 3.6 3.5 - 5.1 mmol/L   Chloride 111 98 - 111 mmol/L   CO2 24 22 - 32 mmol/L   Glucose, Bld 215 (H) 70 - 99 mg/dL    Comment: Glucose reference range applies only to samples taken after fasting for at least 8 hours.   BUN 19 8 - 23 mg/dL   Creatinine, Ser 6.04 (H) 0.61 - 1.24 mg/dL   Calcium 9.3 8.9 - 54.0 mg/dL   Total Protein 7.4 6.5 - 8.1 g/dL   Albumin 3.6 3.5 - 5.0 g/dL   AST 59 (H) 15 - 41 U/L   ALT 59 (H) 0 - 44 U/L   Alkaline Phosphatase 156 (H) 38 - 126 U/L   Total Bilirubin 0.6 0.3 - 1.2 mg/dL   GFR calc non Af Amer 30 (L) >60 mL/min   GFR calc Af Amer 34 (L) >60 mL/min   Anion gap 10 5 - 15    Comment: Performed at [facility], [address], [place], [place] [address]   Troponin I (High Sensitivity)     Status: Abnormal   Collection Time: 06/23/20  1:10 AM  Result Value Ref Range   Troponin I (High Sensitivity) 56 (H) <18 ng/L    Comment: (NOTE) Elevated high sensitivity troponin I (hsTnI) values and significant  changes across serial measurements may suggest ACS but many other  chronic and acute conditions are known to elevate hsTnI results.  Refer to the "Links" section for chest pain algorithms and additional  guidance. Performed at [facility], [address] [place], [place] [address]   SARS Coronavirus 2 by RT PCR (hospital order, performed in [facility] hospital lab) Nasopharyngeal Nasopharyngeal Swab     Status: None   Collection Time: 06/23/20  1:18 AM   Specimen: Nasopharyngeal Swab  Result Value Ref Range   SARS Coronavirus 2 NEGATIVE NEGATIVE    Comment: (NOTE) SARS-CoV-2 target nucleic acids are NOT DETECTED.  The SARS-CoV-2 RNA is generally detectable in upper and lower respiratory specimens during the acute phase of infection. The lowest concentration of SARS-CoV-2 viral copies this assay can detect is 250 copies / mL. A negative result does not preclude SARS-CoV-2 infection and should not be used as the sole basis for treatment or other patient management decisions.  A negative result may occur with improper specimen collection / handling, submission of specimen other than nasopharyngeal swab, presence of viral mutation(s) within the areas targeted by this assay, and inadequate number of viral copies (<250 copies / mL). A negative result must be combined with clinical observations, patient history, and epidemiological information.  Fact Sheet for Patients:   BoilerBrush.com.cy  Fact Sheet for Healthcare Providers: https://pope.com/  This test is not yet approved or  cleared by the [place] FDA and has been authorized for detection and/or diagnosis of SARS-CoV-2  by FDA under an Emergency Use Authorization (EUA).  This EUA will remain in effect (meaning this test can be used) for the duration of the COVID-19 declaration under Section 564(b)(1) of the Act, 21 U.S.C. section 360bbb-3(b)(1), unless the authorization is terminated or revoked sooner.  Performed at [facility], [address], [place], [place] [address]  Brain natriuretic peptide     Status: Abnormal   Collection Time: 06/23/20  1:18 AM  Result Value Ref Range   B Natriuretic Peptide 829.3 (H) 0.0 - 100.0 pg/mL    Comment: Performed at [facility], [address] [place], [place] [address]  I-Stat arterial blood gas, ED     Status: Abnormal   Collection Time: 06/23/20  1:37 AM  Result Value Ref Range   pH, Arterial 7.101 (LL) 7.35 - 7.45   pCO2 arterial 79.5 (HH) 32 - 48 mmHg   pO2, Arterial 119 (H) 83 - 108 mmHg   Bicarbonate 24.8 20.0 - 28.0 mmol/L   TCO2 27 22 - 32 mmol/L   O2 Saturation 96.0 %   Acid-base deficit 7.0 (H) 0.0 - 2.0 mmol/L   Sodium 143 135 - 145 mmol/L   Potassium 4.2 3.5 - 5.1 mmol/L   Calcium, Ion 1.30 1.15 - 1.40 mmol/L   HCT 43.0 39 - 52 %   Hemoglobin 14.6 13.0 - 17.0 g/dL   Patient temperature 40.9 F    Collection site Radial    Drawn by RT    Sample type ARTERIAL   HIV Antibody (routine testing w rflx)     Status: None   Collection Time: 06/23/20  4:24 AM  Result Value Ref Range   HIV Screen 4th Generation wRfx Non Reactive Non Reactive    Comment: Performed at [facility], [address] [place], [place] [address]  CBC with Differential/Platelet     Status: Abnormal   Collection Time: 06/23/20  4:24 AM  Result Value Ref Range   WBC 8.4 4.0 - 10.5 K/uL   RBC 4.93 4.22 - 5.81 MIL/uL   Hemoglobin 13.5 13.0 - 17.0 g/dL   HCT 47.8 39 - 52 %   MCV 91.9 80.0 - 100.0 fL   MCH 27.4 26.0 - 34.0 pg   MCHC 29.8 (L) 30.0 - 36.0 g/dL   RDW 29.5 62.1 - 30.8 %   Platelets 170 150 - 400 K/uL   nRBC 0.0 0.0 - 0.2 %   Neutrophils  Relative % 86 %   Neutro Abs 7.2 1.7 - 7.7 K/uL   Lymphocytes Relative 9 %   Lymphs Abs 0.7 0.7 - 4.0 K/uL   Monocytes Relative 4 %   Monocytes Absolute 0.3 0 - 1 K/uL   Eosinophils Relative 0 %   Eosinophils Absolute 0.0 0 - 0 K/uL   Basophils Relative 0 %   Basophils Absolute 0.0 0 - 0 K/uL   Immature Granulocytes 1 %   Abs Immature Granulocytes 0.04 0.00 - 0.07 K/uL    Comment: Performed at [facility], [address], [place], [place] [address]  Troponin I (High Sensitivity)     Status: Abnormal   Collection Time: 06/23/20  4:24 AM  Result Value Ref Range   Troponin I (High Sensitivity) 178 (HH) <18 ng/L    Comment: CRITICAL RESULT CALLED TO, READ BACK BY AND VERIFIED WITH: RN A PERRY @0611  06/23/20 BY S GEZAHEGN (NOTE) Elevated high sensitivity troponin I (hsTnI) values and significant  changes across serial measurements may suggest ACS but many other  chronic and acute conditions are known to elevate hsTnI results.  Refer to the Links section for chest pain algorithms and additional  guidance. Performed at [facility], [address], [place], [place]   Triglycerides     Status: None   Collection Time: 06/23/20  4:24 AM  Result Value Ref Range   Triglycerides 72 <150 mg/dL    Comment: Performed at [facility], [address], [place], [place] [address]  Basic metabolic panel     Status: Abnormal   Collection Time: 06/23/20  4:24 AM  Result Value Ref Range   Sodium 143 135 - 145 mmol/L   Potassium 4.4 3.5 - 5.1 mmol/L   Chloride 109 98 - 111 mmol/L   CO2 21 (L) 22 - 32 mmol/L   Glucose, Bld 124 (H) 70 - 99 mg/dL    Comment: Glucose reference range applies only to samples taken after fasting for at least 8 hours.   BUN 20 8 - 23 mg/dL   Creatinine, Ser 6.04 (H) 0.61 - 1.24 mg/dL   Calcium 9.4 8.9 - 54.0 mg/dL   GFR calc non Af Amer 29 (L) >60 mL/min   GFR calc Af Amer 34 (L) >60 mL/min   Anion gap 13 5 - 15    Comment: Performed at  [facility], [address], [place], [place] [address]  Hemoglobin A1c     Status: Abnormal   Collection Time: 06/23/20  4:24 AM  Result Value Ref Range   Hgb A1c MFr Bld 6.7 (H) 4.8 - 5.6 %    Comment: (NOTE) Pre diabetes:          5.7%-6.4%  Diabetes:              >6.4%  Glycemic control for   <7.0% adults with diabetes    Mean Plasma Glucose 145.59 mg/dL    Comment: Performed at [facility], [address], [place], [place] [address]  Rapid urine drug screen (hospital performed)     Status: Abnormal   Collection Time: 06/23/20  5:18 AM  Result Value Ref Range   Opiates NONE DETECTED NONE DETECTED   Cocaine POSITIVE (A) NONE DETECTED   Benzodiazepines POSITIVE (A) NONE DETECTED   Amphetamines NONE DETECTED NONE DETECTED   Tetrahydrocannabinol NONE DETECTED NONE DETECTED   Barbiturates NONE DETECTED NONE DETECTED    Comment: (NOTE) DRUG SCREEN FOR MEDICAL PURPOSES ONLY.  IF CONFIRMATION IS NEEDED FOR ANY PURPOSE, NOTIFY LAB WITHIN 5 DAYS.  LOWEST DETECTABLE LIMITS FOR URINE DRUG SCREEN Drug Class                     Cutoff (ng/mL) Amphetamine and metabolites    1000 Barbiturate and metabolites    200 Benzodiazepine                 200 Tricyclics and metabolites     300 Opiates and metabolites        300 Cocaine and metabolites        300 THC                            50 Performed at [facility], [address] [place], [place] [address]   Urinalysis, Routine w reflex microscopic     Status: Abnormal   Collection Time: 06/23/20  5:18 AM  Result Value Ref Range   Color, Urine YELLOW YELLOW   APPearance HAZY (A) CLEAR   Specific Gravity, Urine 1.010 1.005 - 1.030   pH 5.0 5.0 - 8.0   Glucose, UA NEGATIVE NEGATIVE mg/dL   Hgb urine dipstick SMALL (A) NEGATIVE   Bilirubin Urine NEGATIVE NEGATIVE   Ketones, ur NEGATIVE NEGATIVE mg/dL  Protein, ur 100 (A) NEGATIVE mg/dL   Nitrite NEGATIVE NEGATIVE   Leukocytes,Ua NEGATIVE NEGATIVE   RBC /  HPF 6-10 0 - 5 RBC/hpf   WBC, UA 6-10 0 - 5 WBC/hpf   Bacteria, UA FEW (A) NONE SEEN   Squamous Epithelial / LPF 0-5 0 - 5   Mucus PRESENT    Hyaline Casts, UA PRESENT     Comment: Performed at [facility], [address] [place], [place] [address]  Magnesium     Status: None   Collection Time: 06/23/20 10:01 AM  Result Value Ref Range   Magnesium 2.0 1.7 - 2.4 mg/dL    Comment: Performed at [facility], [address] [place], [place] [address]  Phosphorus     Status: None   Collection Time: 06/23/20 10:01 AM  Result Value Ref Range   Phosphorus 3.5 2.5 - 4.6 mg/dL    Comment: Performed at [facility], [address], [place], [place] [address]  Troponin I (High Sensitivity)     Status: Abnormal   Collection Time: 06/23/20 10:01 AM  Result Value Ref Range   Troponin I (High Sensitivity) 1,305 (HH) <18 ng/L    Comment: CRITICAL VALUE NOTED.  VALUE IS CONSISTENT WITH PREVIOUSLY REPORTED AND CALLED VALUE. (NOTE) Elevated high sensitivity troponin I (hsTnI) values and significant  changes across serial measurements may suggest ACS but many other  chronic and acute conditions are known to elevate hsTnI results.  Refer to the Links section for chest pain algorithms and additional  guidance. Performed at [facility], [address] [place], [place] [address]   CBG monitoring, ED     Status: Abnormal   Collection Time: 06/23/20 12:02 PM  Result Value Ref Range   Glucose-Capillary 126 (H) 70 - 99 mg/dL    Comment: Glucose reference range applies only to samples taken after fasting for at least 8 hours.  Heparin level (unfractionated)     Status: Abnormal   Collection Time: 06/23/20  1:33 PM  Result Value Ref Range   Heparin Unfractionated <0.10 (L) 0.30 - 0.70 IU/mL    Comment: (NOTE) If heparin results are below expected values, and patient dosage has  been confirmed, suggest follow up testing of antithrombin III levels. Performed at [facility], [address], [place], [place] [address]   CBG monitoring, ED     Status: Abnormal   Collection Time: 06/23/20  5:44 PM  Result Value Ref Range   Glucose-Capillary 101 (H) 70 - 99 mg/dL    Comment: Glucose reference range applies only to samples taken after fasting for at least 8 hours.  Magnesium     Status: None   Collection Time: 06/23/20  9:00 PM  Result Value Ref Range   Magnesium 2.0 1.7 - 2.4 mg/dL    Comment: Performed at [facility], [address], [place], [place] [address]  Phosphorus     Status: None   Collection Time: 06/23/20  9:00 PM  Result Value Ref Range   Phosphorus 4.6 2.5 - 4.6 mg/dL    Comment: Performed at [facility], [address] [place], [place] [address]  Troponin I (High Sensitivity)     Status: Abnormal   Collection Time: 06/23/20  9:00 PM  Result Value Ref Range   Troponin I (High Sensitivity) 2,971 (HH) <18 ng/L    Comment: DELTA CHECK NOTED CRITICAL RESULT CALLED TO, READ BACK BY AND  VERIFIED WITH: Eliot Ford RN [id number] K FORSYTH (NOTE) Elevated high sensitivity troponin I (hsTnI) values and significant  changes across serial measurements may suggest ACS but many other  chronic and acute conditions are known to elevate hsTnI results.  Refer to the Links section for chest pain algorithms and additional  guidance. Performed at [facility], [address] [place], [place] [address]   Glucose, capillary     Status: Abnormal   Collection Time: 06/23/20  9:35 PM  Result Value Ref Range   Glucose-Capillary 174 (H) 70 - 99 mg/dL    Comment: Glucose reference range applies only to samples taken after fasting for at least 8 hours.  MRSA PCR Screening     Status: None   Collection Time: 06/23/20  9:50 PM   Specimen: Nasal Mucosa; Nasopharyngeal  Result Value Ref Range   MRSA by PCR NEGATIVE NEGATIVE    Comment:        The GeneXpert MRSA Assay (FDA approved for NASAL specimens only), is one component of  a comprehensive MRSA colonization surveillance program. It is not intended to diagnose MRSA infection nor to guide or monitor treatment for MRSA infections. Performed at [facility], [address], [place], [place] [address]   I-STAT 7, (LYTES, BLD GAS, ICA, H+H)     Status: Abnormal   Collection Time: 06/23/20 10:09 PM  Result Value Ref Range   pH, Arterial 7.334 (L) 7.35 - 7.45   pCO2 arterial 39.4 32 - 48 mmHg   pO2, Arterial 63 (L) 83 - 108 mmHg   Bicarbonate 20.9 20.0 - 28.0 mmol/L   TCO2 22 22 - 32 mmol/L   O2 Saturation 90.0 %   Acid-base deficit 4.0 (H) 0.0 - 2.0 mmol/L   Sodium 142 135 - 145 mmol/L   Potassium 4.4 3.5 - 5.1 mmol/L   Calcium, Ion 1.25 1.15 - 1.40 mmol/L   HCT 35.0 (L) 39 - 52 %   Hemoglobin 11.9 (L) 13.0 - 17.0 g/dL   Patient temperature 86.7 F    Collection site Radial    Drawn by HIDE    Sample type ARTERIAL   Glucose, capillary     Status: Abnormal   Collection Time: 06/23/20 11:14 PM  Result Value Ref Range   Glucose-Capillary 175 (H) 70 - 99 mg/dL    Comment: Glucose reference range applies only to samples taken after fasting for at least 8 hours.  Heparin level (unfractionated)     Status: None   Collection Time: 06/24/20 12:40 AM  Result Value Ref Range   Heparin Unfractionated 0.58 0.30 - 0.70 IU/mL    Comment: (NOTE) If heparin results are below expected values, and patient dosage has  been confirmed, suggest follow up testing of antithrombin III levels. Performed at [facility], [address] [place], [place] [address]   CBC     Status: Abnormal   Collection Time: 06/24/20 12:40 AM  Result Value Ref Range   WBC 7.8 4.0 - 10.5 K/uL   RBC 4.38 4.22 - 5.81 MIL/uL   Hemoglobin 11.8 (L) 13.0 - 17.0 g/dL   HCT 47.0 39 - 52 %   MCV 89.5 80.0 - 100.0 fL   MCH 26.9 26.0 - 34.0 pg   MCHC 30.1 30.0 - 36.0 g/dL   RDW 96.2 83.6 - 62.9 %   Platelets 175 150 - 400 K/uL   nRBC 0.0 0.0 - 0.2 %    Comment: Performed at  [facility], [address] [place], [place] [address]  Basic metabolic panel     Status: Abnormal   Collection Time: 06/24/20 12:40 AM  Result Value Ref Range   Sodium 141 135 - 145 mmol/L   Potassium 5.1 3.5 - 5.1 mmol/L   Chloride 109 98 - 111 mmol/L   CO2 19 (L) 22 - 32 mmol/L   Glucose, Bld 153 (H) 70 - 99 mg/dL    Comment: Glucose reference range applies only to samples taken after fasting for at least 8 hours.   BUN 35 (H) 8 - 23 mg/dL   Creatinine, Ser 0.35 (H) 0.61 - 1.24 mg/dL   Calcium 9.2 8.9 - 00.9 mg/dL   GFR calc non Af Amer 23 (L) >60 mL/min   GFR calc Af Amer 27 (L) >60 mL/min   Anion gap 13 5 - 15    Comment: Performed at [facility], [address] [place], [place] [address]  Magnesium     Status: None   Collection Time: 06/24/20 12:40 AM  Result Value Ref Range   Magnesium 2.1 1.7 - 2.4 mg/dL    Comment: Performed at [facility], [address] [place], [place] [address]  Phosphorus     Status: Abnormal   Collection Time: 06/24/20 12:40 AM  Result Value Ref Range   Phosphorus 4.9 (H) 2.5 - 4.6 mg/dL    Comment: Performed at [facility], [address] [place], [place] [address]  Triglycerides     Status: None   Collection Time: 06/24/20 12:40 AM  Result Value Ref Range   Triglycerides 92 <150 mg/dL    Comment: Performed at [facility], [address], [place], [place] [address]  Glucose, capillary     Status: Abnormal   Collection Time: 06/24/20  3:18 AM  Result Value Ref Range   Glucose-Capillary 143 (H) 70 - 99 mg/dL    Comment: Glucose reference range applies only to samples taken after fasting for at least 8 hours.  Glucose, capillary     Status: Abnormal   Collection Time: 06/24/20  7:32 AM  Result Value Ref Range   Glucose-Capillary 139 (H) 70 - 99 mg/dL    Comment: Glucose reference range applies only to samples taken after fasting for at least 8 hours.  Heparin level (unfractionated)     Status: Abnormal    Collection Time: 06/24/20  8:03 AM  Result Value Ref Range   Heparin Unfractionated 0.79 (H) 0.30 - 0.70 IU/mL    Comment: (NOTE) If heparin results are below expected values, and patient dosage has  been confirmed, suggest follow up testing of antithrombin III levels. Performed at [facility], [address] [place], [place] [address]   Procalcitonin - Baseline     Status: None   Collection Time: 06/24/20  8:03 AM  Result Value Ref Range   Procalcitonin 11.17 ng/mL    Comment:        Interpretation: PCT >= 10 ng/mL: Important systemic inflammatory response, almost exclusively due to severe bacterial sepsis or septic shock. (NOTE)       Sepsis PCT Algorithm           Lower Respiratory Tract                                      Infection PCT Algorithm    ----------------------------     ----------------------------  PCT < 0.25 ng/mL                PCT < 0.10 ng/mL          Strongly encourage             Strongly discourage   discontinuation of antibiotics    initiation of antibiotics    ----------------------------     -----------------------------       PCT 0.25 - 0.50 ng/mL            PCT 0.10 - 0.25 ng/mL               OR       >80% decrease in PCT            Discourage initiation of                                            antibiotics      Encourage discontinuation           of antibiotics    ----------------------------     -----------------------------         PCT >= 0.50 ng/mL              PCT 0.26 - 0.50 ng/mL                AND       <80% decrease in PCT             Encourage initiation of                                             antibiotics       Encourage continuation           of antibiotics    ----------------------------     -----------------------------        PCT >= 0.50 ng/mL                  PCT > 0.50 ng/mL               AND         increase in PCT                  Strongly encourage                                      initiation of  antibiotics    Strongly encourage escalation           of antibiotics                                     -----------------------------                                           PCT <= 0.25 ng/mL  OR                                        > 80% decrease in PCT                                      Discontinue / Do not initiate                                             antibiotics  Performed at [facility], [address], [place], [place] [address]   C-reactive protein     Status: None   Collection Time: 06/24/20  8:03 AM  Result Value Ref Range   CRP 0.8 <1.0 mg/dL    Comment: Performed at [facility], [address], [place], [place] [address]  Sedimentation rate     Status: None   Collection Time: 06/24/20  8:03 AM  Result Value Ref Range   Sed Rate 15 0 - 16 mm/hr    Comment: Performed at [facility], [address], [place], [place] [address]  I-STAT 7, (LYTES, BLD GAS, ICA, H+H)     Status: Abnormal   Collection Time: 06/24/20  8:38 AM  Result Value Ref Range   pH, Arterial 7.323 (L) 7.35 - 7.45   pCO2 arterial 43.8 32 - 48 mmHg   pO2, Arterial 90 83 - 108 mmHg   Bicarbonate 22.7 20.0 - 28.0 mmol/L   TCO2 24 22 - 32 mmol/L   O2 Saturation 96.0 %   Acid-base deficit 3.0 (H) 0.0 - 2.0 mmol/L   Sodium 141 135 - 145 mmol/L   Potassium 4.4 3.5 - 5.1 mmol/L   Calcium, Ion 1.25 1.15 - 1.40 mmol/L   HCT 33.0 (L) 39 - 52 %   Hemoglobin 11.2 (L) 13.0 - 17.0 g/dL   Patient temperature 91.4 F    Collection site Radial    Drawn by RT    Sample type ARTERIAL   Glucose, capillary     Status: Abnormal   Collection Time: 06/24/20 11:09 AM  Result Value Ref Range   Glucose-Capillary 123 (H) 70 - 99 mg/dL    Comment: Glucose reference range applies only to samples taken after fasting for at least 8 hours.   DG Chest Port 1 View  Result Date: 06/24/2020 CLINICAL DATA:  Respiratory failure. EXAM: PORTABLE CHEST 1  VIEW COMPARISON:  06/23/2020 and prior studies FINDINGS: Endotracheal tube is noted with tip 6.6 cm above the carina, consider 2-3 cm advancement. An NG tube is present with tip overlying the proximal stomach. Decreased bilateral airspace opacities/edema noted No pneumothorax or large pleural effusion identified. No acute bony abnormalities are present. IMPRESSION: 1. Endotracheal tube with tip 6.6 cm above the carina, consider 2-3 cm advancement. 2. Decreased bilateral airspace opacities/edema. Electronically Signed   By: Harmon Pier M.D.   On: 06/24/2020 06:58   DG Chest Port 1 View  Result Date: 06/23/2020 CLINICAL DATA:  Short of breath, intubated EXAM: PORTABLE CHEST 1 VIEW COMPARISON:  12/09/2016 FINDINGS: 2 frontal views of the chest demonstrate endotracheal tube overlying tracheal air column tip just below thoracic inlet. The cardiac silhouette is enlarged.  There is bilateral airspace disease, right greater than left. No effusion or pneumothorax. No acute bony abnormalities. IMPRESSION: 1. Bilateral ground-glass airspace disease, right greater than left. Findings could reflect asymmetric edema or multifocal pneumonia. 2. No complication after intubation. Electronically Signed   By: Sharlet Salina M.D.   On: 06/23/2020 01:12   DG Abd Portable 1 View  Result Date: 06/23/2020 CLINICAL DATA:  verify placement OG tube EXAM: PORTABLE ABDOMEN - 1 VIEW. Left lateral abdomen collimated off view. COMPARISON:  None FINDINGS: Enteric tube noted with tip coursing below the diaphragm likely just distal to the gastroesophageal junction. Enteric side port noted overlying the expected region of the distal esophagus. The bowel gas pattern is normal. No radio-opaque calculi or other significant radiographic abnormality are seen. Visualized right lung base is unremarkable. Visualized osseous structures are unremarkable. IMPRESSION: Enteric tube with tip coursing below the diaphragm likely just distal to the  gastroesophageal junction. Enteric side port overlying the expected region of the distal esophagus. If side-port desired within the gastric lumen, consider advancing by at least 10 cm. Normal bowel gas pattern. These results will be called to the ordering clinician or representative by the Radiologist Assistant, and communication documented in the PACS or [employer]. Electronically Signed   By: Tish Frederickson M.D.   On: 06/23/2020 13:16    Review Of Systems Constitutional: No fever, chills, chronic weight gain. Eyes: No vision change, wears glasses. No discharge or pain. Ears: No hearing loss, No tinnitus. Respiratory: No asthma, positive COPD, pneumonias. Positive shortness of breath. No hemoptysis. Cardiovascular: Positive chest pain, palpitation, leg edema. Gastrointestinal: No nausea, vomiting, diarrhea, constipation. No GI bleed. No hepatitis. Genitourinary: No dysuria, hematuria, kidney stone. No incontinance. Neurological: No headache, stroke, seizures.  Psychiatry: No psych facility admission for anxiety, depression, suicide. No detox. Skin: No rash. Musculoskeletal: Positive joint pain, no fibromyalgia. Positive neck pain, back pain. Lymphadenopathy: No lymphadenopathy. Hematology: No anemia or easy bruising.   Blood pressure 134/73, pulse 87, temperature 98.4 F (36.9 C), resp. rate 10, height 5\' 10"  (1.778 m), weight 94.6 kg, SpO2 97 %. Body mass index is 29.92 kg/m. General appearance: alert, cooperative, appears stated age and mild distress Head: Normocephalic, atraumatic. Eyes: Brown eyes, pink conjunctiva, corneas clear. PERRL, EOM's intact. Neck: No adenopathy, no carotid bruit, no JVD, supple, symmetrical, trachea midline and thyroid not enlarged. Resp: Clearing to auscultation bilaterally. Cardio: Regular rate and rhythm, S1, S2 normal, II/VI systolic murmur, no click, rub or gallop GI: Soft, non-tender; bowel sounds normal; no organomegaly. Extremities: No  edema, cyanosis or clubbing. Skin: Warm and dry.  Neurologic: Alert and oriented X 3, normal strength.   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 Patient prefers medical therapy for now and wait for renal function to improve. May need NM myocardial perfusion stress test if ischemia guided treatment is preferred. Resume amlodipine and hydralazine for BP control and hold metoprolol.  Time spent: Review of old records, Lab, x-rays, EKG, other cardiac tests, examination, discussion with patient, nurse, wife and referring physician over 70 minutes.  Ricki Rodriguez, MD  06/24/2020, 2:09 PM

## 2020-06-24 NOTE — Progress Notes (Signed)
ANTICOAGULATION CONSULT NOTE  Pharmacy Consult for IV heparin Indication: chest pain/ACS  No Known Allergies  Patient Measurements: Height: 5\' 10"  (177.8 cm) Weight: 94.4 kg (208 lb 1.8 oz) IBW/kg (Calculated) : 73 Heparin Dosing Weight: 95.2 kg  Vital Signs: Temp: 99.1 F (37.3 C) (09/03 0100) Temp Source: Bladder (09/02 2030) BP: 112/77 (09/03 0100) Pulse Rate: 70 (09/03 0000)  Labs: Recent Labs    06/23/20 0110 06/23/20 0137 06/23/20 0424 06/23/20 0424 06/23/20 1001 06/23/20 1333 06/23/20 2100 06/23/20 2209 06/24/20 0040  HGB 14.2   < > 13.5   < >  --   --   --  11.9* 11.8*  HCT 47.8   < > 45.3  --   --   --   --  35.0* 39.2  PLT 221  --  170  --   --   --   --   --  175  HEPARINUNFRC  --   --   --   --   --  <0.10*  --   --  0.58  CREATININE 2.25*  --  2.29*  --   --   --   --   --   --   TROPONINIHS 56*   < > 178*  --  1,305*  --  2,971*  --   --    < > = values in this interval not displayed.    Estimated Creatinine Clearance: 37.6 mL/min (A) (by C-G formula based on SCr of 2.29 mg/dL (H)).   Assessment: 64 yo male admitted with respiratory failure, hypertensive emergency.  Pharmacy asked to start IV heparin for suspected ACS.  No known anticoagulants PTA.  Heparin level therapeutic (0.58) on gtt at 1650 units/hr. No bleeding noted.  Goal of Therapy:  Heparin level 0.3-0.7 units/ml Monitor platelets by anticoagulation protocol: Yes   Plan:  Continue heparin at 1650 units/hr. F/u daily heparin level to confirm therapeutic  77, PharmD, BCPS Please see amion for complete clinical pharmacist phone list 06/24/2020 1:38 AM

## 2020-06-24 NOTE — Procedures (Signed)
Extubation Procedure Note  Patient Details:   Name: Phillip Kennedy DOB: 1956-07-20 MRN: [id number]   Airway Documentation:    Vent end date: 06/24/20 Vent end time: 1348   Evaluation  O2 sats: stable throughout Complications: No apparent complications Patient did tolerate procedure well. Bilateral Breath Sounds: Diminished   Pt extubated to 4L [place] per MD order. Pt had positive cuff leak and no stridor noted. Pt able to voice his name.  Guss Bunde 06/24/2020, 1:49 PM

## 2020-06-24 NOTE — Progress Notes (Signed)
Initial Nutrition Assessment  DOCUMENTATION CODES:   Not applicable  INTERVENTION:   Initiate tube feeding via OG tube: Vital AF 1.2 at 65 ml/h (1560 ml per day) Prosource TF 45 ml BID  Provides 1952 kcal (2283 kcal total with propofol), 139 gm protein, 1265 ml free water daily  NUTRITION DIAGNOSIS:   Inadequate oral intake related to inability to eat as evidenced by NPO status.  GOAL:   Patient will meet greater than or equal to 90% of their needs  MONITOR:   Vent status, TF tolerance, Labs  REASON FOR ASSESSMENT:   Ventilator, Consult Enteral/tube feeding initiation and management  ASSESSMENT:   64 yo male admitted with SOB; required emergency intubation. UDS positive for cocaine. PMH includes COPD, HTN, tobacco abuse.   Received MD Consult for TF initiation and management. OG tube in place. COVID negative on admission.  Patient is currently intubated on ventilator support MV: 16.8 L/min Temp (24hrs), Avg:99.2 F (37.3 C), Min:98.6 F (37 C), Max:99.7 F (37.6 C)  Propofol: 12.52 ml/hr providing 331 kcal from lipid  Labs reviewed. Phos 4.9, BUN 35, creat 2.79, triglycerides 92 WDL CBG: 143-139  Medications reviewed and include colace, novolog, miralax, fentanyl, propofol.   No recent weights available for review.  NUTRITION - FOCUSED PHYSICAL EXAM:  unable to complete  Diet Order:   Diet Order            Diet NPO time specified  Diet effective now                 EDUCATION NEEDS:   No education needs have been identified at this time  Skin:  Skin Assessment: Reviewed RN Assessment  Last BM:  no BM documented  Height:   Ht Readings from Last 1 Encounters:  06/23/20 5\' 10"  (1.778 m)     Weight:   Wt Readings from Last 1 Encounters:  06/24/20 94.6 kg    Ideal Body Weight:  75.5 kg  BMI:  Body mass index is 29.92 kg/m.  Estimated Nutritional Needs:   Kcal:  2265  Protein:  130-150 gm  Fluid:  >/= 2.2 L    2266, RD, LDN, CNSC Please refer to Amion for contact information.

## 2020-06-24 NOTE — Progress Notes (Signed)
ANTICOAGULATION CONSULT NOTE  Pharmacy Consult for IV heparin Indication: chest pain/ACS  No Known Allergies  Patient Measurements: Height: 5\' 10"  (177.8 cm) Weight: 94.6 kg (208 lb 8.9 oz) IBW/kg (Calculated) : 73 Heparin Dosing Weight: 95.2 kg  Vital Signs: Temp: 98.2 F (36.8 C) (09/03 1400) BP: 167/113 (09/03 1400) Pulse Rate: 76 (09/03 1400)  Labs: Recent Labs    06/23/20 0110 06/23/20 0137 06/23/20 0424 06/23/20 0424 06/23/20 1001 06/23/20 1333 06/23/20 2100 06/23/20 2209 06/24/20 0040 06/24/20 0803 06/24/20 0838 06/24/20 1535  HGB 14.2   < > 13.5   < >  --    < >  --  11.9* 11.8*  --  11.2*  --   HCT 47.8   < > 45.3   < >  --   --   --  35.0* 39.2  --  33.0*  --   PLT 221  --  170  --   --   --   --   --  175  --   --   --   HEPARINUNFRC  --   --   --   --   --    < >  --   --  0.58 0.79*  --  0.79*  CREATININE 2.25*  --  2.29*  --   --   --   --   --  2.79*  --   --   --   TROPONINIHS 56*   < > 178*  --  1,305*  --  2,971*  --   --   --   --   --    < > = values in this interval not displayed.    Estimated Creatinine Clearance: 30.9 mL/min (A) (by C-G formula based on SCr of 2.79 mg/dL (H)).   Assessment: 64 yo male admitted with respiratory failure and hypertensive emergency.  Pharmacy asked to start IV heparin for suspected ACS. Troponins elevated x3  No known anticoagulants PTA. No signs of bleeding and IV site secure upon exam. HL this morning is 0.79 on 1650units/hr which is supratherapeutic. CBC is stable. Confirmed heparin was running at correct rate and will dose reduce.    HL came back at 0.79 again this PM. We will reduce the rate a little more and recheck a 8hr level tonight.   Goal of Therapy:  Heparin level 0.3-0.7 units/ml Monitor platelets by anticoagulation protocol: Yes   Plan:  Reduce heparin to 1450 units/hr. Check 8 hr HL Monitor for s/sx of bleeding  77, PharmD, BCIDP, AAHIVP, CPP Infectious Disease Pharmacist 06/24/2020  4:25 PM

## 2020-06-25 DIAGNOSIS — I214 Non-ST elevation (NSTEMI) myocardial infarction: Principal | ICD-10-CM

## 2020-06-25 LAB — BASIC METABOLIC PANEL
Anion gap: 10 (ref 5–15)
BUN: 52 mg/dL — ABNORMAL HIGH (ref 8–23)
CO2: 22 mmol/L (ref 22–32)
Calcium: 8.8 mg/dL — ABNORMAL LOW (ref 8.9–10.3)
Chloride: 106 mmol/L (ref 98–111)
Creatinine, Ser: 3.09 mg/dL — ABNORMAL HIGH (ref 0.61–1.24)
GFR calc Af Amer: 23 mL/min — ABNORMAL LOW (ref >60)
GFR calc non Af Amer: 20 mL/min — ABNORMAL LOW (ref >60)
Glucose, Bld: 100 mg/dL — ABNORMAL HIGH (ref 70–99)
Potassium: 3.8 mmol/L (ref 3.5–5.1)
Sodium: 138 mmol/L (ref 135–145)

## 2020-06-25 LAB — CBC
HCT: 37 % — ABNORMAL LOW (ref 39.0–52.0)
Hemoglobin: 11.6 g/dL — ABNORMAL LOW (ref 13.0–17.0)
MCH: 27.8 pg (ref 26.0–34.0)
MCHC: 31.4 g/dL (ref 30.0–36.0)
MCV: 88.7 fL (ref 80.0–100.0)
Platelets: 155 10*3/uL (ref 150–400)
RBC: 4.17 MIL/uL — ABNORMAL LOW (ref 4.22–5.81)
RDW: 13.8 % (ref 11.5–15.5)
WBC: 7.3 10*3/uL (ref 4.0–10.5)
nRBC: 0 % (ref 0.0–0.2)

## 2020-06-25 LAB — GLUCOSE, CAPILLARY
Glucose-Capillary: 102 mg/dL — ABNORMAL HIGH (ref 70–99)
Glucose-Capillary: 112 mg/dL — ABNORMAL HIGH (ref 70–99)
Glucose-Capillary: 112 mg/dL — ABNORMAL HIGH (ref 70–99)
Glucose-Capillary: 130 mg/dL — ABNORMAL HIGH (ref 70–99)
Glucose-Capillary: 68 mg/dL — ABNORMAL LOW (ref 70–99)
Glucose-Capillary: 69 mg/dL — ABNORMAL LOW (ref 70–99)
Glucose-Capillary: 87 mg/dL (ref 70–99)
Glucose-Capillary: 87 mg/dL (ref 70–99)
Glucose-Capillary: 92 mg/dL (ref 70–99)

## 2020-06-25 LAB — TROPONIN I (HIGH SENSITIVITY): Troponin I (High Sensitivity): 1702 ng/L (ref <18)

## 2020-06-25 LAB — TRIGLYCERIDES: Triglycerides: 72 mg/dL (ref <150)

## 2020-06-25 LAB — HEPARIN LEVEL (UNFRACTIONATED): Heparin Unfractionated: 0.46 IU/mL (ref 0.30–0.70)

## 2020-06-25 MED ORDER — DIGOXIN 125 MCG PO TABS
0.2500 mg | ORAL_TABLET | Freq: Every day | ORAL | Status: AC
  Administered 2020-06-25 – 2020-06-26 (×2): 0.25 mg via ORAL
  Filled 2020-06-25: qty 2
  Filled 2020-06-25: qty 1

## 2020-06-25 MED ORDER — CLOPIDOGREL BISULFATE 75 MG PO TABS
75.0000 mg | ORAL_TABLET | Freq: Every day | ORAL | Status: DC
  Administered 2020-06-25 – 2020-06-29 (×5): 75 mg via ORAL
  Filled 2020-06-25 (×5): qty 1

## 2020-06-25 MED ORDER — ASPIRIN 325 MG PO TABS: 325.0000 mg | ORAL_TABLET | Freq: Every day | ORAL | Status: DC

## 2020-06-25 MED ORDER — POLYETHYLENE GLYCOL 3350 17 G PO PACK
17.0000 g | PACK | Freq: Every day | ORAL | Status: DC
  Administered 2020-06-27 – 2020-06-29 (×2): 17 g via ORAL
  Filled 2020-06-25 (×4): qty 1

## 2020-06-25 MED ORDER — ATORVASTATIN CALCIUM 40 MG PO TABS
40.0000 mg | ORAL_TABLET | Freq: Every day | ORAL | Status: DC
  Administered 2020-06-26 – 2020-06-29 (×4): 40 mg via ORAL
  Filled 2020-06-25 (×4): qty 1

## 2020-06-25 MED ORDER — ASPIRIN EC 81 MG PO TBEC
81.0000 mg | DELAYED_RELEASE_TABLET | Freq: Every day | ORAL | Status: DC
  Administered 2020-06-25 – 2020-06-29 (×5): 81 mg via ORAL
  Filled 2020-06-25 (×5): qty 1

## 2020-06-25 MED ORDER — IPRATROPIUM-ALBUTEROL 0.5-2.5 (3) MG/3ML IN SOLN
RESPIRATORY_TRACT | Status: AC
  Administered 2020-06-25: 3 mL
  Filled 2020-06-25: qty 3

## 2020-06-25 MED ORDER — ISOSORBIDE MONONITRATE ER 60 MG PO TB24
60.0000 mg | ORAL_TABLET | Freq: Every day | ORAL | Status: DC
  Administered 2020-06-25 – 2020-06-26 (×2): 60 mg via ORAL
  Filled 2020-06-25 (×2): qty 1

## 2020-06-25 MED ORDER — AMLODIPINE BESYLATE 5 MG PO TABS
5.0000 mg | ORAL_TABLET | Freq: Two times a day (BID) | ORAL | Status: DC
  Administered 2020-06-25 – 2020-06-29 (×8): 5 mg via ORAL
  Filled 2020-06-25 (×8): qty 1

## 2020-06-25 MED ORDER — HYDRALAZINE HCL 50 MG PO TABS
50.0000 mg | ORAL_TABLET | Freq: Three times a day (TID) | ORAL | Status: DC
  Administered 2020-06-25 – 2020-06-29 (×12): 50 mg via ORAL
  Filled 2020-06-25 (×12): qty 1

## 2020-06-25 MED ORDER — IPRATROPIUM-ALBUTEROL 0.5-2.5 (3) MG/3ML IN SOLN
3.0000 mL | Freq: Two times a day (BID) | RESPIRATORY_TRACT | Status: DC
  Administered 2020-06-25 – 2020-06-26 (×2): 3 mL via RESPIRATORY_TRACT
  Filled 2020-06-25 (×2): qty 3

## 2020-06-25 NOTE — Progress Notes (Signed)
Pharmacist Heart Failure Core Measure Documentation  Assessment: Phillip Kennedy has an EF documented as 25-30% on 06/23/20 by ECHO.  Rationale: Heart failure patients with left ventricular systolic dysfunction (LVSD) and an EF < 40% should be prescribed an angiotensin converting enzyme inhibitor (ACEI) or angiotensin receptor blocker (ARB) at discharge unless a contraindication is documented in the medical record.  This patient is not currently on an ACEI or ARB for HF.  This note is being placed in the record in order to provide documentation that a contraindication to the use of these agents is present for this encounter.  ACE Inhibitor or Angiotensin Receptor Blocker is contraindicated (specify all that apply)  []   ACEI allergy AND ARB allergy []   Angioedema []   Moderate or severe aortic stenosis []   Hyperkalemia []   Hypotension []   Renal artery stenosis [x]   Worsening renal function, preexisting renal disease or dysfunction   06/25/2020 2:39 PM

## 2020-06-25 NOTE — Progress Notes (Signed)
Blood  Sugar 69, 2 orange juices given to patient, will recheck BS afterward.

## 2020-06-25 NOTE — Progress Notes (Signed)
NAME:  Phillip Kennedy, MRN:  [id number], DOB:  May 19, 1956, LOS: 2 ADMISSION DATE:  06/23/2020, CONSULTATION DATE: 06/23/2020 REFERRING MD: Dr Bebe Shaggy, CHIEF COMPLAINT: Shortness of breath  Brief History   64 year old male with COPD and hypertension coming in with shortness of breath had to be emergently intubated   History of present illness   64 year old male with history of hypertension, COPD who came into the emergency room with shortness of breath he did not tolerate CPAP on arrival after being used by ~EMS and had to be emergently intubated on arrival to the emergency room.  Patient was severely hypertensive chest x-ray was showing bilateral pulmonary infiltrates worse on the right side he was started on nitroglycerin drip and was sedated.  Obviously patient was intubated and sedated when I came to assess him and was not able to give any history.  Wife was at bedside and she stated that he has been having worsening shortness of breath over the last month but denies any fever chills or rigors.  Patient did not complain to her about any chest pain.  Patient wife stated that he did have a tick bite but she could not find where.  As per wife patient did receive 2 shots of the Covid vaccine. Covid test is pending at the time of dictation.  Past Medical History  -COPD -Hypertension -Tobacco abuse Significant Hospital Events   Intubated 9/2>>  Consults:  Cardiology  Procedures:  Intubated 9/2>9/3  Significant Diagnostic Tests:  CXR 9/3: ET tube 6-7cm above carina. Bilateral airspace opacities R>L.  Micro Data:  MRSA negative Tracheal aspirate 9/3>few gram neg rod, moderate gram positive cocci  Antimicrobials:  Doxycycline 9/3>> Ceftriaxone 9/3>>  Interim history/subjective:  No acute events overnight. Cardiology consulted yesterday. Patient extubated yesterday and doing well.     Objective   Blood pressure (!) 147/115, pulse (!) 57, temperature 98.1 F (36.7 C), temperature source  Oral, resp. rate 15, height 5\' 10"  (1.778 m), weight 94.5 kg, SpO2 99 %.    Vent Mode: PSV;CPAP FiO2 (%):  [40 %] 40 % Set Rate:  [30 bmp] 30 bmp Vt Set:  [580 mL] 580 mL PEEP:  [5 cmH20] 5 cmH20 Pressure Support:  [5 cmH20] 5 cmH20 Plateau Pressure:  [14 cmH20] 14 cmH20   Intake/Output Summary (Last 24 hours) at 06/25/2020 0740 Last data filed at 06/25/2020 0600 Gross per 24 hour  Intake 2178.87 ml  Output 1001 ml  Net 1177.87 ml   Filed Weights   06/23/20 2030 06/24/20 0400 06/25/20 0500  Weight: 94.4 kg 94.6 kg 94.5 kg    Examination: General: No acute distress, resting comfortably in bed HENT: dentures, moist mucous membranes, sclera anicteric Lungs: course breath sounds, no wheezes or ronchi Cardiovascular: rrr, no mumur Abdomen: Soft, non-tender, non-distended, normal active bowel sounds extremities: trace lower extremity edema Neuro: alert and oriented, moves all extremities   Assessment & Plan:   Acute Hypoxemic and Hypercapnic respiratory failure Multifactorial in setting of underlying COPD with acute pulmonary edema in setting of hypertensive emergency. Also concern for community acquired pneumonia in setting of elevated procalcitonin.  - Wean off nasal canula today - Continue ceftriaxone + doxycycline for CAP - Respiratory culture with gram negative rods and gram positive cocci, urine strep pna negative and legionella ag pending  NSETMI  In setting of hypertensive emergency and cocaine use - Continue aspirin and atorvastatin - Continue heparin drip per ACS protocol, pharmacy consulted - Cardiology consulted, awaiting decision to pursue further coronary artery  disease workup via NM myocardial scan vs heart cath  Hypertensive Emergency - Initially required nitro gtt and has been weaned off antihypertensive agents at this time - Continue home amlodipine and hydralazine per cardiology  Acute on Chronic Congestive Heart Failure  In setting of hypertensive  emergency - No diuresis at this time - hold home metoprolol   Acute on chronic kidney Disease In setting of respiratory failure and hypoxemia - Unknown baseline, Cr 2.79 from 2.25 on admission - Proteinuria notable on UA - Renal US shows medical renal disease. No hydronephrosis. Bilateral renal cysts. Left kidney contains echogenic calcification or nodule which may require further evaluation with CT. - Trend renal function  COPD, not in acute exacerbation - Start brovana and budesonide nebs - PRN duonebs  Best practice:  Diet: clears, advance as tolerated to heart healthy diet Pain/Anxiety/Delirium protocol (if indicated): N/A VAP protocol (if indicated): ordered DVT prophylaxis: On heparin drip GI prophylaxis: PPI Glucose control: SSI Mobility: out of bed to chair Code Status: Full code Family Communication: Wife Disposition: transfer to general medical floor  Labs   CBC: Recent Labs  Lab 06/23/20 0110 06/23/20 0137 06/23/20 0424 06/23/20 2209 06/24/20 0040 06/24/20 0838 06/25/20 0303  WBC 5.5  --  8.4  --  7.8  --  7.3  NEUTROABS 2.3  --  7.2  --   --   --   --   HGB 14.2   < > 13.5 11.9* 11.8* 11.2* 11.6*  HCT 47.8   < > 45.3 35.0* 39.2 33.0* 37.0*  MCV 91.0  --  91.9  --  89.5  --  88.7  PLT 221  --  170  --  175  --  155   < > = values in this interval not displayed.    Basic Metabolic Panel: Recent Labs  Lab 06/23/20 0110 06/23/20 0137 06/23/20 0424 06/23/20 1001 06/23/20 2100 06/23/20 2209 06/24/20 0040 06/24/20 0838 06/24/20 1535 06/25/20 0303  NA 145   < > 143  --   --  142 141 141  --  138  K 3.6   < > 4.4  --   --  4.4 5.1 4.4  --  3.8  CL 111  --  109  --   --   --  109  --   --  106  CO2 24  --  21*  --   --   --  19*  --   --  22  GLUCOSE 215*  --  124*  --   --   --  153*  --   --  100*  BUN 19  --  20  --   --   --  35*  --   --  52*  CREATININE 2.25*  --  2.29*  --   --   --  2.79*  --   --  3.09*  CALCIUM 9.3  --  9.4  --   --   --   9.2  --   --  8.8*  MG  --   --   --  2.0 2.0  --  2.1  --  2.2  --   PHOS  --   --   --  3.5 4.6  --  4.9*  --  5.0*  --    < > = values in this interval not displayed.   GFR: Estimated Creatinine Clearance: 27.9 mL/min (A) (by C-G formula based on SCr of 3.09  mg/dL (H)). Recent Labs  Lab 06/23/20 0110 06/23/20 0424 06/24/20 0040 06/24/20 0803 06/25/20 0303  PROCALCITON  --   --   --  11.17  --   WBC 5.5 8.4 7.8  --  7.3    Liver Function Tests: Recent Labs  Lab 06/23/20 0110  AST 59*  ALT 59*  ALKPHOS 156*  BILITOT 0.6  PROT 7.4  ALBUMIN 3.6   No results for input(s): LIPASE, AMYLASE in the last 168 hours. No results for input(s): AMMONIA in the last 168 hours.  ABG    Component Value Date/Time   PHART 7.323 (L) 06/24/2020 0838   PCO2ART 43.8 06/24/2020 0838   PO2ART 90 06/24/2020 0838   HCO3 22.7 06/24/2020 0838   TCO2 24 06/24/2020 0838   ACIDBASEDEF 3.0 (H) 06/24/2020 0838   O2SAT 96.0 06/24/2020 0838     Critical care time: I have spent 33 minutes of critical care time this time was spent at bedside or in the unit this time was exclusive of any billable procedures patient is needing intensive care due to complex medical problems requiring complex medical decisions.    Melody Comas, MD  Pulmonary & Critical Care Office: [phone]   See Amion for Pager Details

## 2020-06-25 NOTE — Consult Note (Addendum)
Ref: [name], MD   Subjective:  Mild respiratory distress and elevated blood pressures continues.  Patient denies chest pain. VS stable. Monitor is sinus rhythm. Troponin I is pending today. Patient aware of CHF and renal dysfunction.  Objective:  Vital Signs in the last 24 hours: Temp:  [97.9 F (36.6 C)-98.4 F (36.9 C)] 98.1 F (36.7 C) (09/04 0700) Pulse Rate:  [55-92] 69 (09/04 1100) Cardiac Rhythm: Normal sinus rhythm (09/04 0800) Resp:  [7-19] 16 (09/04 1100) BP: (118-173)/(76-119) 143/76 (09/04 1100) SpO2:  [90 %-100 %] 95 % (09/04 1100) FiO2 (%):  [40 %] 40 % (09/03 1300) Weight:  [94.5 kg] 94.5 kg (09/04 0500)  Physical Exam: BP Readings from Last 1 Encounters:  06/25/20 (!) 143/76     Wt Readings from Last 1 Encounters:  06/25/20 94.5 kg    Weight change: 0.1 kg Body mass index is 29.89 kg/m. HEENT: [place]/AT, Eyes-Brown, Conjunctiva-Pink, Sclera-Non-icteric Neck: No JVD, No bruit, Trachea midline. Lungs:  Clearing, Bilateral. Cardiac:  Regular rhythm, normal S1 and S2, no S3. II/VI systolic murmur. Abdomen:  Soft, non-tender. BS present. Extremities:  2 + feet and 1 + lower leg edema present. No cyanosis. No clubbing. CNS: AxOx3, Cranial nerves grossly intact, moves all 4 extremities.  Skin: Warm and dry.   Intake/Output from previous day: 09/03 0701 - 09/04 0700 In: 2178.9 [P.O.:750; I.V.:552.8; NG/GT:276; IV Piggyback:600] Out: 1001 [Urine:1001]    Lab Results: BMET    Component Value Date/Time   NA 138 06/25/2020 0303   NA 141 06/24/2020 0838   NA 141 06/24/2020 0040   K 3.8 06/25/2020 0303   K 4.4 06/24/2020 0838   K 5.1 06/24/2020 0040   CL 106 06/25/2020 0303   CL 109 06/24/2020 0040   CL 109 06/23/2020 0424   CO2 22 06/25/2020 0303   CO2 19 (L) 06/24/2020 0040   CO2 21 (L) 06/23/2020 0424   GLUCOSE 100 (H) 06/25/2020 0303   GLUCOSE 153 (H) 06/24/2020 0040   GLUCOSE 124 (H) 06/23/2020 0424   BUN 52 (H) 06/25/2020 0303   BUN 35  (H) 06/24/2020 0040   BUN 20 06/23/2020 0424   CREATININE 3.09 (H) 06/25/2020 0303   CREATININE 2.79 (H) 06/24/2020 0040   CREATININE 2.29 (H) 06/23/2020 0424   CALCIUM 8.8 (L) 06/25/2020 0303   CALCIUM 9.2 06/24/2020 0040   CALCIUM 9.4 06/23/2020 0424   GFRNONAA 20 (L) 06/25/2020 0303   GFRNONAA 23 (L) 06/24/2020 0040   GFRNONAA 29 (L) 06/23/2020 0424   GFRAA 23 (L) 06/25/2020 0303   GFRAA 27 (L) 06/24/2020 0040   GFRAA 34 (L) 06/23/2020 0424   CBC    Component Value Date/Time   WBC 7.3 06/25/2020 0303   RBC 4.17 (L) 06/25/2020 0303   HGB 11.6 (L) 06/25/2020 0303   HCT 37.0 (L) 06/25/2020 0303   PLT 155 06/25/2020 0303   MCV 88.7 06/25/2020 0303   MCH 27.8 06/25/2020 0303   MCHC 31.4 06/25/2020 0303   RDW 13.8 06/25/2020 0303   LYMPHSABS 0.7 06/23/2020 0424   MONOABS 0.3 06/23/2020 0424   EOSABS 0.0 06/23/2020 0424   BASOSABS 0.0 06/23/2020 0424   HEPATIC Function Panel Recent Labs    06/23/20 0110  PROT 7.4   HEMOGLOBIN A1C No components found for: HGA1C,  MPG CARDIAC ENZYMES No results found for: CKTOTAL, CKMB, CKMBINDEX, TROPONINI BNP No results for input(s): PROBNP in the last 8760 hours. TSH No results for input(s): TSH in the last 8760 hours.  CHOLESTEROL No results for input(s): CHOL in the last 8760 hours.  Scheduled Meds: . amLODipine  5 mg Oral BID  . arformoterol  15 mcg Nebulization BID  . [START ON 06/26/2020] aspirin  325 mg Oral Daily  . [START ON 06/26/2020] atorvastatin  40 mg Oral Daily  . budesonide (PULMICORT) nebulizer solution  0.25 mg Nebulization BID  . chlorhexidine gluconate (MEDLINE KIT)  15 mL Mouth Rinse BID  . Chlorhexidine Gluconate Cloth  6 each Topical Q0600  . cloNIDine  0.2 mg Oral QHS  . clopidogrel  75 mg Oral Daily  . digoxin  0.25 mg Oral Daily  . docusate  100 mg Oral BID  . hydrALAZINE  50 mg Oral TID  . insulin aspart  0-9 Units Subcutaneous Q4H  . ipratropium-albuterol  3 mL Nebulization BID  . isosorbide  mononitrate  60 mg Oral Daily  . mouth rinse  15 mL Mouth Rinse BID  . pantoprazole (PROTONIX) IV  40 mg Intravenous QHS  . [START ON 06/26/2020] polyethylene glycol  17 g Oral Daily   Continuous Infusions: . sodium chloride 10 mL/hr at 06/24/20 1500  . cefTRIAXone (ROCEPHIN)  IV Stopped (06/24/20 1141)  . doxycycline (VIBRAMYCIN) IV Stopped (06/25/20 0233)  . fentaNYL infusion INTRAVENOUS Stopped (06/24/20 1325)  . heparin 1,450 Units/hr (06/25/20 1100)  . 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 Add Isosorbide mononitrate and DC NTG drip Add Plavix and change aspirin to 81 mg. Daily. Increase activity. Add digoxin for severe LV dysfunction. Consider milrinone drip if needed. Monitor renal function.   LOS: 2 days   Time spent including chart review, lab review, examination, discussion with patient and nurse : 30 min   [name]  MD  06/25/2020, 11:35 AM

## 2020-06-25 NOTE — Progress Notes (Signed)
ANTICOAGULATION CONSULT NOTE  Pharmacy Consult for IV heparin Indication: chest pain/ACS  No Known Allergies  Patient Measurements: Height: 5\' 10"  (177.8 cm) Weight: 94.6 kg (208 lb 8.9 oz) IBW/kg (Calculated) : 73 Heparin Dosing Weight: 95.2 kg  Vital Signs: Temp: 98.2 F (36.8 C) (09/04 0035) Temp Source: Oral (09/04 0035) BP: 151/109 (09/04 0107) Pulse Rate: 74 (09/04 0107)  Labs: Recent Labs    06/23/20 0110 06/23/20 0137 06/23/20 0424 06/23/20 1001 06/23/20 1333 06/23/20 2100 06/23/20 2209 06/24/20 0040 06/24/20 0040 06/24/20 0803 06/24/20 0838 06/24/20 1535 06/25/20 0303  HGB 14.2   < > 13.5  --   --   --    < > 11.8*   < >  --  11.2*  --  11.6*  HCT 47.8   < > 45.3  --   --   --    < > 39.2  --   --  33.0*  --  37.0*  PLT 221   < > 170  --   --   --   --  175  --   --   --   --  155  HEPARINUNFRC  --   --   --   --    < >  --   --  0.58   < > 0.79*  --  0.79* 0.46  CREATININE 2.25*  --  2.29*  --   --   --   --  2.79*  --   --   --   --   --   TROPONINIHS 56*   < > 178* 1,305*  --  2,971*  --   --   --   --   --   --   --    < > = values in this interval not displayed.    Estimated Creatinine Clearance: 30.9 mL/min (A) (by C-G formula based on SCr of 2.79 mg/dL (H)).   Assessment: 64 yo male admitted with respiratory failure and hypertensive emergency.  Pharmacy asked to start IV heparin for suspected ACS. Troponins elevated x3  No known anticoagulants PTA.  Heparin level therapeutic (0.46) on gtt at 1450 units/hr. No bleeding noted.   Goal of Therapy:  Heparin level 0.3-0.7 units/ml Monitor platelets by anticoagulation protocol: Yes   Plan:  Continue heparin at 1450 units/hr. F/u daily heparin level and CBC  77, PharmD, BCPS Please see amion for complete clinical pharmacist phone list 06/25/2020 3:55 AM

## 2020-06-25 NOTE — Progress Notes (Signed)
Pt received from 78M to [id number]. Oriented to room and call bell. CHG bath complete. CCMD called, VSS. Call bell in reach. Will continue to monitor.  Hazle Nordmann, RN

## 2020-06-26 LAB — BASIC METABOLIC PANEL
Anion gap: 9 (ref 5–15)
BUN: 29 mg/dL — ABNORMAL HIGH (ref 8–23)
CO2: 23 mmol/L (ref 22–32)
Calcium: 9.2 mg/dL (ref 8.9–10.3)
Chloride: 108 mmol/L (ref 98–111)
Creatinine, Ser: 1.87 mg/dL — ABNORMAL HIGH (ref 0.61–1.24)
GFR calc Af Amer: 43 mL/min — ABNORMAL LOW (ref >60)
GFR calc non Af Amer: 37 mL/min — ABNORMAL LOW (ref >60)
Glucose, Bld: 123 mg/dL — ABNORMAL HIGH (ref 70–99)
Potassium: 4.1 mmol/L (ref 3.5–5.1)
Sodium: 140 mmol/L (ref 135–145)

## 2020-06-26 LAB — CBC
HCT: 35.9 % — ABNORMAL LOW (ref 39.0–52.0)
Hemoglobin: 11.7 g/dL — ABNORMAL LOW (ref 13.0–17.0)
MCH: 28.1 pg (ref 26.0–34.0)
MCHC: 32.6 g/dL (ref 30.0–36.0)
MCV: 86.3 fL (ref 80.0–100.0)
Platelets: 152 10*3/uL (ref 150–400)
RBC: 4.16 MIL/uL — ABNORMAL LOW (ref 4.22–5.81)
RDW: 13.6 % (ref 11.5–15.5)
WBC: 6.2 10*3/uL (ref 4.0–10.5)
nRBC: 0 % (ref 0.0–0.2)

## 2020-06-26 LAB — CULTURE, RESPIRATORY W GRAM STAIN

## 2020-06-26 LAB — GLUCOSE, CAPILLARY
Glucose-Capillary: 103 mg/dL — ABNORMAL HIGH (ref 70–99)
Glucose-Capillary: 112 mg/dL — ABNORMAL HIGH (ref 70–99)
Glucose-Capillary: 138 mg/dL — ABNORMAL HIGH (ref 70–99)
Glucose-Capillary: 86 mg/dL (ref 70–99)
Glucose-Capillary: 94 mg/dL (ref 70–99)
Glucose-Capillary: 99 mg/dL (ref 70–99)

## 2020-06-26 LAB — TRIGLYCERIDES: Triglycerides: 109 mg/dL (ref <150)

## 2020-06-26 LAB — HEPARIN LEVEL (UNFRACTIONATED)
Heparin Unfractionated: 0.27 IU/mL — ABNORMAL LOW (ref 0.30–0.70)
Heparin Unfractionated: 0.42 IU/mL (ref 0.30–0.70)

## 2020-06-26 MED ORDER — ISOSORBIDE MONONITRATE ER 60 MG PO TB24
120.0000 mg | ORAL_TABLET | Freq: Every day | ORAL | Status: DC
  Administered 2020-06-27 – 2020-06-29 (×3): 120 mg via ORAL
  Filled 2020-06-26 (×3): qty 2

## 2020-06-26 MED ORDER — HEPARIN BOLUS VIA INFUSION
1400.0000 [IU] | Freq: Once | INTRAVENOUS | Status: AC
  Administered 2020-06-26: 1400 [IU] via INTRAVENOUS
  Filled 2020-06-26: qty 1400

## 2020-06-26 MED ORDER — ACETAMINOPHEN 325 MG PO TABS
650.0000 mg | ORAL_TABLET | Freq: Four times a day (QID) | ORAL | Status: DC | PRN
  Administered 2020-06-26 – 2020-06-28 (×3): 650 mg via ORAL
  Filled 2020-06-26 (×3): qty 2

## 2020-06-26 MED ORDER — PANTOPRAZOLE SODIUM 40 MG PO TBEC
40.0000 mg | DELAYED_RELEASE_TABLET | Freq: Every day | ORAL | Status: DC
  Administered 2020-06-26 – 2020-06-28 (×3): 40 mg via ORAL
  Filled 2020-06-26 (×3): qty 1

## 2020-06-26 MED ORDER — METOPROLOL SUCCINATE ER 50 MG PO TB24
50.0000 mg | ORAL_TABLET | Freq: Every day | ORAL | Status: DC
  Administered 2020-06-26 – 2020-06-29 (×4): 50 mg via ORAL
  Filled 2020-06-26 (×4): qty 1

## 2020-06-26 NOTE — H&P (View-Only) (Signed)
Ref: [name], MD   Subjective:  Awake. No chest pain. Respiratory distress continues. Uncontrolled hypertension. Decreasing leg edema. HS-troponin I trending downward. Renal function apparently improving.  Objective:  Vital Signs in the last 24 hours: Temp:  [97.8 F (36.6 C)-98.4 F (36.9 C)] 97.8 F (36.6 C) (09/05 0831) Pulse Rate:  [43-90] 90 (09/05 0831) Cardiac Rhythm: Normal sinus rhythm (09/04 1900) Resp:  [12-24] 20 (09/05 0831) BP: (143-193)/(76-151) 192/128 (09/05 0831) SpO2:  [89 %-100 %] 100 % (09/05 0831) Weight:  [93.5 kg] 93.5 kg (09/05 0658)  Physical Exam: BP Readings from Last 1 Encounters:  06/26/20 (!) 192/128     Wt Readings from Last 1 Encounters:  06/26/20 93.5 kg    Weight change: -1 kg Body mass index is 29.58 kg/m. HEENT: [place]/AT, Eyes-Brown, Conjunctiva-Pink, Sclera-Non-icteric Neck: No JVD, No bruit, Trachea midline. Lungs:  Clearing, Bilateral. Cardiac:  Regular rhythm, normal S1 and S2, no S3. II/VI systolic murmur. Abdomen:  Soft, non-tender. BS present. Extremities:  1 + edema present. No cyanosis. No clubbing. CNS: AxOx3, Cranial nerves grossly intact, moves all 4 extremities.  Skin: Warm and dry.   Intake/Output from previous day: 09/04 0701 - 09/05 0700 In: 724 [P.O.:240; I.V.:134; IV Piggyback:350] Out: 1050 [Urine:1050]    Lab Results: BMET    Component Value Date/Time   NA 140 06/26/2020 0721   NA 138 06/25/2020 0303   NA 141 06/24/2020 0838   K 4.1 06/26/2020 0721   K 3.8 06/25/2020 0303   K 4.4 06/24/2020 0838   CL 108 06/26/2020 0721   CL 106 06/25/2020 0303   CL 109 06/24/2020 0040   CO2 23 06/26/2020 0721   CO2 22 06/25/2020 0303   CO2 19 (L) 06/24/2020 0040   GLUCOSE 123 (H) 06/26/2020 0721   GLUCOSE 100 (H) 06/25/2020 0303   GLUCOSE 153 (H) 06/24/2020 0040   BUN 29 (H) 06/26/2020 0721   BUN 52 (H) 06/25/2020 0303   BUN 35 (H) 06/24/2020 0040   CREATININE 1.87 (H) 06/26/2020 0721   CREATININE 3.09 (H)  06/25/2020 0303   CREATININE 2.79 (H) 06/24/2020 0040   CALCIUM 9.2 06/26/2020 0721   CALCIUM 8.8 (L) 06/25/2020 0303   CALCIUM 9.2 06/24/2020 0040   GFRNONAA 37 (L) 06/26/2020 0721   GFRNONAA 20 (L) 06/25/2020 0303   GFRNONAA 23 (L) 06/24/2020 0040   GFRAA 43 (L) 06/26/2020 0721   GFRAA 23 (L) 06/25/2020 0303   GFRAA 27 (L) 06/24/2020 0040   CBC    Component Value Date/Time   WBC 6.2 06/26/2020 0721   RBC 4.16 (L) 06/26/2020 0721   HGB 11.7 (L) 06/26/2020 0721   HCT 35.9 (L) 06/26/2020 0721   PLT 152 06/26/2020 0721   MCV 86.3 06/26/2020 0721   MCH 28.1 06/26/2020 0721   MCHC 32.6 06/26/2020 0721   RDW 13.6 06/26/2020 0721   LYMPHSABS 0.7 06/23/2020 0424   MONOABS 0.3 06/23/2020 0424   EOSABS 0.0 06/23/2020 0424   BASOSABS 0.0 06/23/2020 0424   HEPATIC Function Panel Recent Labs    06/23/20 0110  PROT 7.4   HEMOGLOBIN A1C No components found for: HGA1C,  MPG CARDIAC ENZYMES No results found for: CKTOTAL, CKMB, CKMBINDEX, TROPONINI BNP No results for input(s): PROBNP in the last 8760 hours. TSH No results for input(s): TSH in the last 8760 hours. CHOLESTEROL No results for input(s): CHOL in the last 8760 hours.  Scheduled Meds: . amLODipine  5 mg Oral BID  . arformoterol  15 mcg  Nebulization BID  . aspirin EC  81 mg Oral Daily  . atorvastatin  40 mg Oral Daily  . budesonide (PULMICORT) nebulizer solution  0.25 mg Nebulization BID  . chlorhexidine gluconate (MEDLINE KIT)  15 mL Mouth Rinse BID  . Chlorhexidine Gluconate Cloth  6 each Topical Q0600  . cloNIDine  0.2 mg Oral QHS  . clopidogrel  75 mg Oral Daily  . docusate  100 mg Oral BID  . hydrALAZINE  50 mg Oral TID  . insulin aspart  0-9 Units Subcutaneous Q4H  . ipratropium-albuterol  3 mL Nebulization BID  . [START ON 06/27/2020] isosorbide mononitrate  120 mg Oral Daily  . mouth rinse  15 mL Mouth Rinse BID  . metoprolol succinate  50 mg Oral Daily  . pantoprazole  40 mg Oral QHS  . polyethylene  glycol  17 g Oral Daily   Continuous Infusions: . sodium chloride 10 mL/hr at 06/24/20 1500  . cefTRIAXone (ROCEPHIN)  IV 2 g (06/26/20 1015)  . doxycycline (VIBRAMYCIN) IV 100 mg (06/26/20 1008)  . fentaNYL infusion INTRAVENOUS Stopped (06/24/20 1325)  . heparin 1,600 Units/hr (06/26/20 1003)  . propofol (DIPRIVAN) infusion Stopped (06/24/20 1325)   PRN Meds:.sodium chloride, albuterol, docusate sodium, fentaNYL, fentaNYL (SUBLIMAZE) injection, fentaNYL (SUBLIMAZE) injection, midazolam, midazolam, polyethylene glycol  Assessment/Plan: Acute NSTEMI with cocaine abuise Acute pulmonary edema Acute respiratory failure Acute on chronic systolic left heart failure, HFrEF CAD COPD Uncontrolled HTN Type 2 DM Acute on chronic renal failure, improving Obesity  Add Toprol XL with vasodilators.   LOS: 3 days   Time spent including chart review, lab review, examination, discussion with patient : 30 min   [name]  MD  06/26/2020, 10:29 AM

## 2020-06-26 NOTE — Progress Notes (Signed)
PROGRESS NOTE  Tel Hevia EBX:[id number] DOB: 10-22-1956 DOA: 06/23/2020 PCP: [name], MD  HPI/Recap of past 40 hours: 64 year old male with history of hypertension, COPD who came into the emergency room with shortness of breath he did not tolerate CPAP on arrival after being used by ~EMS and had to be emergently intubated on arrival to the emergency room.  Patient was severely hypertensive chest x-ray was showing bilateral pulmonary infiltrates worse on the right side he was started on nitroglycerin drip and was sedated.  Obviously patient was intubated and sedated when I came to assess him and was not able to give any history.  Wife was at bedside and she stated that he has been having worsening shortness of breath over the last month but denies any fever chills or rigors.  Patient did not complain to her about any chest pain.  Patient wife stated that he did have a tick bite but she could not find where.  As per wife patient did receive 2 shots of the Covid vaccine. Covid test is pending at the time of dictation.  06/26/20: Seen and seen and examined, reports persistent dyspnea at rest.  Seen by cardiology.  Appreciate assistance.   Assessment/Plan: Active Problems:   Acute pulmonary edema (HCC)  Resolved acute Hypoxemic and Hypercapnic respiratory failure Multifactorial in setting of underlying COPD with acute pulmonary edema in setting of hypertensive emergency. Also concern for community acquired pneumonia in setting of elevated procalcitonin.  - Wean off nasal canula today - Continue ceftriaxone + doxycycline for CAP - Respiratory culture with gram negative rods and gram positive cocci, urine strep pna negative and legionella ag pending  NSTEMI  In setting of hypertensive emergency and cocaine use Troponin S peaked at 2900 and trended down - Continue aspirin and atorvastatin - Continue heparin drip per ACS protocol, pharmacy consulted - Cardiology consulted, awaiting decision  to pursue further coronary artery disease workup via NM myocardial scan vs heart cath  Klebsiella community-acquired pneumoniae Sensitivities showing resistance for ampicillin Continue Rocephin and doxycycline Symptomatology is improving  Dyspnea, suspect cardiac etiology Continue to treat underlying condition  Hypertensive Emergency - Initially required nitro gtt and has been weaned off antihypertensive agents at this time - Continue home amlodipine and hydralazine per cardiology  Acute on Chronic Congestive Heart Failure  In setting of hypertensive emergency - No diuresis at this time - hold home metoprolol   AKI on CKD 3B  He appears to be back to baseline creatinine 1.8 with GFR 43 Creatinine peaked at 3.09 GFR of 23.   Continue to avoid nephrotoxins  Continue to monitor urine output  Renal US shows medical renal disease. No hydronephrosis. Bilateral renal cysts. Left kidney contains echogenic calcification or nodule which may require further evaluation with CT. - Trend renal function  COPD, not in acute exacerbation - Start brovana and budesonide nebs - PRN duonebs   Code Status: Full code  Family Communication: None at bedside  Disposition Plan:     Consultants:  Cardiology  Procedures:  None  Antimicrobials:  Rocephin  Doxycycline  DVT prophylaxis: Heparin drip  Status is: Inpatient   Dispo: The patient is from: Home               Anticipated d/c is to: Home               Anticipated d/c date is: 06/28/2020               Patient currently not stable  for discharge due to persistent symptomatology.  Management for NSTEMI.       Objective: Vitals:   06/26/20 0658 06/26/20 0758 06/26/20 0831 06/26/20 1100  BP:   (!) 192/128 (!) 168/109  Pulse:   90   Resp: _0 Temp:   97.8 F (36.6 C) 98.7 F (37.1 C)  TempSrc:   Oral Oral  SpO2:  97% 100% 100%  Weight: 93.5 kg     Height:        Intake/Output Summary (Last 24 hours) at  06/26/2020 1328 Last data filed at 06/26/2020 1101 Gross per 24 hour  Intake 282.38 ml  Output 1425 ml  Net -1142.62 ml   Filed Weights   06/24/20 0400 06/25/20 0500 06/26/20 0658  Weight: 94.6 kg 94.5 kg 93.5 kg    Exam:   General: 64 y.o. year-old male well developed well nourished in no acute distress.  Alert and oriented x3.  Cardiovascular: Regular rate and rhythm with no rubs or gallops.  No thyromegaly or JVD noted.    Respiratory: Clear to auscultation with no wheezes or rales. Good inspiratory effort.  Abdomen: Soft nontender nondistended with normal bowel sounds x4 quadrants.  Musculoskeletal: Trace lower extremity edema bilaterally.    Psychiatry: Mood is appropriate for condition and setting   Data Reviewed: CBC: Recent Labs  Lab 06/23/20 0110 06/23/20 0137 06/23/20 0424 06/23/20 0424 06/23/20 2209 06/24/20 0040 06/24/20 0838 06/25/20 0303 06/26/20 0721  WBC 5.5  --  8.4  --   --  7.8  --  7.3 6.2  NEUTROABS 2.3  --  7.2  --   --   --   --   --   --   HGB 14.2   < > 13.5   < > 11.9* 11.8* 11.2* 11.6* 11.7*  HCT 47.8   < > 45.3   < > 35.0* 39.2 33.0* 37.0* 35.9*  MCV 91.0  --  91.9  --   --  89.5  --  88.7 86.3  PLT 221  --  170  --   --  175  --  155 152   < > = values in this interval not displayed.   Basic Metabolic Panel: Recent Labs  Lab 06/23/20 0110 06/23/20 0137 06/23/20 0424 06/23/20 0424 06/23/20 1001 06/23/20 2100 06/23/20 2209 06/24/20 0040 06/24/20 0838 06/24/20 1535 06/25/20 0303 06/26/20 0721  NA 145   < > 143   < >  --   --  142 141 141  --  138 140  K 3.6   < > 4.4   < >  --   --  4.4 5.1 4.4  --  3.8 4.1  CL 111  --  109  --   --   --   --  109  --   --  106 108  CO2 24  --  21*  --   --   --   --  19*  --   --  22 23  GLUCOSE 215*  --  124*  --   --   --   --  153*  --   --  100* 123*  BUN 19  --  20  --   --   --   --  35*  --   --  52* 29*  CREATININE 2.25*  --  2.29*  --   --   --   --  2.79*  --   --  3.09* 1.87*    CALCIUM 9.3  --  9.4  --   --   --   --  9.2  --   --  8.8* 9.2  MG  --   --   --   --  2.0 2.0  --  2.1  --  2.2  --   --   PHOS  --   --   --   --  3.5 4.6  --  4.9*  --  5.0*  --   --    < > = values in this interval not displayed.   GFR: Estimated Creatinine Clearance: 45.8 mL/min (A) (by C-G formula based on SCr of 1.87 mg/dL (H)). Liver Function Tests: Recent Labs  Lab 06/23/20 0110  AST 59*  ALT 59*  ALKPHOS 156*  BILITOT 0.6  PROT 7.4  ALBUMIN 3.6   No results for input(s): LIPASE, AMYLASE in the last 168 hours. No results for input(s): AMMONIA in the last 168 hours. Coagulation Profile: No results for input(s): INR, PROTIME in the last 168 hours. Cardiac Enzymes: No results for input(s): CKTOTAL, CKMB, CKMBINDEX, TROPONINI in the last 168 hours. BNP (last 3 results) No results for input(s): PROBNP in the last 8760 hours. HbA1C: No results for input(s): HGBA1C in the last 72 hours. CBG: Recent Labs  Lab 06/25/20 2052 06/25/20 2349 06/26/20 0427 06/26/20 0828 06/26/20 1217  GLUCAP 87 112* 94 112* 99   Lipid Profile: Recent Labs    06/25/20 0303 06/26/20 0721  TRIG 72 109   Thyroid Function Tests: No results for input(s): TSH, T4TOTAL, FREET4, T3FREE, THYROIDAB in the last 72 hours. Anemia Panel: No results for input(s): VITAMINB12, FOLATE, FERRITIN, TIBC, IRON, RETICCTPCT in the last 72 hours. Urine analysis:    Component Value Date/Time   COLORURINE YELLOW 06/23/2020 0518   APPEARANCEUR HAZY (A) 06/23/2020 0518   LABSPEC 1.010 06/23/2020 0518   PHURINE 5.0 06/23/2020 0518   GLUCOSEU NEGATIVE 06/23/2020 0518   HGBUR SMALL (A) 06/23/2020 0518   BILIRUBINUR NEGATIVE 06/23/2020 0518   KETONESUR NEGATIVE 06/23/2020 0518   PROTEINUR 100 (A) 06/23/2020 0518   NITRITE NEGATIVE 06/23/2020 0518   LEUKOCYTESUR NEGATIVE 06/23/2020 0518   Sepsis Labs: _0 (procalcitonin:4,lacticidven:4)  ) Recent Results (from the past 240 hour(s))  SARS  Coronavirus 2 by RT PCR (hospital order, performed in [place] hospital lab) Nasopharyngeal Nasopharyngeal Swab     Status: None   Collection Time: 06/23/20  1:18 AM   Specimen: Nasopharyngeal Swab  Result Value Ref Range Status   SARS Coronavirus 2 NEGATIVE NEGATIVE Final    Comment: (NOTE) SARS-CoV-2 target nucleic acids are NOT DETECTED.  The SARS-CoV-2 RNA is generally detectable in upper and lower respiratory specimens during the acute phase of infection. The lowest concentration of SARS-CoV-2 viral copies this assay can detect is 250 copies / mL. A negative result does not preclude SARS-CoV-2 infection and should not be used as the sole basis for treatment or other patient management decisions.  A negative result may occur with improper specimen collection / handling, submission of specimen other than nasopharyngeal swab, presence of viral mutation(s) within the areas targeted by this assay, and inadequate number of viral copies (<250 copies / mL). A negative result must be combined with clinical observations, patient history, and epidemiological information.  Fact Sheet for Patients:   StrictlyIdeas.no  Fact Sheet for Healthcare Providers: BankingDealers.co.za  This test is not yet approved or  cleared by the [place] FDA and has  been authorized for detection and/or diagnosis of SARS-CoV-2 by FDA under an Emergency Use Authorization (EUA).  This EUA will remain in effect (meaning this test can be used) for the duration of the COVID-19 declaration under Section 564(b)(1) of the Act, 21 U.S.C. section 360bbb-3(b)(1), unless the authorization is terminated or revoked sooner.  Performed at [facility], [place] [address] [place], [place] [address]   MRSA PCR Screening     Status: None   Collection Time: 06/23/20  9:50 PM   Specimen: Nasal Mucosa; Nasopharyngeal  Result Value Ref Range Status   MRSA by PCR NEGATIVE  NEGATIVE Final    Comment:        The GeneXpert MRSA Assay (FDA approved for NASAL specimens only), is one component of a comprehensive MRSA colonization surveillance program. It is not intended to diagnose MRSA infection nor to guide or monitor treatment for MRSA infections. Performed at [facility], [place] [address], [place], [place] [address]   Culture, respiratory (non-expectorated)     Status: None   Collection Time: 06/24/20 10:52 AM   Specimen: Tracheal Aspirate; Respiratory  Result Value Ref Range Status   Specimen Description TRACHEAL ASPIRATE  Final   Special Requests NONE  Final   Gram Stain   Final    ABUNDANT WBC PRESENT,BOTH PMN AND MONONUCLEAR MODERATE GRAM POSITIVE COCCI FEW GRAM NEGATIVE RODS RARE GRAM VARIABLE ROD    Culture   Final    FEW KLEBSIELLA PNEUMONIAE NO STAPHYLOCOCCUS AUREUS ISOLATED No Pseudomonas species isolated Performed at [facility], [place] [address] [place], [place] [address]    Report Status 06/26/2020 FINAL  Final   Organism ID, Bacteria KLEBSIELLA PNEUMONIAE  Final      Susceptibility   Klebsiella pneumoniae - MIC*    AMPICILLIN RESISTANT Resistant     CEFAZOLIN <=4 SENSITIVE Sensitive     CEFEPIME <=0.12 SENSITIVE Sensitive     CEFTAZIDIME <=1 SENSITIVE Sensitive     CEFTRIAXONE <=0.25 SENSITIVE Sensitive     CIPROFLOXACIN <=0.25 SENSITIVE Sensitive     GENTAMICIN <=1 SENSITIVE Sensitive     IMIPENEM <=0.25 SENSITIVE Sensitive     TRIMETH/SULFA <=20 SENSITIVE Sensitive     AMPICILLIN/SULBACTAM 4 SENSITIVE Sensitive     PIP/TAZO <=4 SENSITIVE Sensitive     * FEW KLEBSIELLA PNEUMONIAE      Studies: No results found.  Scheduled Meds:  amLODipine  5 mg Oral BID   arformoterol  15 mcg Nebulization BID   aspirin EC  81 mg Oral Daily   atorvastatin  40 mg Oral Daily   budesonide (PULMICORT) nebulizer solution  0.25 mg Nebulization BID   chlorhexidine gluconate (MEDLINE KIT)  15 mL Mouth Rinse BID    Chlorhexidine Gluconate Cloth  6 each Topical Q0600   cloNIDine  0.2 mg Oral QHS   clopidogrel  75 mg Oral Daily   docusate  100 mg Oral BID   hydrALAZINE  50 mg Oral TID   insulin aspart  0-9 Units Subcutaneous Q4H   ipratropium-albuterol  3 mL Nebulization BID   [START ON 06/27/2020] isosorbide mononitrate  120 mg Oral Daily   mouth rinse  15 mL Mouth Rinse BID   metoprolol succinate  50 mg Oral Daily   pantoprazole  40 mg Oral QHS   polyethylene glycol  17 g Oral Daily    Continuous Infusions:  sodium chloride 10 mL/hr at 06/24/20 1500   cefTRIAXone (ROCEPHIN)  IV 2 g (06/26/20 1015)  doxycycline (VIBRAMYCIN) IV 100 mg (06/26/20 1008)   fentaNYL infusion INTRAVENOUS Stopped (06/24/20 1325)   heparin 1,600 Units/hr (06/26/20 1003)   propofol (DIPRIVAN) infusion Stopped (06/24/20 1325)     LOS: 3 days     Kayleen Memos, MD [facility] Pager [phone]  If 7PM-7AM, please contact night-coverage [email or web address] Password [email or web address] 06/26/2020, 1:28 PM

## 2020-06-26 NOTE — Consult Note (Signed)
Ref: [name], MD   Subjective:  Awake. No chest pain. Respiratory distress continues. Uncontrolled hypertension. Decreasing leg edema. HS-troponin I trending downward. Renal function apparently improving.  Objective:  Vital Signs in the last 24 hours: Temp:  [97.8 F (36.6 C)-98.4 F (36.9 C)] 97.8 F (36.6 C) (09/05 0831) Pulse Rate:  [43-90] 90 (09/05 0831) Cardiac Rhythm: Normal sinus rhythm (09/04 1900) Resp:  [12-24] 20 (09/05 0831) BP: (143-193)/(76-151) 192/128 (09/05 0831) SpO2:  [89 %-100 %] 100 % (09/05 0831) Weight:  [93.5 kg] 93.5 kg (09/05 0658)  Physical Exam: BP Readings from Last 1 Encounters:  06/26/20 (!) 192/128     Wt Readings from Last 1 Encounters:  06/26/20 93.5 kg    Weight change: -1 kg Body mass index is 29.58 kg/m. HEENT: [place]/AT, Eyes-Brown, Conjunctiva-Pink, Sclera-Non-icteric Neck: No JVD, No bruit, Trachea midline. Lungs:  Clearing, Bilateral. Cardiac:  Regular rhythm, normal S1 and S2, no S3. II/VI systolic murmur. Abdomen:  Soft, non-tender. BS present. Extremities:  1 + edema present. No cyanosis. No clubbing. CNS: AxOx3, Cranial nerves grossly intact, moves all 4 extremities.  Skin: Warm and dry.   Intake/Output from previous day: 09/04 0701 - 09/05 0700 In: 724 [P.O.:240; I.V.:134; IV Piggyback:350] Out: 1050 [Urine:1050]    Lab Results: BMET    Component Value Date/Time   NA 140 06/26/2020 0721   NA 138 06/25/2020 0303   NA 141 06/24/2020 0838   K 4.1 06/26/2020 0721   K 3.8 06/25/2020 0303   K 4.4 06/24/2020 0838   CL 108 06/26/2020 0721   CL 106 06/25/2020 0303   CL 109 06/24/2020 0040   CO2 23 06/26/2020 0721   CO2 22 06/25/2020 0303   CO2 19 (L) 06/24/2020 0040   GLUCOSE 123 (H) 06/26/2020 0721   GLUCOSE 100 (H) 06/25/2020 0303   GLUCOSE 153 (H) 06/24/2020 0040   BUN 29 (H) 06/26/2020 0721   BUN 52 (H) 06/25/2020 0303   BUN 35 (H) 06/24/2020 0040   CREATININE 1.87 (H) 06/26/2020 0721   CREATININE 3.09 (H)  06/25/2020 0303   CREATININE 2.79 (H) 06/24/2020 0040   CALCIUM 9.2 06/26/2020 0721   CALCIUM 8.8 (L) 06/25/2020 0303   CALCIUM 9.2 06/24/2020 0040   GFRNONAA 37 (L) 06/26/2020 0721   GFRNONAA 20 (L) 06/25/2020 0303   GFRNONAA 23 (L) 06/24/2020 0040   GFRAA 43 (L) 06/26/2020 0721   GFRAA 23 (L) 06/25/2020 0303   GFRAA 27 (L) 06/24/2020 0040   CBC    Component Value Date/Time   WBC 6.2 06/26/2020 0721   RBC 4.16 (L) 06/26/2020 0721   HGB 11.7 (L) 06/26/2020 0721   HCT 35.9 (L) 06/26/2020 0721   PLT 152 06/26/2020 0721   MCV 86.3 06/26/2020 0721   MCH 28.1 06/26/2020 0721   MCHC 32.6 06/26/2020 0721   RDW 13.6 06/26/2020 0721   LYMPHSABS 0.7 06/23/2020 0424   MONOABS 0.3 06/23/2020 0424   EOSABS 0.0 06/23/2020 0424   BASOSABS 0.0 06/23/2020 0424   HEPATIC Function Panel Recent Labs    06/23/20 0110  PROT 7.4   HEMOGLOBIN A1C No components found for: HGA1C,  MPG CARDIAC ENZYMES No results found for: CKTOTAL, CKMB, CKMBINDEX, TROPONINI BNP No results for input(s): PROBNP in the last 8760 hours. TSH No results for input(s): TSH in the last 8760 hours. CHOLESTEROL No results for input(s): CHOL in the last 8760 hours.  Scheduled Meds: . amLODipine  5 mg Oral BID  . arformoterol  15 mcg  Nebulization BID  . aspirin EC  81 mg Oral Daily  . atorvastatin  40 mg Oral Daily  . budesonide (PULMICORT) nebulizer solution  0.25 mg Nebulization BID  . chlorhexidine gluconate (MEDLINE KIT)  15 mL Mouth Rinse BID  . Chlorhexidine Gluconate Cloth  6 each Topical Q0600  . cloNIDine  0.2 mg Oral QHS  . clopidogrel  75 mg Oral Daily  . docusate  100 mg Oral BID  . hydrALAZINE  50 mg Oral TID  . insulin aspart  0-9 Units Subcutaneous Q4H  . ipratropium-albuterol  3 mL Nebulization BID  . [START ON 06/27/2020] isosorbide mononitrate  120 mg Oral Daily  . mouth rinse  15 mL Mouth Rinse BID  . metoprolol succinate  50 mg Oral Daily  . pantoprazole  40 mg Oral QHS  . polyethylene  glycol  17 g Oral Daily   Continuous Infusions: . sodium chloride 10 mL/hr at 06/24/20 1500  . cefTRIAXone (ROCEPHIN)  IV 2 g (06/26/20 1015)  . doxycycline (VIBRAMYCIN) IV 100 mg (06/26/20 1008)  . fentaNYL infusion INTRAVENOUS Stopped (06/24/20 1325)  . heparin 1,600 Units/hr (06/26/20 1003)  . propofol (DIPRIVAN) infusion Stopped (06/24/20 1325)   PRN Meds:.sodium chloride, albuterol, docusate sodium, fentaNYL, fentaNYL (SUBLIMAZE) injection, fentaNYL (SUBLIMAZE) injection, midazolam, midazolam, polyethylene glycol  Assessment/Plan: Acute NSTEMI with cocaine abuise Acute pulmonary edema Acute respiratory failure Acute on chronic systolic left heart failure, HFrEF CAD COPD Uncontrolled HTN Type 2 DM Acute on chronic renal failure, improving Obesity  Add Toprol XL with vasodilators.   LOS: 3 days   Time spent including chart review, lab review, examination, discussion with patient : 30 min   [name]  MD  06/26/2020, 10:29 AM

## 2020-06-26 NOTE — Progress Notes (Signed)
ANTICOAGULATION CONSULT NOTE  Pharmacy Consult for IV heparin Indication: chest pain/ACS  No Known Allergies  Patient Measurements: Height: 5\' 10"  (177.8 cm) Weight: 93.5 kg (206 lb 2.1 oz) IBW/kg (Calculated) : 73 Heparin Dosing Weight: 95.2 kg  Vital Signs: Temp: 97.8 F (36.6 C) (09/05 0831) Temp Source: Oral (09/05 0831) BP: 192/128 (09/05 0831) Pulse Rate: 90 (09/05 0831)  Labs: Recent Labs    06/23/20 1001 06/23/20 1333 06/23/20 2100 06/23/20 2209 06/24/20 0040 06/24/20 0803 06/24/20 08/24/20 06/24/20 08/24/20 06/24/20 1535 06/25/20 0303 06/25/20 1224 06/26/20 0721  HGB  --   --   --    < > 11.8*  --  11.2*   < >  --  11.6*  --  11.7*  HCT  --   --   --    < > 39.2  --  33.0*  --   --  37.0*  --  35.9*  PLT  --   --   --   --  175  --   --   --   --  155  --  152  HEPARINUNFRC  --    < >  --   --  0.58   < >  --   --  0.79* 0.46  --  0.27*  CREATININE  --   --   --   --  2.79*  --   --   --   --  3.09*  --  1.87*  TROPONINIHS 1,305*  --  2,971*  --   --   --   --   --   --   --  1,702*  --    < > = values in this interval not displayed.    Estimated Creatinine Clearance: 45.8 mL/min (A) (by C-G formula based on SCr of 1.87 mg/dL (H)).   Assessment: 64 yo male admitted with respiratory failure and hypertensive emergency.  Pharmacy asked to start IV heparin for suspected ACS. Troponins elevated x3  No known anticoagulants PTA.  Heparin level subtherapeutic at 0.27 on gtt at 1450 units/hr after previously being therapeutic. No bleeding noted. Per nurse, no issues with infusion.   Goal of Therapy:  Heparin level 0.3-0.7 units/ml Monitor platelets by anticoagulation protocol: Yes   Plan:  Heparin bolus 1400 units x1 Increase heparin to 1600 units/hr 6h heparin level  Daily heparin level and CBC  77, PharmD PGY1 Acute Care Pharmacy Resident Phone: [phone] 06/26/2020 9:39 AM  Please check [email or web address] for unit specific pharmacy phone numbers.

## 2020-06-26 NOTE — Progress Notes (Signed)
ANTICOAGULATION CONSULT NOTE  Pharmacy Consult for IV heparin Indication: chest pain/ACS  No Known Allergies  Patient Measurements: Height: 5\' 10"  (177.8 cm) Weight: 93.5 kg (206 lb 2.1 oz) IBW/kg (Calculated) : 73 Heparin Dosing Weight: 95.2 kg  Vital Signs: Temp: 97.7 F (36.5 C) (09/05 1523) Temp Source: Oral (09/05 1523) BP: 153/89 (09/05 1523) Pulse Rate: 78 (09/05 1523)  Labs: Recent Labs    06/23/20 2100 06/23/20 2209 06/24/20 0040 06/24/20 0803 06/24/20 08/24/20 06/24/20 1535 06/25/20 0303 06/25/20 1224 06/26/20 0721 06/26/20 1605  HGB  --    < > 11.8*  --  11.2*  --  11.6*  --  11.7*  --   HCT  --    < > 39.2  --  33.0*  --  37.0*  --  35.9*  --   PLT  --   --  175  --   --   --  155  --  152  --   HEPARINUNFRC  --   --  0.58   < >  --    < > 0.46  --  0.27* 0.42  CREATININE  --   --  2.79*  --   --   --  3.09*  --  1.87*  --   TROPONINIHS 2,971*  --   --   --   --   --   --  1,702*  --   --    < > = values in this interval not displayed.    Estimated Creatinine Clearance: 45.8 mL/min (A) (by C-G formula based on SCr of 1.87 mg/dL (H)).   Assessment: 64 yo male admitted with respiratory failure and hypertensive emergency.  Pharmacy asked to start IV heparin for suspected ACS. Troponins elevated x3  No known anticoagulants PTA. -Heparin level therapeutic at 0.42 on gtt at 1600 units/hr.  Goal of Therapy:  Heparin level 0.3-0.7 units/ml Monitor platelets by anticoagulation protocol: Yes   Plan:  No heparin changes needed Daily heparin level and CBC  77, PharmD Clinical Pharmacist **Pharmacist phone directory can now be found on [email or web address] ([email or web address]).  Listed under [facility].

## 2020-06-27 LAB — CBC
HCT: 37.2 % — ABNORMAL LOW (ref 39.0–52.0)
Hemoglobin: 11.9 g/dL — ABNORMAL LOW (ref 13.0–17.0)
MCH: 27 pg (ref 26.0–34.0)
MCHC: 32 g/dL (ref 30.0–36.0)
MCV: 84.5 fL (ref 80.0–100.0)
Platelets: 145 10*3/uL — ABNORMAL LOW (ref 150–400)
RBC: 4.4 MIL/uL (ref 4.22–5.81)
RDW: 13.2 % (ref 11.5–15.5)
WBC: 5.3 10*3/uL (ref 4.0–10.5)
nRBC: 0 % (ref 0.0–0.2)

## 2020-06-27 LAB — BASIC METABOLIC PANEL
Anion gap: 7 (ref 5–15)
BUN: 20 mg/dL (ref 8–23)
CO2: 25 mmol/L (ref 22–32)
Calcium: 9.1 mg/dL (ref 8.9–10.3)
Chloride: 104 mmol/L (ref 98–111)
Creatinine, Ser: 1.54 mg/dL — ABNORMAL HIGH (ref 0.61–1.24)
GFR calc Af Amer: 54 mL/min — ABNORMAL LOW (ref >60)
GFR calc non Af Amer: 47 mL/min — ABNORMAL LOW (ref >60)
Glucose, Bld: 93 mg/dL (ref 70–99)
Potassium: 4 mmol/L (ref 3.5–5.1)
Sodium: 136 mmol/L (ref 135–145)

## 2020-06-27 LAB — GLUCOSE, CAPILLARY
Glucose-Capillary: 92 mg/dL (ref 70–99)
Glucose-Capillary: 95 mg/dL (ref 70–99)
Glucose-Capillary: 95 mg/dL (ref 70–99)
Glucose-Capillary: 227 mg/dL — ABNORMAL HIGH (ref 70–99)
Glucose-Capillary: 67 mg/dL — ABNORMAL LOW (ref 70–99)
Glucose-Capillary: 116 mg/dL — ABNORMAL HIGH (ref 70–99)

## 2020-06-27 LAB — HEPARIN LEVEL (UNFRACTIONATED): Heparin Unfractionated: 0.32 IU/mL (ref 0.30–0.70)

## 2020-06-27 LAB — TRIGLYCERIDES: Triglycerides: 101 mg/dL (ref <150)

## 2020-06-27 LAB — LEGIONELLA PNEUMOPHILA SEROGP 1 UR AG: L. pneumophila Serogp 1 Ur Ag: NEGATIVE

## 2020-06-27 LAB — SARS CORONAVIRUS 2 BY RT PCR (HOSPITAL ORDER, PERFORMED IN ~~LOC~~ HOSPITAL LAB): SARS Coronavirus 2: NEGATIVE

## 2020-06-27 MED ORDER — DIGOXIN 125 MCG PO TABS
0.0625 mg | ORAL_TABLET | Freq: Every day | ORAL | Status: DC
  Administered 2020-06-27 – 2020-06-29 (×3): 0.0625 mg via ORAL
  Filled 2020-06-27 (×3): qty 1

## 2020-06-27 MED ORDER — SENNOSIDES-DOCUSATE SODIUM 8.6-50 MG PO TABS
2.0000 | ORAL_TABLET | Freq: Two times a day (BID) | ORAL | Status: DC
  Administered 2020-06-27 – 2020-06-29 (×5): 2 via ORAL
  Filled 2020-06-27 (×5): qty 2

## 2020-06-27 MED ORDER — LORATADINE 10 MG PO TABS
10.0000 mg | ORAL_TABLET | Freq: Every day | ORAL | Status: DC
  Administered 2020-06-27 – 2020-06-29 (×3): 10 mg via ORAL
  Filled 2020-06-27 (×3): qty 1

## 2020-06-27 MED ORDER — DOXYCYCLINE HYCLATE 100 MG PO TABS
100.0000 mg | ORAL_TABLET | Freq: Two times a day (BID) | ORAL | Status: DC
  Administered 2020-06-27 – 2020-06-29 (×5): 100 mg via ORAL
  Filled 2020-06-27 (×5): qty 1

## 2020-06-27 NOTE — Progress Notes (Signed)
ANTICOAGULATION CONSULT NOTE  Pharmacy Consult for IV heparin Indication: chest pain/ACS  No Known Allergies  Patient Measurements: Height: 5\' 10"  (177.8 cm) Weight: 88.7 kg (195 lb 8 oz) IBW/kg (Calculated) : 73 Heparin Dosing Weight: 95.2 kg  Vital Signs: Temp: 97.7 F (36.5 C) (09/06 0407) Temp Source: Oral (09/06 0407) BP: 171/118 (09/06 0407) Pulse Rate: 78 (09/06 0407)  Labs: Recent Labs    06/25/20 0303 06/25/20 0303 06/25/20 1224 06/26/20 0721 06/26/20 1605 06/27/20 0239  HGB 11.6*   < >  --  11.7*  --  11.9*  HCT 37.0*  --   --  35.9*  --  37.2*  PLT 155  --   --  152  --  145*  HEPARINUNFRC 0.46   < >  --  0.27* 0.42 0.32  CREATININE 3.09*  --   --  1.87*  --  1.54*  TROPONINIHS  --   --  1,702*  --   --   --    < > = values in this interval not displayed.    Estimated Creatinine Clearance: 54.4 mL/min (A) (by C-G formula based on SCr of 1.54 mg/dL (H)).   Assessment: 64 yo male admitted with respiratory failure and hypertensive emergency.  Pharmacy asked to start IV heparin for suspected ACS. Troponins elevated x3  No known anticoagulants PTA.  Heparin level continues to be therapeutic at 0.32 on gtt at 1600 units/hr. CBC stable, no bleeding noted. Will to follow up cardiology workup for NSTEMI and plans for discharge.   Goal of Therapy:  Heparin level 0.3-0.7 units/ml Monitor platelets by anticoagulation protocol: Yes   Plan:  Continue heparin at 1600 units/hr Daily heparin level and CBC  77, PharmD PGY1 Acute Care Pharmacy Resident Phone: [phone] 06/27/2020 7:31 AM  Please check [email or web address] for unit specific pharmacy phone numbers.

## 2020-06-27 NOTE — Progress Notes (Signed)
RN spoke with Dr. Algie Coffer regarding Covid swab as he had been swabbed on 06/23/20. , he stated that for the cath lab COVID swab result required to be with in 3 days. Patient re swabed as ordered . Alyn Riedinger, Randall An RN

## 2020-06-27 NOTE — Consult Note (Signed)
Ref: [name], MD   Subjective:  No chest pain. Decreasing leg edema. Creatinine is 1.54. VS stable.  Objective:  Vital Signs in the last 24 hours: Temp:  [97.7 F (36.5 C)-98.7 F (37.1 C)] 97.8 F (36.6 C) (09/06 0820) Pulse Rate:  [66-78] 66 (09/06 0820) Cardiac Rhythm: Normal sinus rhythm (09/05 2003) Resp:  [16-20] 18 (09/06 0820) BP: (153-177)/(89-129) 175/127 (09/06 0820) SpO2:  [98 %-100 %] 100 % (09/06 0820) Weight:  [88.7 kg] 88.7 kg (09/06 0536)  Physical Exam: BP Readings from Last 1 Encounters:  06/27/20 (!) 175/127     Wt Readings from Last 1 Encounters:  06/27/20 88.7 kg    Weight change: -4.822 kg Body mass index is 28.05 kg/m. HEENT: [place]/AT, Eyes-Brown, Conjunctiva-Pink, Sclera-Non-icteric Neck: No JVD, No bruit, Trachea midline. Lungs:  Clear, Bilateral. Cardiac:  Regular rhythm, normal S1 and S2, no S3. II/VI systolic murmur. Abdomen:  Soft, non-tender. BS present. Extremities:  Trace edema present. No cyanosis. No clubbing. CNS: AxOx3, Cranial nerves grossly intact, moves all 4 extremities.  Skin: Warm and dry.   Intake/Output from previous day: 09/05 0701 - 09/06 0700 In: 15 [P.O.:240; I.V.:192; IV Piggyback:250] Out: 1324 [Urine:3875]    Lab Results: BMET    Component Value Date/Time   NA 136 06/27/2020 0239   NA 140 06/26/2020 0721   NA 138 06/25/2020 0303   K 4.0 06/27/2020 0239   K 4.1 06/26/2020 0721   K 3.8 06/25/2020 0303   CL 104 06/27/2020 0239   CL 108 06/26/2020 0721   CL 106 06/25/2020 0303   CO2 25 06/27/2020 0239   CO2 23 06/26/2020 0721   CO2 22 06/25/2020 0303   GLUCOSE 93 06/27/2020 0239   GLUCOSE 123 (H) 06/26/2020 0721   GLUCOSE 100 (H) 06/25/2020 0303   BUN 20 06/27/2020 0239   BUN 29 (H) 06/26/2020 0721   BUN 52 (H) 06/25/2020 0303   CREATININE 1.54 (H) 06/27/2020 0239   CREATININE 1.87 (H) 06/26/2020 0721   CREATININE 3.09 (H) 06/25/2020 0303   CALCIUM 9.1 06/27/2020 0239   CALCIUM 9.2 06/26/2020  0721   CALCIUM 8.8 (L) 06/25/2020 0303   GFRNONAA 47 (L) 06/27/2020 0239   GFRNONAA 37 (L) 06/26/2020 0721   GFRNONAA 20 (L) 06/25/2020 0303   GFRAA 54 (L) 06/27/2020 0239   GFRAA 43 (L) 06/26/2020 0721   GFRAA 23 (L) 06/25/2020 0303   CBC    Component Value Date/Time   WBC 5.3 06/27/2020 0239   RBC 4.40 06/27/2020 0239   HGB 11.9 (L) 06/27/2020 0239   HCT 37.2 (L) 06/27/2020 0239   PLT 145 (L) 06/27/2020 0239   MCV 84.5 06/27/2020 0239   MCH 27.0 06/27/2020 0239   MCHC 32.0 06/27/2020 0239   RDW 13.2 06/27/2020 0239   LYMPHSABS 0.7 06/23/2020 0424   MONOABS 0.3 06/23/2020 0424   EOSABS 0.0 06/23/2020 0424   BASOSABS 0.0 06/23/2020 0424   HEPATIC Function Panel Recent Labs    06/23/20 0110  PROT 7.4   HEMOGLOBIN A1C No components found for: HGA1C,  MPG CARDIAC ENZYMES No results found for: CKTOTAL, CKMB, CKMBINDEX, TROPONINI BNP No results for input(s): PROBNP in the last 8760 hours. TSH No results for input(s): TSH in the last 8760 hours. CHOLESTEROL No results for input(s): CHOL in the last 8760 hours.  Scheduled Meds: . amLODipine  5 mg Oral BID  . arformoterol  15 mcg Nebulization BID  . aspirin EC  81 mg Oral Daily  .  atorvastatin  40 mg Oral Daily  . budesonide (PULMICORT) nebulizer solution  0.25 mg Nebulization BID  . chlorhexidine gluconate (MEDLINE KIT)  15 mL Mouth Rinse BID  . Chlorhexidine Gluconate Cloth  6 each Topical Q0600  . cloNIDine  0.2 mg Oral QHS  . clopidogrel  75 mg Oral Daily  . digoxin  0.0625 mg Oral Daily  . docusate  100 mg Oral BID  . doxycycline  100 mg Oral Q12H  . hydrALAZINE  50 mg Oral TID  . insulin aspart  0-9 Units Subcutaneous Q4H  . isosorbide mononitrate  120 mg Oral Daily  . mouth rinse  15 mL Mouth Rinse BID  . metoprolol succinate  50 mg Oral Daily  . pantoprazole  40 mg Oral QHS  . polyethylene glycol  17 g Oral Daily   Continuous Infusions: . sodium chloride 10 mL/hr at 06/24/20 1500  . cefTRIAXone  (ROCEPHIN)  IV 2 g (06/26/20 1015)  . 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  Patient understood procedure, risks and alernatives   LOS: 4 days   Time spent including chart review, lab review, examination, discussion with patient : 30 min   [name]  MD  06/27/2020, 9:12 AM

## 2020-06-27 NOTE — Progress Notes (Signed)
PROGRESS NOTE  Phillip Kennedy ZOX:[id number] DOB: [birth year] DOA: 06/23/2020 PCP: Phillip Dials, MD  HPI/Recap of past 92 hours: 64 year old male with history of hypertension, COPD who came into the emergency room with shortness of breath he did not tolerate CPAP on arrival after being used by ~EMS and had to be emergently intubated on arrival to the emergency room.  Patient was severely hypertensive chest x-ray was showing bilateral pulmonary infiltrates worse on the right side he was started on nitroglycerin drip and was sedated.  Obviously patient was intubated and sedated when I came to assess him and was not able to give any history.  Wife was at bedside and she stated that he has been having worsening shortness of breath over the last month but denies any fever chills or rigors.  Patient did not complain to her about any chest pain.  Patient wife stated that he did have a tick bite but she could not find where.  As per wife patient did receive 2 shots of the Covid vaccine. Covid test is pending at the time of dictation.  06/27/20: Seen and seen and examined.  Dyspnea improved today.  Planned cardiac cath tomorrow 06/28/2020.  Assessment/Plan: Active Problems:   Acute pulmonary edema (HCC)  Resolved acute Hypoxemic and Hypercapnic respiratory failure Multifactorial in setting of underlying COPD with acute pulmonary edema in setting of hypertensive emergency. Also concern for community acquired pneumonia in setting of elevated procalcitonin.  - Continue ceftriaxone + doxycycline for CAP - Respiratory culture with gram negative rods and gram positive cocci, urine strep pna negative and legionella ag pending  NSTEMI  In setting of hypertensive emergency and cocaine use Troponin S peaked at 2900 and trended down - Continue aspirin and atorvastatin - Continue heparin drip per ACS protocol, pharmacy consulted\               -Planned cardiac cath tomorrow 06/28/2020. -N.p.o. after  midnight  Klebsiella community-acquired pneumoniae Sensitivities showing resistance for ampicillin Continue Rocephin and doxycycline Symptomatology is improving  Dyspnea, suspect cardiac etiology Continue to treat underlying condition  Hypertensive Emergency - Initially required nitro gtt and has been weaned off antihypertensive agents at this time - Continue home amlodipine and hydralazine per cardiology  Acute on Chronic  combined systolic and diastolic congestive Heart Failure  In setting of hypertensive emergency 2D echo done on 06/23/2020 showed LVEF 25 to 30% with grade 1 diastolic dysfunction, left atrial size was moderately dilated, left ventricle demonstrates global hypokinesis. Continue strict I's and O's and daily weight  AKI on CKD 3A  He appears to be back to baseline creatinine 1.5 with GFR 54 Creatinine peaked at 3.09 GFR of 23.   Creatinine is downtrending 1.5 with GFR 54. Continue to avoid nephrotoxins  Continue to monitor urine output  Renal US shows medical renal disease. No hydronephrosis. Bilateral renal cysts. Left kidney contains echogenic calcification or nodule which may require further evaluation with CT. -Continue to trend renal function  COPD, not in acute exacerbation Continue bronchodilators - Start brovana and budesonide nebs - PRN duonebs  Constipation Start Senokot 2 tablet twice daily and daily MiraLAX   Code Status: Full code  Family Communication: None at bedside  Disposition Plan:     Consultants:  Cardiology  Procedures:  None  Antimicrobials:  Rocephin  Doxycycline  DVT prophylaxis: Heparin drip  Status is: Inpatient   Dispo: The patient is from: Home  Anticipated d/c is to: Home               Anticipated d/c date is: 06/29/2020               Patient currently not stable for discharge due to planned heart cath tomorrow.      Objective: Vitals:   06/27/20 0813 06/27/20 0820 06/27/20 1108  06/27/20 1200  BP:  (!) 175/127 (!) 151/115 (!) 163/90  Pulse:  66 70 76  Resp:  18  18  Temp:  97.8 F (36.6 C)    TempSrc:  Oral    SpO2: 100% 100%  100%  Weight:      Height:        Intake/Output Summary (Last 24 hours) at 06/27/2020 1431 Last data filed at 06/27/2020 0824 Gross per 24 hour  Intake 682 ml  Output 3350 ml  Net -2668 ml   Filed Weights   06/25/20 0500 06/26/20 0658 06/27/20 0536  Weight: 94.5 kg 93.5 kg 88.7 kg    Exam:  . General: 63 y.o. year-old male further evaluation no acute distress.  Alert and oriented x3.  . Cardiovascular: Regular rate and rhythm no rubs or gallops..   . Respiratory: Clear to auscultation no wheezes or rales.   . Abdomen: Soft nontender normal bowel sounds present. . Musculoskeletal: Trace lower extremity edema bilaterally. Marland Kitchen Psychiatry: Mood is appropriate for condition and setting.  Data Reviewed: CBC: Recent Labs  Lab 06/23/20 0110 06/23/20 0137 06/23/20 0424 06/23/20 2209 06/24/20 0040 06/24/20 5329 06/25/20 0303 06/26/20 0721 06/27/20 0239  WBC 5.5   < > 8.4  --  7.8  --  7.3 6.2 5.3  NEUTROABS 2.3  --  7.2  --   --   --   --   --   --   HGB 14.2   < > 13.5   < > 11.8* 11.2* 11.6* 11.7* 11.9*  HCT 47.8   < > 45.3   < > 39.2 33.0* 37.0* 35.9* 37.2*  MCV 91.0   < > 91.9  --  89.5  --  88.7 86.3 84.5  PLT 221   < > 170  --  175  --  155 152 145*   < > = values in this interval not displayed.   Basic Metabolic Panel: Recent Labs  Lab 06/23/20 0424 06/23/20 1001 06/23/20 2100 06/23/20 2209 06/24/20 0040 06/24/20 9242 06/24/20 1535 06/25/20 0303 06/26/20 0721 06/27/20 0239  NA 143  --   --    < > 141 141  --  138 140 136  K 4.4  --   --    < > 5.1 4.4  --  3.8 4.1 4.0  CL 109  --   --   --  109  --   --  106 108 104  CO2 21*  --   --   --  19*  --   --  22 23 25   GLUCOSE 124*  --   --   --  153*  --   --  100* 123* 93  BUN 20  --   --   --  35*  --   --  52* 29* 20  CREATININE 2.29*  --   --   --  2.79*   --   --  3.09* 1.87* 1.54*  CALCIUM 9.4  --   --   --  9.2  --   --  8.8* 9.2 9.1  MG  --  2.0 2.0  --  2.1  --  2.2  --   --   --   PHOS  --  3.5 4.6  --  4.9*  --  5.0*  --   --   --    < > = values in this interval not displayed.   GFR: Estimated Creatinine Clearance: 54.4 mL/min (A) (by C-G formula based on SCr of 1.54 mg/dL (H)). Liver Function Tests: Recent Labs  Lab 06/23/20 0110  AST 59*  ALT 59*  ALKPHOS 156*  BILITOT 0.6  PROT 7.4  ALBUMIN 3.6   No results for input(s): LIPASE, AMYLASE in the last 168 hours. No results for input(s): AMMONIA in the last 168 hours. Coagulation Profile: No results for input(s): INR, PROTIME in the last 168 hours. Cardiac Enzymes: No results for input(s): CKTOTAL, CKMB, CKMBINDEX, TROPONINI in the last 168 hours. BNP (last 3 results) No results for input(s): PROBNP in the last 8760 hours. HbA1C: No results for input(s): HGBA1C in the last 72 hours. CBG: Recent Labs  Lab 06/26/20 2037 06/26/20 2355 06/27/20 0403 06/27/20 0832 06/27/20 1212  GLUCAP 86 103* 92 95 95   Lipid Profile: Recent Labs    06/26/20 0721 06/27/20 0239  TRIG 109 101   Thyroid Function Tests: No results for input(s): TSH, T4TOTAL, FREET4, T3FREE, THYROIDAB in the last 72 hours. Anemia Panel: No results for input(s): VITAMINB12, FOLATE, FERRITIN, TIBC, IRON, RETICCTPCT in the last 72 hours. Urine analysis:    Component Value Date/Time   COLORURINE YELLOW 06/23/2020 0518   APPEARANCEUR HAZY (A) 06/23/2020 0518   LABSPEC 1.010 06/23/2020 0518   PHURINE 5.0 06/23/2020 0518   GLUCOSEU NEGATIVE 06/23/2020 0518   HGBUR SMALL (A) 06/23/2020 0518   BILIRUBINUR NEGATIVE 06/23/2020 0518   KETONESUR NEGATIVE 06/23/2020 0518   PROTEINUR 100 (A) 06/23/2020 0518   NITRITE NEGATIVE 06/23/2020 0518   LEUKOCYTESUR NEGATIVE 06/23/2020 0518   Sepsis Labs: @LABRCNTIP (procalcitonin:4,lacticidven:4)  ) Recent Results (from the past 240 hour(s))  SARS Coronavirus  2 by RT PCR (hospital order, performed in [place] hospital lab) Nasopharyngeal Nasopharyngeal Swab     Status: None   Collection Time: 06/23/20  1:18 AM   Specimen: Nasopharyngeal Swab  Result Value Ref Range Status   SARS Coronavirus 2 NEGATIVE NEGATIVE Final    Comment: (NOTE) SARS-CoV-2 target nucleic acids are NOT DETECTED.  The SARS-CoV-2 RNA is generally detectable in upper and lower respiratory specimens during the acute phase of infection. The lowest concentration of SARS-CoV-2 viral copies this assay can detect is 250 copies / mL. A negative result does not preclude SARS-CoV-2 infection and should not be used as the sole basis for treatment or other patient management decisions.  A negative result may occur with improper specimen collection / handling, submission of specimen other than nasopharyngeal swab, presence of viral mutation(s) within the areas targeted by this assay, and inadequate number of viral copies (<250 copies / mL). A negative result must be combined with clinical observations, patient history, and epidemiological information.  Fact Sheet for Patients:   StrictlyIdeas.no  Fact Sheet for Healthcare Providers: BankingDealers.co.za  This test is not yet approved or  cleared by the [place] FDA and has been authorized for detection and/or diagnosis of SARS-CoV-2 by FDA under an Emergency Use Authorization (EUA).  This EUA will remain in effect (meaning this test can be used) for the duration of the COVID-19 declaration under Section 564(b)(1) of the Act, 21 U.S.C. section 360bbb-3(b)(1), unless the authorization  is terminated or revoked sooner.  Performed at [facility], [place] [address] [place], [place] [address]   MRSA PCR Screening     Status: None   Collection Time: 06/23/20  9:50 PM   Specimen: Nasal Mucosa; Nasopharyngeal  Result Value Ref Range Status   MRSA by PCR NEGATIVE NEGATIVE  Final    Comment:        The GeneXpert MRSA Assay (FDA approved for NASAL specimens only), is one component of a comprehensive MRSA colonization surveillance program. It is not intended to diagnose MRSA infection nor to guide or monitor treatment for MRSA infections. Performed at [facility], [place] [address], [place], [place] [address]   Culture, respiratory (non-expectorated)     Status: None   Collection Time: 06/24/20 10:52 AM   Specimen: Tracheal Aspirate; Respiratory  Result Value Ref Range Status   Specimen Description TRACHEAL ASPIRATE  Final   Special Requests NONE  Final   Gram Stain   Final    ABUNDANT WBC PRESENT,BOTH PMN AND MONONUCLEAR MODERATE GRAM POSITIVE COCCI FEW GRAM NEGATIVE RODS RARE GRAM VARIABLE ROD    Culture   Final    FEW KLEBSIELLA PNEUMONIAE NO STAPHYLOCOCCUS AUREUS ISOLATED No Pseudomonas species isolated Performed at [facility], [place] [address], [place], [place] [address]    Report Status 06/26/2020 FINAL  Final   Organism ID, Bacteria KLEBSIELLA PNEUMONIAE  Final      Susceptibility   Klebsiella pneumoniae - MIC*    AMPICILLIN RESISTANT Resistant     CEFAZOLIN <=4 SENSITIVE Sensitive     CEFEPIME <=0.12 SENSITIVE Sensitive     CEFTAZIDIME <=1 SENSITIVE Sensitive     CEFTRIAXONE <=0.25 SENSITIVE Sensitive     CIPROFLOXACIN <=0.25 SENSITIVE Sensitive     GENTAMICIN <=1 SENSITIVE Sensitive     IMIPENEM <=0.25 SENSITIVE Sensitive     TRIMETH/SULFA <=20 SENSITIVE Sensitive     AMPICILLIN/SULBACTAM 4 SENSITIVE Sensitive     PIP/TAZO <=4 SENSITIVE Sensitive     * FEW KLEBSIELLA PNEUMONIAE      Studies: No results found.  Scheduled Meds: . amLODipine  5 mg Oral BID  . arformoterol  15 mcg Nebulization BID  . aspirin EC  81 mg Oral Daily  . atorvastatin  40 mg Oral Daily  . budesonide (PULMICORT) nebulizer solution  0.25 mg Nebulization BID  . chlorhexidine gluconate (MEDLINE KIT)  15 mL Mouth Rinse BID  . Chlorhexidine  Gluconate Cloth  6 each Topical Q0600  . cloNIDine  0.2 mg Oral QHS  . clopidogrel  75 mg Oral Daily  . digoxin  0.0625 mg Oral Daily  . doxycycline  100 mg Oral Q12H  . hydrALAZINE  50 mg Oral TID  . insulin aspart  0-9 Units Subcutaneous Q4H  . isosorbide mononitrate  120 mg Oral Daily  . mouth rinse  15 mL Mouth Rinse BID  . metoprolol succinate  50 mg Oral Daily  . pantoprazole  40 mg Oral QHS  . polyethylene glycol  17 g Oral Daily  . senna-docusate  2 tablet Oral BID    Continuous Infusions: . sodium chloride 10 mL/hr at 06/24/20 1500  . cefTRIAXone (ROCEPHIN)  IV 2 g (06/27/20 1117)  . heparin 1,600 Units/hr (06/27/20 0719)     LOS: 4 days     Kayleen Memos, MD [facility] Pager [phone]  If 7PM-7AM, please contact night-coverage [email or web address] Password [facility] 06/27/2020, 2:31 PM

## 2020-06-28 ENCOUNTER — Encounter (HOSPITAL_COMMUNITY): Payer: Self-pay | Admitting: Cardiovascular Disease

## 2020-06-28 ENCOUNTER — Encounter (HOSPITAL_COMMUNITY)
Admission: EM | Disposition: A | Discharge status: Home / Self Care | Payer: Self-pay | Source: Home / Self Care | Attending: Internal Medicine

## 2020-06-28 HISTORY — PX: CORONARY ANGIOGRAPHY: CATH118303

## 2020-06-28 LAB — CBC
HCT: 37.7 % — ABNORMAL LOW (ref 39.0–52.0)
Hemoglobin: 11.7 g/dL — ABNORMAL LOW (ref 13.0–17.0)
MCH: 27 pg (ref 26.0–34.0)
MCHC: 31 g/dL (ref 30.0–36.0)
MCV: 86.9 fL (ref 80.0–100.0)
Platelets: 167 10*3/uL (ref 150–400)
RBC: 4.34 MIL/uL (ref 4.22–5.81)
RDW: 13.2 % (ref 11.5–15.5)
WBC: 6.2 10*3/uL (ref 4.0–10.5)
nRBC: 0 % (ref 0.0–0.2)

## 2020-06-28 LAB — BASIC METABOLIC PANEL
Anion gap: 8 (ref 5–15)
BUN: 26 mg/dL — ABNORMAL HIGH (ref 8–23)
CO2: 24 mmol/L (ref 22–32)
Calcium: 9 mg/dL (ref 8.9–10.3)
Chloride: 105 mmol/L (ref 98–111)
Creatinine, Ser: 1.68 mg/dL — ABNORMAL HIGH (ref 0.61–1.24)
GFR calc Af Amer: 49 mL/min — ABNORMAL LOW (ref >60)
GFR calc non Af Amer: 42 mL/min — ABNORMAL LOW (ref >60)
Glucose, Bld: 98 mg/dL (ref 70–99)
Potassium: 4.3 mmol/L (ref 3.5–5.1)
Sodium: 137 mmol/L (ref 135–145)

## 2020-06-28 LAB — GLUCOSE, CAPILLARY
Glucose-Capillary: 104 mg/dL — ABNORMAL HIGH (ref 70–99)
Glucose-Capillary: 111 mg/dL — ABNORMAL HIGH (ref 70–99)
Glucose-Capillary: 136 mg/dL — ABNORMAL HIGH (ref 70–99)
Glucose-Capillary: 96 mg/dL (ref 70–99)
Glucose-Capillary: 99 mg/dL (ref 70–99)
Glucose-Capillary: 99 mg/dL (ref 70–99)

## 2020-06-28 LAB — HEPARIN LEVEL (UNFRACTIONATED): Heparin Unfractionated: 0.33 IU/mL (ref 0.30–0.70)

## 2020-06-28 LAB — TRIGLYCERIDES: Triglycerides: 83 mg/dL (ref <150)

## 2020-06-28 SURGERY — CORONARY ANGIOGRAPHY (CATH LAB)
Anesthesia: LOCAL

## 2020-06-28 MED ORDER — LABETALOL HCL 5 MG/ML IV SOLN: 10.0000 mg | INTRAVENOUS | Status: AC | PRN

## 2020-06-28 MED ORDER — INSULIN ASPART 100 UNIT/ML ~~LOC~~ SOLN: 0.0000 [IU] | Freq: Three times a day (TID) | SUBCUTANEOUS | Status: DC

## 2020-06-28 MED ORDER — HEPARIN (PORCINE) IN NACL 1000-0.9 UT/500ML-% IV SOLN
INTRAVENOUS | Status: AC
  Filled 2020-06-28: qty 1000

## 2020-06-28 MED ORDER — FENTANYL CITRATE (PF) 100 MCG/2ML IJ SOLN
INTRAMUSCULAR | Status: AC
  Filled 2020-06-28: qty 2

## 2020-06-28 MED ORDER — SODIUM CHLORIDE 0.9 % IV SOLN: INTRAVENOUS | Status: DC

## 2020-06-28 MED ORDER — SODIUM CHLORIDE 0.9% FLUSH: 3.0000 mL | INTRAVENOUS | Status: DC | PRN

## 2020-06-28 MED ORDER — ONDANSETRON HCL 4 MG/2ML IJ SOLN: 4.0000 mg | Freq: Four times a day (QID) | INTRAMUSCULAR | Status: DC | PRN

## 2020-06-28 MED ORDER — HEPARIN (PORCINE) IN NACL 1000-0.9 UT/500ML-% IV SOLN
INTRAVENOUS | Status: DC | PRN
  Administered 2020-06-28 (×2): 500 mL

## 2020-06-28 MED ORDER — LIDOCAINE HCL (PF) 1 % IJ SOLN
INTRAMUSCULAR | Status: AC
  Filled 2020-06-28: qty 30

## 2020-06-28 MED ORDER — SODIUM CHLORIDE 0.9 % IV SOLN: 250.0000 mL | INTRAVENOUS | Status: DC | PRN

## 2020-06-28 MED ORDER — SODIUM CHLORIDE 0.9% FLUSH: 3.0000 mL | Freq: Two times a day (BID) | INTRAVENOUS | Status: DC

## 2020-06-28 MED ORDER — FENTANYL CITRATE (PF) 100 MCG/2ML IJ SOLN
INTRAMUSCULAR | Status: DC | PRN
Start: 2020-06-28 — End: 2020-06-28
  Administered 2020-06-28: 25 ug via INTRAVENOUS

## 2020-06-28 MED ORDER — MIDAZOLAM HCL 2 MG/2ML IJ SOLN
INTRAMUSCULAR | Status: AC
  Filled 2020-06-28: qty 2

## 2020-06-28 MED ORDER — HYDRALAZINE HCL 20 MG/ML IJ SOLN: 10.0000 mg | INTRAMUSCULAR | Status: AC | PRN

## 2020-06-28 MED ORDER — SODIUM CHLORIDE 0.9 % IV SOLN: INTRAVENOUS | Status: AC

## 2020-06-28 MED ORDER — LIDOCAINE HCL (PF) 1 % IJ SOLN
INTRAMUSCULAR | Status: DC | PRN
  Administered 2020-06-28: 25 mL via SUBCUTANEOUS

## 2020-06-28 MED ORDER — IOHEXOL 350 MG/ML SOLN
INTRAVENOUS | Status: DC | PRN
  Administered 2020-06-28: 125 mL via INTRA_ARTERIAL

## 2020-06-28 MED ORDER — MIDAZOLAM HCL 2 MG/2ML IJ SOLN
INTRAMUSCULAR | Status: DC | PRN
  Administered 2020-06-28: 1 mg via INTRAVENOUS

## 2020-06-28 MED ORDER — ASPIRIN 81 MG PO CHEW
81.0000 mg | CHEWABLE_TABLET | ORAL | Status: AC
  Administered 2020-06-28: 81 mg via ORAL
  Filled 2020-06-28: qty 1

## 2020-06-28 MED ORDER — SODIUM CHLORIDE 0.9% FLUSH
3.0000 mL | Freq: Two times a day (BID) | INTRAVENOUS | Status: DC
  Administered 2020-06-28 – 2020-06-29 (×2): 3 mL via INTRAVENOUS

## 2020-06-28 MED ORDER — HYDRALAZINE HCL 20 MG/ML IJ SOLN
INTRAMUSCULAR | Status: AC
  Filled 2020-06-28: qty 1

## 2020-06-28 SURGICAL SUPPLY — 12 items
CATH INFINITI 5 FR AR1 MOD (CATHETERS) ×1 IMPLANT
CATH INFINITI 5FR AL1 (CATHETERS) ×1 IMPLANT
CATH INFINITI 5FR JL5 (CATHETERS) ×1 IMPLANT
CATH INFINITI 5FR MULTPACK ANG (CATHETERS) ×1 IMPLANT
CATH LAUNCHER 5F NOTO (CATHETERS) IMPLANT
CATHETER LAUNCHER 5F NOTO (CATHETERS) ×2
KIT HEART LEFT (KITS) ×2 IMPLANT
PACK CARDIAC CATHETERIZATION (CUSTOM PROCEDURE TRAY) ×2 IMPLANT
SHEATH PINNACLE 5F 10CM (SHEATH) ×1 IMPLANT
SYR MEDRAD MARK 7 150ML (SYRINGE) ×2 IMPLANT
TRANSDUCER W/STOPCOCK (MISCELLANEOUS) ×2 IMPLANT
WIRE EMERALD 3MM-J .035X150CM (WIRE) ×1 IMPLANT

## 2020-06-28 NOTE — Plan of Care (Signed)
Continue to monitor

## 2020-06-28 NOTE — Interval H&P Note (Signed)
History and Physical Interval Note:  06/28/2020 10:16 AM  Phillip Kennedy  has presented today for surgery, with the diagnosis of NSTEMI.  The various methods of treatment have been discussed with the patient and family. After consideration of risks, benefits and other options for treatment, the patient has consented to  Procedure(s): LEFT HEART CATH AND CORONARY ANGIOGRAPHY (N/A) as a surgical intervention.  The patient's history has been reviewed, patient examined, no change in status, stable for surgery.  I have reviewed the patient's chart and labs.  Questions were answered to the patient's satisfaction.     Ricki Rodriguez

## 2020-06-28 NOTE — Progress Notes (Signed)
5Fr sheath aspirated and removed from rfa, manual pressure applied for 20 minutes. Site level 0, no s+s of hematoma. Tegaderm dressing applied, bedrest instructions given.   Bilateral dp and pt pulses present with doppler.   Bedrest begins at 11:55:00

## 2020-06-28 NOTE — Progress Notes (Signed)
PROGRESS NOTE  Phillip Kennedy YOV:[id number] DOB: 1956/10/08 DOA: 06/23/2020 PCP: [name], MD  HPI/Recap of past 44 hours: 64 year old male with history of hypertension, COPD who came into the emergency room with shortness of breath.  He did not tolerate CPAP on arrival after being used by ~EMS and had to be emergently intubated on arrival to the emergency room.  Patient was severely hypertensive chest x-ray was showing bilateral pulmonary infiltrates worse on the right side.  He was started on nitroglycerin drip and was sedated.    Transferred to the ICU post intubation.  UDS positive for cocaine and benzodiazepine on 06/23/2020.  TRH assumed care on 06/26/2020.  Hospital course complicated by NSTEMI.  Seen by cardiology, plan for heart cath on 06/28/2020.  06/28/20: Seen and seen and examined.  He has no new complaints.  Denies chest pain or dyspnea at rest.  Plan for heart catheter today per cardiology.  Assessment/Plan: Active Problems:   Acute pulmonary edema (HCC)  Resolved acute Hypoxemic and Hypercapnic respiratory failure secondary to Klebsiella pneumonia community-acquired pneumonia, pulmonary edema from hypertensive emergency - Continue ceftriaxone + doxycycline for CAP to complete 5 days - Respiratory culture grew Klebsiella pneumonia resistant to ampicillin.   NSTEMI  In setting of hypertensive emergency and cocaine use Troponin S peaked at 2900 and trended down - Continue aspirin and atorvastatin -Received heparin drip per ACS protocol               -Planned cardiac cath 06/28/2020. -Care directed by cardiology  Klebsiella community-acquired pneumoniae Respiratory culture grew Klebsiella pneumonia, sensitivities showing resistance for ampicillin Continue Rocephin and doxycycline x5 days Symptomatology is improving  Improved dyspnea, suspect cardiac etiology Continue to treat underlying conditions  Hypertensive Emergency BP is now stable. - Initially required nitro  gtt and has been weaned off antihypertensive agents at this time - Continue home amlodipine and hydralazine per cardiology  Acute on Chronic  combined systolic and diastolic congestive Heart Failure  In setting of hypertensive emergency 2D echo done on 06/23/2020 showed LVEF 25 to 30% with grade 1 diastolic dysfunction, left atrial size was moderately dilated, left ventricle demonstrates global hypokinesis. Continue strict I's and O's and daily weight Net I&O -1.8 L  AKI on CKD 3A  Baseline creatinine 1.5 with GFR 54 Currently creatinine is uptrending 1.6 with GFR 49, creatinine peaked at 3.09 with GFR 23. Pre-cath IV fluid normal saline ordered Continue to avoid nephrotoxins Continue to monitor urine output Repeat BMP in the morning Renal US shows medical renal disease. No hydronephrosis. Bilateral renal cysts. Left kidney contains echogenic calcification or nodule which may require further evaluation with CT. -Continue to trend renal function  COPD, not in acute exacerbation Continue bronchodilators  Constipation Continue Senokot 2 tablet twice daily and daily MiraLAX   Code Status: Full code  Family Communication: Wife at bedside.  Disposition Plan:     Consultants:  Cardiology  Procedures:  None  Antimicrobials:  Rocephin  Doxycycline  DVT prophylaxis: Heparin drip  Status is: Inpatient   Dispo: The patient is from: Home               Anticipated d/c is to: Home               Anticipated d/c date is: 06/29/2020               Patient currently not stable for discharge due to planned heart cath today.      Objective: Vitals:  06/28/20 1140 06/28/20 1145 06/28/20 1150 06/28/20 1155  BP: (!) 158/97 (!) 137/96 (!) 165/97 (!) 152/99  Pulse:      Resp: 16 (!) 9 15 19   Temp:      TempSrc:      SpO2:  98% 100% 95%  Weight:      Height:        Intake/Output Summary (Last 24 hours) at 06/28/2020 1517 Last data filed at 06/28/2020 1506 Gross per 24  hour  Intake --  Output 800 ml  Net -800 ml   Filed Weights   06/26/20 0658 06/27/20 0536 06/28/20 0626  Weight: 93.5 kg 88.7 kg 88.9 kg    Exam:   General: 64 y.o. year-old male well-developed well-nourished no acute distress.  Alert and oriented x3.  Cardiovascular: Regular rate and rhythm no rubs or gallops.  Respiratory: Clear to auscultation no wheezes or rales.    Abdomen: Soft nontender normal bowel sounds present.    Musculoskeletal: Trace lower extremity edema bilaterally.    Psychiatry: Mood is appropriate for condition and setting.  Data Reviewed: CBC: Recent Labs  Lab 06/23/20 0110 06/23/20 0137 06/23/20 0424 06/23/20 2209 06/24/20 0040 06/24/20 0040 06/24/20 4827 06/25/20 0303 06/26/20 0721 06/27/20 0239 06/28/20 0333  WBC 5.5   < > 8.4  --  7.8  --   --  7.3 6.2 5.3 6.2  NEUTROABS 2.3  --  7.2  --   --   --   --   --   --   --   --   HGB 14.2   < > 13.5   < > 11.8*   < > 11.2* 11.6* 11.7* 11.9* 11.7*  HCT 47.8   < > 45.3   < > 39.2   < > 33.0* 37.0* 35.9* 37.2* 37.7*  MCV 91.0   < > 91.9  --  89.5  --   --  88.7 86.3 84.5 86.9  PLT 221   < > 170  --  175  --   --  155 152 145* 167   < > = values in this interval not displayed.   Basic Metabolic Panel: Recent Labs  Lab  0000 06/23/20 1001 06/23/20 2100 06/23/20 2209 06/24/20 0040 06/24/20 0040 06/24/20 0786 06/24/20 1535 06/25/20 0303 06/26/20 0721 06/27/20 0239 06/28/20 0333  NA  --   --   --    < > 141   < > 141  --  138 140 136 137  K  --   --   --    < > 5.1   < > 4.4  --  3.8 4.1 4.0 4.3  CL   < >  --   --   --  109  --   --   --  106 108 104 105  CO2   < >  --   --   --  19*  --   --   --  22 23 25 24   GLUCOSE   < >  --   --   --  153*  --   --   --  100* 123* 93 98  BUN   < >  --   --   --  35*  --   --   --  52* 29* 20 26*  CREATININE   < >  --   --   --  2.79*  --   --   --  3.09* 1.87* 1.54* 1.68*  CALCIUM   < >  --   --   --  9.2  --   --   --  8.8* 9.2 9.1 9.0  MG  --   2.0 2.0  --  2.1  --   --  2.2  --   --   --   --   PHOS  --  3.5 4.6  --  4.9*  --   --  5.0*  --   --   --   --    < > = values in this interval not displayed.   GFR: Estimated Creatinine Clearance: 49.9 mL/min (A) (by C-G formula based on SCr of 1.68 mg/dL (H)). Liver Function Tests: Recent Labs  Lab 06/23/20 0110  AST 59*  ALT 59*  ALKPHOS 156*  BILITOT 0.6  PROT 7.4  ALBUMIN 3.6   No results for input(s): LIPASE, AMYLASE in the last 168 hours. No results for input(s): AMMONIA in the last 168 hours. Coagulation Profile: No results for input(s): INR, PROTIME in the last 168 hours. Cardiac Enzymes: No results for input(s): CKTOTAL, CKMB, CKMBINDEX, TROPONINI in the last 168 hours. BNP (last 3 results) No results for input(s): PROBNP in the last 8760 hours. HbA1C: No results for input(s): HGBA1C in the last 72 hours. CBG: Recent Labs  Lab 06/27/20 2133 06/28/20 0044 06/28/20 0453 06/28/20 0828 06/28/20 1212  GLUCAP 116* 104* 96 99 99   Lipid Profile: Recent Labs    06/27/20 0239 06/28/20 0333  TRIG 101 83   Thyroid Function Tests: No results for input(s): TSH, T4TOTAL, FREET4, T3FREE, THYROIDAB in the last 72 hours. Anemia Panel: No results for input(s): VITAMINB12, FOLATE, FERRITIN, TIBC, IRON, RETICCTPCT in the last 72 hours. Urine analysis:    Component Value Date/Time   COLORURINE YELLOW 06/23/2020 0518   APPEARANCEUR HAZY (A) 06/23/2020 0518   LABSPEC 1.010 06/23/2020 0518   PHURINE 5.0 06/23/2020 0518   GLUCOSEU NEGATIVE 06/23/2020 0518   HGBUR SMALL (A) 06/23/2020 0518   BILIRUBINUR NEGATIVE 06/23/2020 0518   KETONESUR NEGATIVE 06/23/2020 0518   PROTEINUR 100 (A) 06/23/2020 0518   NITRITE NEGATIVE 06/23/2020 0518   LEUKOCYTESUR NEGATIVE 06/23/2020 0518   Sepsis Labs: @LABRCNTIP (procalcitonin:4,lacticidven:4)  ) Recent Results (from the past 240 hour(s))  SARS Coronavirus 2 by RT PCR (hospital order, performed in [place] hospital lab)  Nasopharyngeal Nasopharyngeal Swab     Status: None   Collection Time: 06/23/20  1:18 AM   Specimen: Nasopharyngeal Swab  Result Value Ref Range Status   SARS Coronavirus 2 NEGATIVE NEGATIVE Final    Comment: (NOTE) SARS-CoV-2 target nucleic acids are NOT DETECTED.  The SARS-CoV-2 RNA is generally detectable in upper and lower respiratory specimens during the acute phase of infection. The lowest concentration of SARS-CoV-2 viral copies this assay can detect is 250 copies / mL. A negative result does not preclude SARS-CoV-2 infection and should not be used as the sole basis for treatment or other patient management decisions.  A negative result may occur with improper specimen collection / handling, submission of specimen other than nasopharyngeal swab, presence of viral mutation(s) within the areas targeted by this assay, and inadequate number of viral copies (<250 copies / mL). A negative result must be combined with clinical observations, patient history, and epidemiological information.  Fact Sheet for Patients:   StrictlyIdeas.no  Fact Sheet for Healthcare Providers: BankingDealers.co.za  This test is not yet approved or  cleared by the [place] FDA and has been authorized for  detection and/or diagnosis of SARS-CoV-2 by FDA under an Emergency Use Authorization (EUA).  This EUA will remain in effect (meaning this test can be used) for the duration of the COVID-19 declaration under Section 564(b)(1) of the Act, 21 U.S.C. section 360bbb-3(b)(1), unless the authorization is terminated or revoked sooner.  Performed at [facility], [place] [address] [place], [place] [address]   MRSA PCR Screening     Status: None   Collection Time: 06/23/20  9:50 PM   Specimen: Nasal Mucosa; Nasopharyngeal  Result Value Ref Range Status   MRSA by PCR NEGATIVE NEGATIVE Final    Comment:        The GeneXpert MRSA Assay (FDA approved for  NASAL specimens only), is one component of a comprehensive MRSA colonization surveillance program. It is not intended to diagnose MRSA infection nor to guide or monitor treatment for MRSA infections. Performed at [facility], [place] [address] [place], [place] [address]   Culture, respiratory (non-expectorated)     Status: None   Collection Time: 06/24/20 10:52 AM   Specimen: Tracheal Aspirate; Respiratory  Result Value Ref Range Status   Specimen Description TRACHEAL ASPIRATE  Final   Special Requests NONE  Final   Gram Stain   Final    ABUNDANT WBC PRESENT,BOTH PMN AND MONONUCLEAR MODERATE GRAM POSITIVE COCCI FEW GRAM NEGATIVE RODS RARE GRAM VARIABLE ROD    Culture   Final    FEW KLEBSIELLA PNEUMONIAE NO STAPHYLOCOCCUS AUREUS ISOLATED No Pseudomonas species isolated Performed at [facility], [place] [address] [place], [place] [address]    Report Status 06/26/2020 FINAL  Final   Organism ID, Bacteria KLEBSIELLA PNEUMONIAE  Final      Susceptibility   Klebsiella pneumoniae - MIC*    AMPICILLIN RESISTANT Resistant     CEFAZOLIN <=4 SENSITIVE Sensitive     CEFEPIME <=0.12 SENSITIVE Sensitive     CEFTAZIDIME <=1 SENSITIVE Sensitive     CEFTRIAXONE <=0.25 SENSITIVE Sensitive     CIPROFLOXACIN <=0.25 SENSITIVE Sensitive     GENTAMICIN <=1 SENSITIVE Sensitive     IMIPENEM <=0.25 SENSITIVE Sensitive     TRIMETH/SULFA <=20 SENSITIVE Sensitive     AMPICILLIN/SULBACTAM 4 SENSITIVE Sensitive     PIP/TAZO <=4 SENSITIVE Sensitive     * FEW KLEBSIELLA PNEUMONIAE  SARS Coronavirus 2 by RT PCR (hospital order, performed in [place] hospital lab) Nasopharyngeal Nasopharyngeal Swab     Status: None   Collection Time: 06/27/20  6:54 PM   Specimen: Nasopharyngeal Swab  Result Value Ref Range Status   SARS Coronavirus 2 NEGATIVE NEGATIVE Final    Comment: (NOTE) SARS-CoV-2 target nucleic acids are NOT DETECTED.  The SARS-CoV-2 RNA is generally detectable in upper and  lower respiratory specimens during the acute phase of infection. The lowest concentration of SARS-CoV-2 viral copies this assay can detect is 250 copies / mL. A negative result does not preclude SARS-CoV-2 infection and should not be used as the sole basis for treatment or other patient management decisions.  A negative result may occur with improper specimen collection / handling, submission of specimen other than nasopharyngeal swab, presence of viral mutation(s) within the areas targeted by this assay, and inadequate number of viral copies (<250 copies / mL). A negative result must be combined with clinical observations, patient history, and epidemiological information.  Fact Sheet for Patients:   StrictlyIdeas.no  Fact Sheet for Healthcare Providers: BankingDealers.co.za  This test is not yet approved or  cleared by  the [employer] and has been authorized for detection and/or diagnosis of SARS-CoV-2 by FDA under an Emergency Use Authorization (EUA).  This EUA will remain in effect (meaning this test can be used) for the duration of the COVID-19 declaration under Section 564(b)(1) of the Act, 21 U.S.C. section 360bbb-3(b)(1), unless the authorization is terminated or revoked sooner.  Performed at [facility], [place] [address] [place],  [address]       Studies: CARDIAC CATHETERIZATION  Result Date: 06/28/2020  Prox RCA lesion is 40% stenosed.  Prox LAD lesion is 30% stenosed.  Mid Cx to Dist Cx lesion is 40% stenosed.  1st Diag lesion is 30% stenosed.  2nd Diag lesion is 50% stenosed.  Dist LAD lesion is 50% stenosed.  Medical treatment with life style modification.    Scheduled Meds:  amLODipine  5 mg Oral BID   arformoterol  15 mcg Nebulization BID   aspirin EC  81 mg Oral Daily   atorvastatin  40 mg Oral Daily   budesonide (PULMICORT) nebulizer solution  0.25 mg Nebulization BID    chlorhexidine gluconate (MEDLINE KIT)  15 mL Mouth Rinse BID   Chlorhexidine Gluconate Cloth  6 each Topical Q0600   cloNIDine  0.2 mg Oral QHS   clopidogrel  75 mg Oral Daily   digoxin  0.0625 mg Oral Daily   doxycycline  100 mg Oral Q12H   hydrALAZINE  50 mg Oral TID   insulin aspart  0-9 Units Subcutaneous Q4H   isosorbide mononitrate  120 mg Oral Daily   loratadine  10 mg Oral Daily   mouth rinse  15 mL Mouth Rinse BID   metoprolol succinate  50 mg Oral Daily   pantoprazole  40 mg Oral QHS   polyethylene glycol  17 g Oral Daily   senna-docusate  2 tablet Oral BID   sodium chloride flush  3 mL Intravenous Q12H    Continuous Infusions:  sodium chloride 10 mL/hr at 06/24/20 1500   sodium chloride     sodium chloride     cefTRIAXone (ROCEPHIN)  IV 2 g (06/27/20 1117)     LOS: 5 days     Kayleen Memos, MD [facility] Pager [phone]  If 7PM-7AM, please contact night-coverage [email or web address] Password [facility] 06/28/2020, 3:17 PM

## 2020-06-28 NOTE — Progress Notes (Signed)
Patient arrived to room at this time from cath lab. Right groin clean dry and intact. Level 0.  V/s and assessment complete. Call bell in reach. SO at bedside.

## 2020-06-29 LAB — BASIC METABOLIC PANEL
Anion gap: 6 (ref 5–15)
BUN: 27 mg/dL — ABNORMAL HIGH (ref 8–23)
CO2: 23 mmol/L (ref 22–32)
Calcium: 9.2 mg/dL (ref 8.9–10.3)
Chloride: 109 mmol/L (ref 98–111)
Creatinine, Ser: 1.63 mg/dL — ABNORMAL HIGH (ref 0.61–1.24)
GFR calc Af Amer: 51 mL/min — ABNORMAL LOW (ref >60)
GFR calc non Af Amer: 44 mL/min — ABNORMAL LOW (ref >60)
Glucose, Bld: 101 mg/dL — ABNORMAL HIGH (ref 70–99)
Potassium: 4.4 mmol/L (ref 3.5–5.1)
Sodium: 138 mmol/L (ref 135–145)

## 2020-06-29 LAB — CBC
HCT: 36.2 % — ABNORMAL LOW (ref 39.0–52.0)
Hemoglobin: 11.3 g/dL — ABNORMAL LOW (ref 13.0–17.0)
MCH: 26.9 pg (ref 26.0–34.0)
MCHC: 31.2 g/dL (ref 30.0–36.0)
MCV: 86.2 fL (ref 80.0–100.0)
Platelets: 158 10*3/uL (ref 150–400)
RBC: 4.2 MIL/uL — ABNORMAL LOW (ref 4.22–5.81)
RDW: 13.4 % (ref 11.5–15.5)
WBC: 5 10*3/uL (ref 4.0–10.5)
nRBC: 0 % (ref 0.0–0.2)

## 2020-06-29 LAB — GLUCOSE, CAPILLARY
Glucose-Capillary: 100 mg/dL — ABNORMAL HIGH (ref 70–99)
Glucose-Capillary: 95 mg/dL (ref 70–99)
Glucose-Capillary: 95 mg/dL (ref 70–99)

## 2020-06-29 MED ORDER — CLOPIDOGREL BISULFATE 75 MG PO TABS: 75.0000 mg | ORAL_TABLET | Freq: Every day | ORAL | 1 refills | Status: AC

## 2020-06-29 MED ORDER — CEFDINIR 300 MG PO CAPS: 300.0000 mg | ORAL_CAPSULE | Freq: Two times a day (BID) | ORAL | 0 refills | Status: AC

## 2020-06-29 MED ORDER — AMLODIPINE BESYLATE 5 MG PO TABS: 5.0000 mg | ORAL_TABLET | Freq: Every day | ORAL | 0 refills | Status: DC

## 2020-06-29 MED ORDER — ISOSORBIDE MONONITRATE ER 120 MG PO TB24: 120.0000 mg | ORAL_TABLET | Freq: Every day | ORAL | 0 refills | Status: DC

## 2020-06-29 MED ORDER — METOPROLOL SUCCINATE ER 50 MG PO TB24
50.0000 mg | ORAL_TABLET | Freq: Every day | ORAL | 0 refills | Status: DC
Start: 2020-06-30 — End: 2020-12-22

## 2020-06-29 MED ORDER — HYDRALAZINE HCL 25 MG PO TABS: 25.0000 mg | ORAL_TABLET | Freq: Three times a day (TID) | ORAL | 0 refills | Status: DC

## 2020-06-29 MED ORDER — HYDRALAZINE HCL 50 MG PO TABS
50.0000 mg | ORAL_TABLET | Freq: Three times a day (TID) | ORAL | 0 refills | Status: DC
Start: 2020-06-29 — End: 2022-07-15

## 2020-06-29 NOTE — Plan of Care (Signed)
Continue to monitor

## 2020-06-29 NOTE — Plan of Care (Signed)
Discharge to home °

## 2020-06-29 NOTE — Consult Note (Signed)
Ref: [name], MD   Subjective:  Awake. VS stable. BP and blood sugars are under control.  Objective:  Vital Signs in the last 24 hours: Temp:  [97.6 F (36.4 C)-98 F (36.7 C)] 98 F (36.7 C) (09/08 0357) Pulse Rate:  [64-78] 65 (09/08 0357) Cardiac Rhythm: Normal sinus rhythm (09/07 2015) Resp:  [9-21] 17 (09/08 0357) BP: (136-178)/(82-106) 136/87 (09/08 0357) SpO2:  [95 %-100 %] 100 % (09/08 0357) Weight:  [89 kg] 89 kg (09/08 0358)  Physical Exam: BP Readings from Last 1 Encounters:  06/29/20 136/87     Wt Readings from Last 1 Encounters:  06/29/20 89 kg    Weight change: 0.091 kg Body mass index is 28.14 kg/m. HEENT: [place]/AT, Eyes-Brown, Conjunctiva-Pink, Sclera-Non-icteric Neck: No JVD, No bruit, Trachea midline. Lungs:  Clear, Bilateral. Cardiac:  Regular rhythm, normal S1 and S2, no S3. II/VI systolic murmur. Abdomen:  Soft, non-tender. BS present. Extremities:  No edema present. No cyanosis. No clubbing. No right groin hematoma or tenderness. CNS: AxOx3, Cranial nerves grossly intact, moves all 4 extremities.  Skin: Warm and dry.   Intake/Output from previous day: 09/07 0701 - 09/08 0700 In: 393.3 [I.V.:393.3] Out: 1600 [Urine:1600]    Lab Results: BMET    Component Value Date/Time   NA 138 06/29/2020 0530   NA 137 06/28/2020 0333   NA 136 06/27/2020 0239   K 4.4 06/29/2020 0530   K 4.3 06/28/2020 0333   K 4.0 06/27/2020 0239   CL 109 06/29/2020 0530   CL 105 06/28/2020 0333   CL 104 06/27/2020 0239   CO2 23 06/29/2020 0530   CO2 24 06/28/2020 0333   CO2 25 06/27/2020 0239   GLUCOSE 101 (H) 06/29/2020 0530   GLUCOSE 98 06/28/2020 0333   GLUCOSE 93 06/27/2020 0239   BUN 27 (H) 06/29/2020 0530   BUN 26 (H) 06/28/2020 0333   BUN 20 06/27/2020 0239   CREATININE 1.63 (H) 06/29/2020 0530   CREATININE 1.68 (H) 06/28/2020 0333   CREATININE 1.54 (H) 06/27/2020 0239   CALCIUM 9.2 06/29/2020 0530   CALCIUM 9.0 06/28/2020 0333   CALCIUM 9.1  06/27/2020 0239   GFRNONAA 44 (L) 06/29/2020 0530   GFRNONAA 42 (L) 06/28/2020 0333   GFRNONAA 47 (L) 06/27/2020 0239   GFRAA 51 (L) 06/29/2020 0530   GFRAA 49 (L) 06/28/2020 0333   GFRAA 54 (L) 06/27/2020 0239   CBC    Component Value Date/Time   WBC 5.0 06/29/2020 0530   RBC 4.20 (L) 06/29/2020 0530   HGB 11.3 (L) 06/29/2020 0530   HCT 36.2 (L) 06/29/2020 0530   PLT 158 06/29/2020 0530   MCV 86.2 06/29/2020 0530   MCH 26.9 06/29/2020 0530   MCHC 31.2 06/29/2020 0530   RDW 13.4 06/29/2020 0530   LYMPHSABS 0.7 06/23/2020 0424   MONOABS 0.3 06/23/2020 0424   EOSABS 0.0 06/23/2020 0424   BASOSABS 0.0 06/23/2020 0424   HEPATIC Function Panel Recent Labs    06/23/20 0110  PROT 7.4   HEMOGLOBIN A1C No components found for: HGA1C,  MPG CARDIAC ENZYMES No results found for: CKTOTAL, CKMB, CKMBINDEX, TROPONINI BNP No results for input(s): PROBNP in the last 8760 hours. TSH No results for input(s): TSH in the last 8760 hours. CHOLESTEROL No results for input(s): CHOL in the last 8760 hours.  Scheduled Meds: . amLODipine  5 mg Oral BID  . arformoterol  15 mcg Nebulization BID  . aspirin EC  81 mg Oral Daily  . atorvastatin  40 mg Oral Daily  . budesonide (PULMICORT) nebulizer solution  0.25 mg Nebulization BID  . chlorhexidine gluconate (MEDLINE KIT)  15 mL Mouth Rinse BID  . Chlorhexidine Gluconate Cloth  6 each Topical Q0600  . cloNIDine  0.2 mg Oral QHS  . clopidogrel  75 mg Oral Daily  . digoxin  0.0625 mg Oral Daily  . doxycycline  100 mg Oral Q12H  . hydrALAZINE  50 mg Oral TID  . insulin aspart  0-9 Units Subcutaneous TID WC  . isosorbide mononitrate  120 mg Oral Daily  . loratadine  10 mg Oral Daily  . mouth rinse  15 mL Mouth Rinse BID  . metoprolol succinate  50 mg Oral Daily  . pantoprazole  40 mg Oral QHS  . polyethylene glycol  17 g Oral Daily  . senna-docusate  2 tablet Oral BID  . sodium chloride flush  3 mL Intravenous Q12H   Continuous  Infusions: . sodium chloride 10 mL/hr at 06/24/20 1500  . sodium chloride    . cefTRIAXone (ROCEPHIN)  IV 2 g (06/27/20 1117)   PRN Meds:.sodium chloride, sodium chloride, acetaminophen, albuterol, ondansetron (ZOFRAN) IV, polyethylene glycol, sodium chloride flush  Assessment/Plan: Acute NSTEMI with cocaine abuse Multivessel native vessel CAD Acute on chronic systolic left heart failure, HFrEF COPD HTN Type 2 DM Obesity CKD, II  Increase activity. May discharge home.  Patient agrees to follow heart healthy/Carb modified diet, exercise and take medications regularly. F/U in 1 week.   LOS: 6 days   Time spent including chart review, lab review, examination, discussion with patient : 30 min   [name]  MD  06/29/2020, 8:11 AM

## 2020-06-29 NOTE — Progress Notes (Signed)
IV and telemetry discontinued. Discharge instructions reviewed with patient. All questions answered.

## 2020-06-29 NOTE — Discharge Summary (Signed)
Physician Discharge Summary  Phillip Kennedy VVO:[id number] DOB: Dec 31, 1955 DOA: 06/23/2020  PCP: Orpah Cobb, MD  Admit date: 06/23/2020 Discharge date: 06/29/2020  Admitted From: home Disposition:  home  Recommendations for Outpatient Follow-up:  1. Follow up with PCP in 1-2 weeks 2. Please obtain BMP/CBC in one week 3. Please follow up on the following pending results:  Home Health:no  Equipment/Devices: none  Discharge Condition: Stable Code Status:   Code Status: Full Code Diet recommendation:  Diet Order            Diet - low sodium heart healthy           Diet Heart Room service appropriate? Yes; Fluid consistency: Thin  Diet effective now                  Brief/Interim Summary:  64 year old male with history of hypertension, COPD who came into the emergency room with shortness of breath.  He did not tolerate CPAP on arrival after being used by ~EMS and had to be emergently intubated on arrival to the emergency room. Patient was severely hypertensive chest x-ray was showing bilateral pulmonary infiltrates worse on the right side.  He was started on nitroglycerin drip and was sedated.   Transferred to the ICU post intubation. UDS positive for cocaine and benzodiazepine on 06/23/2020. TRH assumed care on 06/26/2020. Hospital course complicated by NSTEMI.  Seen by cardiology, plan for heart cath on 06/28/2020. Cath no acute findings and advised medical management. Seen by cardiology this morning and okay to discharge home on aspirin Plavix Imdur Toprol and Dr Riki Altes will follow-up as outpatient  Discharge Diagnoses:   NSTEMI: Status post cardiac cath plan is for medical management aspirin, Lipitor, Plavix, Imdur, Toprol.  Follow-up with cardiology as outpatient.  Klebsiella community-acquired pneumonia patient continued on the antibiotic for 2 more days and stop.  Dyspnea likely from non-ST elevation MI, improved.  No more shortness of breath  Hypertensive emergency blood  pressure is stabilized we will continue on adjusted Imdur, hydralazine, amlodipine, continue home clonidine.  Count METOPROLOL as per cardiology,  Acute on chronic combined systolic and diastolic CHF Echo with EF 25 to 30% and grade 1 diastolic dysfunction.  Currently euvolemic.  Has elevated creatinine not on Lasix, will follow up with cardiology for further management is advised to monitor intake output and daily weight.  AKI on CKD stage III, baseline creatinine around 1.5.  He is advised to follow-up with cardiology or PCP to monitor renal function next 3 to 5 days.  Renal ultrasound showed medical renal disease, left kidney contains echogenic calcification are noted which may require further evaluation with CT scan as outpatient. Recent Labs  Lab 06/25/20 0303 06/26/20 0721 06/27/20 0239 06/28/20 0333 06/29/20 0530  BUN 52* 29* 20 26* 27*  CREATININE 3.09* 1.87* 1.54* 1.68* 1.63*   Constipation resolved COPD not in acute exacerbation  Consults:  Cardiology  Subjective: Resting comfortably on room air.  Reports he saw his cardiologist this morning and is ready for home today.  Discharge Exam: Vitals:   06/29/20 1056 06/29/20 1100  BP: (!) 141/90 138/81  Pulse:    Resp: 16 20  Temp: 98 F (36.7 C)   SpO2:     General: Pt is alert, awake, not in acute distress Cardiovascular: RRR, S1/S2 +, no rubs, no gallops Respiratory: CTA bilaterally, no wheezing, no rhonchi Abdominal: Soft, NT, ND, bowel sounds + Extremities: no edema, no cyanosis  Discharge Instructions  Discharge Instructions  Diet - low sodium heart healthy   Complete by: As directed    Discharge instructions   Complete by: As directed    Please call call MD or return to ER for similar or worsening recurring problem that brought you to hospital or if any fever,nausea/vomiting,abdominal pain, uncontrolled pain, chest pain,  shortness of breath or any other alarming symptoms.  Please follow-up your doctor  as instructed in a week time and  get BMP done for your kidney function monitoring  Please avoid alcohol, smoking, or any other illicit substance and maintain healthy habits including taking your regular medications as prescribed.  You were cared for by a hospitalist during your hospital stay. If you have any questions about your discharge medications or the care you received while you were in the hospital after you are discharged, you can call the unit and ask to speak with the hospitalist on call if the hospitalist that took care of you is not available.  Once you are discharged, your primary care physician will handle any further medical issues. Please note that NO REFILLS for any discharge medications will be authorized once you are discharged, as it is imperative that you return to your primary care physician (or establish a relationship with a primary care physician if you do not have one) for your aftercare needs so that they can reassess your need for medications and monitor your lab values   Increase activity slowly   Complete by: As directed      Allergies as of 06/29/2020   No Known Allergies     Medication List    STOP taking these medications   metoprolol tartrate 25 MG tablet Commonly known as: LOPRESSOR     TAKE these medications   allopurinol 100 MG tablet Commonly known as: ZYLOPRIM Take 100 mg by mouth as needed (gout).   amLODipine 5 MG tablet Commonly known as: NORVASC Take 1 tablet (5 mg total) by mouth daily.   aspirin EC 81 MG tablet Take 81 mg by mouth daily. Swallow whole.   atorvastatin 40 MG tablet Commonly known as: LIPITOR Take 40 mg by mouth every evening.   cefdinir 300 MG capsule Commonly known as: OMNICEF Take 1 capsule (300 mg total) by mouth 2 (two) times daily for 2 doses.   cloNIDine 0.2 MG tablet Commonly known as: CATAPRES Take 0.2 mg by mouth at bedtime.   clopidogrel 75 MG tablet Commonly known as: PLAVIX Take 1 tablet (75 mg  total) by mouth daily. Start taking on: June 30, 2020   FISH OIL PO Take 1 capsule by mouth daily.   hydrALAZINE 50 MG tablet Commonly known as: APRESOLINE Take 1 tablet (50 mg total) by mouth 3 (three) times daily. What changed:   medication strength  how much to take   isosorbide mononitrate 120 MG 24 hr tablet Commonly known as: IMDUR Take 1 tablet (120 mg total) by mouth daily. Start taking on: June 30, 2020   metoprolol succinate 50 MG 24 hr tablet Commonly known as: TOPROL-XL Take 1 tablet (50 mg total) by mouth daily. Take with or immediately following a meal. Start taking on: June 30, 2020   ProAir HFA 108 (90 Base) MCG/ACT inhaler Generic drug: albuterol Inhale 2 puffs into the lungs every 4 (four) hours as needed for shortness of breath.       Follow-up Information    Orpah Cobb, MD. Schedule an appointment as soon as possible for a visit in 1 week(s).  Specialty: Cardiology Contact information: [address] [place] [address] [phone]              No Known Allergies  The results of significant diagnostics from this hospitalization (including imaging, microbiology, ancillary and laboratory) are listed below for reference.    Microbiology: Recent Results (from the past 240 hour(s))  SARS Coronavirus 2 by RT PCR (hospital order, performed in [facility] hospital lab) Nasopharyngeal Nasopharyngeal Swab     Status: None   Collection Time: 06/23/20  1:18 AM   Specimen: Nasopharyngeal Swab  Result Value Ref Range Status   SARS Coronavirus 2 NEGATIVE NEGATIVE Final    Comment: (NOTE) SARS-CoV-2 target nucleic acids are NOT DETECTED.  The SARS-CoV-2 RNA is generally detectable in upper and lower respiratory specimens during the acute phase of infection. The lowest concentration of SARS-CoV-2 viral copies this assay can detect is 250 copies / mL. A negative result does not preclude SARS-CoV-2 infection and should not be  used as the sole basis for treatment or other patient management decisions.  A negative result may occur with improper specimen collection / handling, submission of specimen other than nasopharyngeal swab, presence of viral mutation(s) within the areas targeted by this assay, and inadequate number of viral copies (<250 copies / mL). A negative result must be combined with clinical observations, patient history, and epidemiological information.  Fact Sheet for Patients:   BoilerBrush.com.cy  Fact Sheet for Healthcare Providers: https://pope.com/  This test is not yet approved or  cleared by the [place] FDA and has been authorized for detection and/or diagnosis of SARS-CoV-2 by FDA under an Emergency Use Authorization (EUA).  This EUA will remain in effect (meaning this test can be used) for the duration of the COVID-19 declaration under Section 564(b)(1) of the Act, 21 U.S.C. section 360bbb-3(b)(1), unless the authorization is terminated or revoked sooner.  Performed at [facility], [address], [place], [place] [address]   MRSA PCR Screening     Status: None   Collection Time: 06/23/20  9:50 PM   Specimen: Nasal Mucosa; Nasopharyngeal  Result Value Ref Range Status   MRSA by PCR NEGATIVE NEGATIVE Final    Comment:        The GeneXpert MRSA Assay (FDA approved for NASAL specimens only), is one component of a comprehensive MRSA colonization surveillance program. It is not intended to diagnose MRSA infection nor to guide or monitor treatment for MRSA infections. Performed at [facility], [address] [place], [place] [address]   Culture, respiratory (non-expectorated)     Status: None   Collection Time: 06/24/20 10:52 AM   Specimen: Tracheal Aspirate; Respiratory  Result Value Ref Range Status   Specimen Description TRACHEAL ASPIRATE  Final   Special Requests NONE  Final   Gram Stain   Final     ABUNDANT WBC PRESENT,BOTH PMN AND MONONUCLEAR MODERATE GRAM POSITIVE COCCI FEW GRAM NEGATIVE RODS RARE GRAM VARIABLE ROD    Culture   Final    FEW KLEBSIELLA PNEUMONIAE NO STAPHYLOCOCCUS AUREUS ISOLATED No Pseudomonas species isolated Performed at [facility], [address], [place], [place] [address]    Report Status 06/26/2020 FINAL  Final   Organism ID, Bacteria KLEBSIELLA PNEUMONIAE  Final      Susceptibility   Klebsiella pneumoniae - MIC*    AMPICILLIN RESISTANT Resistant     CEFAZOLIN <=4 SENSITIVE Sensitive     CEFEPIME <=0.12 SENSITIVE Sensitive  CEFTAZIDIME <=1 SENSITIVE Sensitive     CEFTRIAXONE <=0.25 SENSITIVE Sensitive     CIPROFLOXACIN <=0.25 SENSITIVE Sensitive     GENTAMICIN <=1 SENSITIVE Sensitive     IMIPENEM <=0.25 SENSITIVE Sensitive     TRIMETH/SULFA <=20 SENSITIVE Sensitive     AMPICILLIN/SULBACTAM 4 SENSITIVE Sensitive     PIP/TAZO <=4 SENSITIVE Sensitive     * FEW KLEBSIELLA PNEUMONIAE  SARS Coronavirus 2 by RT PCR (hospital order, performed in [facility] hospital lab) Nasopharyngeal Nasopharyngeal Swab     Status: None   Collection Time: 06/27/20  6:54 PM   Specimen: Nasopharyngeal Swab  Result Value Ref Range Status   SARS Coronavirus 2 NEGATIVE NEGATIVE Final    Comment: (NOTE) SARS-CoV-2 target nucleic acids are NOT DETECTED.  The SARS-CoV-2 RNA is generally detectable in upper and lower respiratory specimens during the acute phase of infection. The lowest concentration of SARS-CoV-2 viral copies this assay can detect is 250 copies / mL. A negative result does not preclude SARS-CoV-2 infection and should not be used as the sole basis for treatment or other patient management decisions.  A negative result may occur with improper specimen collection / handling, submission of specimen other than nasopharyngeal swab, presence of viral mutation(s) within the areas targeted by this assay, and inadequate number of viral copies (<250 copies  / mL). A negative result must be combined with clinical observations, patient history, and epidemiological information.  Fact Sheet for Patients:   BoilerBrush.com.cy  Fact Sheet for Healthcare Providers: https://pope.com/  This test is not yet approved or  cleared by the [place] FDA and has been authorized for detection and/or diagnosis of SARS-CoV-2 by FDA under an Emergency Use Authorization (EUA).  This EUA will remain in effect (meaning this test can be used) for the duration of the COVID-19 declaration under Section 564(b)(1) of the Act, 21 U.S.C. section 360bbb-3(b)(1), unless the authorization is terminated or revoked sooner.  Performed at [facility], [address], [place], [place] [address]     Procedures/Studies: CARDIAC CATHETERIZATION  Result Date: 06/28/2020  Prox RCA lesion is 40% stenosed.  Prox LAD lesion is 30% stenosed.  Mid Cx to Dist Cx lesion is 40% stenosed.  1st Diag lesion is 30% stenosed.  2nd Diag lesion is 50% stenosed.  Dist LAD lesion is 50% stenosed.  Medical treatment with life style modification.   US RENAL  Result Date: 06/24/2020 CLINICAL DATA:  Acute kidney injury EXAM: RENAL / URINARY TRACT ULTRASOUND COMPLETE COMPARISON:  Radiograph 06/23/2020 FINDINGS: Right Kidney: Renal measurements: 11.5 x 5.2 x 6 cm = volume: 179.4 mL. Cortex is slightly echogenic. No hydronephrosis. Cyst in the upper pole measuring 1.8 cm. Left Kidney: Renal measurements: 10.1 x 5.5 x 5.5 cm = volume: 152.8 mL. Cortex is slightly echogenic. No hydronephrosis. Adjacent cysts in the upper pole measuring 1.8 cm and 3.6 cm. The larger cyst appears to contain echogenic 1.9 cm calcification or nodule. Bladder: Appears normal for degree of bladder distention. Other: None. IMPRESSION: 1. Echogenic kidneys consistent with medical renal disease. No hydronephrosis. 2. Bilateral renal cysts, less than 10. 3.6 cm cyst in the  upper pole of the left kidney contains echogenic calcification or nodule. Further evaluation with CT could be considered if/when clinically feasible. Electronically Signed   By: Jasmine Pang M.D.   On: 06/24/2020 16:10   DG Chest Port 1 View  Result Date: 06/24/2020 CLINICAL DATA:  Respiratory failure. EXAM: PORTABLE CHEST 1 VIEW COMPARISON:  06/23/2020 and  prior studies FINDINGS: Endotracheal tube is noted with tip 6.6 cm above the carina, consider 2-3 cm advancement. An NG tube is present with tip overlying the proximal stomach. Decreased bilateral airspace opacities/edema noted No pneumothorax or large pleural effusion identified. No acute bony abnormalities are present. IMPRESSION: 1. Endotracheal tube with tip 6.6 cm above the carina, consider 2-3 cm advancement. 2. Decreased bilateral airspace opacities/edema. Electronically Signed   By: Harmon Pier M.D.   On: 06/24/2020 06:58   DG Chest Port 1 View  Result Date: 06/23/2020 CLINICAL DATA:  Short of breath, intubated EXAM: PORTABLE CHEST 1 VIEW COMPARISON:  12/09/2016 FINDINGS: 2 frontal views of the chest demonstrate endotracheal tube overlying tracheal air column tip just below thoracic inlet. The cardiac silhouette is enlarged. There is bilateral airspace disease, right greater than left. No effusion or pneumothorax. No acute bony abnormalities. IMPRESSION: 1. Bilateral ground-glass airspace disease, right greater than left. Findings could reflect asymmetric edema or multifocal pneumonia. 2. No complication after intubation. Electronically Signed   By: Sharlet Salina M.D.   On: 06/23/2020 01:12   DG Abd Portable 1 View  Result Date: 06/23/2020 CLINICAL DATA:  verify placement OG tube EXAM: PORTABLE ABDOMEN - 1 VIEW. Left lateral abdomen collimated off view. COMPARISON:  None FINDINGS: Enteric tube noted with tip coursing below the diaphragm likely just distal to the gastroesophageal junction. Enteric side port noted overlying the expected region of  the distal esophagus. The bowel gas pattern is normal. No radio-opaque calculi or other significant radiographic abnormality are seen. Visualized right lung base is unremarkable. Visualized osseous structures are unremarkable. IMPRESSION: Enteric tube with tip coursing below the diaphragm likely just distal to the gastroesophageal junction. Enteric side port overlying the expected region of the distal esophagus. If side-port desired within the gastric lumen, consider advancing by at least 10 cm. Normal bowel gas pattern. These results will be called to the ordering clinician or representative by the Radiologist Assistant, and communication documented in the PACS or [employer]. Electronically Signed   By: Tish Frederickson M.D.   On: 06/23/2020 13:16   ECHOCARDIOGRAM COMPLETE  Result Date: 06/24/2020    ECHOCARDIOGRAM REPORT   Patient Name:   Zyshonne Osier Date of Exam: 06/23/2020 Medical Rec #:  [id number]    Height:       70.0 in Accession #:    [id number]   Weight:       230.0 lb Date of Birth:  Feb 10, 1956    BSA:          2.215 m Patient Age:    64 years     BP:           128/88 mmHg Patient Gender: M            HR:           76 bpm. Exam Location:  Inpatient Procedure: 2D Echo Indications:     CHF- Acute Diastolic I50.31  History:         Patient has no prior history of Echocardiogram examinations.                  COPD; Risk Factors:Hypertension.  Sonographer:     Thurman Coyer RDCS (AE) Referring Phys:  [id number] Reyes Ivan Diagnosing Phys: Orpah Cobb MD  Sonographer Comments: Echo performed with patient supine and on artificial respirator. IMPRESSIONS  1. Left ventricular ejection fraction, by estimation, is 25 to 30%. The left ventricle has severely decreased function. The left ventricle demonstrates  global hypokinesis. The left ventricular internal cavity size was mildly dilated. Left ventricular diastolic parameters are consistent with Grade I diastolic dysfunction (impaired relaxation).  2.  Right ventricular systolic function is mildly reduced. The right ventricular size is normal.  3. Left atrial size was moderately dilated.  4. Right atrial size was mildly dilated.  5. The mitral valve is normal in structure. Mild mitral valve regurgitation.  6. The aortic valve is tricuspid. Aortic valve regurgitation is not visualized. Mild to moderate aortic valve sclerosis/calcification is present, without any evidence of aortic stenosis.  7. There is mild (Grade II) atheroma plaque involving the ascending aorta.  8. The inferior vena cava is dilated in size with <50% respiratory variability, suggesting right atrial pressure of 15 mmHg. Conclusion(s)/Recommendation(s): Findings consistent with ischemic cardiomyopathy. FINDINGS  Left Ventricle: Left ventricular ejection fraction, by estimation, is 25 to 30%. The left ventricle has severely decreased function. The left ventricle demonstrates global hypokinesis. The left ventricular internal cavity size was mildly dilated. There is no left ventricular hypertrophy. Left ventricular diastolic parameters are consistent with Grade I diastolic dysfunction (impaired relaxation). Right Ventricle: The right ventricular size is normal. No increase in right ventricular wall thickness. Right ventricular systolic function is mildly reduced. Left Atrium: Left atrial size was moderately dilated. Right Atrium: Right atrial size was mildly dilated. Pericardium: There is no evidence of pericardial effusion. Mitral Valve: The mitral valve is normal in structure. There is mild calcification of the mitral valve leaflet(s). Mild mitral annular calcification. Mild mitral valve regurgitation. Tricuspid Valve: The tricuspid valve is normal in structure. Tricuspid valve regurgitation is trivial. Aortic Valve: The aortic valve is tricuspid. . There is mild thickening and moderate calcification of the aortic valve. Aortic valve regurgitation is not visualized. Mild to moderate aortic valve  sclerosis/calcification is present, without any evidence of aortic stenosis. Moderate aortic valve annular calcification. There is mild thickening of the aortic valve. There is moderate calcification of the aortic valve. Pulmonic Valve: The pulmonic valve was normal in structure. Pulmonic valve regurgitation is not visualized. Aorta: The aortic root is normal in size and structure. There is mild (Grade II) atheroma plaque involving the ascending aorta. Venous: The inferior vena cava is dilated in size with less than 50% respiratory variability, suggesting right atrial pressure of 15 mmHg. IAS/Shunts: The interatrial septum was not assessed.  LEFT VENTRICLE PLAX 2D LVIDd:         6.40 cm  Diastology LVIDs:         5.70 cm  LV e' lateral:   5.55 cm/s LV PW:         1.10 cm  LV E/e' lateral: 9.6 LV IVS:        0.90 cm  LV e' medial:    5.00 cm/s LVOT diam:     2.40 cm  LV E/e' medial:  10.6 LVOT Area:     4.52 cm  RIGHT VENTRICLE RV S prime:     10.30 cm/s TAPSE (M-mode): 1.8 cm LEFT ATRIUM             Index       RIGHT ATRIUM           Index LA diam:        3.40 cm 1.53 cm/m  RA Area:     13.90 cm LA Vol (A2C):   52.6 ml 23.75 ml/m RA Volume:   30.50 ml  13.77 ml/m LA Vol (A4C):   55.5 ml 25.06 ml/m LA Biplane  Vol: 54.1 ml 24.42 ml/m   AORTA Ao Root diam: 3.90 cm MITRAL VALVE MV Area (PHT): 2.84 cm    SHUNTS MV Decel Time: 267 msec    Systemic Diam: 2.40 cm MV E velocity: 53.10 cm/s MV A velocity: 59.60 cm/s MV E/A ratio:  0.89 Orpah Cobb MD Electronically signed by Orpah Cobb MD Signature Date/Time: 06/24/2020/2:20:31 PM    Final     Labs: BNP (last 3 results) Recent Labs    06/23/20 0118  BNP 829.3*   Basic Metabolic Panel: Recent Labs  Lab  0000 06/23/20 1001 06/23/20 2100 06/23/20 2209 06/24/20 0040 06/24/20 0040 06/24/20 0838 06/24/20 1535 06/25/20 0303 06/26/20 0721 06/27/20 0239 06/28/20 0333 06/29/20 0530  NA  --   --   --    < > 141   < >   < >  --  138 140 136 137 138  K   --   --   --    < > 5.1   < >   < >  --  3.8 4.1 4.0 4.3 4.4  CL   < >  --   --   --  109   < >  --   --  106 108 104 105 109  CO2   < >  --   --   --  19*   < >  --   --  GLUCOSE   < >  --   --   --  153*   < >  --   --  100* 123* 93 98 101*  BUN   < >  --   --   --  35*   < >  --   --  52* 29* 20 26* 27*  CREATININE   < >  --   --   --  2.79*   < >  --   --  3.09* 1.87* 1.54* 1.68* 1.63*  CALCIUM   < >  --   --   --  9.2   < >  --   --  8.8* 9.2 9.1 9.0 9.2  MG  --  2.0 2.0  --  2.1  --   --  2.2  --   --   --   --   --   PHOS  --  3.5 4.6  --  4.9*  --   --  5.0*  --   --   --   --   --    < > = values in this interval not displayed.   Liver Function Tests: Recent Labs  Lab 06/23/20 0110  AST 59*  ALT 59*  ALKPHOS 156*  BILITOT 0.6  PROT 7.4  ALBUMIN 3.6   No results for input(s): LIPASE, AMYLASE in the last 168 hours. No results for input(s): AMMONIA in the last 168 hours. CBC: Recent Labs  Lab 06/23/20 0110 06/23/20 0137 06/23/20 0424 06/23/20 2209 06/25/20 0303 06/26/20 0721 06/27/20 0239 06/28/20 0333 06/29/20 0530  WBC 5.5   < > 8.4   < > 7.3 6.2 5.3 6.2 5.0  NEUTROABS 2.3  --  7.2  --   --   --   --   --   --   HGB 14.2   < > 13.5   < > 11.6* 11.7* 11.9* 11.7* 11.3*  HCT 47.8   < > 45.3   < > 37.0* 35.9* 37.2* 37.7*  36.2*  MCV 91.0   < > 91.9   < > 88.7 86.3 84.5 86.9 86.2  PLT 221   < > 170   < > 155 152 145* 167 158   < > = values in this interval not displayed.   Cardiac Enzymes: No results for input(s): CKTOTAL, CKMB, CKMBINDEX, TROPONINI in the last 168 hours. BNP: Invalid input(s): POCBNP CBG: Recent Labs  Lab 06/28/20 1612 06/28/20 2038 06/29/20 0009 06/29/20 0354 06/29/20 0614  GLUCAP 111* 136* 100* 95 95   D-Dimer No results for input(s): DDIMER in the last 72 hours. Hgb A1c No results for input(s): HGBA1C in the last 72 hours. Lipid Profile Recent Labs    06/27/20 0239 06/28/20 0333  TRIG 101 83   Thyroid function  studies No results for input(s): TSH, T4TOTAL, T3FREE, THYROIDAB in the last 72 hours.  Invalid input(s): FREET3 Anemia work up No results for input(s): VITAMINB12, FOLATE, FERRITIN, TIBC, IRON, RETICCTPCT in the last 72 hours. Urinalysis    Component Value Date/Time   COLORURINE YELLOW 06/23/2020 0518   APPEARANCEUR HAZY (A) 06/23/2020 0518   LABSPEC 1.010 06/23/2020 0518   PHURINE 5.0 06/23/2020 0518   GLUCOSEU NEGATIVE 06/23/2020 0518   HGBUR SMALL (A) 06/23/2020 0518   BILIRUBINUR NEGATIVE 06/23/2020 0518   KETONESUR NEGATIVE 06/23/2020 0518   PROTEINUR 100 (A) 06/23/2020 0518   NITRITE NEGATIVE 06/23/2020 0518   LEUKOCYTESUR NEGATIVE 06/23/2020 0518   Sepsis Labs Invalid input(s): PROCALCITONIN,  WBC,  LACTICIDVEN Microbiology Recent Results (from the past 240 hour(s))  SARS Coronavirus 2 by RT PCR (hospital order, performed in [facility] hospital lab) Nasopharyngeal Nasopharyngeal Swab     Status: None   Collection Time: 06/23/20  1:18 AM   Specimen: Nasopharyngeal Swab  Result Value Ref Range Status   SARS Coronavirus 2 NEGATIVE NEGATIVE Final    Comment: (NOTE) SARS-CoV-2 target nucleic acids are NOT DETECTED.  The SARS-CoV-2 RNA is generally detectable in upper and lower respiratory specimens during the acute phase of infection. The lowest concentration of SARS-CoV-2 viral copies this assay can detect is 250 copies / mL. A negative result does not preclude SARS-CoV-2 infection and should not be used as the sole basis for treatment or other patient management decisions.  A negative result may occur with improper specimen collection / handling, submission of specimen other than nasopharyngeal swab, presence of viral mutation(s) within the areas targeted by this assay, and inadequate number of viral copies (<250 copies / mL). A negative result must be combined with clinical observations, patient history, and epidemiological information.  Fact Sheet for Patients:    BoilerBrush.com.cy  Fact Sheet for Healthcare Providers: https://pope.com/  This test is not yet approved or  cleared by the [place] FDA and has been authorized for detection and/or diagnosis of SARS-CoV-2 by FDA under an Emergency Use Authorization (EUA).  This EUA will remain in effect (meaning this test can be used) for the duration of the COVID-19 declaration under Section 564(b)(1) of the Act, 21 U.S.C. section 360bbb-3(b)(1), unless the authorization is terminated or revoked sooner.  Performed at [facility], [address], [place], [place] [address]   MRSA PCR Screening     Status: None   Collection Time: 06/23/20  9:50 PM   Specimen: Nasal Mucosa; Nasopharyngeal  Result Value Ref Range Status   MRSA by PCR NEGATIVE NEGATIVE Final    Comment:        The GeneXpert MRSA Assay (FDA approved for NASAL  specimens only), is one component of a comprehensive MRSA colonization surveillance program. It is not intended to diagnose MRSA infection nor to guide or monitor treatment for MRSA infections. Performed at [facility], [address] [place], [place] [address]   Culture, respiratory (non-expectorated)     Status: None   Collection Time: 06/24/20 10:52 AM   Specimen: Tracheal Aspirate; Respiratory  Result Value Ref Range Status   Specimen Description TRACHEAL ASPIRATE  Final   Special Requests NONE  Final   Gram Stain   Final    ABUNDANT WBC PRESENT,BOTH PMN AND MONONUCLEAR MODERATE GRAM POSITIVE COCCI FEW GRAM NEGATIVE RODS RARE GRAM VARIABLE ROD    Culture   Final    FEW KLEBSIELLA PNEUMONIAE NO STAPHYLOCOCCUS AUREUS ISOLATED No Pseudomonas species isolated Performed at [facility], [address], [place], [place] [address]    Report Status 06/26/2020 FINAL  Final   Organism ID, Bacteria KLEBSIELLA PNEUMONIAE  Final      Susceptibility   Klebsiella pneumoniae - MIC*    AMPICILLIN  RESISTANT Resistant     CEFAZOLIN <=4 SENSITIVE Sensitive     CEFEPIME <=0.12 SENSITIVE Sensitive     CEFTAZIDIME <=1 SENSITIVE Sensitive     CEFTRIAXONE <=0.25 SENSITIVE Sensitive     CIPROFLOXACIN <=0.25 SENSITIVE Sensitive     GENTAMICIN <=1 SENSITIVE Sensitive     IMIPENEM <=0.25 SENSITIVE Sensitive     TRIMETH/SULFA <=20 SENSITIVE Sensitive     AMPICILLIN/SULBACTAM 4 SENSITIVE Sensitive     PIP/TAZO <=4 SENSITIVE Sensitive     * FEW KLEBSIELLA PNEUMONIAE  SARS Coronavirus 2 by RT PCR (hospital order, performed in [facility] hospital lab) Nasopharyngeal Nasopharyngeal Swab     Status: None   Collection Time: 06/27/20  6:54 PM   Specimen: Nasopharyngeal Swab  Result Value Ref Range Status   SARS Coronavirus 2 NEGATIVE NEGATIVE Final    Comment: (NOTE) SARS-CoV-2 target nucleic acids are NOT DETECTED.  The SARS-CoV-2 RNA is generally detectable in upper and lower respiratory specimens during the acute phase of infection. The lowest concentration of SARS-CoV-2 viral copies this assay can detect is 250 copies / mL. A negative result does not preclude SARS-CoV-2 infection and should not be used as the sole basis for treatment or other patient management decisions.  A negative result may occur with improper specimen collection / handling, submission of specimen other than nasopharyngeal swab, presence of viral mutation(s) within the areas targeted by this assay, and inadequate number of viral copies (<250 copies / mL). A negative result must be combined with clinical observations, patient history, and epidemiological information.  Fact Sheet for Patients:   BoilerBrush.com.cy  Fact Sheet for Healthcare Providers: https://pope.com/  This test is not yet approved or  cleared by the [place] FDA and has been authorized for detection and/or diagnosis of SARS-CoV-2 by FDA under an Emergency Use Authorization (EUA).  This EUA will  remain in effect (meaning this test can be used) for the duration of the COVID-19 declaration under Section 564(b)(1) of the Act, 21 U.S.C. section 360bbb-3(b)(1), unless the authorization is terminated or revoked sooner.  Performed at [facility], [address], [place], [place] [address]      Time coordinating discharge: 25  minutes  SIGNED: Lanae Boast, MD  [facility] 06/29/2020, 5:14 PM  If 7PM-7AM, please contact night-coverage [email or web address]

## 2020-07-15 ENCOUNTER — Encounter (HOSPITAL_COMMUNITY): Payer: Self-pay

## 2020-08-30 ENCOUNTER — Other Ambulatory Visit: Payer: Self-pay

## 2020-08-30 ENCOUNTER — Ambulatory Visit (INDEPENDENT_AMBULATORY_CARE_PROVIDER_SITE_OTHER): Payer: Medicaid Other

## 2020-08-30 ENCOUNTER — Encounter: Payer: Self-pay | Admitting: Internal Medicine

## 2020-08-30 ENCOUNTER — Ambulatory Visit (INDEPENDENT_AMBULATORY_CARE_PROVIDER_SITE_OTHER): Payer: Medicaid Other | Admitting: Internal Medicine

## 2020-08-30 DIAGNOSIS — J449 Chronic obstructive pulmonary disease, unspecified: Secondary | ICD-10-CM

## 2020-08-30 DIAGNOSIS — F17210 Nicotine dependence, cigarettes, uncomplicated: Secondary | ICD-10-CM | POA: Diagnosis not present

## 2020-08-30 DIAGNOSIS — R0600 Dyspnea, unspecified: Secondary | ICD-10-CM

## 2020-08-30 DIAGNOSIS — R0609 Other forms of dyspnea: Secondary | ICD-10-CM

## 2020-08-30 MED ORDER — BREZTRI AEROSPHERE 160-9-4.8 MCG/ACT IN AERO: 2.0000 | INHALATION_SPRAY | Freq: Two times a day (BID) | RESPIRATORY_TRACT | 11 refills | Status: DC

## 2020-08-30 MED ORDER — BREZTRI AEROSPHERE 160-9-4.8 MCG/ACT IN AERO
2.0000 | INHALATION_SPRAY | Freq: Two times a day (BID) | RESPIRATORY_TRACT | 0 refills | Status: DC
Start: 2020-08-30 — End: 2020-12-22

## 2020-08-30 NOTE — Assessment & Plan Note (Addendum)
Echo 06/23/20  Left ventricular ejection fraction, by estimation, is 25 to 30%. The  left ventricle has severely decreased function. The left ventricle  demonstrates global hypokinesis. The left ventricular internal cavity size  was mildly dilated. Left ventricular diastolic parameters are consistent with Grade I diastolic dysfunction (impaired relaxation).  2. Right ventricular systolic function is mildly reduced. The right  ventricular size is normal.  3. Left atrial size was moderately dilated.  4. Right atrial size was mildly dilated.  5. The mitral valve is normal in structure. Mild mitral valve  regurgitation.  6. The aortic valve is tricuspid. Aortic valve regurgitation is not  visualized. Mild to moderate aortic valve sclerosis/calcification is  present, without any evidence of aortic stenosis.  7. There is mild (Grade II) atheroma plaque involving the ascending  aorta.  8. The inferior vena cava is dilated in size with <50% respiratory  variability, suggesting right atrial pressure of 15 mmHg.  - LHC  06/28/2020  Mild/mod CAD  Clearly multifactorial sob  With component of chf which will need to remain  optimized preop and care to avoid vol overload during surgery.

## 2020-08-30 NOTE — Progress Notes (Signed)
Phillip Kennedy, male    DOB: 1955/12/23,     MRN: [id number]   Brief patient profile:  66 yobm smoker with h/o chf referred to pulmonary clinic 08/30/2020 by Dr Renaye Rakers for R knee replacement clearance  - told he had copd by ER rx albuterol around 2016 and ever since    Admit date: 06/23/2020 Discharge date: 06/29/2020  Admitted From: home Disposition:  home  Recommendations for Outpatient Follow-up:  1. Follow up with PCP in 1-2 weeks 2. Please obtain BMP/CBC in one week 3. Please follow up on the following pending results:  Home Health:no  Equipment/Devices: none  Discharge Condition: Stable Code Status:   Code Status: Full Code Diet recommendation:     Diet Order                  Diet - low sodium heart healthy            Diet Heart Room service appropriate? Yes; Fluid consistency: Thin  Diet effective now                   Brief/Interim Summary:  63 year old male with history of hypertension, COPD who came into the emergency room with shortness of breath. He did not tolerate CPAP on arrival after being used by ~EMS and had to be emergently intubated on arrival to the emergency room. Patient was severely hypertensive chest x-ray was showing bilateral pulmonary infiltrates worse on the right side with BNP of 829 and PCT of  > 11 . He was started on nitroglycerin drip and was sedated.Transferredto the ICU post intubation. UDS positive for cocaine and benzodiazepine on 06/23/2020. TRH assumed care on 06/26/2020. Hospital course complicated byNSTEMI. Seen by cardiology, plan for heart cath on 06/28/2020. Cath no acute findings and advised medical management. Seen by cardiology this morning and okay to discharge home on aspirin Plavix Imdur Toprol and Dr Riki Altes will follow-up as outpatient  Discharge Diagnoses:   NSTEMI: Status post cardiac cath plan is for medical management aspirin, Lipitor, Plavix, Imdur, Toprol.  Follow-up with cardiology as  outpatient.  Klebsiella community-acquired pneumonia patient continued on the antibiotic for 2 more days and stop.  Dyspnea likely from non-ST elevation MI, improved.  No more shortness of breath  Hypertensive emergency blood pressure is stabilized we will continue on adjusted Imdur, hydralazine, amlodipine, continue home clonidine.  Count METOPROLOL as per cardiology,  Acute on chronic combined systolic and diastolic CHF Echo with EF 25 to 30% and grade 1 diastolic dysfunction.  Currently euvolemic.  Has elevated creatinine not on Lasix, will follow up with cardiology for further management is advised to monitor intake output and daily weight.  AKI on CKD stage III, baseline creatinine around 1.5.  He is advised to follow-up with cardiology or PCP to monitor renal function next 3 to 5 days.  Renal ultrasound showed medical renal disease, left kidney contains echogenic calcification are noted which may require further evaluation with CT scan as outpatient.    History of Present Illness  08/30/2020  Pulmonary/ 1st office eval/[name]  Better breathing since discharge  Chief Complaint  Patient presents with  . Follow-up    pt is for surgical clearance left total knee replacement  Dyspnea:  100 ft more limited by knee than sob  Cough: sev times a month congested and rx albuterol helps  Sleep: flat bed 2 pillows SABA use: twice  Daily whether needs or not  No obvious day to day or daytime  variability or assoc excess/ purulent sputum or mucus plugs or hemoptysis or cp or chest tightness, subjective wheeze or overt sinus or hb symptoms.   Sleeping fine as above without nocturnal  or early am exacerbation  of respiratory  c/o's or need for noct saba. Also denies any obvious fluctuation of symptoms with weather or environmental changes or other aggravating or alleviating factors except as outlined above   No unusual exposure hx or h/o childhood pna/ asthma or knowledge of premature  birth.  Current Allergies, Complete Past Medical History, Past Surgical History, Family History, and Social History were reviewed in [employer] record.  ROS  The following are not active complaints unless bolded Hoarseness, sore throat, dysphagia, dental problems, itching, sneezing,  nasal congestion or discharge of excess mucus or purulent secretions, ear ache,   fever, chills, sweats, unintended wt loss or wt gain, classically pleuritic or exertional cp,  orthopnea pnd or arm/hand swelling  or leg swelling, presyncope, palpitations, abdominal pain, anorexia, nausea, vomiting, diarrhea  or change in bowel habits or change in bladder habits, change in stools or change in urine, dysuria, hematuria,  rash, arthralgias, visual complaints, headache, numbness, weakness or ataxia or problems with walking/ uses cane or coordination,  change in mood or  memory.           Past Medical History:  Diagnosis Date  . Arthritis    "knees and back" (12/24/2017)  . Chronic bronchitis (HCC)   . Dyspnea   . High cholesterol   . Hypertension     Outpatient Medications Prior to Visit  Medication Sig Dispense Refill  . acetaminophen (TYLENOL) 500 MG tablet Take 1,000 mg by mouth 2 (two) times daily as needed for mild pain.    Marland Kitchen albuterol (PROVENTIL HFA;VENTOLIN HFA) 108 (90 Base) MCG/ACT inhaler Inhale 1-2 puffs into the lungs every 6 (six) hours as needed for wheezing or shortness of breath. 1 Inhaler 0  . allopurinol (ZYLOPRIM) 100 MG tablet Take 100 mg by mouth as needed (gout).    Marland Kitchen amLODipine (NORVASC) 5 MG tablet Take 1 tablet (5 mg total) by mouth daily.    Marland Kitchen aspirin EC 81 MG EC tablet Take 1 tablet (81 mg total) by mouth daily.    Marland Kitchen aspirin EC 81 MG tablet Take 81 mg by mouth daily. Swallow whole.    Marland Kitchen atorvastatin (LIPITOR) 40 MG tablet Take 1 tablet by mouth daily.    Marland Kitchen atorvastatin (LIPITOR) 40 MG tablet Take 40 mg by mouth every evening.    . Calcium Carb-Cholecalciferol  (CALCIUM + D3 PO) Take 1 tablet by mouth daily.    . cloNIDine (CATAPRES) 0.2 MG tablet Take 0.2 mg by mouth at bedtime.  0  . cloNIDine (CATAPRES) 0.2 MG tablet Take 0.2 mg by mouth at bedtime.    . docusate sodium (COLACE) 100 MG capsule Take 1 capsule (100 mg total) by mouth 2 (two) times daily. To prevent constipation while taking pain medication. 60 capsule 0  . doxycycline (VIBRA-TABS) 100 MG tablet Take 1 tablet (100 mg total) by mouth every 12 (twelve) hours. 20 tablet 0  . hydrALAZINE (APRESOLINE) 25 MG tablet Take 25 mg by mouth 3 (three) times daily.    . hydrALAZINE (APRESOLINE) 50 MG tablet Take 1 tablet (50 mg total) by mouth 3 (three) times daily. 90 tablet 0  . metoprolol succinate (TOPROL-XL) 50 MG 24 hr tablet Take 1 tablet (50 mg total) by mouth daily. Take with or immediately following  a meal. 30 tablet 0  . metoprolol tartrate (LOPRESSOR) 25 MG tablet Take 1 tablet (25 mg total) by mouth 2 (two) times daily. 60 tablet 3  . nicotine (NICODERM CQ - DOSED IN MG/24 HOURS) 21 mg/24hr patch Place 1 patch (21 mg total) onto the skin daily. 28 patch 0  . Omega-3 Fatty Acids (FISH OIL PO) Take 1 capsule by mouth daily.    . Omega-3 Fatty Acids (FISH OIL PO) Take 1 capsule by mouth daily.    Marland Kitchen PROAIR HFA 108 (90 Base) MCG/ACT inhaler Inhale 2 puffs into the lungs every 4 (four) hours as needed for shortness of breath.    Marland Kitchen amLODipine (NORVASC) 5 MG tablet Take 1 tablet (5 mg total) by mouth daily. 30 tablet 0  . isosorbide mononitrate (IMDUR) 120 MG 24 hr tablet Take 1 tablet (120 mg total) by mouth daily. 30 tablet 0   No facility-administered medications prior to visit.     Objective:     BP 130/78 (BP Location: Left Arm, Cuff Size: Normal)   Pulse 75   Temp (!) 97.1 F (36.2 C) (Oral)   Ht 5\' 11"  (1.803 m)   Wt 189 lb 6.4 oz (85.9 kg)   SpO2 97%   BMI 26.42 kg/m   SpO2: 97 %  RA  HEENT : pt wearing mask not removed for exam due to covid -19 concerns.    NECK :   without JVD/Nodes/TM/ nl carotid upstrokes bilaterally   LUNGS: no acc muscle use,  Mod barrel  contour chest wall with coarse insp/ exp rhonchi bilaterally and  without cough on insp or exp maneuvers and mod  Hyperresonant  to  percussion bilaterally     CV:  RRR  no s3 or murmur or increase in P2, and no edema   ABD:  soft and nontender with pos mid insp Hoover's  in the supine position. No bruits or organomegaly appreciated, bowel sounds nl  MS:     ext warm without deformities, calf tenderness, cyanosis or clubbing No obvious joint restrictions   SKIN: warm and dry without lesions    NEURO:  alert, approp, nl sensorium with  no motor or cerebellar deficits apparent.         CXR PA and Lateral:   08/30/2020 :    I personally reviewed images and  radiology impression as follows:  Mild/mod copd with mild cm/ no chf or infiltrates         Assessment   COPD  GOLD 0  / still smoking Active smoker  - 08/30/2020  After extensive coaching inhaler device,  effectiveness =    75% from a baseline of 50% > breztri 2bid and return for pfts    Group D in terms of symptom/risk and laba/lama/ICS  therefore appropriate rx at this point >>>  breztri 2bid samples x 2 weeks and return for pfts for clearance.    There is no "red line" for non-thoracic surgery in terms of cut off for intubation but he is clearly not in the best shape at present so rec bring back on breztri for pfts first   DOE (dyspnea on exertion) Echo 06/23/20  Left ventricular ejection fraction, by estimation, is 25 to 30%. The  left ventricle has severely decreased function. The left ventricle  demonstrates global hypokinesis. The left ventricular internal cavity size  was mildly dilated. Left ventricular diastolic parameters are consistent with Grade I diastolic dysfunction (impaired relaxation).  2. Right ventricular systolic function  is mildly reduced. The right  ventricular size is normal.  3. Left atrial size was  moderately dilated.  4. Right atrial size was mildly dilated.  5. The mitral valve is normal in structure. Mild mitral valve  regurgitation.  6. The aortic valve is tricuspid. Aortic valve regurgitation is not  visualized. Mild to moderate aortic valve sclerosis/calcification is  present, without any evidence of aortic stenosis.  7. There is mild (Grade II) atheroma plaque involving the ascending  aorta.  8. The inferior vena cava is dilated in size with <50% respiratory  variability, suggesting right atrial pressure of 15 mmHg.  - LHC  06/28/2020  Mild/mod CAD  Clearly multifactorial sob  With component of chf which will need to remain  optimized preop and care to avoid vol overload during surgery.   Cigarette smoker Counseled re importance of smoking cessation but did not meet time criteria for separate billing    Advised re need to stop in short run at least 2 weeks preop to reduce risk of surgery           Each maintenance medication was reviewed in detail including emphasizing most importantly the difference between maintenance and prns and under what circumstances the prns are to be triggered using an action plan format where appropriate.  Total time for H and P, chart review, counseling, teaching device and generating customized AVS unique to this new pt office visit / charting =  63 min          Sandrea Hughs, MD 08/30/2020

## 2020-08-30 NOTE — Assessment & Plan Note (Addendum)
Active smoker  - 08/30/2020  After extensive coaching inhaler device,  effectiveness =    75% from a baseline of 50% > breztri 2bid and return for pfts    Group D in terms of symptom/risk and laba/lama/ICS  therefore appropriate rx at this point >>>  breztri 2bid samples x 2 weeks and return for pfts for clearance.    There is no "red line" for non-thoracic surgery in terms of cut off for intubation but he is clearly not in the best shape at present so rec bring back on breztri for pfts first

## 2020-08-30 NOTE — Patient Instructions (Signed)
Plan A = Automatic = Always=    Breztri Take 2 puffs first thing in am and then another 2 puffs about 12 hours later.    Work on inhaler technique:  relax and gently blow all the way out then take a nice smooth deep breath back in, triggering the inhaler at same time you start breathing in.  Hold for up to 5 seconds if you can. Blow out thru nose. Rinse and gargle with water when done      Plan B = Backup (to supplement plan A, not to replace it) Only use your albuterol inhaler as a rescue medication to be used if you can't catch your breath by resting or doing a relaxed purse lip breathing pattern.  - The less you use it, the better it will work when you need it. - Ok to use the inhaler up to 2 puffs  every 4 hours if you must but call for appointment if use goes up over your usual need - Don't leave home without it !!  (think of it like the spare tire for your car)      Please remember to go to the  x-ray department  for your tests - we will call you with the results when they are available    The key is to stop smoking completely before smoking completely stops you - especially 2 weeks prior    Please schedule a follow up visit in 3 months but call sooner if needed with pfts

## 2020-08-30 NOTE — Assessment & Plan Note (Addendum)
Counseled re importance of smoking cessation but did not meet time criteria for separate billing    Advised re need to stop in short run at least 2 weeks preop to reduce risk of surgery           Each maintenance medication was reviewed in detail including emphasizing most importantly the difference between maintenance and prns and under what circumstances the prns are to be triggered using an action plan format where appropriate.  Total time for H and P, chart review, counseling, teaching device and generating customized AVS unique to this new pt office visit / charting =  63 min

## 2020-08-31 ENCOUNTER — Telehealth: Payer: Self-pay

## 2020-08-31 ENCOUNTER — Encounter: Payer: Self-pay | Admitting: Internal Medicine

## 2020-08-31 NOTE — Telephone Encounter (Signed)
Pt was informed of cxr results wants to know if you will be signing off on surgical clearance

## 2020-08-31 NOTE — Telephone Encounter (Signed)
For purely elective surgery would like to do pfts first  If possible - if there is delay and surgery is more urgent due to increasing pain or immobility then we could approve at a higher risk level and anesthesiology would still have final say on whether safe to put him to sleep so my preference is the get the pfts first

## 2020-09-01 NOTE — Telephone Encounter (Signed)
PFT was ordered at last OV but was not scheduled.  Next available PFT in office is 09/27/20.   lmtcb for pt to see if he has TKR sx already scheduled.  Wcb.

## 2020-11-04 ENCOUNTER — Other Ambulatory Visit (HOSPITAL_COMMUNITY)
Admission: RE | Admit: 2020-11-04 | Discharge: 2020-11-04 | Disposition: A | Discharge status: Home / Self Care | Payer: Medicaid Other | Source: Ambulatory Visit | Attending: Primary Care | Admitting: Primary Care

## 2020-11-04 DIAGNOSIS — Z20822 Contact with and (suspected) exposure to covid-19: Secondary | ICD-10-CM | POA: Diagnosis not present

## 2020-11-04 DIAGNOSIS — Z01812 Encounter for preprocedural laboratory examination: Secondary | ICD-10-CM | POA: Diagnosis present

## 2020-11-04 LAB — SARS CORONAVIRUS 2 (TAT 6-24 HRS): SARS Coronavirus 2: NEGATIVE

## 2020-11-07 ENCOUNTER — Ambulatory Visit: Payer: Medicaid Other | Admitting: Internal Medicine

## 2020-11-07 ENCOUNTER — Ambulatory Visit: Payer: Medicaid Other | Admitting: Primary Care

## 2020-12-19 ENCOUNTER — Other Ambulatory Visit (HOSPITAL_COMMUNITY)
Admission: RE | Admit: 2020-12-19 | Discharge: 2020-12-19 | Disposition: A | Discharge status: Home / Self Care | Payer: Medicaid Other | Source: Ambulatory Visit | Attending: Internal Medicine | Admitting: Internal Medicine

## 2020-12-19 DIAGNOSIS — Z20822 Contact with and (suspected) exposure to covid-19: Secondary | ICD-10-CM | POA: Insufficient documentation

## 2020-12-19 DIAGNOSIS — Z01812 Encounter for preprocedural laboratory examination: Secondary | ICD-10-CM | POA: Insufficient documentation

## 2020-12-19 LAB — SARS CORONAVIRUS 2 (TAT 6-24 HRS): SARS Coronavirus 2: NEGATIVE

## 2020-12-22 ENCOUNTER — Other Ambulatory Visit: Payer: Self-pay

## 2020-12-22 ENCOUNTER — Encounter: Payer: Self-pay | Admitting: Internal Medicine

## 2020-12-22 ENCOUNTER — Ambulatory Visit (INDEPENDENT_AMBULATORY_CARE_PROVIDER_SITE_OTHER): Payer: Medicare Other | Admitting: Internal Medicine

## 2020-12-22 DIAGNOSIS — R0600 Dyspnea, unspecified: Secondary | ICD-10-CM

## 2020-12-22 DIAGNOSIS — F17210 Nicotine dependence, cigarettes, uncomplicated: Secondary | ICD-10-CM

## 2020-12-22 DIAGNOSIS — J449 Chronic obstructive pulmonary disease, unspecified: Secondary | ICD-10-CM

## 2020-12-22 DIAGNOSIS — R0609 Other forms of dyspnea: Secondary | ICD-10-CM

## 2020-12-22 LAB — PULMONARY FUNCTION TEST
DL/VA % pred: 86 %
DL/VA: 3.56 ml/min/mmHg/L
DLCO cor % pred: 97 %
DLCO cor: 27.15 ml/min/mmHg
DLCO unc % pred: 97 %
DLCO unc: 27.15 ml/min/mmHg
FEF 25-75 Post: 1.13 L/sec
FEF 25-75 Pre: 1.92 L/sec
FEF2575-%Change-Post: -41 %
FEF2575-%Pred-Post: 39 %
FEF2575-%Pred-Pre: 67 %
FEV1-%Change-Post: -9 %
FEV1-%Pred-Post: 86 %
FEV1-%Pred-Pre: 94 %
FEV1-Post: 2.72 L
FEV1-Pre: 3 L
FEV1FVC-%Change-Post: 1 %
FEV1FVC-%Pred-Pre: 88 %
FEV6-%Change-Post: -10 %
FEV6-%Pred-Post: 98 %
FEV6-%Pred-Pre: 110 %
FEV6-Post: 3.88 L
FEV6-Pre: 4.35 L
FEV6FVC-%Pred-Post: 104 %
FEV6FVC-%Pred-Pre: 104 %
FVC-%Change-Post: -10 %
FVC-%Pred-Post: 95 %
FVC-%Pred-Pre: 106 %
FVC-Post: 3.9 L
FVC-Pre: 4.35 L
Post FEV1/FVC ratio: 70 %
Post FEV6/FVC ratio: 100 %
Pre FEV1/FVC ratio: 69 %
Pre FEV6/FVC Ratio: 100 %
RV % pred: 182 %
RV: 4.35 L
TLC % pred: 123 %
TLC: 8.91 L

## 2020-12-22 MED ORDER — PROAIR HFA 108 (90 BASE) MCG/ACT IN AERS: 2.0000 | INHALATION_SPRAY | RESPIRATORY_TRACT | 11 refills | Status: DC | PRN

## 2020-12-22 NOTE — Assessment & Plan Note (Signed)
4-5 min discussion re active cigarette smoking in addition to office E&M  Ask about tobacco use:   Ongoing  Advise quitting   Advised ideally needs to quit x 2 weeks preop with immediate benefit of lower resp complications from surgery needed, a good short term motivator  Assess willingness:  Not really  committed at this point fully but promises to consider, esp preop Assist in quit attempt:  Per PCP when ready Arrange follow up:   Follow up per Primary Care planned  For smoking cessation classes call [phone]           Each maintenance medication was reviewed in detail including emphasizing most importantly the difference between maintenance and prns and under what circumstances the prns are to be triggered using an action plan format where appropriate.  Total time for H and P, chart review, counseling, reviewing hfa  device(s) and generating customized AVS unique to this office visit / same day charting = 20 min

## 2020-12-22 NOTE — Assessment & Plan Note (Addendum)
Active smoker  - 08/30/2020  After extensive coaching inhaler device,  effectiveness =    75% from a baseline of 50% > breztri 2bid and return for pfts  - PFT's  12/22/2020  FEV1 3.00 (94 % ) ratio 0.69  p 0 % improvement from saba p 0 prior to study with DLCO  27.15 (97%) corrects to 3.56 (85%)  for alv volume and FV curve min concavity    He does not have significant copd so gold 0 at present and should not progress if stops smoking now.    Ok to just use saba prn and call if > 2 x daily needed  Cleared for surgery  Addendum:  Given that he has minimal airflow obstruction on pfts, he does not have significant copd and his risk of surgery from a pulmonary perspective is low

## 2020-12-22 NOTE — Patient Instructions (Signed)
You do not have significant copd  You may have Asthmatic Bronchitis but this is a permanent condition and respond to breathing clean air and using albuterol as needed   - if needing more than twice daily return to this clinic  Remember to use the albuterol x 2 pffs w/in a hour or so of surgery  The key is to stop smoking completely before smoking completely stops you!  Pulmonary follow up is as needed

## 2020-12-22 NOTE — Progress Notes (Signed)
Phillip Kennedy, male    DOB: March 26, 1956,     MRN: [id number]   Brief patient profile:  54 yobm smoker with h/o chf referred to pulmonary clinic 08/30/2020 by Dr Renaye Rakers for R knee replacement clearance  - told he had copd by ER rx albuterol around 2016 and ever since    Admit date: 06/23/2020 Discharge date: 06/29/2020  Admitted From: home Disposition:  home  Recommendations for Outpatient Follow-up:  1. Follow up with PCP in 1-2 weeks 2. Please obtain BMP/CBC in one week 3. Please follow up on the following pending results:  Home Health:no  Equipment/Devices: none  Discharge Condition: Stable Code Status:   Code Status: Full Code    Brief/Interim Summary:  65 year old male with history of hypertension, COPD who came into the emergency room with shortness of breath. He did not tolerate CPAP on arrival after being used by ~EMS and had to be emergently intubated on arrival to the emergency room. Patient was severely hypertensive chest x-ray was showing bilateral pulmonary infiltrates worse on the right side with BNP of 829 and PCT of  > 11 . He was started on nitroglycerin drip and was sedated.Transferredto the ICU post intubation. UDS positive for cocaine and benzodiazepine on 06/23/2020. TR[facility] assumed care on 06/26/2020. Hospital course complicated byNSTEMI. Seen by cardiology, plan for heart cath on 06/28/2020. Cath no acute findings and advised medical management. Seen by cardiology this morning and okay to discharge home on aspirin Plavix Imdur Toprol and Dr Phillip Kennedy will follow-up as outpatient  Discharge Diagnoses:   NSTEMI: Status post cardiac cath plan is for medical management aspirin, Lipitor, Plavix, Imdur, Toprol.  Follow-up with cardiology as outpatient.  Klebsiella community-acquired pneumonia patient continued on the antibiotic for 2 more days and stop.  Dyspnea likely from non-ST elevation MI, improved.  No more shortness of breath  Hypertensive  emergency blood pressure is stabilized we will continue on adjusted Imdur, hydralazine, amlodipine, continue home clonidine.  Count METOPROLOL as per cardiology,  Acute on chronic combined systolic and diastolic CHF Echo with EF 25 to 30% and grade 1 diastolic dysfunction.  Currently euvolemic.  Has elevated creatinine not on Lasix, will follow up with cardiology for further management is advised to monitor intake output and daily weight.  AKI on CKD stage III, baseline creatinine around 1.5.  He is advised to follow-up with cardiology or PCP to monitor renal function next 3 to 5 days.  Renal ultrasound showed medical renal disease, left kidney contains echogenic calcification are noted which may require further evaluation with CT scan as outpatient.    History of Present Illness  08/30/2020  Pulmonary/ 1st office eval/[name]  Better breathing since discharge  Chief Complaint  Patient presents with  . Follow-up    pt is for surgical clearance left total knee replacement  Dyspnea:  100 ft more limited by knee than sob  Cough: sev times a month congested and rx albuterol helps  Sleep: flat bed 2 pillows SABA use: twice  Daily whether needs or not rec Plan A = Automatic = Always=    Breztri Take 2 puffs first thing in am and then another 2 puffs about 12 hours later.  Work on inhaler technique:  Plan B = Backup (to supplement plan A, not to replace it) Only use your albuterol inhaler as a rescue medication The key is to stop smoking completely before smoking completely stops you - especially 2 weeks prior Please schedule a follow up visit in  3 months but call sooner if needed with pfts     12/22/2020  f/u ov/[name] re: copd 0/ still smoking/ needs clearance for R Knee surgery/ Phillip Kennedy No chief complaint on file.  Dyspnea:  Limited by R knee pain  Cough: some am mucoid production, clears p 2 coughs Sleeping: ok flat bed, 2 pillows  SABA use: not using (empty as is breztri) 02: none  Covid  status:   2nd vaccine May 2021    No obvious day to day or daytime variability or assoc  purulent sputum or mucus plugs or hemoptysis or cp or chest tightness, subjective wheeze or overt sinus or hb symptoms.   sleeping without nocturnal  exacerbation  of respiratory  c/o's or need for noct saba. Also denies any obvious fluctuation of symptoms with weather or environmental changes or other aggravating or alleviating factors except as outlined above   No unusual exposure hx or h/o childhood pna/ asthma or knowledge of premature birth.  Current Allergies, Complete Past Medical History, Past Surgical History, Family History, and Social History were reviewed in [employer] record.  ROS  The following are not active complaints unless bolded Hoarseness, sore throat, dysphagia, dental problems, itching, sneezing,  nasal congestion or discharge of excess mucus or purulent secretions, ear ache,   fever, chills, sweats, unintended wt loss or wt gain, classically pleuritic or exertional cp,  orthopnea pnd or arm/hand swelling  or leg swelling, presyncope, palpitations, abdominal pain, anorexia, nausea, vomiting, diarrhea  or change in bowel habits or change in bladder habits, change in stools or change in urine, dysuria, hematuria,  rash, arthralgias, visual complaints, headache, numbness, weakness or ataxia or problems with walking or coordination,  change in mood or  memory.        Current Meds - - NOTE:   Unable to verify as accurately reflecting what pt takes     Medication Sig  . acetaminophen (TYLENOL) 500 MG tablet Take 1,000 mg by mouth 2 (two) times daily as needed for mild pain.  Marland Kitchen amLODipine (NORVASC) 5 MG tablet Take 1 tablet (5 mg total) by mouth daily.  Marland Kitchen aspirin EC 81 MG EC tablet Take 1 tablet (81 mg total) by mouth daily.  Marland Kitchen atorvastatin (LIPITOR) 40 MG tablet Take 40 mg by mouth every evening.  . cloNIDine (CATAPRES) 0.2 MG tablet Take 0.2 mg by mouth at bedtime.   . clopidogrel (PLAVIX) 75 MG tablet Take 75 mg by mouth daily.  . hydrALAZINE (APRESOLINE) 50 MG tablet Take 1 tablet (50 mg total) by mouth 3 (three) times daily.  . isosorbide mononitrate (IMDUR) 120 MG 24 hr tablet Take 120 mg by mouth daily.  . Omega-3 Fatty Acids (FISH OIL PO) Take 1 capsule by mouth daily.  .     .             Past Medical History:  Diagnosis Date  . Arthritis    "knees and back" (12/24/2017)  . Chronic bronchitis (HCC)   . Dyspnea   . High cholesterol   . Hypertension        Objective:       Wt Readings from Last 3 Encounters:  12/22/20 199 lb (90.3 kg)  08/30/20 189 lb 6.4 oz (85.9 kg)  06/29/20 196 lb 1.6 oz (89 kg)      Vital signs reviewed  12/22/2020  - Note at rest 02 sats  99% on RA   General appearance:    amb bm nad   /  minimally congested sounding cough  HEENT : pt wearing mask not removed for exam due to covid - 19 concerns.   NECK :  without JVD/Nodes/TM/ nl carotid upstrokes bilaterally   LUNGS: no acc muscle use,  Min barrel  contour chest wall with bilateral  slightly decreased bs s audible wheeze and  without cough on insp or exp maneuvers and min  Hyperresonant  to  percussion bilaterally     CV:  RRR  no s3 or murmur or increase in P2, and no edema   ABD:  soft and nontender with pos end  insp Hoover's  in the supine position. No bruits or organomegaly appreciated, bowel sounds nl  MS:   Nl gait/  ext warm without deformities, calf tenderness, cyanosis or clubbing No obvious joint restrictions   SKIN: warm and dry without lesions    NEURO:  alert, approp, nl sensorium with  no motor or cerebellar deficits apparent.             Assessment

## 2020-12-22 NOTE — Progress Notes (Signed)
Full PFT performed today. °

## 2020-12-22 NOTE — Patient Instructions (Signed)
Full PFT performed today. °

## 2020-12-28 ENCOUNTER — Telehealth: Payer: Self-pay | Admitting: Internal Medicine

## 2020-12-28 NOTE — Telephone Encounter (Signed)
Ok for surgery - he has only mild copd

## 2020-12-28 NOTE — Telephone Encounter (Signed)
Per new office protocol - we do not complete forms any more - the provider needs to do a addendum and write a risk assessment and that is faxed to the surgeon with the last ov note. Pr

## 2020-12-28 NOTE — Telephone Encounter (Signed)
Noted.   Phillip Kennedy has form been given to [name] or placed in his box to be filled out. Did not see it upfront. Thanks :)

## 2020-12-28 NOTE — Telephone Encounter (Signed)
Rec'd fax from [facility], pt to have right total knee replacement done by Dr. Margarita Rana. [facility] needing pulmonary clearance. Please complete risk assessment for surgery and any recommendation with last ov to [phone]. Phone number [phone] ext [phone] attn Tresa Endo. -pr

## 2020-12-28 NOTE — Telephone Encounter (Signed)
Routing to Dr. Sherene Sires so he can do an addendum on last OV note to do the risk assessment. Once this has been done, we can then fax pt's OV.

## 2020-12-29 NOTE — Telephone Encounter (Signed)
done

## 2020-12-29 NOTE — Telephone Encounter (Signed)
Called and spoke with Tresa Endo to confirm fax number. Dr. Sherene Sires has done an addendum to his notes clearing patient for surgery. OV note has been faxed over to [facility] ATTN: Tresa Endo. Confirmation received. Nothing further needed at this time.

## 2021-01-26 NOTE — Progress Notes (Signed)
Please place orders in epic pt. Is scheduled for preop 

## 2021-01-26 NOTE — Progress Notes (Addendum)
PCP - Orpah Cobb, MD Cardiologist -  Pulmonology-Dr. Sherene Sires clearance 12-22-20 epic  PPM/ICD -  Device Orders -  Rep Notified -   Chest x-ray - 08-31-20 EKG - 06-29-20 Stress Test -  ECHO - 06-24-20 Cardiac Cath - 06-28-20  Sleep Study -  CPAP -   Fasting Blood Sugar -  Checks Blood Sugar _____ times a day  Blood Thinner Instructions: Aspirin Instructions:  ERAS Protcol - PRE-SURGERY Ensure or G2-   COVID TEST- 4-15  Activity-- Anesthesia review: COPD Gold 0,HTN,  Patient denies shortness of breath, fever, cough and chest pain at PAT appointment   All instructions explained to the patient, with a verbal understanding of the material. Patient agrees to go over the instructions while at home for a better understanding. Patient also instructed to self quarantine after being tested for COVID-19. The opportunity to ask questions was provided.

## 2021-01-26 NOTE — Patient Instructions (Addendum)
DUE TO COVID-19 ONLY ONE VISITOR IS ALLOWED TO COME WITH YOU AND STAY IN THE WAITING ROOM ONLY DURING PRE OP AND PROCEDURE DAY OF SURGERY. WO VISITOR  MAY VISIT WITH YOU AFTER SURGERY IN YOUR PRIVATE ROOM DURING VISITING HOURS ONLY!  YOU NEED TO HAVE A COVID 19 TEST ON__4-15_____ @_______ , THIS TEST MUST BE DONE BEFORE SURGERY,  COVID TESTING SITE [address] [place] ,   IT IS ON THE RIGHT GOING OUT [address] APPROXIMATELY  2 MINUTES PAST [employer] ON THE RIGHT. ONCE YOUR COVID TEST IS COMPLETED,  PLEASE BEGIN THE QUARANTINE INSTRUCTIONS AS OUTLINED IN YOUR HANDOUT.                Phillip Kennedy  01/26/2021   Your procedure is scheduled on: 02-07-21   Report to [facility] Main  Entrance   Report to short stay  at      0515   AM     Call this number if you have problems the morning of surgery [phone]    Remember: Do not eat food    :After Midnight                 .you may have clear liquids until 0430 am then nothing by mouth    CLEAR LIQUID DIET   Foods Allowed                                                                                      Foods Excluded  Black Coffee and tea, regular and decaf                             liquids that you cannot  Plain Jell-O any favor except red or purple                                           see through such as: Fruit ices (not with fruit pulp)                                                       milk, soups, orange juice  Iced Popsicles                                                       All solid food Carbonated beverages, regular and diet                                    Cranberry, grape and apple juices Sports drinks like Gatorade Lightly seasoned clear broth or consume(fat free) Sugar, honey syrup  _____________________________________________________________________     BRUSH YOUR TEETH MORNING OF SURGERY AND RINSE YOUR MOUTH OUT, NO CHEWING GUM CANDY OR  MINTS.     Take these medicines the morning of surgery with A SIP OF WATER: inhalers and bring with you, amlodipine, hydralazine, Imdur,metoprolol                                 You may not have any metal on your body including hair pins and              piercings  Do not wear jewelry,  lotions, powders or perfumes, deodorant              Men may shave face and neck.   Do not bring valuables to the hospital. [facility] IS NOT             RESPONSIBLE   FOR VALUABLES.  Contacts, dentures or bridgework may not be worn into surgery.      Patients discharged the day of surgery will not be allowed to drive home. IF YOU ARE HAVING SURGERY AND GOING HOME THE SAME DAY, YOU MUST HAVE AN ADULT TO DRIVE YOU HOME AND BE WITH YOU FOR 24 HOURS. YOU MAY GO HOME BY TAXI OR UBER OR ORTHERWISE, BUT AN ADULT MUST ACCOMPANY YOU HOME AND STAY WITH YOU FOR 24 HOURS.  Name and phone number of your driver:  Special Instructions: N/A              Please read over the following fact sheets you were given: _____________________________________________________________________             [facility] - Preparing for Surgery Before surgery, you can play an important role.  Because skin is not sterile, your skin needs to be as free of germs as possible.  You can reduce the number of germs on your skin by washing with CHG (chlorahexidine gluconate) soap before surgery.  CHG is an antiseptic cleaner which kills germs and bonds with the skin to continue killing germs even after washing. Please DO NOT use if you have an allergy to CHG or antibacterial soaps.  If your skin becomes reddened/irritated stop using the CHG and inform your nurse when you arrive at Short Stay. Do not shave (including legs and underarms) for at least 48 hours prior to the first CHG shower.  You may shave your face/neck. Please follow these instructions carefully:  1.  Shower with CHG Soap the night before surgery and the  morning of  Surgery.  2.  If you choose to wash your hair, wash your hair first as usual with your  normal  shampoo.  3.  After you shampoo, rinse your hair and body thoroughly to remove the  shampoo.                           4.  Use CHG as you would any other liquid soap.  You can apply chg directly  to the skin and wash                       Gently with a scrungie or clean washcloth.  5.  Apply the CHG Soap to your body ONLY FROM THE NECK DOWN.   Do not use on face/ open  Wound or open sores. Avoid contact with eyes, ears mouth and genitals (private parts).                       Wash face,  Genitals (private parts) with your normal soap.             6.  Wash thoroughly, paying special attention to the area where your surgery  will be performed.  7.  Thoroughly rinse your body with warm water from the neck down.  8.  DO NOT shower/wash with your normal soap after using and rinsing off  the CHG Soap.                9.  Pat yourself dry with a clean towel.            10.  Wear clean pajamas.            11.  Place clean sheets on your bed the night of your first shower and do not  sleep with pets. Day of Surgery : Do not apply any lotions/deodorants the morning of surgery.  Please wear clean clothes to the hospital/surgery center.  FAILURE TO FOLLOW THESE INSTRUCTIONS MAY RESULT IN THE CANCELLATION OF YOUR SURGERY PATIENT SIGNATURE_________________________________  NURSE SIGNATURE__________________________________  ________________________________________________________________________   Phillip Kennedy  An incentive spirometer is a tool that can help keep your lungs clear and active. This tool measures how well you are filling your lungs with each breath. Taking long deep breaths may help reverse or decrease the chance of developing breathing (pulmonary) problems (especially infection) following:  A long period of time when you are unable to move or be active. BEFORE  THE PROCEDURE   If the spirometer includes an indicator to show your best effort, your nurse or respiratory therapist will set it to a desired goal.  If possible, sit up straight or lean slightly forward. Try not to slouch.  Hold the incentive spirometer in an upright position. INSTRUCTIONS FOR USE  1. Sit on the edge of your bed if possible, or sit up as far as you can in bed or on a chair. 2. Hold the incentive spirometer in an upright position. 3. Breathe out normally. 4. Place the mouthpiece in your mouth and seal your lips tightly around it. 5. Breathe in slowly and as deeply as possible, raising the piston or the ball toward the top of the column. 6. Hold your breath for 3-5 seconds or for as long as possible. Allow the piston or ball to fall to the bottom of the column. 7. Remove the mouthpiece from your mouth and breathe out normally. 8. Rest for a few seconds and repeat Steps 1 through 7 at least 10 times every 1-2 hours when you are awake. Take your time and take a few normal breaths between deep breaths. 9. The spirometer may include an indicator to show your best effort. Use the indicator as a goal to work toward during each repetition. 10. After each set of 10 deep breaths, practice coughing to be sure your lungs are clear. If you have an incision (the cut made at the time of surgery), support your incision when coughing by placing a pillow or rolled up towels firmly against it. Once you are able to get out of bed, walk around indoors and cough well. You may stop using the incentive spirometer when instructed by your caregiver.  RISKS AND COMPLICATIONS  Take your time so you do not get  dizzy or light-headed.  If you are in pain, you may need to take or ask for pain medication before doing incentive spirometry. It is harder to take a deep breath if you are having pain. AFTER USE  Rest and breathe slowly and easily.  It can be helpful to keep track of a log of your progress.  Your caregiver can provide you with a simple table to help with this. If you are using the spirometer at home, follow these instructions: [place] IF:   You are having difficultly using the spirometer.  You have trouble using the spirometer as often as instructed.  Your pain medication is not giving enough relief while using the spirometer.  You develop fever of 100.5 F (38.1 C) or higher. SEEK IMMEDIATE MEDICAL CARE IF:   You cough up bloody sputum that had not been present before.  You develop fever of 102 F (38.9 C) or greater.  You develop worsening pain at or near the incision site. MAKE SURE YOU:   Understand these instructions.  Will watch your condition.  Will get help right away if you are not doing well or get worse. Document Released: 02/18/2007 Document Revised: 12/31/2011 Document Reviewed: 04/21/2007 [facility] Patient Information 2014 [place], [place].   ________________________________________________________________________

## 2021-01-31 ENCOUNTER — Encounter (HOSPITAL_COMMUNITY)
Admission: RE | Admit: 2021-01-31 | Discharge: 2021-01-31 | Disposition: A | Discharge status: Home / Self Care | Payer: Medicare Other | Source: Ambulatory Visit | Attending: Orthopedic Surgery | Admitting: Orthopedic Surgery

## 2021-01-31 ENCOUNTER — Encounter (HOSPITAL_COMMUNITY): Payer: Self-pay

## 2021-01-31 HISTORY — DX: Chronic obstructive pulmonary disease, unspecified: J44.9

## 2021-02-01 ENCOUNTER — Inpatient Hospital Stay (HOSPITAL_COMMUNITY)
Admission: AD | Admit: 2021-02-01 | Discharge: 2021-02-06 | DRG: 603 | Disposition: A | Discharge status: Home / Self Care | Payer: Medicare Other | Source: Ambulatory Visit | Attending: Cardiovascular Disease | Admitting: Cardiovascular Disease

## 2021-02-01 ENCOUNTER — Other Ambulatory Visit: Payer: Self-pay

## 2021-02-01 DIAGNOSIS — Z7902 Long term (current) use of antithrombotics/antiplatelets: Secondary | ICD-10-CM

## 2021-02-01 DIAGNOSIS — M7989 Other specified soft tissue disorders: Secondary | ICD-10-CM | POA: Diagnosis not present

## 2021-02-01 DIAGNOSIS — Z20822 Contact with and (suspected) exposure to covid-19: Secondary | ICD-10-CM | POA: Diagnosis present

## 2021-02-01 DIAGNOSIS — J449 Chronic obstructive pulmonary disease, unspecified: Secondary | ICD-10-CM | POA: Diagnosis present

## 2021-02-01 DIAGNOSIS — F17200 Nicotine dependence, unspecified, uncomplicated: Secondary | ICD-10-CM | POA: Diagnosis present

## 2021-02-01 DIAGNOSIS — L03115 Cellulitis of right lower limb: Secondary | ICD-10-CM | POA: Diagnosis present

## 2021-02-01 DIAGNOSIS — I130 Hypertensive heart and chronic kidney disease with heart failure and stage 1 through stage 4 chronic kidney disease, or unspecified chronic kidney disease: Secondary | ICD-10-CM | POA: Diagnosis present

## 2021-02-01 DIAGNOSIS — M1711 Unilateral primary osteoarthritis, right knee: Secondary | ICD-10-CM | POA: Diagnosis present

## 2021-02-01 DIAGNOSIS — I2510 Atherosclerotic heart disease of native coronary artery without angina pectoris: Secondary | ICD-10-CM | POA: Diagnosis present

## 2021-02-01 DIAGNOSIS — N189 Chronic kidney disease, unspecified: Secondary | ICD-10-CM | POA: Diagnosis present

## 2021-02-01 DIAGNOSIS — E7800 Pure hypercholesterolemia, unspecified: Secondary | ICD-10-CM | POA: Diagnosis present

## 2021-02-01 DIAGNOSIS — E669 Obesity, unspecified: Secondary | ICD-10-CM | POA: Diagnosis present

## 2021-02-01 DIAGNOSIS — F17210 Nicotine dependence, cigarettes, uncomplicated: Secondary | ICD-10-CM | POA: Diagnosis present

## 2021-02-01 DIAGNOSIS — Z96652 Presence of left artificial knee joint: Secondary | ICD-10-CM | POA: Diagnosis present

## 2021-02-01 DIAGNOSIS — R609 Edema, unspecified: Secondary | ICD-10-CM | POA: Diagnosis not present

## 2021-02-01 DIAGNOSIS — Z79899 Other long term (current) drug therapy: Secondary | ICD-10-CM | POA: Diagnosis not present

## 2021-02-01 DIAGNOSIS — I501 Left ventricular failure: Secondary | ICD-10-CM | POA: Diagnosis present

## 2021-02-01 DIAGNOSIS — Z7982 Long term (current) use of aspirin: Secondary | ICD-10-CM

## 2021-02-01 DIAGNOSIS — I10 Essential (primary) hypertension: Secondary | ICD-10-CM | POA: Diagnosis present

## 2021-02-01 DIAGNOSIS — E785 Hyperlipidemia, unspecified: Secondary | ICD-10-CM | POA: Diagnosis present

## 2021-02-01 DIAGNOSIS — E1122 Type 2 diabetes mellitus with diabetic chronic kidney disease: Secondary | ICD-10-CM | POA: Diagnosis present

## 2021-02-01 DIAGNOSIS — R600 Localized edema: Secondary | ICD-10-CM | POA: Diagnosis present

## 2021-02-01 LAB — COMPREHENSIVE METABOLIC PANEL
ALT: 25 U/L (ref 0–44)
AST: 26 U/L (ref 15–41)
Albumin: 3.4 g/dL — ABNORMAL LOW (ref 3.5–5.0)
Alkaline Phosphatase: 49 U/L (ref 38–126)
Anion gap: 6 (ref 5–15)
BUN: 23 mg/dL (ref 8–23)
CO2: 27 mmol/L (ref 22–32)
Calcium: 9.2 mg/dL (ref 8.9–10.3)
Chloride: 107 mmol/L (ref 98–111)
Creatinine, Ser: 1.88 mg/dL — ABNORMAL HIGH (ref 0.61–1.24)
GFR, Estimated: 39 mL/min — ABNORMAL LOW (ref >60)
Glucose, Bld: 182 mg/dL — ABNORMAL HIGH (ref 70–99)
Potassium: 3.6 mmol/L (ref 3.5–5.1)
Sodium: 140 mmol/L (ref 135–145)
Total Bilirubin: 0.7 mg/dL (ref 0.3–1.2)
Total Protein: 6.1 g/dL — ABNORMAL LOW (ref 6.5–8.1)

## 2021-02-01 LAB — CBC WITH DIFFERENTIAL/PLATELET
Abs Immature Granulocytes: 0.01 10*3/uL (ref 0.00–0.07)
Basophils Absolute: 0 10*3/uL (ref 0.0–0.1)
Basophils Relative: 1 %
Eosinophils Absolute: 0.1 10*3/uL (ref 0.0–0.5)
Eosinophils Relative: 1 %
HCT: 35.8 % — ABNORMAL LOW (ref 39.0–52.0)
Hemoglobin: 11.4 g/dL — ABNORMAL LOW (ref 13.0–17.0)
Immature Granulocytes: 0 %
Lymphocytes Relative: 31 %
Lymphs Abs: 1.5 10*3/uL (ref 0.7–4.0)
MCH: 28 pg (ref 26.0–34.0)
MCHC: 31.8 g/dL (ref 30.0–36.0)
MCV: 88 fL (ref 80.0–100.0)
Monocytes Absolute: 0.5 10*3/uL (ref 0.1–1.0)
Monocytes Relative: 10 %
Neutro Abs: 2.8 10*3/uL (ref 1.7–7.7)
Neutrophils Relative %: 57 %
Platelets: 159 10*3/uL (ref 150–400)
RBC: 4.07 MIL/uL — ABNORMAL LOW (ref 4.22–5.81)
RDW: 13.2 % (ref 11.5–15.5)
WBC: 4.9 10*3/uL (ref 4.0–10.5)
nRBC: 0 % (ref 0.0–0.2)

## 2021-02-01 LAB — RESP PANEL BY RT-PCR (FLU A&B, COVID) ARPGX2
Influenza A by PCR: NEGATIVE
Influenza B by PCR: NEGATIVE
SARS Coronavirus 2 by RT PCR: NEGATIVE

## 2021-02-01 MED ORDER — METOPROLOL TARTRATE 25 MG PO TABS
25.0000 mg | ORAL_TABLET | Freq: Two times a day (BID) | ORAL | Status: DC
  Administered 2021-02-01 – 2021-02-06 (×10): 25 mg via ORAL
  Filled 2021-02-01 (×10): qty 1

## 2021-02-01 MED ORDER — ALBUTEROL SULFATE HFA 108 (90 BASE) MCG/ACT IN AERS
2.0000 | INHALATION_SPRAY | RESPIRATORY_TRACT | Status: DC | PRN
  Filled 2021-02-01: qty 6.7

## 2021-02-01 MED ORDER — CLOPIDOGREL BISULFATE 75 MG PO TABS: 75.0000 mg | ORAL_TABLET | Freq: Every day | ORAL | Status: DC

## 2021-02-01 MED ORDER — AMLODIPINE BESYLATE 5 MG PO TABS
5.0000 mg | ORAL_TABLET | Freq: Every day | ORAL | Status: DC
  Administered 2021-02-02 – 2021-02-03 (×2): 5 mg via ORAL
  Filled 2021-02-01 (×2): qty 1

## 2021-02-01 MED ORDER — PNEUMOCOCCAL VAC POLYVALENT 25 MCG/0.5ML IJ INJ
0.5000 mL | INJECTION | INTRAMUSCULAR | Status: DC
  Filled 2021-02-01: qty 0.5

## 2021-02-01 MED ORDER — SODIUM CHLORIDE 0.9% FLUSH
3.0000 mL | Freq: Two times a day (BID) | INTRAVENOUS | Status: DC
  Administered 2021-02-01 – 2021-02-06 (×10): 3 mL via INTRAVENOUS

## 2021-02-01 MED ORDER — HEPARIN SODIUM (PORCINE) 5000 UNIT/ML IJ SOLN
5000.0000 [IU] | Freq: Three times a day (TID) | INTRAMUSCULAR | Status: DC
  Administered 2021-02-01 – 2021-02-06 (×14): 5000 [IU] via SUBCUTANEOUS
  Filled 2021-02-01 (×14): qty 1

## 2021-02-01 MED ORDER — CLONIDINE HCL 0.2 MG PO TABS
0.2000 mg | ORAL_TABLET | Freq: Every day | ORAL | Status: DC
  Administered 2021-02-01 – 2021-02-05 (×5): 0.2 mg via ORAL
  Filled 2021-02-01 (×5): qty 1

## 2021-02-01 MED ORDER — VANCOMYCIN HCL 1000 MG/200ML IV SOLN
1000.0000 mg | Freq: Two times a day (BID) | INTRAVENOUS | Status: DC
  Filled 2021-02-01 (×2): qty 200

## 2021-02-01 MED ORDER — SODIUM CHLORIDE 0.9 % IV SOLN
1.0000 g | INTRAVENOUS | Status: DC
  Administered 2021-02-01 – 2021-02-05 (×5): 1 g via INTRAVENOUS
  Filled 2021-02-01 (×5): qty 10

## 2021-02-01 MED ORDER — ATORVASTATIN CALCIUM 40 MG PO TABS
40.0000 mg | ORAL_TABLET | Freq: Every evening | ORAL | Status: DC
  Administered 2021-02-01 – 2021-02-05 (×5): 40 mg via ORAL
  Filled 2021-02-01 (×5): qty 1

## 2021-02-01 MED ORDER — HYDRALAZINE HCL 50 MG PO TABS
50.0000 mg | ORAL_TABLET | Freq: Three times a day (TID) | ORAL | Status: DC
  Administered 2021-02-01 – 2021-02-06 (×14): 50 mg via ORAL
  Filled 2021-02-01 (×14): qty 1

## 2021-02-01 MED ORDER — ACETAMINOPHEN 500 MG PO TABS
500.0000 mg | ORAL_TABLET | Freq: Four times a day (QID) | ORAL | Status: DC | PRN
  Administered 2021-02-01 – 2021-02-04 (×4): 500 mg via ORAL
  Filled 2021-02-01 (×4): qty 1

## 2021-02-01 MED ORDER — VANCOMYCIN HCL 1500 MG/300ML IV SOLN
1500.0000 mg | Freq: Once | INTRAVENOUS | Status: AC
  Administered 2021-02-01: 1500 mg via INTRAVENOUS
  Filled 2021-02-01: qty 300

## 2021-02-01 MED ORDER — VANCOMYCIN HCL 1250 MG/250ML IV SOLN
1250.0000 mg | INTRAVENOUS | Status: DC
  Administered 2021-02-02 – 2021-02-05 (×4): 1250 mg via INTRAVENOUS
  Filled 2021-02-01 (×4): qty 250

## 2021-02-01 MED ORDER — ASPIRIN EC 81 MG PO TBEC
81.0000 mg | DELAYED_RELEASE_TABLET | Freq: Every day | ORAL | Status: DC
  Administered 2021-02-02 – 2021-02-06 (×5): 81 mg via ORAL
  Filled 2021-02-01 (×5): qty 1

## 2021-02-01 NOTE — Progress Notes (Signed)
Patient is a direct admit. Patient is alert and oriented x4. Vital signs are stable and he is on room air. He has no complaints of pain. Skin is intact, no signs of skin breakdown noted on exam. He does have cellulitis of the left lower leg. Patient belongings at bedside (clothing and shoes). The patient was shown how to use the call bell. Call bell, phone and bedside table are within reach; bed is in the lowest position.

## 2021-02-01 NOTE — H&P (Signed)
Referring Physician: Orpah Cobb, MD  Phillip Kennedy is an 65 y.o. male.                       Chief Complaint: Right lower leg edema  HPI: 65 years old black male with PMH of HTN, HLD, COPD, tobacco use disorder, substance abuse, obesity and arthritis has recurrent right lower leg edema with soreness and erythema.  Past Medical History:  Diagnosis Date  . Arthritis    "knees and back" (12/24/2017)  . Chronic bronchitis (HCC)   . COPD (chronic obstructive pulmonary disease) (HCC)    Gold 0  . High cholesterol   . Hypertension       Past Surgical History:  Procedure Laterality Date  . CORONARY ANGIOGRAPHY N/A 06/28/2020   Procedure: CORONARY ANGIOGRAPHY (CATH LAB);  Surgeon: Orpah Cobb, MD;  Location: [facility] INVASIVE CV LAB;  Service: Cardiovascular;  Laterality: N/A;  . JOINT REPLACEMENT    . KNEE CARTILAGE SURGERY Bilateral   . MULTIPLE TOOTH EXTRACTIONS    . TOTAL KNEE ARTHROPLASTY Left 12/24/2017  . TOTAL KNEE ARTHROPLASTY Left 12/24/2017   Procedure: LEFT TOTAL KNEE ARTHROPLASTY;  Surgeon: Sheral Apley, MD;  Location: [facility] OR;  Service: Orthopedics;  Laterality: Left;    No family history on file. Social History:  reports that he has been smoking cigarettes. He has a 12.25 pack-year smoking history. He has never used smokeless tobacco. He reports that he does not drink alcohol and does not use drugs.  Allergies: No Known Allergies  Medications Prior to Admission  Medication Sig Dispense Refill  . acetaminophen (TYLENOL) 500 MG tablet Take 1,000 mg by mouth 2 (two) times daily as needed for mild pain.    Marland Kitchen amLODipine (NORVASC) 5 MG tablet Take 1 tablet (5 mg total) by mouth daily.    Marland Kitchen aspirin EC 81 MG EC tablet Take 1 tablet (81 mg total) by mouth daily.    Marland Kitchen atorvastatin (LIPITOR) 40 MG tablet Take 40 mg by mouth every evening.    . cloNIDine (CATAPRES) 0.2 MG tablet Take 0.2 mg by mouth at bedtime.    . clopidogrel (PLAVIX) 75 MG tablet Take 75 mg by mouth daily.    .  hydrALAZINE (APRESOLINE) 50 MG tablet Take 1 tablet (50 mg total) by mouth 3 (three) times daily. 90 tablet 0  . isosorbide mononitrate (IMDUR) 120 MG 24 hr tablet Take 120 mg by mouth daily.    . metoprolol tartrate (LOPRESSOR) 25 MG tablet Take 25 mg by mouth 2 (two) times daily.    . Omega-3 Fatty Acids (FISH OIL PO) Take 1 capsule by mouth daily.    Marland Kitchen PROAIR HFA 108 (90 Base) MCG/ACT inhaler Inhale 2 puffs into the lungs every 4 (four) hours as needed for shortness of breath. 18 g 11    No results found for this or any previous visit (from the past 48 hour(s)). No results found.  Review Of Systems Constitutional: No fever, chills, chronic weight gain. Eyes: No vision change, wears glasses. No discharge or pain. Ears: No hearing loss, No tinnitus. Respiratory: No asthma, positive COPD, pneumonias and shortness of breath. No hemoptysis. Cardiovascular: Positive chest pain, palpitation, leg edema. Gastrointestinal: No nausea, vomiting, diarrhea, constipation. No GI bleed. No hepatitis. Genitourinary: No dysuria, hematuria, kidney stone. No incontinance. Neurological: No headache, stroke, seizures.  Psychiatry: No psych facility admission for anxiety, depression, suicide. No detox. Skin: No rash. Musculoskeletal: Positive joint pain, no fibromyalgia. Positive neck  pain, back pain. Lymphadenopathy: No lymphadenopathy. Hematology: No anemia or easy bruising.   Blood pressure (!) 153/103, pulse 87, temperature 98.2 F (36.8 C), temperature source Oral, resp. rate 16, SpO2 100 %. There is no height or weight on file to calculate BMI. General appearance: alert, cooperative, appears stated age and mild distress Head: Normocephalic, atraumatic. Eyes: Brown eyes, pink conjunctiva, corneas clear.  Neck: No adenopathy, no carotid bruit, no JVD, supple, symmetrical, trachea midline and thyroid not enlarged. Resp: Clearing to auscultation bilaterally. Cardio: Regular rate and rhythm, S1, S2  normal, II/VI systolic murmur, no click, rub or gallop GI: Soft, non-tender; bowel sounds normal; no organomegaly. Extremities: 2 + right lower leg edema up to the knee with mild erythema and tenderness. No cyanosis or clubbing. Skin: Warm and dry.  Neurologic: Alert and oriented X 3, normal strength. Normal coordination and slow gait.  Assessment/Plan Cellulitis of right lower leg CAD Chronic left heart systolic failure COPD Obesity HTN Type 2 DM  Start vancomycin and Rocephin Elevate the leg. Resume home medications.  Time spent: Review of old records, Lab, x-rays, EKG, other cardiac tests, examination, discussion with patient/Wife over 70 minutes.  Phillip Rodriguez, MD  02/01/2021, 6:33 PM

## 2021-02-01 NOTE — Patient Instructions (Signed)
DUE TO COVID-19 ONLY ONE VISITOR IS ALLOWED TO COME WITH YOU AND STAY IN THE WAITING ROOM ONLY DURING PRE OP AND PROCEDURE DAY OF SURGERY. THE 1 VISITOR  MAY VISIT WITH YOU AFTER SURGERY IN YOUR PRIVATE ROOM DURING VISITING HOURS ONLY!  YOU NEED TO HAVE A COVID 19 TEST ON_4/15______ @_______ , THIS TEST MUST BE DONE BEFORE SURGERY,  COVID TESTING SITE [address] [place] , IT IS ON THE RIGHT GOING OUT [address] APPROXIMATELY  2 MINUTES PAST [employer] ON THE RIGHT. ONCE YOUR COVID TEST IS COMPLETED,  PLEASE BEGIN THE QUARANTINE INSTRUCTIONS AS OUTLINED IN YOUR HANDOUT.                Phillip Kennedy   Your procedure is scheduled on: 02/07/21   Report to [facility] Main  Entrance   Report to Short stay at 5:15 AM     Call this number if you have problems the morning of surgery [phone]     BRUSH YOUR TEETH MORNING OF SURGERY AND RINSE YOUR MOUTH OUT, NO CHEWING GUM CANDY OR MINTS.  No food after midnight.    You may have clear liquid until 4:30 AM.    At 4:00 AM drink pre surgery drink.   Nothing by mouth after 4:30 AM.    Take these medicines the morning of surgery with A SIP OF WATER: Amlodipine, Hydralazine, Imdur, Metoprolol Use your inhaler and bring it with you                                  You may not have any metal on your body including              piercings  Do not wear jewelry,  lotions, powders or deodorant              Men may shave face and neck.   Do not bring valuables to the hospital. [place] IS NOT             RESPONSIBLE   FOR VALUABLES.  Contacts, dentures or bridgework may not be worn into surgery.                   Please read over the following fact sheets you were given: _____________________________________________________________________             [facility] - Preparing for Surgery Before surgery, you can play an important role.  Because skin is not sterile, your skin needs to be  as free of germs as possible.  You can reduce the number of germs on your skin by washing with CHG (chlorahexidine gluconate) soap before surgery.  CHG is an antiseptic cleaner which kills germs and bonds with the skin to continue killing germs even after washing. Please DO NOT use if you have an allergy to CHG or antibacterial soaps.  If your skin becomes reddened/irritated stop using the CHG and inform your nurse when you arrive at Short Stay.  You may shave your face/neck.  Please follow these instructions carefully:  1.  Shower with CHG Soap the night before surgery and the  morning of Surgery.  2.  If you choose to wash your hair, wash your hair first as usual with your  normal  shampoo.  3.  After you shampoo, rinse your hair and body thoroughly to remove the  shampoo.  4.  Use CHG as you would any other liquid soap.  You can apply chg directly  to the skin and wash                       Gently with a scrungie or clean washcloth.  5.  Apply the CHG Soap to your body ONLY FROM THE NECK DOWN.   Do not use on face/ open                           Wound or open sores. Avoid contact with eyes, ears mouth and genitals (private parts).                       Wash face,  Genitals (private parts) with your normal soap.             6.  Wash thoroughly, paying special attention to the area where your surgery  will be performed.  7.  Thoroughly rinse your body with warm water from the neck down.  8.  DO NOT shower/wash with your normal soap after using and rinsing off  the CHG Soap.             9.  Pat yourself dry with a clean towel.            10.  Wear clean pajamas.            11.  Place clean sheets on your bed the night of your first shower and do not  sleep with pets. Day of Surgery : Do not apply any lotions/deodorants the morning of surgery.  Please wear clean clothes to the hospital/surgery center.  FAILURE TO FOLLOW THESE INSTRUCTIONS MAY RESULT IN THE  CANCELLATION OF YOUR SURGERY PATIENT SIGNATURE_________________________________  NURSE SIGNATURE__________________________________  ________________________________________________________________________   Phillip Kennedy  An incentive spirometer is a tool that can help keep your lungs clear and active. This tool measures how well you are filling your lungs with each breath. Taking long deep breaths may help reverse or decrease the chance of developing breathing (pulmonary) problems (especially infection) following:  A long period of time when you are unable to move or be active. BEFORE THE PROCEDURE   If the spirometer includes an indicator to show your best effort, your nurse or respiratory therapist will set it to a desired goal.  If possible, sit up straight or lean slightly forward. Try not to slouch.  Hold the incentive spirometer in an upright position. INSTRUCTIONS FOR USE  1. Sit on the edge of your bed if possible, or sit up as far as you can in bed or on a chair. 2. Hold the incentive spirometer in an upright position. 3. Breathe out normally. 4. Place the mouthpiece in your mouth and seal your lips tightly around it. 5. Breathe in slowly and as deeply as possible, raising the piston or the ball toward the top of the column. 6. Hold your breath for 3-5 seconds or for as long as possible. Allow the piston or ball to fall to the bottom of the column. 7. Remove the mouthpiece from your mouth and breathe out normally. 8. Rest for a few seconds and repeat Steps 1 through 7 at least 10 times every 1-2 hours when you are awake. Take your time and take a few normal breaths between deep breaths. 9. The spirometer may include an indicator to show your best effort.  Use the indicator as a goal to work toward during each repetition. 10. After each set of 10 deep breaths, practice coughing to be sure your lungs are clear. If you have an incision (the cut made at the time of surgery),  support your incision when coughing by placing a pillow or rolled up towels firmly against it. Once you are able to get out of bed, walk around indoors and cough well. You may stop using the incentive spirometer when instructed by your caregiver.  RISKS AND COMPLICATIONS  Take your time so you do not get dizzy or light-headed.  If you are in pain, you may need to take or ask for pain medication before doing incentive spirometry. It is harder to take a deep breath if you are having pain. AFTER USE  Rest and breathe slowly and easily.  It can be helpful to keep track of a log of your progress. Your caregiver can provide you with a simple table to help with this. If you are using the spirometer at home, follow these instructions: [place] IF:   You are having difficultly using the spirometer.  You have trouble using the spirometer as often as instructed.  Your pain medication is not giving enough relief while using the spirometer.  You develop fever of 100.5 F (38.1 C) or higher. SEEK IMMEDIATE MEDICAL CARE IF:   You cough up bloody sputum that had not been present before.  You develop fever of 102 F (38.9 C) or greater.  You develop worsening pain at or near the incision site. MAKE SURE YOU:   Understand these instructions.  Will watch your condition.  Will get help right away if you are not doing well or get worse. Document Released: 02/18/2007 Document Revised: 12/31/2011 Document Reviewed: 04/21/2007 [facility] Patient Information 2014 [place], [place].   ________________________________________________________________________

## 2021-02-01 NOTE — Progress Notes (Signed)
Pharmacy Antibiotic Note  Phillip Kennedy is a 65 y.o. male admitted on 02/01/2021 with right lower leg edema.  Pharmacy has been consulted for Vancomycin dosing for RLE cellulitis. SCr 1.88, estimated CrCl is 41.7 ml/min  Plan: Vancomycin 1500 mg x1 then 1250 mg IV Q 24  hrs. Goal AUC 400-550. Expected AUC:  492.6 SCr used: 1.88 Monitor clinical picture, renal function, vancomycin level at steady state F/U C&S, abx deescalation / LOT   Height: 5\' 11"  (180.3 cm) (02/01/2021) Weight: 90.3 kg (199 lb 1.2 oz) (12/22/2020) IBW/kg (Calculated) : 75.3  Temp (24hrs), Avg:98.2 F (36.8 C), Min:98.2 F (36.8 C), Max:98.2 F (36.8 C)  Recent Labs  Lab 02/01/21 1959  WBC 4.9  CREATININE 1.88*    Estimated Creatinine Clearance: 41.7 mL/min (A) (by C-G formula based on SCr of 1.88 mg/dL (H)).    No Known Allergies  Antimicrobials this admission: Vanc 4/13>> Ceftriaxone 4/13>>  Dose adjustments this admission: n/a  Microbiology results: 4/13 BCx: sent  4/13 COVID: neg;flu: neg   Thank you for allowing pharmacy to be a part of this patient's care.  5/13, RPh Clinical Pharmacist Please check AMION for all [facility] phone numbers After 10:00 PM, call Main Pharmacy [phone] 02/01/2021 9:44 PM

## 2021-02-02 ENCOUNTER — Encounter (HOSPITAL_COMMUNITY)
Admission: RE | Admit: 2021-02-02 | Discharge: 2021-02-02 | Disposition: A | Discharge status: Home / Self Care | Payer: Medicare Other | Source: Ambulatory Visit | Attending: Orthopedic Surgery | Admitting: Orthopedic Surgery

## 2021-02-02 ENCOUNTER — Inpatient Hospital Stay (HOSPITAL_COMMUNITY): Payer: Medicare Other

## 2021-02-02 DIAGNOSIS — M7989 Other specified soft tissue disorders: Secondary | ICD-10-CM

## 2021-02-02 DIAGNOSIS — R609 Edema, unspecified: Secondary | ICD-10-CM

## 2021-02-02 LAB — CBC
HCT: 33.9 % — ABNORMAL LOW (ref 39.0–52.0)
Hemoglobin: 11.1 g/dL — ABNORMAL LOW (ref 13.0–17.0)
MCH: 28.8 pg (ref 26.0–34.0)
MCHC: 32.7 g/dL (ref 30.0–36.0)
MCV: 88.1 fL (ref 80.0–100.0)
Platelets: 150 10*3/uL (ref 150–400)
RBC: 3.85 MIL/uL — ABNORMAL LOW (ref 4.22–5.81)
RDW: 13.2 % (ref 11.5–15.5)
WBC: 4.1 10*3/uL (ref 4.0–10.5)
nRBC: 0 % (ref 0.0–0.2)

## 2021-02-02 LAB — BASIC METABOLIC PANEL
Anion gap: 5 (ref 5–15)
BUN: 23 mg/dL (ref 8–23)
CO2: 27 mmol/L (ref 22–32)
Calcium: 8.8 mg/dL — ABNORMAL LOW (ref 8.9–10.3)
Chloride: 106 mmol/L (ref 98–111)
Creatinine, Ser: 1.73 mg/dL — ABNORMAL HIGH (ref 0.61–1.24)
GFR, Estimated: 43 mL/min — ABNORMAL LOW (ref >60)
Glucose, Bld: 110 mg/dL — ABNORMAL HIGH (ref 70–99)
Potassium: 3.5 mmol/L (ref 3.5–5.1)
Sodium: 138 mmol/L (ref 135–145)

## 2021-02-02 LAB — LIPID PANEL
Cholesterol: 156 mg/dL (ref 0–200)
HDL: 44 mg/dL (ref >40)
LDL Cholesterol: 98 mg/dL (ref 0–99)
Total CHOL/HDL Ratio: 3.5 RATIO
Triglycerides: 69 mg/dL (ref <150)
VLDL: 14 mg/dL (ref 0–40)

## 2021-02-02 LAB — GLUCOSE, CAPILLARY
Glucose-Capillary: 161 mg/dL — ABNORMAL HIGH (ref 70–99)
Glucose-Capillary: 165 mg/dL — ABNORMAL HIGH (ref 70–99)

## 2021-02-02 LAB — HEMOGLOBIN A1C
Hgb A1c MFr Bld: 7 % — ABNORMAL HIGH (ref 4.8–5.6)
Mean Plasma Glucose: 154.2 mg/dL

## 2021-02-02 MED ORDER — INSULIN ASPART 100 UNIT/ML ~~LOC~~ SOLN
0.0000 [IU] | Freq: Three times a day (TID) | SUBCUTANEOUS | Status: DC
Start: 2021-02-02 — End: 2021-02-06

## 2021-02-02 NOTE — Progress Notes (Signed)
Pt missed his PAT appointment. I spoke with a family member who said he was at the hospital. Pt was admitted for Rt lower extremity edema and cellulitis.

## 2021-02-02 NOTE — Progress Notes (Signed)
Lower extremity venous has been completed.   Preliminary results in CV Proc.   Blanch Media 02/02/2021 10:56 AM

## 2021-02-02 NOTE — Progress Notes (Signed)
Ref: Orpah Cobb, MD   Subjective:  VS stable. Right leg edema and discomfort continues.  Objective:  Vital Signs in the last 24 hours: Temp:  [98.1 F (36.7 C)-98.2 F (36.8 C)] 98.1 F (36.7 C) (04/14 0400) Pulse Rate:  [60-87] 66 (04/14 0400) Cardiac Rhythm: Normal sinus rhythm (04/14 0700) Resp:  [16-20] 17 (04/14 0400) BP: (121-159)/(72-130) 158/111 (04/14 0400) SpO2:  [98 %-100 %] 99 % (04/14 0400) Weight:  [90.2 kg-90.3 kg] 90.2 kg (04/14 0100)  Physical Exam: BP Readings from Last 1 Encounters:  02/02/21 (!) 158/111     Wt Readings from Last 1 Encounters:  02/02/21 90.2 kg    Weight change:  Body mass index is 27.73 kg/m. HEENT: [place]/AT, Eyes-Brown, Conjunctiva-Pink, Sclera-Non-icteric Neck: No JVD, No bruit, Trachea midline. Lungs:  Clear, Bilateral. Cardiac:  Regular rhythm, normal S1 and S2, no S3. III/VI systolic murmur. Abdomen:  Soft, non-tender. BS present. Extremities:  2 + right lower leg and foot edema present. No cyanosis. No clubbing. CNS: AxOx3, Cranial nerves grossly intact, moves all 4 extremities.  Skin: Warm and dry.   Intake/Output from previous day: 04/13 0701 - 04/14 0700 In: -  Out: 540 [Urine:540]    Lab Results: BMET    Component Value Date/Time   NA 138 02/02/2021 0059   NA 140 02/01/2021 1959   NA 138 06/29/2020 0530   K 3.5 02/02/2021 0059   K 3.6 02/01/2021 1959   K 4.4 06/29/2020 0530   CL 106 02/02/2021 0059   CL 107 02/01/2021 1959   CL 109 06/29/2020 0530   CO2 27 02/02/2021 0059   CO2 27 02/01/2021 1959   CO2 23 06/29/2020 0530   GLUCOSE 110 (H) 02/02/2021 0059   GLUCOSE 182 (H) 02/01/2021 1959   GLUCOSE 101 (H) 06/29/2020 0530   BUN 23 02/02/2021 0059   BUN 23 02/01/2021 1959   BUN 27 (H) 06/29/2020 0530   CREATININE 1.73 (H) 02/02/2021 0059   CREATININE 1.88 (H) 02/01/2021 1959   CREATININE 1.63 (H) 06/29/2020 0530   CALCIUM 8.8 (L) 02/02/2021 0059   CALCIUM 9.2 02/01/2021 1959   CALCIUM 9.2 06/29/2020  0530   GFRNONAA 43 (L) 02/02/2021 0059   GFRNONAA 39 (L) 02/01/2021 1959   GFRNONAA 44 (L) 06/29/2020 0530   GFRNONAA 42 (L) 06/28/2020 0333   GFRNONAA 47 (L) 06/27/2020 0239   GFRAA 51 (L) 06/29/2020 0530   GFRAA 49 (L) 06/28/2020 0333   GFRAA 54 (L) 06/27/2020 0239   CBC    Component Value Date/Time   WBC 4.1 02/02/2021 0059   RBC 3.85 (L) 02/02/2021 0059   HGB 11.1 (L) 02/02/2021 0059   HCT 33.9 (L) 02/02/2021 0059   PLT 150 02/02/2021 0059   MCV 88.1 02/02/2021 0059   MCH 28.8 02/02/2021 0059   MCHC 32.7 02/02/2021 0059   RDW 13.2 02/02/2021 0059   LYMPHSABS 1.5 02/01/2021 1959   MONOABS 0.5 02/01/2021 1959   EOSABS 0.1 02/01/2021 1959   BASOSABS 0.0 02/01/2021 1959   HEPATIC Function Panel Recent Labs    03/30/20 0342 06/23/20 0110 02/01/21 1959  PROT 5.8* 7.4 6.1*   HEMOGLOBIN A1C No components found for: HGA1C,  MPG CARDIAC ENZYMES Lab Results  Component Value Date   CKTOTAL 107 03/30/2020   BNP No results for input(s): PROBNP in the last 8760 hours. TSH No results for input(s): TSH in the last 8760 hours. CHOLESTEROL No results for input(s): CHOL in the last 8760 hours.  Scheduled Meds: .  amLODipine  5 mg Oral Daily  . aspirin EC  81 mg Oral Daily  . atorvastatin  40 mg Oral QPM  . cloNIDine  0.2 mg Oral QHS  . heparin  5,000 Units Subcutaneous Q8H  . hydrALAZINE  50 mg Oral TID  . metoprolol tartrate  25 mg Oral BID  . pneumococcal 23 valent vaccine  0.5 mL Intramuscular Tomorrow-1000  . sodium chloride flush  3 mL Intravenous Q12H   Continuous Infusions: . cefTRIAXone (ROCEPHIN)  IV 1 g (02/01/21 2108)  . vancomycin     PRN Meds:.acetaminophen, albuterol  Assessment/Plan: Cellulitis of right lower leg CAD Chronic left heart systolic failure COPD Obesity HTN Type 2 DM  Check venous duplex of right lower leg. Check Hgb A1C and Lipid panel. Continue IV antibiotics.   LOS: 1 day   Time spent including chart review, lab review,  examination, discussion with patient/Nurse : 30 min   Orpah Cobb  MD  02/02/2021, 10:38 AM

## 2021-02-03 LAB — GLUCOSE, CAPILLARY
Glucose-Capillary: 92 mg/dL (ref 70–99)
Glucose-Capillary: 95 mg/dL (ref 70–99)
Glucose-Capillary: 91 mg/dL (ref 70–99)
Glucose-Capillary: 135 mg/dL — ABNORMAL HIGH (ref 70–99)

## 2021-02-03 MED ORDER — FUROSEMIDE 10 MG/ML IJ SOLN
40.0000 mg | Freq: Once | INTRAMUSCULAR | Status: AC
  Administered 2021-02-03: 40 mg via INTRAVENOUS
  Filled 2021-02-03: qty 4

## 2021-02-03 MED ORDER — LINAGLIPTIN 5 MG PO TABS
5.0000 mg | ORAL_TABLET | Freq: Every day | ORAL | Status: DC
  Administered 2021-02-03 – 2021-02-06 (×4): 5 mg via ORAL
  Filled 2021-02-03 (×4): qty 1

## 2021-02-03 MED ORDER — AMLODIPINE BESYLATE 5 MG PO TABS
5.0000 mg | ORAL_TABLET | Freq: Two times a day (BID) | ORAL | Status: DC
  Administered 2021-02-03 – 2021-02-06 (×6): 5 mg via ORAL
  Filled 2021-02-03 (×6): qty 1

## 2021-02-03 MED ORDER — POTASSIUM CHLORIDE ER 10 MEQ PO TBCR: 10.0000 meq | EXTENDED_RELEASE_TABLET | Freq: Every day | ORAL | Status: DC

## 2021-02-03 MED ORDER — HYDRALAZINE HCL 20 MG/ML IJ SOLN
20.0000 mg | Freq: Once | INTRAMUSCULAR | Status: AC
  Administered 2021-02-03: 20 mg via INTRAVENOUS
  Filled 2021-02-03: qty 1

## 2021-02-03 MED ORDER — POTASSIUM CHLORIDE CRYS ER 10 MEQ PO TBCR
10.0000 meq | EXTENDED_RELEASE_TABLET | Freq: Two times a day (BID) | ORAL | Status: AC
  Administered 2021-02-03 – 2021-02-04 (×4): 10 meq via ORAL
  Filled 2021-02-03 (×6): qty 1

## 2021-02-03 NOTE — Progress Notes (Signed)
Patient medicated for elevated BP with 20mg  IV hydralazine per MD orders at 0551. On recheck, manual blood pressure reading is 162/110. Neuro check within normal limits.

## 2021-02-03 NOTE — Progress Notes (Signed)
Spoke with Dr. Algie Coffer via telephone, MD  notified of patient's elevated blood pressure readings this shift and current BP of 174/108. Telephone orders received to medicate with one time dose of 20mg  IV hydralazine. Will medicate as ordered and continue to monitor.

## 2021-02-03 NOTE — Progress Notes (Signed)
Ref: Phillip Cobb, MD   Subjective:  VS stable. Afebrile. No evidence of DVT on vascular ultrasound. Improving right leg edema and discomfort. Blood pressure remains elevated. Normal lipid panel. Elevated Hgb A1C. 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  Objective:  Vital Signs in the last 24 hours: Temp:  [98.2 F (36.8 C)-98.4 F (36.9 C)] 98.4 F (36.9 C) (04/15 0438) Pulse Rate:  [60-67] 67 (04/15 0655) Cardiac Rhythm: Sinus bradycardia (04/15 0800) Resp:  [17-20] 18 (04/15 0438) BP: (158-181)/(104-122) 162/110 (04/15 0655) SpO2:  [98 %-99 %] 98 % (04/15 0438)  Physical Exam: BP Readings from Last 1 Encounters:  02/03/21 (!) 162/110     Wt Readings from Last 1 Encounters:  02/02/21 90.2 kg    Weight change:  Body mass index is 27.73 kg/m. HEENT: [place]/AT, Eyes-Brown, Conjunctiva-Pink, Sclera-Non-icteric Neck: No JVD, No bruit, Trachea midline. Lungs:  Clear, Bilateral. Cardiac:  Regular rhythm, normal S1 and S2, no S3. II/VI systolic murmur. Abdomen:  Soft, non-tender. BS present. Extremities:  1 + right lower leg edema present. No cyanosis. No clubbing. Positive bilateral toe nail fungus. CNS: AxOx3, Cranial nerves grossly intact, moves all 4 extremities.  Skin: Warm and dry.   Intake/Output from previous day: 04/14 0701 - 04/15 0700 In: -  Out: 670 [Urine:670]    Lab Results: BMET    Component Value Date/Time   NA 138 02/02/2021 0059   NA 140 02/01/2021 1959   NA 138 06/29/2020 0530   K 3.5 02/02/2021 0059   K 3.6 02/01/2021 1959   K 4.4 06/29/2020 0530   CL 106 02/02/2021 0059   CL 107 02/01/2021 1959   CL 109 06/29/2020 0530   CO2 27 02/02/2021 0059   CO2 27 02/01/2021 1959   CO2 23 06/29/2020 0530   GLUCOSE 110 (H) 02/02/2021 0059   GLUCOSE 182 (H) 02/01/2021 1959   GLUCOSE 101 (H) 06/29/2020 0530   BUN 23 02/02/2021 0059   BUN 23 02/01/2021 1959   BUN 27 (H) 06/29/2020 0530    CREATININE 1.73 (H) 02/02/2021 0059   CREATININE 1.88 (H) 02/01/2021 1959   CREATININE 1.63 (H) 06/29/2020 0530   CALCIUM 8.8 (L) 02/02/2021 0059   CALCIUM 9.2 02/01/2021 1959   CALCIUM 9.2 06/29/2020 0530   GFRNONAA 43 (L) 02/02/2021 0059   GFRNONAA 39 (L) 02/01/2021 1959   GFRNONAA 44 (L) 06/29/2020 0530   GFRNONAA 42 (L) 06/28/2020 0333   GFRNONAA 47 (L) 06/27/2020 0239   GFRAA 51 (L) 06/29/2020 0530   GFRAA 49 (L) 06/28/2020 0333   GFRAA 54 (L) 06/27/2020 0239   CBC    Component Value Date/Time   WBC 4.1 02/02/2021 0059   RBC 3.85 (L) 02/02/2021 0059   HGB 11.1 (L) 02/02/2021 0059   HCT 33.9 (L) 02/02/2021 0059   PLT 150 02/02/2021 0059   MCV 88.1 02/02/2021 0059   MCH 28.8 02/02/2021 0059   MCHC 32.7 02/02/2021 0059   RDW 13.2 02/02/2021 0059   LYMPHSABS 1.5 02/01/2021 1959   MONOABS 0.5 02/01/2021 1959   EOSABS 0.1 02/01/2021 1959   BASOSABS 0.0 02/01/2021 1959   HEPATIC Function Panel Recent Labs    03/30/20 0342 06/23/20 0110 02/01/21 1959  PROT 5.8* 7.4 6.1*   HEMOGLOBIN A1C No components found for: HGA1C,  MPG CARDIAC ENZYMES Lab Results  Component Value Date   CKTOTAL 107 03/30/2020   BNP No results for input(s): PROBNP in the last 8760  hours. TSH No results for input(s): TSH in the last 8760 hours. CHOLESTEROL Recent Labs    02/02/21 1052  CHOL 156    Scheduled Meds: . amLODipine  5 mg Oral BID  . aspirin EC  81 mg Oral Daily  . atorvastatin  40 mg Oral QPM  . cloNIDine  0.2 mg Oral QHS  . heparin  5,000 Units Subcutaneous Q8H  . hydrALAZINE  50 mg Oral TID  . insulin aspart  0-15 Units Subcutaneous TID WC  . linagliptin  5 mg Oral Daily  . metoprolol tartrate  25 mg Oral BID  . pneumococcal 23 valent vaccine  0.5 mL Intramuscular Tomorrow-1000  . potassium chloride  10 mEq Oral BID  . sodium chloride flush  3 mL Intravenous Q12H   Continuous Infusions: . cefTRIAXone (ROCEPHIN)  IV 1 g (02/02/21 2015)  . vancomycin 1,250 mg  (02/02/21 2117)   PRN Meds:.acetaminophen, albuterol  Assessment/Plan: Cellulitis of right lower leg CAD Chronic systolic left heart failure COPD HTN Type 2 DM  Start Tradjenta for elevated Hgb A1C of 7 %. 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   LOS: 2 days   Time spent including chart review, lab review, examination, discussion with patient/Nurse : 30 min   Phillip Cobb  MD  02/03/2021, 1:42 PM

## 2021-02-04 LAB — BASIC METABOLIC PANEL
Anion gap: 7 (ref 5–15)
BUN: 22 mg/dL (ref 8–23)
CO2: 21 mmol/L — ABNORMAL LOW (ref 22–32)
Calcium: 9.2 mg/dL (ref 8.9–10.3)
Chloride: 110 mmol/L (ref 98–111)
Creatinine, Ser: 1.68 mg/dL — ABNORMAL HIGH (ref 0.61–1.24)
GFR, Estimated: 45 mL/min — ABNORMAL LOW (ref >60)
Glucose, Bld: 90 mg/dL (ref 70–99)
Potassium: 3.8 mmol/L (ref 3.5–5.1)
Sodium: 138 mmol/L (ref 135–145)

## 2021-02-04 LAB — GLUCOSE, CAPILLARY
Glucose-Capillary: 112 mg/dL — ABNORMAL HIGH (ref 70–99)
Glucose-Capillary: 83 mg/dL (ref 70–99)
Glucose-Capillary: 86 mg/dL (ref 70–99)
Glucose-Capillary: 72 mg/dL (ref 70–99)

## 2021-02-04 NOTE — Plan of Care (Signed)
  Problem: Education: Goal: Knowledge of General Education information will improve Description: Including pain rating scale, medication(s)/side effects and non-pharmacologic comfort measures Outcome: Progressing   Problem: Health Behavior/Discharge Planning: Goal: Ability to manage health-related needs will improve Outcome: Progressing   Problem: Clinical Measurements: Goal: Ability to maintain clinical measurements within normal limits will improve Outcome: Progressing Goal: Will remain free from infection Outcome: Progressing Goal: Diagnostic test results will improve Outcome: Progressing Goal: Respiratory complications will improve Outcome: Progressing Goal: Cardiovascular complication will be avoided Outcome: Progressing   Problem: Coping: Goal: Level of anxiety will decrease Outcome: Progressing   Problem: Nutrition: Goal: Adequate nutrition will be maintained Outcome: Progressing   Problem: Activity: Goal: Risk for activity intolerance will decrease Outcome: Progressing   Problem: Elimination: Goal: Will not experience complications related to bowel motility Outcome: Progressing Goal: Will not experience complications related to urinary retention Outcome: Progressing

## 2021-02-04 NOTE — Progress Notes (Signed)
Ref: Orpah Cobb, MD   Subjective:  VS stable. Afebrile. Improving blood sugar levels with Tradjenta. Patient understood not to keep feet in muddy water for extended period. Improved blood pressure control.  Objective:  Vital Signs in the last 24 hours: Temp:  [97.9 F (36.6 C)-98.2 F (36.8 C)] 98.2 F (36.8 C) (04/16 1642) Pulse Rate:  [52-71] 58 (04/16 1642) Cardiac Rhythm: Normal sinus rhythm (04/16 0701) Resp:  [16-18] 17 (04/16 1642) BP: (146-164)/(70-119) 158/119 (04/16 1642) SpO2:  [91 %-97 %] 93 % (04/16 1642)  Physical Exam: BP Readings from Last 1 Encounters:  02/04/21 (!) 158/119     Wt Readings from Last 1 Encounters:  02/02/21 90.2 kg    Weight change:  Body mass index is 27.73 kg/m. HEENT: [place]/AT, Eyes-Brown, Conjunctiva-Pink, Sclera-Non-icteric Neck: No JVD, No bruit, Trachea midline. Lungs:  Clear, Bilateral. Cardiac:  Regular rhythm, normal S1 and S2, no S3. II/VI systolic murmur. Abdomen:  Soft, non-tender. BS present. Extremities:  1 + right foot edema present. No cyanosis. No clubbing. CNS: AxOx3, Cranial nerves grossly intact, moves all 4 extremities.  Skin: Warm and dry.   Intake/Output from previous day: 04/15 0701 - 04/16 0700 In: -  Out: 700 [Urine:700]    Lab Results: BMET    Component Value Date/Time   NA 138 02/04/2021 0030   NA 138 02/02/2021 0059   NA 140 02/01/2021 1959   K 3.8 02/04/2021 0030   K 3.5 02/02/2021 0059   K 3.6 02/01/2021 1959   CL 110 02/04/2021 0030   CL 106 02/02/2021 0059   CL 107 02/01/2021 1959   CO2 21 (L) 02/04/2021 0030   CO2 27 02/02/2021 0059   CO2 27 02/01/2021 1959   GLUCOSE 90 02/04/2021 0030   GLUCOSE 110 (H) 02/02/2021 0059   GLUCOSE 182 (H) 02/01/2021 1959   BUN 22 02/04/2021 0030   BUN 23 02/02/2021 0059   BUN 23 02/01/2021 1959   CREATININE 1.68 (H) 02/04/2021 0030   CREATININE 1.73 (H) 02/02/2021 0059   CREATININE 1.88 (H) 02/01/2021 1959   CALCIUM 9.2 02/04/2021 0030   CALCIUM  8.8 (L) 02/02/2021 0059   CALCIUM 9.2 02/01/2021 1959   GFRNONAA 45 (L) 02/04/2021 0030   GFRNONAA 43 (L) 02/02/2021 0059   GFRNONAA 39 (L) 02/01/2021 1959   GFRAA 51 (L) 06/29/2020 0530   GFRAA 49 (L) 06/28/2020 0333   GFRAA 54 (L) 06/27/2020 0239   CBC    Component Value Date/Time   WBC 4.1 02/02/2021 0059   RBC 3.85 (L) 02/02/2021 0059   HGB 11.1 (L) 02/02/2021 0059   HCT 33.9 (L) 02/02/2021 0059   PLT 150 02/02/2021 0059   MCV 88.1 02/02/2021 0059   MCH 28.8 02/02/2021 0059   MCHC 32.7 02/02/2021 0059   RDW 13.2 02/02/2021 0059   LYMPHSABS 1.5 02/01/2021 1959   MONOABS 0.5 02/01/2021 1959   EOSABS 0.1 02/01/2021 1959   BASOSABS 0.0 02/01/2021 1959   HEPATIC Function Panel Recent Labs    03/30/20 0342 06/23/20 0110 02/01/21 1959  PROT 5.8* 7.4 6.1*   HEMOGLOBIN A1C No components found for: HGA1C,  MPG CARDIAC ENZYMES Lab Results  Component Value Date   CKTOTAL 107 03/30/2020   BNP No results for input(s): PROBNP in the last 8760 hours. TSH No results for input(s): TSH in the last 8760 hours. CHOLESTEROL Recent Labs    02/02/21 1052  CHOL 156    Scheduled Meds: . amLODipine  5 mg Oral BID  .  aspirin EC  81 mg Oral Daily  . atorvastatin  40 mg Oral QPM  . cloNIDine  0.2 mg Oral QHS  . heparin  5,000 Units Subcutaneous Q8H  . hydrALAZINE  50 mg Oral TID  . insulin aspart  0-15 Units Subcutaneous TID WC  . linagliptin  5 mg Oral Daily  . metoprolol tartrate  25 mg Oral BID  . pneumococcal 23 valent vaccine  0.5 mL Intramuscular Tomorrow-1000  . potassium chloride  10 mEq Oral BID  . sodium chloride flush  3 mL Intravenous Q12H   Continuous Infusions: . cefTRIAXone (ROCEPHIN)  IV 1 g (02/03/21 1952)  . vancomycin 1,250 mg (02/03/21 2051)   PRN Meds:.acetaminophen, albuterol  Assessment/Plan: Cellulitis of right lower leg and foot. CAD Chronic systolic left heart failure COPD HTN Type 2 DM  Continue medical treatment.   LOS: 3 days    Time spent including chart review, lab review, examination, discussion with patient : 25 min   Orpah Cobb  MD  02/04/2021, 4:56 PM

## 2021-02-05 ENCOUNTER — Encounter (HOSPITAL_COMMUNITY): Payer: Self-pay | Admitting: Cardiovascular Disease

## 2021-02-05 LAB — GLUCOSE, CAPILLARY
Glucose-Capillary: 116 mg/dL — ABNORMAL HIGH (ref 70–99)
Glucose-Capillary: 83 mg/dL (ref 70–99)
Glucose-Capillary: 112 mg/dL — ABNORMAL HIGH (ref 70–99)
Glucose-Capillary: 140 mg/dL — ABNORMAL HIGH (ref 70–99)

## 2021-02-05 LAB — VANCOMYCIN, TROUGH: Vancomycin Tr: 9 ug/mL — ABNORMAL LOW (ref 15–20)

## 2021-02-05 MED ORDER — ISOSORBIDE MONONITRATE ER 30 MG PO TB24
30.0000 mg | ORAL_TABLET | Freq: Every day | ORAL | Status: DC
  Administered 2021-02-05 – 2021-02-06 (×2): 30 mg via ORAL
  Filled 2021-02-05 (×2): qty 1

## 2021-02-05 NOTE — Plan of Care (Signed)
  Problem: Education: Goal: Knowledge of General Education information will improve Description: Including pain rating scale, medication(s)/side effects and non-pharmacologic comfort measures Outcome: Progressing   Problem: Education: Goal: Knowledge of General Education information will improve Description: Including pain rating scale, medication(s)/side effects and non-pharmacologic comfort measures Outcome: Progressing   Problem: Health Behavior/Discharge Planning: Goal: Ability to manage health-related needs will improve Outcome: Progressing   Problem: Clinical Measurements: Goal: Ability to maintain clinical measurements within normal limits will improve Outcome: Progressing Goal: Will remain free from infection Outcome: Progressing Goal: Diagnostic test results will improve Outcome: Progressing Goal: Respiratory complications will improve Outcome: Progressing Goal: Cardiovascular complication will be avoided Outcome: Progressing   Problem: Activity: Goal: Risk for activity intolerance will decrease Outcome: Progressing   Problem: Nutrition: Goal: Adequate nutrition will be maintained Outcome: Progressing   Problem: Coping: Goal: Level of anxiety will decrease Outcome: Progressing   Problem: Elimination: Goal: Will not experience complications related to bowel motility Outcome: Progressing Goal: Will not experience complications related to urinary retention Outcome: Progressing   Problem: Pain Managment: Goal: General experience of comfort will improve Outcome: Progressing   Problem: Safety: Goal: Ability to remain free from injury will improve Outcome: Progressing   Problem: Skin Integrity: Goal: Risk for impaired skin integrity will decrease Outcome: Progressing   Problem: Clinical Measurements: Goal: Ability to avoid or minimize complications of infection will improve Outcome: Progressing   Problem: Skin Integrity: Goal: Skin integrity will  improve Outcome: Progressing

## 2021-02-05 NOTE — Progress Notes (Signed)
Pharmacy Antibiotic Note  Phillip Kennedy is a 65 y.o. male admitted on 02/01/2021 with right lower leg edema.  Pharmacy has been consulted for Vancomycin dosing for RLE cellulitis. SCr 1.68, estimated CrCl is 46.7 ml/min  Plan: Continue vancomycin 1250 mg IV Q 24  Hrs Vanc trough ordered for 2130 4/17  Monitor clinical picture, renal function  F/U C&S, abx deescalation  F/u length of therapy    Height: 5\' 11"  (180.3 cm) (02/01/2021) Weight: 90.2 kg (198 lb 13.7 oz) IBW/kg (Calculated) : 75.3  Temp (24hrs), Avg:98.1 F (36.7 C), Min:97.9 F (36.6 C), Max:98.3 F (36.8 C)  Recent Labs  Lab 02/01/21 1959 02/02/21 0059 02/04/21 0030  WBC 4.9 4.1  --   CREATININE 1.88* 1.73* 1.68*    Estimated Creatinine Clearance: 46.7 mL/min (A) (by C-G formula based on SCr of 1.68 mg/dL (H)).    No Known Allergies  Antimicrobials this admission: Vanc 4/13>> Ceftriaxone 4/13>>  Dose adjustments this admission: n/a  Microbiology results: 4/13 BCx: no growth 3 days 4/13 COVID: neg;flu: neg   Thank you for allowing pharmacy to be a part of this patient's care.  5/13, PharmD PGY1 Pharmacy Resident 02/05/2021 2:12 PM

## 2021-02-06 LAB — CULTURE, BLOOD (ROUTINE X 2)
Culture: NO GROWTH
Culture: NO GROWTH
Special Requests: ADEQUATE
Special Requests: ADEQUATE

## 2021-02-06 LAB — GLUCOSE, CAPILLARY: Glucose-Capillary: 87 mg/dL (ref 70–99)

## 2021-02-06 MED ORDER — CEPHALEXIN 500 MG PO CAPS: 500.0000 mg | ORAL_CAPSULE | Freq: Four times a day (QID) | ORAL | 0 refills | Status: DC

## 2021-02-06 MED ORDER — METOPROLOL TARTRATE 25 MG PO TABS: 25.0000 mg | ORAL_TABLET | Freq: Two times a day (BID) | ORAL | 6 refills | Status: DC

## 2021-02-06 MED ORDER — LINAGLIPTIN 5 MG PO TABS: 5.0000 mg | ORAL_TABLET | Freq: Every day | ORAL | 3 refills | Status: DC

## 2021-02-06 MED ORDER — CEPHALEXIN 500 MG PO CAPS
500.0000 mg | ORAL_CAPSULE | Freq: Four times a day (QID) | ORAL | Status: DC
  Administered 2021-02-06: 500 mg via ORAL
  Filled 2021-02-06: qty 1

## 2021-02-06 NOTE — Plan of Care (Signed)
Education completed and goals met for discharge.

## 2021-02-06 NOTE — Discharge Summary (Signed)
Physician Discharge Summary  Patient ID: Phillip Kennedy MRN: [id number] DOB/AGE: 1956-01-05 65 y.o.  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 He has PMH of HTN, type 2 DM, HLD, COPD, tobacco use disorder, substance abuse and osteoarthritis. He was treated with IV vancomycin and Rocephin x 5 days with significant improvement in right lower leg and foot edema. His antihypertensive medications were adjusted to improve BP control. He was started on Tradjenta 5 mg. daily for elevated Hgb A1C. He agrees to use proper precautions to keep his feet and leg dry while working.  Consults: cardiology   Significant Diagnostic Studies:  Lab: Near normal CBC except Hgb of 11.4 gm. Near normal BMET except elevated creatinine of 1.88 and blood glucose of 182 mg.  Lipid panel was normal.  Hgb A1C was 7.0 %  Treatments: antibiotics: vancomycin and ceftriaxone.  Discharge Exam: Blood pressure (!) 145/96, pulse 61, temperature 98.3 F (36.8 C), temperature source Oral, resp. rate 17, height 5\' 11"  (1.803 m), weight 90.2 kg, SpO2 98 %. General appearance: alert, cooperative and appears stated age. Head: Normocephalic, atraumatic. Eyes: Brown eyes, pink conjunctiva, corneas clear. PERRL, EOM's intact.  Neck: No adenopathy, no carotid bruit, no JVD, supple, symmetrical, trachea midline and thyroid not enlarged. Resp: Clear  to auscultation bilaterally. Cardio: Regular rate and rhythm, S1, S2 normal, II/VI systolic murmur, no click, rub or gallop. GI: Soft, non-tender; bowel sounds normal; no organomegaly. Extremities: Trace right foot edema, no cyanosis or clubbing. Skin: Warm and dry.  Neurologic: Alert and oriented X 3, normal strength and tone. Normal coordination and gait.  Disposition: Discharge disposition: 01-Home or Self Care        Allergies as of 02/06/2021   No Known Allergies     Medication List    STOP taking these medications   clopidogrel 75 MG tablet Commonly known as: PLAVIX   metoprolol succinate 50 MG 24 hr tablet Commonly known as: TOPROL-XL     TAKE these medications   acetaminophen 500 MG tablet Commonly known as: TYLENOL Take 1,000 mg by mouth 2 (two) times daily as needed for mild pain.   amLODipine 5 MG tablet Commonly known as: NORVASC Take 1 tablet (5 mg total) by mouth daily.   atorvastatin 40 MG tablet Commonly known as: LIPITOR Take 40 mg by mouth daily.   Bayer Low Dose 81 MG EC tablet Generic drug: aspirin Take 81 mg by mouth in the morning. Swallow whole. What changed: Another medication with the same name was removed. Continue taking this medication, and follow the directions you see here.   cephALEXin 500 MG capsule Commonly known as: KEFLEX Take 1 capsule (500 mg total) by mouth 4 (four) times daily.   cloNIDine 0.2 MG tablet Commonly known as: CATAPRES Take 0.2 mg by mouth at bedtime.   FISH OIL PO Take 1 capsule by  mouth daily.   hydrALAZINE 50 MG tablet Commonly known as: APRESOLINE Take 1 tablet (50 mg total) by mouth 3 (three) times daily.   isosorbide mononitrate 120 MG 24 hr tablet Commonly known as: IMDUR Take 120 mg by mouth at bedtime.   linagliptin 5 MG Tabs tablet Commonly known as: TRADJENTA Take 1 tablet (5 mg total) by mouth daily. Start taking on: February 07, 2021   metoprolol tartrate 25 MG tablet Commonly known as:  LOPRESSOR Take 1 tablet (25 mg total) by mouth 2 (two) times daily.   ProAir HFA 108 (90 Base) MCG/ACT inhaler Generic drug: albuterol Inhale 2 puffs into the lungs every 4 (four) hours as needed for shortness of breath.   VITAMIN D3 PO Take 1 capsule by mouth daily with breakfast.       Follow-up Information    Orpah Cobb, MD. Schedule an appointment as soon as possible for a visit in 1 week(s).   Specialty: Cardiology Contact information: [address] [place] [address] [phone]               Time spent: Review of old chart, current chart, lab, x-ray, cardiac tests and discussion with patient over 60 minutes.  Signed: Ricki Rodriguez 02/06/2021, 9:25 AM

## 2021-02-06 NOTE — Progress Notes (Signed)
Ref: Orpah Cobb, MD  Late Entry:  Subjective:  Awake. Afebrile. Limited ambulation due to telemetry wires hook up to monitor.   Objective:  Vital Signs in the last 24 hours: Temp:  [97.7 F (36.5 C)-98.3 F (36.8 C)] 98.3 F (36.8 C) (04/18 0445) Pulse Rate:  [60-66] 61 (04/18 0445) Resp:  [17-19] 17 (04/18 0445) BP: (140-156)/(96-123) 145/96 (04/18 0445) SpO2:  [98 %-100 %] 98 % (04/18 0445)  Physical Exam: BP Readings from Last 1 Encounters:  02/06/21 (!) 145/96     Wt Readings from Last 1 Encounters:  02/02/21 90.2 kg    Weight change:  Body mass index is 27.73 kg/m. HEENT: [place]/AT, Eyes-Brown, PERL, EOMI, Conjunctiva-Pink, Sclera-Non-icteric Neck: No JVD, No bruit, Trachea midline. Lungs:  Clear, Bilateral. Cardiac:  Regular rhythm, normal S1 and S2, no S3. II/VI systolic murmur. Abdomen:  Soft, non-tender. BS present. Extremities:  1 + right foot edema present. No cyanosis. No clubbing. CNS: AxOx3, Cranial nerves grossly intact, moves all 4 extremities.  Skin: Warm and dry.   Intake/Output from previous day: 04/17 0701 - 04/18 0700 In: 480 [P.O.:480] Out: 300 [Urine:300]    Lab Results: BMET    Component Value Date/Time   NA 138 02/04/2021 0030   NA 138 02/02/2021 0059   NA 140 02/01/2021 1959   K 3.8 02/04/2021 0030   K 3.5 02/02/2021 0059   K 3.6 02/01/2021 1959   CL 110 02/04/2021 0030   CL 106 02/02/2021 0059   CL 107 02/01/2021 1959   CO2 21 (L) 02/04/2021 0030   CO2 27 02/02/2021 0059   CO2 27 02/01/2021 1959   GLUCOSE 90 02/04/2021 0030   GLUCOSE 110 (H) 02/02/2021 0059   GLUCOSE 182 (H) 02/01/2021 1959   BUN 22 02/04/2021 0030   BUN 23 02/02/2021 0059   BUN 23 02/01/2021 1959   CREATININE 1.68 (H) 02/04/2021 0030   CREATININE 1.73 (H) 02/02/2021 0059   CREATININE 1.88 (H) 02/01/2021 1959   CALCIUM 9.2 02/04/2021 0030   CALCIUM 8.8 (L) 02/02/2021 0059   CALCIUM 9.2 02/01/2021 1959   GFRNONAA 45 (L) 02/04/2021 0030   GFRNONAA 43 (L)  02/02/2021 0059   GFRNONAA 39 (L) 02/01/2021 1959   GFRAA 51 (L) 06/29/2020 0530   GFRAA 49 (L) 06/28/2020 0333   GFRAA 54 (L) 06/27/2020 0239   CBC    Component Value Date/Time   WBC 4.1 02/02/2021 0059   RBC 3.85 (L) 02/02/2021 0059   HGB 11.1 (L) 02/02/2021 0059   HCT 33.9 (L) 02/02/2021 0059   PLT 150 02/02/2021 0059   MCV 88.1 02/02/2021 0059   MCH 28.8 02/02/2021 0059   MCHC 32.7 02/02/2021 0059   RDW 13.2 02/02/2021 0059   LYMPHSABS 1.5 02/01/2021 1959   MONOABS 0.5 02/01/2021 1959   EOSABS 0.1 02/01/2021 1959   BASOSABS 0.0 02/01/2021 1959   HEPATIC Function Panel Recent Labs    03/30/20 0342 06/23/20 0110 02/01/21 1959  PROT 5.8* 7.4 6.1*   HEMOGLOBIN A1C No components found for: HGA1C,  MPG CARDIAC ENZYMES Lab Results  Component Value Date   CKTOTAL 107 03/30/2020   BNP No results for input(s): PROBNP in the last 8760 hours. TSH No results for input(s): TSH in the last 8760 hours. CHOLESTEROL Recent Labs    02/02/21 1052  CHOL 156    Scheduled Meds: . amLODipine  5 mg Oral BID  . aspirin EC  81 mg Oral Daily  . atorvastatin  40 mg Oral QPM  .  cephALEXin  500 mg Oral QID  . cloNIDine  0.2 mg Oral QHS  . heparin  5,000 Units Subcutaneous Q8H  . hydrALAZINE  50 mg Oral TID  . insulin aspart  0-15 Units Subcutaneous TID WC  . isosorbide mononitrate  30 mg Oral Daily  . linagliptin  5 mg Oral Daily  . metoprolol tartrate  25 mg Oral BID  . pneumococcal 23 valent vaccine  0.5 mL Intramuscular Tomorrow-1000  . sodium chloride flush  3 mL Intravenous Q12H   Continuous Infusions: PRN Meds:.acetaminophen, albuterol  Assessment/Plan: Cellulitis of right lower leg and foot CAD COPD HTN Type 2 DM Chronic systolic left heart failure  Discussed diet and activity. Remote telemetry to improve ambulation.    LOS: 5 days   Time spent including chart review, lab review, examination, discussion with patient/Nurse : 30 min   Orpah Cobb  MD   02/06/2021, 10:00 AM

## 2021-02-07 ENCOUNTER — Ambulatory Visit (HOSPITAL_COMMUNITY): Admission: RE | Admit: 2021-02-07 | Payer: Medicare Other | Source: Home / Self Care | Admitting: Orthopedic Surgery

## 2021-02-07 ENCOUNTER — Encounter (HOSPITAL_COMMUNITY): Admission: RE | Payer: Self-pay | Source: Home / Self Care

## 2021-02-07 SURGERY — ARTHROPLASTY, KNEE, TOTAL
Anesthesia: Spinal | Site: Knee | Laterality: Right

## 2021-03-17 ENCOUNTER — Other Ambulatory Visit: Payer: Self-pay

## 2021-03-17 ENCOUNTER — Ambulatory Visit (INDEPENDENT_AMBULATORY_CARE_PROVIDER_SITE_OTHER): Payer: Medicare Other | Admitting: Podiatry

## 2021-03-17 DIAGNOSIS — I872 Venous insufficiency (chronic) (peripheral): Secondary | ICD-10-CM | POA: Diagnosis not present

## 2021-03-17 DIAGNOSIS — I890 Lymphedema, not elsewhere classified: Secondary | ICD-10-CM

## 2021-03-17 NOTE — Progress Notes (Signed)
  Subjective:  Patient ID: Phillip Kennedy, male    DOB: 1956/01/08,  MRN: [id number]  Chief Complaint  Patient presents with  . Foot Swelling    Right foot swelling going on for 2-3 years now. Denies pain except for one occurrence went to ER in 2019. Today's sx are not as bad as can be. No injury. Trying to receive knee replacement. Has had [place] in past and no blockages were noted. Has been told in past it was a skin infection and been given Abx and fluid pills.    65 y.o. male presents with the above complaint. History confirmed with patient.   Objective:  Physical Exam: warm, good capillary refill, no trophic changes or ulcerative lesions, normal DP and PT pulses and normal sensory exam. Left Foot: normal exam, no swelling, tenderness, instability; ligaments intact, full range of motion of all ankle/foot joints  Right Foot: significant edema, pitting with skin trophic changes. No warmth, erythema, signs of acute infection noted.   Assessment:   1. Venous (peripheral) insufficiency   2. Lymphedema    Plan:  Patient was evaluated and treated and all questions answered.  Lymphedema vs Venous reflux RLE -Reviewed possible etiologies -Order Ultrasound of right leg to eval for venous reflux. -Will likely benefit from lymphedema pumps.  Return in about 3 weeks (around 04/07/2021) for Right leg swelling f/u.

## 2021-03-22 ENCOUNTER — Other Ambulatory Visit: Payer: Self-pay

## 2021-03-22 ENCOUNTER — Ambulatory Visit (HOSPITAL_COMMUNITY)
Admission: RE | Admit: 2021-03-22 | Discharge: 2021-03-22 | Disposition: A | Discharge status: Home / Self Care | Payer: Medicare Other | Source: Ambulatory Visit | Attending: Podiatry | Admitting: Podiatry

## 2021-03-22 DIAGNOSIS — I872 Venous insufficiency (chronic) (peripheral): Secondary | ICD-10-CM | POA: Insufficient documentation

## 2021-04-07 ENCOUNTER — Ambulatory Visit: Payer: Medicare Other | Admitting: Podiatry

## 2021-04-14 ENCOUNTER — Ambulatory Visit (INDEPENDENT_AMBULATORY_CARE_PROVIDER_SITE_OTHER): Payer: Medicare Other | Admitting: Podiatry

## 2021-04-14 ENCOUNTER — Other Ambulatory Visit: Payer: Self-pay

## 2021-04-14 DIAGNOSIS — I872 Venous insufficiency (chronic) (peripheral): Secondary | ICD-10-CM | POA: Diagnosis not present

## 2021-04-14 DIAGNOSIS — I890 Lymphedema, not elsewhere classified: Secondary | ICD-10-CM | POA: Diagnosis not present

## 2021-04-15 NOTE — Progress Notes (Signed)
Subjective:  Patient ID: Phillip Kennedy, male    DOB: 07-21-1956,  MRN: [id number]  Chief Complaint  Patient presents with   Foot Swelling    Pt states swelling has not changed. Begins "when my foot touches the floor"   65 y.o. male presents with the above complaint. History confirmed with patient.   Objective:  Physical Exam: warm, good capillary refill, no trophic changes or ulcerative lesions, normal DP and PT pulses and normal sensory exam. Left Foot: normal exam, no swelling, tenderness, instability; ligaments intact, full range of motion of all ankle/foot joints  Right Foot: significant edema, pitting with skin trophic changes. No warmth, erythema, signs of acute infection noted.   [name]  Date of Exam:   03/22/2021  Procedure:      VAS [place] LOWER EXTREMITY VENOUS REFLUX  Referring Phys: [id number] Brittian J Darcel Frane     Indications: Edema, Swelling, and Pain.     Performing Technologist: Hardie Lora RVT     Examination Guidelines: A complete evaluation includes B-mode imaging,  spectral  Doppler, color Doppler, and power Doppler as needed of all accessible  portions  of each vessel. Bilateral testing is considered an integral part of a  complete  examination. Limited examinations for reoccurring indications may be  performed  as noted. The reflux portion of the exam is performed with the patient in  reverse Trendelenburg.  Significant venous reflux is defined as >500 ms in the superficial venous  system, and >1 second in the deep venous system.      Venous Reflux Times  +--------------+---------+------+-----------+------------+--------+  RIGHT         Reflux NoRefluxReflux TimeDiameter cmsComments                          Yes                                   +--------------+---------+------+-----------+------------+--------+  CFV                     yes   >1 second                        +--------------+---------+------+-----------+------------+--------+  FV prox       no                                              +--------------+---------+------+-----------+------------+--------+  FV mid        no                                              +--------------+---------+------+-----------+------------+--------+  FV dist       no                                              +--------------+---------+------+-----------+------------+--------+  Popliteal               yes   >1 second                       +--------------+---------+------+-----------+------------+--------+  GSV at [facility]              yes    >500 ms     0.561              +--------------+---------+------+-----------+------------+--------+  GSV prox thighno                           0.447              +--------------+---------+------+-----------+------------+--------+  GSV mid thigh no                           0.383              +--------------+---------+------+-----------+------------+--------+  GSV dist thighno                           0.393              +--------------+---------+------+-----------+------------+--------+  GSV at knee   no                           0.407              +--------------+---------+------+-----------+------------+--------+  GSV prox calf                              0.434              +--------------+---------+------+-----------+------------+--------+  GSV mid calf                               0.330              +--------------+---------+------+-----------+------------+--------+  SSV Pop Fossa no                           0.260              +--------------+---------+------+-----------+------------+--------+  SSV prox calf           yes    >500 ms     0.206              +--------------+---------+------+-----------+------------+--------+  SSV mid calf                               0.228               +--------------+---------+------+-----------+------------+--------+   Summary:  Right:  - No evidence of deep vein thrombosis seen in the right lower extremity,  from the common femoral through the popliteal veins.  - No evidence of superficial venous thrombosis in the right lower  extremity.  - Venous reflux is noted in the right common femoral vein.  - Venous reflux is noted in the right sapheno-femoral junction.  - Venous reflux is noted in the right popliteal vein.  - Venous reflux is noted in the right short saphenous vein.  - Enlarged lymph node at inguinal crease.     *See table(s) above for measurements and observations.     Assessment:   1. Venous (peripheral) insufficiency   2. Lymphedema    Plan:  Patient was evaluated and treated and all questions answered.  Lymphedema vs Venous reflux RLE -Venous  Reflux Reviewed. He does have significant venous reflux. Possible some element of lymphedema. -Will refer to vein specialist for further evaluation. -Discussed the importance of elevation of the leg as well as use of compression socks. Tubigrip dispensed.  Return in about 6 weeks (around 05/26/2021) for Venous Insufficiency.

## 2021-04-18 ENCOUNTER — Other Ambulatory Visit: Payer: Self-pay | Admitting: Orthopedic Surgery

## 2021-04-26 NOTE — Progress Notes (Addendum)
PCP -    and Cardiologist  Dr. Orpah Cobb  LOV 02-05-21 Cardiologist -  Pulm- dr. Russella Dar [name]-LOV  12-22-20 PPM/ICD -  Device Orders -  Rep Notified -   Chest x-ray - 08-31-20 EKG - 02-02-21 Stress Test -  ECHO - 2021 Cardiac Cath - 06-28-20 epic  Sleep Study -  CPAP -   Fasting Blood Sugar -  Checks Blood Sugar _____ times a day  Blood Thinner Instructions: Aspirin Instructions: 81 mg asa stop 1 week  ERAS Protcol - PRE-SURGERY Ensure or G2-   COVID TEST- 05-19-21  Activity--Able to walk a flight of stairs without SOB  Anesthesia review: CAD,CHF,COPD, DM, HTN,MI, Creatine 1.87  Patient denies shortness of breath, fever, cough and chest pain at PAT appointment   All instructions explained to the patient, with a verbal understanding of the material. Patient agrees to go over the instructions while at home for a better understanding. Patient also instructed to self quarantine after being tested for COVID-19. The opportunity to ask questions was provided.

## 2021-04-26 NOTE — Progress Notes (Signed)
Please place orders in epic pt is scheduled for preop 

## 2021-04-26 NOTE — Patient Instructions (Addendum)
DUE TO COVID-19 ONLY ONE VISITOR IS ALLOWED TO COME WITH YOU AND STAY IN THE WAITING ROOM ONLY DURING PRE OP AND PROCEDURE DAY OF SURGERY.   TWO VISITOR  MAY VISIT WITH YOU AFTER SURGERY IN YOUR PRIVATE ROOM DURING VISITING HOURS ONLY!  YOU NEED TO HAVE A COVID 19 TEST ON_7-29-22______ @_______ , THIS TEST MUST BE DONE BEFORE SURGERY,    COVID TESTING SITE [address] [place] ,   IT IS ON THE RIGHT GOING OUT [address] APPROXIMATELY  2 MINUTES PAST [employer] ON THE RIGHT. ONCE YOUR COVID TEST IS COMPLETED,  PLEASE Wear a mask when in public           Your procedure is scheduled on: 05-23-21   Report to [facility] Main  Entrance   Report to admitting at     0730 AM     Call this number if you have problems the morning of surgery [phone]    Remember: NO SOLID FOOD AFTER MIDNIGHT THE NIGHT PRIOR TO SURGERY. NOTHING BY MOUTH EXCEPT CLEAR LIQUIDS UNTIL    0700 am.   PLEASE FINISH G2 DRINK PER SURGEON ORDER  WHICH NEEDS TO BE COMPLETED AT    0700 am then nothing by mouth.    BRUSH YOUR TEETH MORNING OF SURGERY AND RINSE YOUR MOUTH OUT, NO CHEWING GUM CANDY OR MINTS.     Take these medicines the morning of surgery with A SIP OF WATER: inhaler and bring with you, metoprolol, hydralazine, clonidine, amlodipine  DO NOT TAKE ANY DIABETIC MEDICATIONS DAY OF YOUR SURGERY                               You may not have any metal on your body including hair pins and              piercings  Do not wear jewelry, lotions, powders or perfumes, deodorant                        Men may shave face and neck.   Do not bring valuables to the hospital. [place] IS NOT             RESPONSIBLE   FOR VALUABLES.  Contacts, dentures or bridgework may not be worn into surgery.       Patients discharged the day of surgery will not be allowed to drive home. IF YOU ARE HAVING SURGERY AND GOING HOME THE SAME DAY, YOU MUST HAVE AN ADULT TO DRIVE YOU HOME AND  BE WITH YOU FOR 24 HOURS. YOU MAY GO HOME BY TAXI OR UBER OR ORTHERWISE, BUT AN ADULT MUST ACCOMPANY YOU HOME AND STAY WITH YOU FOR 24 HOURS.  Name and phone number of your driver:  Special Instructions: N/A              Please read over the following fact sheets you were given: _____________________________________________________________________             [facility] - Preparing for Surgery Before surgery, you can play an important role.  Because skin is not sterile, your skin needs to be as free of germs as possible.  You can reduce the number of germs on your skin by washing with CHG (chlorahexidine gluconate) soap before surgery.  CHG is an antiseptic cleaner which kills germs and bonds with the skin to continue killing germs even  after washing. Please DO NOT use if you have an allergy to CHG or antibacterial soaps.  If your skin becomes reddened/irritated stop using the CHG and inform your nurse when you arrive at Short Stay. Do not shave (including legs and underarms) for at least 48 hours prior to the first CHG shower.  You may shave your face/neck. Please follow these instructions carefully:  1.  Shower with CHG Soap the night before surgery and the  morning of Surgery.  2.  If you choose to wash your hair, wash your hair first as usual with your  normal  shampoo.  3.  After you shampoo, rinse your hair and body thoroughly to remove the  shampoo.                           4.  Use CHG as you would any other liquid soap.  You can apply chg directly  to the skin and wash                       Gently with a scrungie or clean washcloth.  5.  Apply the CHG Soap to your body ONLY FROM THE NECK DOWN.   Do not use on face/ open                           Wound or open sores. Avoid contact with eyes, ears mouth and genitals (private parts).                       Wash face,  Genitals (private parts) with your normal soap.             6.  Wash thoroughly, paying special attention to the area where  your surgery  will be performed.  7.  Thoroughly rinse your body with warm water from the neck down.  8.  DO NOT shower/wash with your normal soap after using and rinsing off  the CHG Soap.                9.  Pat yourself dry with a clean towel.            10.  Wear clean pajamas.            11.  Place clean sheets on your bed the night of your first shower and do not  sleep with pets. Day of Surgery : Do not apply any lotions/deodorants the morning of surgery.  Please wear clean clothes to the hospital/surgery center.  FAILURE TO FOLLOW THESE INSTRUCTIONS MAY RESULT IN THE CANCELLATION OF YOUR SURGERY PATIENT SIGNATURE_________________________________  NURSE SIGNATURE__________________________________   Incentive Spirometer  An incentive spirometer is a tool that can help keep your lungs clear and active. This tool measures how well you are filling your lungs with each breath. Taking long deep breaths may help reverse or decrease the chance of developing breathing (pulmonary) problems (especially infection) following: A long period of time when you are unable to move or be active. BEFORE THE PROCEDURE  If the spirometer includes an indicator to show your best effort, your nurse or respiratory therapist will set it to a desired goal. If possible, sit up straight or lean slightly forward. Try not to slouch. Hold the incentive spirometer in an upright position. INSTRUCTIONS FOR USE  Sit on the edge of your bed if possible, or sit up  as far as you can in bed or on a chair. Hold the incentive spirometer in an upright position. Breathe out normally. Place the mouthpiece in your mouth and seal your lips tightly around it. Breathe in slowly and as deeply as possible, raising the piston or the ball toward the top of the column. Hold your breath for 3-5 seconds or for as long as possible. Allow the piston or ball to fall to the bottom of the column. Remove the mouthpiece from your mouth and  breathe out normally. Rest for a few seconds and repeat Steps 1 through 7 at least 10 times every 1-2 hours when you are awake. Take your time and take a few normal breaths between deep breaths. The spirometer may include an indicator to show your best effort. Use the indicator as a goal to work toward during each repetition. After each set of 10 deep breaths, practice coughing to be sure your lungs are clear. If you have an incision (the cut made at the time of surgery), support your incision when coughing by placing a pillow or rolled up towels firmly against it. Once you are able to get out of bed, walk around indoors and cough well. You may stop using the incentive spirometer when instructed by your caregiver.  RISKS AND COMPLICATIONS Take your time so you do not get dizzy or light-headed. If you are in pain, you may need to take or ask for pain medication before doing incentive spirometry. It is harder to take a deep breath if you are having pain. AFTER USE Rest and breathe slowly and easily. It can be helpful to keep track of a log of your progress. Your caregiver can provide you with a simple table to help with this. If you are using the spirometer at home, follow these instructions: SEEK MEDICAL CARE IF:  You are having difficultly using the spirometer. You have trouble using the spirometer as often as instructed. Your pain medication is not giving enough relief while using the spirometer. You develop fever of 100.5 F (38.1 C) or higher. SEEK IMMEDIATE MEDICAL CARE IF:  You cough up bloody sputum that had not been present before. You develop fever of 102 F (38.9 C) or greater. You develop worsening pain at or near the incision site. MAKE SURE YOU:  Understand these instructions. Will watch your condition. Will get help right away if you are not doing well or get worse. Document Released: 02/18/2007 Document Revised: 12/31/2011 Document Reviewed: 04/21/2007 [facility] Patient  Information 2014 [employer], [place].   ________________________________________________________________________  ________________________________________________________________________

## 2021-05-10 ENCOUNTER — Encounter (HOSPITAL_COMMUNITY): Payer: Self-pay

## 2021-05-10 ENCOUNTER — Encounter (HOSPITAL_COMMUNITY)
Admission: RE | Admit: 2021-05-10 | Discharge: 2021-05-10 | Disposition: A | Discharge status: Home / Self Care | Payer: Medicare Other | Source: Ambulatory Visit | Attending: Orthopedic Surgery | Admitting: Orthopedic Surgery

## 2021-05-10 ENCOUNTER — Other Ambulatory Visit: Payer: Self-pay

## 2021-05-10 DIAGNOSIS — Z01812 Encounter for preprocedural laboratory examination: Secondary | ICD-10-CM | POA: Insufficient documentation

## 2021-05-10 HISTORY — DX: Type 2 diabetes mellitus without complications: E11.9

## 2021-05-10 HISTORY — DX: Chronic kidney disease, unspecified: N18.9

## 2021-05-10 HISTORY — DX: Heart failure, unspecified: I50.9

## 2021-05-10 LAB — HEMOGLOBIN A1C
Hgb A1c MFr Bld: 6.7 % — ABNORMAL HIGH (ref 4.8–5.6)
Mean Plasma Glucose: 145.59 mg/dL

## 2021-05-10 LAB — CBC
HCT: 37.6 % — ABNORMAL LOW (ref 39.0–52.0)
Hemoglobin: 11.8 g/dL — ABNORMAL LOW (ref 13.0–17.0)
MCH: 28.4 pg (ref 26.0–34.0)
MCHC: 31.4 g/dL (ref 30.0–36.0)
MCV: 90.6 fL (ref 80.0–100.0)
Platelets: 240 10*3/uL (ref 150–400)
RBC: 4.15 MIL/uL — ABNORMAL LOW (ref 4.22–5.81)
RDW: 14.1 % (ref 11.5–15.5)
WBC: 7.4 10*3/uL (ref 4.0–10.5)
nRBC: 0 % (ref 0.0–0.2)

## 2021-05-10 LAB — BASIC METABOLIC PANEL
Anion gap: 6 (ref 5–15)
BUN: 33 mg/dL — ABNORMAL HIGH (ref 8–23)
CO2: 28 mmol/L (ref 22–32)
Calcium: 9.6 mg/dL (ref 8.9–10.3)
Chloride: 108 mmol/L (ref 98–111)
Creatinine, Ser: 1.87 mg/dL — ABNORMAL HIGH (ref 0.61–1.24)
GFR, Estimated: 39 mL/min — ABNORMAL LOW (ref >60)
Glucose, Bld: 107 mg/dL — ABNORMAL HIGH (ref 70–99)
Potassium: 4 mmol/L (ref 3.5–5.1)
Sodium: 142 mmol/L (ref 135–145)

## 2021-05-10 LAB — GLUCOSE, CAPILLARY: Glucose-Capillary: 99 mg/dL (ref 70–99)

## 2021-05-10 LAB — SURGICAL PCR SCREEN
MRSA, PCR: NEGATIVE
Staphylococcus aureus: NEGATIVE

## 2021-05-12 NOTE — Progress Notes (Addendum)
Anesthesia Chart Review   Case: [id number] Date/Time: 05/23/21 0945   Procedure: TOTAL KNEE ARTHROPLASTY (Right: Knee)   Anesthesia type: Choice   Pre-op diagnosis: OA RIGHT KNEE   Location: [facility] ROOM 08 / [facility]   Surgeons: Sheral Apley, MD       DISCUSSION:65 y.o. every day smoke with [name], COPD, CHF, DM II, CKD, [name] scheduled for above procedure 05/23/21 with Dr. Margarita Rana.   Per pulmonology note 12/28/2020, "Ok for surgery- he has only mild copd."  Per cardiology clearance pt is optimized for surgery from medical and cardiac standpoint.  VS: BP (!) 163/84   Pulse (!) 56   Temp 36.9 C (Oral)   Resp 16   Ht 5\' 11"  (1.803 m)   Wt 82.1 kg   SpO2 100%   BMI 25.24 kg/m   PROVIDERS: , MD is PCP    LABS: Labs reviewed: Acceptable for surgery. (all labs ordered are listed, but only abnormal results are displayed)  Labs Reviewed  BASIC METABOLIC PANEL - Abnormal; Notable for the following components:      Result Value   Glucose, Bld 107 (*)    BUN 33 (*)    Creatinine, Ser 1.87 (*)    GFR, Estimated 39 (*)    All other components within normal limits  CBC - Abnormal; Notable for the following components:   RBC 4.15 (*)    Hemoglobin 11.8 (*)    HCT 37.6 (*)    All other components within normal limits  HEMOGLOBIN A1C - Abnormal; Notable for the following components:   Hgb A1c MFr Bld 6.7 (*)    All other components within normal limits  SURGICAL PCR SCREEN  GLUCOSE, CAPILLARY     IMAGES:   EKG: 02/02/2021 Rate 69 bpm  Sinus rhythm with occasional Premature ventricular complexes T wave abnormality, consider lateral ischemia Prolonged QT Abnormal ECG  CV: Echo 06/23/2020  1. Left ventricular ejection fraction, by estimation, is 25 to 30%. The  left ventricle has severely decreased function. The left ventricle  demonstrates global hypokinesis. The left ventricular internal cavity size  was mildly dilated. Left ventricular   diastolic parameters are consistent with Grade I diastolic dysfunction  (impaired relaxation).   2. Right ventricular systolic function is mildly reduced. The right  ventricular size is normal.   3. Left atrial size was moderately dilated.   4. Right atrial size was mildly dilated.   5. The mitral valve is normal in structure. Mild mitral valve  regurgitation.   6. The aortic valve is tricuspid. Aortic valve regurgitation is not  visualized. Mild to moderate aortic valve sclerosis/calcification is  present, without any evidence of aortic stenosis.   7. There is mild (Grade II) atheroma plaque involving the ascending  aorta.   8. The inferior vena cava is dilated in size with <50% respiratory  variability, suggesting right atrial pressure of 15 mmHg.   Cardiac Cath 06/28/2020 Prox RCA lesion is 40% stenosed. Prox LAD lesion is 30% stenosed. Mid Cx to Dist Cx lesion is 40% stenosed. 1st Diag lesion is 30% stenosed. 2nd Diag lesion is 50% stenosed. Dist LAD lesion is 50% stenosed.   Medical treatment with life style modification. Past Medical History:  Diagnosis Date   Arthritis    "knees and back" (12/24/2017)   CHF (congestive heart failure) (HCC)    Chronic bronchitis (HCC)    Chronic kidney disease    COPD (chronic obstructive pulmonary disease) (HCC)  Gold 0   Diabetes mellitus without complication (HCC)    High cholesterol    Hypertension     Past Surgical History:  Procedure Laterality Date   CORONARY ANGIOGRAPHY N/A 06/28/2020   Procedure: CORONARY ANGIOGRAPHY (CATH LAB);  Surgeon: Orpah Cobb, MD;  Location: [facility] INVASIVE CV LAB;  Service: Cardiovascular;  Laterality: N/A;   JOINT REPLACEMENT     KNEE CARTILAGE SURGERY Bilateral    MULTIPLE TOOTH EXTRACTIONS     TOTAL KNEE ARTHROPLASTY Left 12/24/2017   TOTAL KNEE ARTHROPLASTY Left 12/24/2017   Procedure: LEFT TOTAL KNEE ARTHROPLASTY;  Surgeon: Sheral Apley, MD;  Location: [facility] OR;  Service: Orthopedics;   Laterality: Left;    MEDICATIONS:  acetaminophen (TYLENOL) 500 MG tablet   amLODipine (NORVASC) 5 MG tablet   atorvastatin (LIPITOR) 40 MG tablet   BAYER LOW DOSE 81 MG EC tablet   cephALEXin (KEFLEX) 500 MG capsule   Cholecalciferol (VITAMIN D3 PO)   cloNIDine (CATAPRES) 0.2 MG tablet   Cyanocobalamin (B-12 PO)   hydrALAZINE (APRESOLINE) 50 MG tablet   hydrocortisone cream 1 %   isosorbide mononitrate (IMDUR) 120 MG 24 hr tablet   linagliptin (TRADJENTA) 5 MG TABS tablet   Menthol, Topical Analgesic, (BIOFREEZE EX)   metoprolol tartrate (LOPRESSOR) 25 MG tablet   Omega-3 Fatty Acids (FISH OIL PO)   PROAIR HFA 108 (90 Base) MCG/ACT inhaler   torsemide (DEMADEX) 20 MG tablet   No current facility-administered medications for this encounter.    Phillip Cipro, PA-C [facility] Pre-Surgical Testing [phone]

## 2021-05-15 NOTE — H&P (Signed)
KNEE ARTHROPLASTY ADMISSION H&P  Patient ID: Phillip Kennedy MRN: [id number] DOB/AGE: 65-30-1957 65 y.o.  Chief Complaint: right knee pain.  Planned Procedure Date: 05/23/21 Medical Clearance by Dr. Algie Coffer   Cardiac Clearance by Dr. Algie Coffer Pulmonary clearance by Dr. Sherene Sires   HPI: Phillip Kennedy is a 65 y.o. male who presents for evaluation of OA RIGHT KNEE. The patient has a history of pain and functional disability in the right knee due to arthritis and has failed non-surgical conservative treatments for greater than 12 weeks to include NSAID's and/or analgesics, corticosteriod injections, supervised PT with diminished ADL's post treatment, use of assistive devices, and activity modification.  Onset of symptoms was gradual, starting 10 years ago with gradually worsening course since that time. The patient noted prior procedures on the knee to include  patella tendon repair  on the bilaterally knees.  Patient currently rates pain at 4 out of 10 with activity. Patient has night pain, worsening of pain with activity and weight bearing, and pain that interferes with activities of daily living.  Patient has evidence of subchondral sclerosis, periarticular osteophytes, and joint space narrowing by imaging studies.  There is no active infection.  Past Medical History:  Diagnosis Date   Arthritis    "knees and back" (12/24/2017)   CHF (congestive heart failure) (HCC)    Chronic bronchitis (HCC)    Chronic kidney disease    COPD (chronic obstructive pulmonary disease) (HCC)    Gold 0   Diabetes mellitus without complication (HCC)    High cholesterol    Hypertension    Past Surgical History:  Procedure Laterality Date   CORONARY ANGIOGRAPHY N/A 06/28/2020   Procedure: CORONARY ANGIOGRAPHY (CATH LAB);  Surgeon: Orpah Cobb, MD;  Location: [facility] INVASIVE CV LAB;  Service: Cardiovascular;  Laterality: N/A;   JOINT REPLACEMENT     KNEE CARTILAGE SURGERY Bilateral    MULTIPLE TOOTH EXTRACTIONS      TOTAL KNEE ARTHROPLASTY Left 12/24/2017   TOTAL KNEE ARTHROPLASTY Left 12/24/2017   Procedure: LEFT TOTAL KNEE ARTHROPLASTY;  Surgeon: Sheral Apley, MD;  Location: [facility] OR;  Service: Orthopedics;  Laterality: Left;   No Known Allergies Prior to Admission medications   Medication Sig Start Date End Date Taking? Authorizing Provider  acetaminophen (TYLENOL) 500 MG tablet Take 1,000 mg by mouth every 6 (six) hours as needed for mild pain.   Yes [provider]  amLODipine (NORVASC) 5 MG tablet Take 1 tablet (5 mg total) by mouth daily. 03/31/20  Yes Orpah Cobb, MD  atorvastatin (LIPITOR) 40 MG tablet Take 40 mg by mouth every evening. 02/16/20  Yes [provider]  BAYER LOW DOSE 81 MG EC tablet Take 81 mg by mouth in the morning. Swallow whole.   Yes [provider]  Cholecalciferol (VITAMIN D3 PO) Take 1 capsule by mouth daily with breakfast.   Yes [provider]  cloNIDine (CATAPRES) 0.2 MG tablet Take 0.2 mg by mouth at bedtime.   Yes [provider]  Cyanocobalamin (B-12 PO) Take 1 tablet by mouth daily.   Yes [provider]  hydrALAZINE (APRESOLINE) 50 MG tablet Take 1 tablet (50 mg total) by mouth 3 (three) times daily. 06/29/20  Yes Lanae Boast, MD  hydrocortisone cream 1 % Apply 1 application topically daily as needed for itching.   Yes [provider]  isosorbide mononitrate (IMDUR) 120 MG 24 hr tablet Take 120 mg by mouth at bedtime.   Yes [provider]  linagliptin (TRADJENTA)  5 MG TABS tablet Take 1 tablet (5 mg total) by mouth daily. 02/07/21  Yes Orpah Cobb, MD  Menthol, Topical Analgesic, (BIOFREEZE EX) Apply 1 application topically daily.   Yes [provider]  metoprolol tartrate (LOPRESSOR) 25 MG tablet Take 1 tablet (25 mg total) by mouth 2 (two) times daily. 02/06/21  Yes Orpah Cobb, MD  Omega-3 Fatty Acids (FISH OIL PO) Take 1 capsule by mouth daily.   Yes [provider]  PROAIR  HFA 108 (90 Base) MCG/ACT inhaler Inhale 2 puffs into the lungs every 4 (four) hours as needed for shortness of breath. 12/22/20  Yes Nyoka Cowden, MD  torsemide (DEMADEX) 20 MG tablet Take 20 mg by mouth See admin instructions. Take 20 mg by mouth on Monday and Thursday as needed for fluid 02/28/21  Yes [provider]  cephALEXin (KEFLEX) 500 MG capsule Take 1 capsule (500 mg total) by mouth 4 (four) times daily. Patient not taking: Reported on 05/05/2021 02/06/21   Orpah Cobb, MD   Social History   Socioeconomic History   Marital status: Married    Spouse name: Not on file   Number of children: Not on file   Years of education: Not on file   Highest education level: Not on file  Occupational History   Not on file  Tobacco Use   Smoking status: Every Day    Packs/day: 0.25    Years: 49.00    Pack years: 12.25    Types: Cigarettes   Smokeless tobacco: Never  Vaping Use   Vaping Use: Never used  Substance and Sexual Activity   Alcohol use: No   Drug use: Not Currently    Types: Cocaine    Comment: last used 3 months   Sexual activity: Yes    Birth control/protection: None  Other Topics Concern   Not on file  Social History Narrative   ** Merged History Encounter **       Social Determinants of Health   Financial Resource Strain: Not on file  Food Insecurity: Not on file  Transportation Needs: Not on file  Physical Activity: Not on file  Stress: Not on file  Social Connections: Not on file   No family history on file.  ROS: Currently denies lightheadedness, dizziness, Fever, chills, CP, SOB.   No personal history of DVT, PE, MI, or CVA.  He does have a history of cardiac catheterization that did not find any blockages sufficient enough to need stent placement No loose teeth. Upper dentures present All other systems have been reviewed and were otherwise currently negative with the exception of those mentioned in the HPI and as above.  Objective: Vitals:  Ht: 5'11" Wt: 184.4 lbs Temp: 98.1 BP: 132/64 Pulse: 61 O2 99% on room air.   Physical Exam: General: Alert, NAD.  Antalgic Gait  HEENT: EOMI, Good Neck Extension  Pulm: No increased work of breathing. Scattered rhonchi B/L CV: RRR, No m/g/r appreciated  GI: soft, NT, ND. Normal BS Neuro: CN II-XII grossly intact without focal deficit.  Sensation intact distally Skin: No lesions in the area of chief complaint. Well healed surgical incision on anterior portion of right knee from previous procedure MSK/Surgical Site: Knee pain with ROM. Mild Right foot and ankle edema  No JLT. ROM 15-90 degrees.  5/5 strength in extension and flexion.  +EHL/FHL.  NVI.  Stable varus and valgus stress. Calf is soft and compressible   Imaging Review Plain radiographs demonstrate severe degenerative joint disease  of the right knee.   The overall alignment ismild valgus. The bone quality appears to be fair for age and reported activity level.  Preoperative templating of the joint replacement has been completed, documented, and submitted to the Operating Room personnel in order to optimize intra-operative equipment management.  Assessment: OA RIGHT KNEE Active Problems:   Osteoarthritis of right knee   Plan: Plan for Procedure(s): TOTAL KNEE ARTHROPLASTY  The patient history, physical exam, clinical judgement of the provider and imaging are consistent with end stage degenerative joint disease and total joint arthroplasty is deemed medically necessary. The treatment options including medical management, injection therapy, and arthroplasty were discussed at length. The risks and benefits of Procedure(s): TOTAL KNEE ARTHROPLASTY were presented and reviewed.  The risks of nonoperative treatment, versus surgical intervention including but not limited to continued pain, aseptic loosening, stiffness, dislocation/subluxation, infection, bleeding, nerve injury, blood clots, cardiopulmonary complications, morbidity,  mortality, among others were discussed. The patient verbalizes understanding and wishes to proceed with the plan.  Patient is being admitted for inpatient treatment for surgery, pain control, PT, prophylactic antibiotics, VTE prophylaxis, progressive ambulation, ADL's and discharge planning. He will spend the night in observation.  Dental prophylaxis discussed and recommended for 2 years postoperatively.  The patient does meet the criteria for TXA which will be used perioperatively.   ASA 81 mg BID will be used postoperatively for DVT prophylaxis in addition to SCDs, and early ambulation. Plan for Tylenol and oxycodone for pain. Avoid NSAIDs and anti-inflammatories due to kidney disease. Robaxin for muscle spasms.   Zofran for nausea and vomiting.  Omeprazole for gastric protection. Pharmacy- [facility] [address] The patient is planning to be discharged home with OPPT and in to the care of his wife Nicole Cella who can be reached at [phone]. Follow up appointment 06/07/21 at 4:15pm    [place] Board Office [phone] 05/15/2021 3:17 PM

## 2021-05-19 ENCOUNTER — Other Ambulatory Visit (HOSPITAL_COMMUNITY): Payer: Medicare Other

## 2021-05-23 DIAGNOSIS — M1711 Unilateral primary osteoarthritis, right knee: Secondary | ICD-10-CM | POA: Diagnosis present

## 2021-05-26 ENCOUNTER — Ambulatory Visit (INDEPENDENT_AMBULATORY_CARE_PROVIDER_SITE_OTHER): Payer: Medicare Other | Admitting: Podiatry

## 2021-05-26 ENCOUNTER — Other Ambulatory Visit: Payer: Self-pay | Admitting: Orthopedic Surgery

## 2021-05-26 ENCOUNTER — Other Ambulatory Visit: Payer: Self-pay

## 2021-05-26 DIAGNOSIS — I872 Venous insufficiency (chronic) (peripheral): Secondary | ICD-10-CM | POA: Diagnosis not present

## 2021-05-26 DIAGNOSIS — I890 Lymphedema, not elsewhere classified: Secondary | ICD-10-CM | POA: Diagnosis not present

## 2021-05-26 NOTE — Progress Notes (Signed)
  Subjective:  Patient ID: Phillip Kennedy, male    DOB: Nov 28, 1955,  MRN: [id number]  Chief Complaint  Patient presents with   Foot Swelling    Right leg swelling f/u   65 y.o. male presents with the above complaint. History confirmed with patient.   Objective:  Physical Exam: warm, good capillary refill, no trophic changes or ulcerative lesions, normal DP and PT pulses and normal sensory exam. Left Foot: normal exam, no swelling, tenderness, instability; ligaments intact, full range of motion of all ankle/foot joints  Right Foot: significant edema, pitting with skin trophic changes. No warmth, erythema, signs of acute infection noted.   Assessment:   1. Venous (peripheral) insufficiency   2. Lymphedema   3. Venous reflux     Plan:  Patient was evaluated and treated and all questions answered.  Lymphedema vs Venous reflux RLE -He does find the swelling improving when he stays off of it. He is having Knee surgery on Tuesday. I did advise the swelling is likely to worsen after this. -He has pending referral to the vein center. He did not make the appt when previously called. I gave him the number to call back to make new appt. We will follow up in 2 months to see what their recommendations are.  No follow-ups on file.

## 2021-05-29 LAB — SARS CORONAVIRUS 2 (TAT 6-24 HRS): SARS Coronavirus 2: NEGATIVE

## 2021-05-29 NOTE — Anesthesia Preprocedure Evaluation (Addendum)
Anesthesia Evaluation  Patient identified by MRN, date of birth, ID band Patient awake    Reviewed: Allergy & Precautions, H&P , NPO status , Patient's Chart, lab work & pertinent test results  Airway Mallampati: II  TM Distance: >3 FB Neck ROM: Full    Dental no notable dental hx. (+) Edentulous Upper, Partial Lower, Dental Advisory Given   Pulmonary COPD, Current Smoker,    Pulmonary exam normal breath sounds clear to auscultation       Cardiovascular hypertension, Pt. on medications and Pt. on home beta blockers +CHF   Rhythm:Regular Rate:Normal  1. Left ventricular ejection fraction, by estimation, is 25 to 30%. The  left ventricle has severely decreased function. The left ventricle  demonstrates global hypokinesis. The left ventricular internal cavity size  was mildly dilated. Left ventricular  diastolic parameters are consistent with Grade I diastolic dysfunction  (impaired relaxation).  2. Right ventricular systolic function is mildly reduced. The right  ventricular size is normal.  3. Left atrial size was moderately dilated.  4. Right atrial size was mildly dilated.  5. The mitral valve is normal in structure. Mild mitral valve  regurgitation.  6. The aortic valve is tricuspid. Aortic valve regurgitation is not  visualized. Mild to moderate aortic valve sclerosis/calcification is  present, without any evidence of aortic stenosis.  7. There is mild (Grade II) atheroma plaque involving the ascending  aorta.  8. The inferior vena cava is dilated in size with <50% respiratory  variability, suggesting right atrial pressure of 15 mmHg.    Neuro/Psych negative neurological ROS  negative psych ROS   GI/Hepatic negative GI ROS, Neg liver ROS,   Endo/Other  negative endocrine ROSdiabetes  Renal/GU negative Renal ROS  negative genitourinary   Musculoskeletal  (+) Arthritis , Osteoarthritis,    Abdominal    Peds  Hematology negative hematology ROS (+)   Anesthesia Other Findings Patient very somnolent but arouses easily and answers questions appropriately  Reproductive/Obstetrics negative OB ROS                            Anesthesia Physical  Anesthesia Plan  ASA: 4  Anesthesia Plan: Spinal, Regional and MAC   Post-op Pain Management:  Regional for Post-op pain   Induction: Intravenous  PONV Risk Score and Plan: 1 and Propofol infusion, Ondansetron and Dexamethasone  Airway Management Planned: Simple Face Mask  Additional Equipment:   Intra-op Plan:   Post-operative Plan:   Informed Consent: I have reviewed the patients History and Physical, chart, labs and discussed the procedure including the risks, benefits and alternatives for the proposed anesthesia with the patient or authorized representative who has indicated his/her understanding and acceptance.     Dental advisory given  Plan Discussed with: CRNA  Anesthesia Plan Comments: (Adductor canal block for pain control. Spinal + phenylephrine gtt. GA/LMA as backup. Norton Blizzard, MD  )       Anesthesia Quick Evaluation

## 2021-05-29 NOTE — Progress Notes (Signed)
Spoke to patient and updated him on arrival time, NPO status for procedure scheduled for 05-30-21.  Patient had Covid test on 05-26-21.  Patient stated understanding.

## 2021-05-30 ENCOUNTER — Ambulatory Visit (HOSPITAL_COMMUNITY): Payer: Medicare Other | Admitting: Certified Registered Nurse Anesthetist

## 2021-05-30 ENCOUNTER — Encounter (HOSPITAL_COMMUNITY)
Admission: RE | Disposition: A | Discharge status: Home / Self Care | Payer: Self-pay | Source: Home / Self Care | Attending: Orthopedic Surgery

## 2021-05-30 ENCOUNTER — Ambulatory Visit (HOSPITAL_COMMUNITY): Payer: Medicare Other | Admitting: Physician Assistant

## 2021-05-30 ENCOUNTER — Other Ambulatory Visit: Payer: Self-pay

## 2021-05-30 ENCOUNTER — Ambulatory Visit (HOSPITAL_COMMUNITY)
Admission: RE | Admit: 2021-05-30 | Discharge: 2021-05-31 | Disposition: A | Discharge status: Home / Self Care | Payer: Medicare Other | Attending: Orthopedic Surgery | Admitting: Orthopedic Surgery

## 2021-05-30 ENCOUNTER — Encounter (HOSPITAL_COMMUNITY): Payer: Self-pay | Admitting: Orthopedic Surgery

## 2021-05-30 DIAGNOSIS — F17210 Nicotine dependence, cigarettes, uncomplicated: Secondary | ICD-10-CM | POA: Diagnosis not present

## 2021-05-30 DIAGNOSIS — Z79899 Other long term (current) drug therapy: Secondary | ICD-10-CM | POA: Diagnosis not present

## 2021-05-30 DIAGNOSIS — Z96652 Presence of left artificial knee joint: Secondary | ICD-10-CM | POA: Insufficient documentation

## 2021-05-30 DIAGNOSIS — M1711 Unilateral primary osteoarthritis, right knee: Secondary | ICD-10-CM | POA: Insufficient documentation

## 2021-05-30 DIAGNOSIS — E119 Type 2 diabetes mellitus without complications: Secondary | ICD-10-CM | POA: Diagnosis not present

## 2021-05-30 HISTORY — PX: TOTAL KNEE ARTHROPLASTY: SHX125

## 2021-05-30 LAB — GLUCOSE, CAPILLARY
Glucose-Capillary: 99 mg/dL (ref 70–99)
Glucose-Capillary: 100 mg/dL — ABNORMAL HIGH (ref 70–99)
Glucose-Capillary: 174 mg/dL — ABNORMAL HIGH (ref 70–99)
Glucose-Capillary: 192 mg/dL — ABNORMAL HIGH (ref 70–99)

## 2021-05-30 SURGERY — ARTHROPLASTY, KNEE, TOTAL
Anesthesia: Monitor Anesthesia Care | Site: Knee | Laterality: Right

## 2021-05-30 MED ORDER — METOCLOPRAMIDE HCL 5 MG PO TABS: 5.0000 mg | ORAL_TABLET | Freq: Three times a day (TID) | ORAL | Status: DC | PRN

## 2021-05-30 MED ORDER — CEFAZOLIN SODIUM-DEXTROSE 2-4 GM/100ML-% IV SOLN
2.0000 g | INTRAVENOUS | Status: AC
  Administered 2021-05-30: 2 g via INTRAVENOUS
  Filled 2021-05-30: qty 100

## 2021-05-30 MED ORDER — TRANEXAMIC ACID-NACL 1000-0.7 MG/100ML-% IV SOLN
1000.0000 mg | INTRAVENOUS | Status: AC
  Administered 2021-05-30: 1000 mg via INTRAVENOUS
  Filled 2021-05-30: qty 100

## 2021-05-30 MED ORDER — AMISULPRIDE (ANTIEMETIC) 5 MG/2ML IV SOLN: 10.0000 mg | Freq: Once | INTRAVENOUS | Status: DC | PRN

## 2021-05-30 MED ORDER — FENTANYL CITRATE (PF) 100 MCG/2ML IJ SOLN
INTRAMUSCULAR | Status: DC | PRN
  Administered 2021-05-30: 25 ug via INTRAVENOUS

## 2021-05-30 MED ORDER — ISOSORBIDE MONONITRATE ER 60 MG PO TB24
120.0000 mg | ORAL_TABLET | Freq: Every day | ORAL | Status: DC
  Administered 2021-05-30: 120 mg via ORAL
  Filled 2021-05-30: qty 2

## 2021-05-30 MED ORDER — VASOPRESSIN 20 UNIT/ML IV SOLN
INTRAVENOUS | Status: AC
  Filled 2021-05-30: qty 1

## 2021-05-30 MED ORDER — HYDRALAZINE HCL 20 MG/ML IJ SOLN
INTRAMUSCULAR | Status: AC
  Administered 2021-05-30: 10 mg via INTRAVENOUS
  Filled 2021-05-30: qty 1

## 2021-05-30 MED ORDER — ONDANSETRON HCL 4 MG/2ML IJ SOLN: 4.0000 mg | Freq: Four times a day (QID) | INTRAMUSCULAR | Status: DC | PRN

## 2021-05-30 MED ORDER — ALUM & MAG HYDROXIDE-SIMETH 200-200-20 MG/5ML PO SUSP: 30.0000 mL | ORAL | Status: DC | PRN

## 2021-05-30 MED ORDER — ACETAMINOPHEN 500 MG PO TABS
1000.0000 mg | ORAL_TABLET | Freq: Once | ORAL | Status: AC
  Administered 2021-05-30: 1000 mg via ORAL
  Filled 2021-05-30: qty 2

## 2021-05-30 MED ORDER — SODIUM CHLORIDE 0.9% FLUSH
INTRAVENOUS | Status: DC | PRN
  Administered 2021-05-30: 30 mL

## 2021-05-30 MED ORDER — HYDRALAZINE HCL 50 MG PO TABS
50.0000 mg | ORAL_TABLET | Freq: Three times a day (TID) | ORAL | Status: DC
  Administered 2021-05-30 – 2021-05-31 (×4): 50 mg via ORAL
  Filled 2021-05-30 (×4): qty 1

## 2021-05-30 MED ORDER — PHENOL 1.4 % MT LIQD: 1.0000 | OROMUCOSAL | Status: DC | PRN

## 2021-05-30 MED ORDER — ONDANSETRON HCL 4 MG/2ML IJ SOLN
INTRAMUSCULAR | Status: AC
  Filled 2021-05-30: qty 2

## 2021-05-30 MED ORDER — LACTATED RINGERS IV SOLN: INTRAVENOUS | Status: DC

## 2021-05-30 MED ORDER — METOPROLOL TARTRATE 25 MG PO TABS
25.0000 mg | ORAL_TABLET | Freq: Two times a day (BID) | ORAL | Status: DC
  Administered 2021-05-30 – 2021-05-31 (×2): 25 mg via ORAL
  Filled 2021-05-30 (×2): qty 1

## 2021-05-30 MED ORDER — FENTANYL CITRATE (PF) 100 MCG/2ML IJ SOLN: 25.0000 ug | INTRAMUSCULAR | Status: DC | PRN

## 2021-05-30 MED ORDER — PROPOFOL 1000 MG/100ML IV EMUL
INTRAVENOUS | Status: AC
  Filled 2021-05-30: qty 100

## 2021-05-30 MED ORDER — BUPIVACAINE HCL (PF) 0.5 % IJ SOLN
INTRAMUSCULAR | Status: DC | PRN
  Administered 2021-05-30: 2.5 mL via INTRATHECAL

## 2021-05-30 MED ORDER — BUPIVACAINE-EPINEPHRINE 0.25% -1:200000 IJ SOLN
INTRAMUSCULAR | Status: DC | PRN
  Administered 2021-05-30: 30 mL

## 2021-05-30 MED ORDER — STERILE WATER FOR IRRIGATION IR SOLN
Status: DC | PRN
  Administered 2021-05-30 (×2): 1000 mL

## 2021-05-30 MED ORDER — DOCUSATE SODIUM 100 MG PO CAPS
100.0000 mg | ORAL_CAPSULE | Freq: Two times a day (BID) | ORAL | Status: DC
  Administered 2021-05-30 – 2021-05-31 (×2): 100 mg via ORAL
  Filled 2021-05-30 (×2): qty 1

## 2021-05-30 MED ORDER — 0.9 % SODIUM CHLORIDE (POUR BTL) OPTIME
TOPICAL | Status: DC | PRN
  Administered 2021-05-30: 1000 mL

## 2021-05-30 MED ORDER — MENTHOL 3 MG MT LOZG: 1.0000 | LOZENGE | OROMUCOSAL | Status: DC | PRN

## 2021-05-30 MED ORDER — DEXAMETHASONE SODIUM PHOSPHATE 10 MG/ML IJ SOLN
INTRAMUSCULAR | Status: DC | PRN
  Administered 2021-05-30: 8 mg via INTRAVENOUS

## 2021-05-30 MED ORDER — FENTANYL CITRATE (PF) 100 MCG/2ML IJ SOLN
INTRAMUSCULAR | Status: AC
  Filled 2021-05-30: qty 2

## 2021-05-30 MED ORDER — PROPOFOL 10 MG/ML IV BOLUS
INTRAVENOUS | Status: DC | PRN
  Administered 2021-05-30 (×4): 10 mg via INTRAVENOUS

## 2021-05-30 MED ORDER — OXYCODONE HCL 5 MG PO TABS
5.0000 mg | ORAL_TABLET | ORAL | Status: DC | PRN
  Administered 2021-05-30: 5 mg via ORAL
  Filled 2021-05-30 (×2): qty 2

## 2021-05-30 MED ORDER — ASPIRIN 81 MG PO CHEW
81.0000 mg | CHEWABLE_TABLET | Freq: Two times a day (BID) | ORAL | Status: DC
  Administered 2021-05-30 – 2021-05-31 (×2): 81 mg via ORAL
  Filled 2021-05-30 (×2): qty 1

## 2021-05-30 MED ORDER — ACETAMINOPHEN 500 MG PO TABS: 1000.0000 mg | ORAL_TABLET | Freq: Once | ORAL | Status: DC

## 2021-05-30 MED ORDER — HYDRALAZINE HCL 20 MG/ML IJ SOLN
10.0000 mg | INTRAMUSCULAR | Status: DC | PRN
  Administered 2021-05-30: 10 mg via INTRAVENOUS

## 2021-05-30 MED ORDER — SODIUM CHLORIDE 0.9 % IV SOLN: INTRAVENOUS | Status: DC | PRN

## 2021-05-30 MED ORDER — ONDANSETRON HCL 4 MG PO TABS: 4.0000 mg | ORAL_TABLET | Freq: Four times a day (QID) | ORAL | Status: DC | PRN

## 2021-05-30 MED ORDER — INSULIN ASPART 100 UNIT/ML IJ SOLN
0.0000 [IU] | Freq: Every day | INTRAMUSCULAR | Status: DC
Start: 2021-05-30 — End: 2021-05-31

## 2021-05-30 MED ORDER — PHENYLEPHRINE HCL-NACL 20-0.9 MG/250ML-% IV SOLN
INTRAVENOUS | Status: AC
  Filled 2021-05-30: qty 500

## 2021-05-30 MED ORDER — SODIUM CHLORIDE 0.9 % IR SOLN
Status: DC | PRN
  Administered 2021-05-30: 1000 mL

## 2021-05-30 MED ORDER — TRANEXAMIC ACID-NACL 1000-0.7 MG/100ML-% IV SOLN
1000.0000 mg | Freq: Once | INTRAVENOUS | Status: AC
  Administered 2021-05-30: 1000 mg via INTRAVENOUS
  Filled 2021-05-30: qty 100

## 2021-05-30 MED ORDER — OXYCODONE HCL 5 MG/5ML PO SOLN: 5.0000 mg | Freq: Once | ORAL | Status: DC | PRN

## 2021-05-30 MED ORDER — ONDANSETRON HCL 4 MG/2ML IJ SOLN
INTRAMUSCULAR | Status: DC | PRN
  Administered 2021-05-30: 4 mg via INTRAVENOUS

## 2021-05-30 MED ORDER — POVIDONE-IODINE 10 % EX SWAB
2.0000 "application " | Freq: Once | CUTANEOUS | Status: AC
  Administered 2021-05-30: 2 via TOPICAL

## 2021-05-30 MED ORDER — OXYCODONE HCL 5 MG PO TABS
10.0000 mg | ORAL_TABLET | ORAL | Status: DC | PRN
Start: 2021-05-30 — End: 2021-05-31
  Administered 2021-05-30: 15 mg via ORAL
  Administered 2021-05-30: 10 mg via ORAL
  Administered 2021-05-31 (×2): 15 mg via ORAL
  Filled 2021-05-30 (×3): qty 3

## 2021-05-30 MED ORDER — DIPHENHYDRAMINE HCL 12.5 MG/5ML PO ELIX: 12.5000 mg | ORAL_SOLUTION | ORAL | Status: DC | PRN

## 2021-05-30 MED ORDER — BUPIVACAINE-EPINEPHRINE (PF) 0.25% -1:200000 IJ SOLN
INTRAMUSCULAR | Status: AC
  Filled 2021-05-30: qty 60

## 2021-05-30 MED ORDER — CLONIDINE HCL 0.1 MG PO TABS
0.2000 mg | ORAL_TABLET | Freq: Every day | ORAL | Status: DC
  Administered 2021-05-30: 0.2 mg via ORAL
  Filled 2021-05-30: qty 2

## 2021-05-30 MED ORDER — PROMETHAZINE HCL 25 MG/ML IJ SOLN: 6.2500 mg | INTRAMUSCULAR | Status: DC | PRN

## 2021-05-30 MED ORDER — INSULIN ASPART 100 UNIT/ML IJ SOLN
0.0000 [IU] | Freq: Three times a day (TID) | INTRAMUSCULAR | Status: DC
  Administered 2021-05-30: 3 [IU] via SUBCUTANEOUS
  Administered 2021-05-31 (×2): 2 [IU] via SUBCUTANEOUS

## 2021-05-30 MED ORDER — DEXAMETHASONE SODIUM PHOSPHATE 10 MG/ML IJ SOLN
INTRAMUSCULAR | Status: AC
  Filled 2021-05-30: qty 1

## 2021-05-30 MED ORDER — BUPIVACAINE LIPOSOME 1.3 % IJ SUSP
20.0000 mL | Freq: Once | INTRAMUSCULAR | Status: AC
  Administered 2021-05-30: 20 mL
  Filled 2021-05-30: qty 20

## 2021-05-30 MED ORDER — HYDROMORPHONE HCL 1 MG/ML IJ SOLN
0.5000 mg | INTRAMUSCULAR | Status: DC | PRN
  Filled 2021-05-30: qty 1

## 2021-05-30 MED ORDER — ACETAMINOPHEN 325 MG PO TABS: 325.0000 mg | ORAL_TABLET | Freq: Four times a day (QID) | ORAL | Status: DC | PRN

## 2021-05-30 MED ORDER — CEFAZOLIN SODIUM-DEXTROSE 2-4 GM/100ML-% IV SOLN
2.0000 g | Freq: Four times a day (QID) | INTRAVENOUS | Status: AC
  Administered 2021-05-30 (×2): 2 g via INTRAVENOUS
  Filled 2021-05-30 (×2): qty 100

## 2021-05-30 MED ORDER — POVIDONE-IODINE 10 % EX SWAB: 2.0000 "application " | Freq: Once | CUTANEOUS | Status: DC

## 2021-05-30 MED ORDER — OXYCODONE HCL 5 MG PO TABS: 5.0000 mg | ORAL_TABLET | Freq: Once | ORAL | Status: DC | PRN

## 2021-05-30 MED ORDER — MIDAZOLAM HCL 5 MG/5ML IJ SOLN
INTRAMUSCULAR | Status: DC | PRN
  Administered 2021-05-30: .25 mg via INTRAVENOUS

## 2021-05-30 MED ORDER — METOCLOPRAMIDE HCL 5 MG/ML IJ SOLN: 5.0000 mg | Freq: Three times a day (TID) | INTRAMUSCULAR | Status: DC | PRN

## 2021-05-30 MED ORDER — ALBUTEROL SULFATE (2.5 MG/3ML) 0.083% IN NEBU: 2.5000 mg | INHALATION_SOLUTION | RESPIRATORY_TRACT | Status: DC | PRN

## 2021-05-30 MED ORDER — ORAL CARE MOUTH RINSE: 15.0000 mL | Freq: Once | OROMUCOSAL | Status: AC

## 2021-05-30 MED ORDER — PROPOFOL 500 MG/50ML IV EMUL
INTRAVENOUS | Status: DC | PRN
  Administered 2021-05-30: 35 ug/kg/min via INTRAVENOUS

## 2021-05-30 MED ORDER — POVIDONE-IODINE 7.5 % EX SOLN: Freq: Once | CUTANEOUS | Status: DC

## 2021-05-30 MED ORDER — CHLORHEXIDINE GLUCONATE 0.12 % MT SOLN
15.0000 mL | Freq: Once | OROMUCOSAL | Status: AC
  Administered 2021-05-30: 15 mL via OROMUCOSAL

## 2021-05-30 MED ORDER — ACETAMINOPHEN 500 MG PO TABS
1000.0000 mg | ORAL_TABLET | Freq: Four times a day (QID) | ORAL | Status: AC
  Administered 2021-05-30 – 2021-05-31 (×4): 1000 mg via ORAL
  Filled 2021-05-30 (×4): qty 2

## 2021-05-30 MED ORDER — POLYETHYLENE GLYCOL 3350 17 G PO PACK
17.0000 g | PACK | Freq: Two times a day (BID) | ORAL | Status: DC
  Administered 2021-05-30 – 2021-05-31 (×2): 17 g via ORAL
  Filled 2021-05-30 (×2): qty 1

## 2021-05-30 MED ORDER — MIDAZOLAM HCL 2 MG/2ML IJ SOLN
INTRAMUSCULAR | Status: AC
  Filled 2021-05-30: qty 2

## 2021-05-30 MED ORDER — POTASSIUM CHLORIDE IN NACL 20-0.9 MEQ/L-% IV SOLN
INTRAVENOUS | Status: DC
  Filled 2021-05-30 (×3): qty 1000

## 2021-05-30 MED ORDER — PHENYLEPHRINE HCL-NACL 20-0.9 MG/250ML-% IV SOLN
INTRAVENOUS | Status: DC | PRN
  Administered 2021-05-30: 25 ug/min via INTRAVENOUS

## 2021-05-30 SURGICAL SUPPLY — 57 items
BAG COUNTER SPONGE SURGICOUNT (BAG) IMPLANT
BAG SPNG CNTER NS LX DISP (BAG)
BLADE HEX COATED 2.75 (ELECTRODE) ×2 IMPLANT
BLADE SAG 18X100X1.27 (BLADE) ×2 IMPLANT
BLADE SAGITTAL 25.0X1.37X90 (BLADE) ×2 IMPLANT
BLADE SURG 15 STRL LF DISP TIS (BLADE) ×1 IMPLANT
BLADE SURG 15 STRL SS (BLADE) ×2
BLADE SURG SZ10 CARB STEEL (BLADE) ×4 IMPLANT
BNDG CMPR MED 10X6 ELC LF (GAUZE/BANDAGES/DRESSINGS) ×1
BNDG ELASTIC 6X10 VLCR STRL LF (GAUZE/BANDAGES/DRESSINGS) ×2 IMPLANT
BOWL SMART MIX CTS (DISPOSABLE) IMPLANT
BSPLAT TIB 7 KN TRITANIUM (Knees) ×1 IMPLANT
CLSR STERI-STRIP ANTIMIC 1/2X4 (GAUZE/BANDAGES/DRESSINGS) ×2 IMPLANT
COVER SURGICAL LIGHT HANDLE (MISCELLANEOUS) ×2 IMPLANT
CUFF TOURN SGL QUICK 24 (TOURNIQUET CUFF) ×2
CUFF TOURN SGL QUICK 34 (TOURNIQUET CUFF) ×2
CUFF TRNQT CYL 24X4X16.5-23 (TOURNIQUET CUFF) ×1 IMPLANT
CUFF TRNQT CYL 34X4.125X (TOURNIQUET CUFF) ×1 IMPLANT
DECANTER SPIKE VIAL GLASS SM (MISCELLANEOUS) ×2 IMPLANT
DRAPE U-SHAPE 47X51 STRL (DRAPES) ×2 IMPLANT
DRSG AQUACEL AG ADV 3.5X10 (GAUZE/BANDAGES/DRESSINGS) ×2 IMPLANT
DRSG MEPILEX BORDER 4X12 (GAUZE/BANDAGES/DRESSINGS) ×2 IMPLANT
DRSG PAD ABDOMINAL 8X10 ST (GAUZE/BANDAGES/DRESSINGS) ×4 IMPLANT
DURAPREP 26ML APPLICATOR (WOUND CARE) ×4 IMPLANT
FEMORAL POSTERIOR SZ6 RT (Femur) ×1 IMPLANT
GLOVE SRG 8 PF TXTR STRL LF DI (GLOVE) ×1 IMPLANT
GLOVE SURG ENC MOIS LTX SZ7.5 (GLOVE) ×2 IMPLANT
GLOVE SURG POLYISO LF SZ7.5 (GLOVE) ×2 IMPLANT
GLOVE SURG UNDER POLY LF SZ7.5 (GLOVE) ×2 IMPLANT
GLOVE SURG UNDER POLY LF SZ8 (GLOVE) ×2
GOWN STRL REUS W/TWL LRG LVL3 (GOWN DISPOSABLE) ×2 IMPLANT
GOWN STRL REUS W/TWL XL LVL3 (GOWN DISPOSABLE) ×2 IMPLANT
HANDPIECE INTERPULSE COAX TIP (DISPOSABLE) ×2
HOLDER FOLEY CATH W/STRAP (MISCELLANEOUS) IMPLANT
IMMOBILIZER KNEE 22 UNIV (SOFTGOODS) IMPLANT
INSERT TIB BEAR PS X3 7-11 E (Insert) ×2 IMPLANT
KIT TURNOVER KIT A (KITS) ×2 IMPLANT
KNEE PATELLA ASYMMETRIC 10X35 (Knees) ×2 IMPLANT
KNEE TIBIAL COMPONENT SZ7 (Knees) ×2 IMPLANT
MANIFOLD NEPTUNE II (INSTRUMENTS) ×2 IMPLANT
NS IRRIG 1000ML POUR BTL (IV SOLUTION) ×2 IMPLANT
PACK ICE MAXI GEL EZY WRAP (MISCELLANEOUS) ×2 IMPLANT
PACK TOTAL KNEE CUSTOM (KITS) ×2 IMPLANT
PENCIL SMOKE EVACUATOR (MISCELLANEOUS) IMPLANT
PIN FLUTED HEDLESS FIX 3.5X1/8 (PIN) ×2 IMPLANT
POSTERIOR FEMORAL SZ6 RT (Femur) ×2 IMPLANT
PROTECTOR NERVE ULNAR (MISCELLANEOUS) ×2 IMPLANT
SET HNDPC FAN SPRY TIP SCT (DISPOSABLE) ×1 IMPLANT
SURGILUBE 2OZ TUBE FLIPTOP (MISCELLANEOUS) ×2 IMPLANT
SUT MNCRL AB 3-0 PS2 18 (SUTURE) ×2 IMPLANT
SUT VIC AB 0 CT1 36 (SUTURE) ×2 IMPLANT
SUT VIC AB 1 CT1 36 (SUTURE) ×4 IMPLANT
SUT VIC AB 2-0 CT1 27 (SUTURE) ×2
SUT VIC AB 2-0 CT1 TAPERPNT 27 (SUTURE) ×1 IMPLANT
TRAY FOLEY MTR SLVR 14FR STAT (SET/KITS/TRAYS/PACK) IMPLANT
TRAY FOLEY MTR SLVR 16FR STAT (SET/KITS/TRAYS/PACK) ×2 IMPLANT
TUBE SUCTION HIGH CAP CLEAR NV (SUCTIONS) ×2 IMPLANT

## 2021-05-30 NOTE — Transfer of Care (Signed)
Immediate Anesthesia Transfer of Care Note  Patient: Phillip Kennedy  Procedure(s) Performed: RIGHT TOTAL KNEE ARTHROPLASTY (Right: Knee)  Patient Location: PACU  Anesthesia Type:Spinal and MAC combined with regional for post-op pain  Level of Consciousness: drowsy and patient cooperative  Airway & Oxygen Therapy: Patient Spontanous Breathing and Patient connected to nasal cannula oxygen  Post-op Assessment: Report given to RN and Post -op Vital signs reviewed and stable  Post vital signs: Reviewed and stable  Last Vitals:  Vitals Value Taken Time  BP 143/94 05/30/21 0924  Temp    Pulse 35 05/30/21 0924  Resp 15 05/30/21 0928  SpO2 100 % 05/30/21 0924  Vitals shown include unvalidated device data.  Last Pain:  Vitals:   05/30/21 0628  TempSrc: Oral         Complications: No notable events documented.

## 2021-05-30 NOTE — Evaluation (Signed)
Physical Therapy Evaluation Patient Details Name: Phillip Kennedy MRN: [id number] DOB: 1956/10/14 Today's Date: 05/30/2021   History of Present Illness  65 yo male s/p R TKA 05/30/21. Hx of L TKA 2019, CHF, DM, COPD  Clinical Impression  On eval POD 0, pt required Min A for mobility. He walked ~50 feet with a RW. Pain (primarily posterior R knee/thigh) rated 9/10-pt was premedicated. Assisted pt to back to bed after walk. Encouraged him to keep RN aware of pain level. Will follow and progress activity as tolerated. Plan is for possible d/c home on tomorrow if meeting PT goals and pain controlled.     Follow Up Recommendations Follow surgeon's recommendation for DC plan and follow-up therapies    Equipment Recommendations  Rolling walker with 5" wheels    Recommendations for Other Services       Precautions / Restrictions Precautions Precautions: Fall;Knee Required Braces or Orthoses: Knee Immobilizer - Right (used for comfort primarily. However, pt also could not perform SLR) Restrictions Weight Bearing Restrictions: No RLE Weight Bearing: Weight bearing as tolerated Other Position/Activity Restrictions: WBAT      Mobility  Bed Mobility Overal bed mobility: Needs Assistance Bed Mobility: Supine to Sit;Sit to Supine     Supine to sit: Min assist;HOB elevated Sit to supine: Min assist;HOB elevated   General bed mobility comments: Assist for R LE. Pt also attempted to hook R LE with L foot. Sat EOB for several minutes. Mild lightheadedness that resolved with time. BP 144/96 prior to bed mobility.    Transfers Overall transfer level: Needs assistance Equipment used: Rolling walker (2 wheeled) Transfers: Sit to/from Stand Sit to Stand: Min assist;From elevated surface         General transfer comment: Cues for safety, technique, hand/LE placement. Assist to rise, steady, control descent.  Ambulation/Gait Ambulation/Gait assistance: Min assist Gait Distance (Feet): 50  Feet Assistive device: Rolling walker (2 wheeled) Gait Pattern/deviations: Step-to pattern;Trunk flexed;Antalgic     General Gait Details: Cues for safety, posture, RW proximity, step lengths, proper use of RW. Assist to steady throughout distance.  Stairs            Wheelchair Mobility    Modified Rankin (Stroke Patients Only)       Balance Overall balance assessment: Needs assistance         Standing balance support: Bilateral upper extremity supported Standing balance-Leahy Scale: Poor                               Pertinent Vitals/Pain Pain Assessment: 0-10 Pain Score: 9 Pain Location: R knee Pain Descriptors / Indicators: Discomfort;Sore Pain Intervention(s): Limited activity within patient's tolerance;Monitored during session;Ice applied;Repositioned Minimally filled ice pack also placed behind knee/thigh area at pt's request    Home Living Family/patient expects to be discharged to:: Private residence Living Arrangements: Spouse/significant other Available Help at Discharge: Family Type of Home: House Home Access: Stairs to enter Entrance Stairs-Rails: None Secretary/administrator of Steps: 1+1 Home Layout: One level Home Equipment: Cane - single point      Prior Function Level of Independence: Independent with assistive device(s)         Comments: using cane     Hand Dominance        Extremity/Trunk Assessment   Upper Extremity Assessment Upper Extremity Assessment: Overall WFL for tasks assessed    Lower Extremity Assessment Lower Extremity Assessment: Generalized weakness;RLE deficits/detail RLE Deficits / Details: lower  leg/foot swelling-pt reports this is not new    Cervical / Trunk Assessment Cervical / Trunk Assessment: Normal  Communication   Communication: No difficulties  Cognition Arousal/Alertness: Awake/alert Behavior During Therapy: WFL for tasks assessed/performed Overall Cognitive Status: Within  Functional Limits for tasks assessed                                        General Comments      Exercises     Assessment/Plan    PT Assessment Patient needs continued PT services  PT Problem List Decreased strength;Decreased mobility;Decreased range of motion;Decreased activity tolerance;Decreased balance;Decreased knowledge of use of DME;Pain       PT Treatment Interventions DME instruction;Gait training;Therapeutic exercise;Balance training;Stair training;Functional mobility training;Therapeutic activities;Patient/family education    PT Goals (Current goals can be found in the Care Plan section)  Acute Rehab PT Goals Patient Stated Goal: less pain PT Goal Formulation: With patient/family Time For Goal Achievement: 06/13/21 Potential to Achieve Goals: Good    Frequency 7X/week   Barriers to discharge        Co-evaluation               AM-PAC PT "6 Clicks" Mobility  Outcome Measure Help needed turning from your back to your side while in a flat bed without using bedrails?: A Little Help needed moving from lying on your back to sitting on the side of a flat bed without using bedrails?: A Little Help needed moving to and from a bed to a chair (including a wheelchair)?: A Little Help needed standing up from a chair using your arms (e.g., wheelchair or bedside chair)?: A Little Help needed to walk in hospital room?: A Little Help needed climbing 3-5 steps with a railing? : A Little 6 Click Score: 18    End of Session Equipment Utilized During Treatment: Gait belt;Right knee immobilizer Activity Tolerance: Patient limited by pain Patient left: in bed;with call bell/phone within reach;with bed alarm set;with family/visitor present   PT Visit Diagnosis: Pain;Other abnormalities of gait and mobility (R26.89) Pain - Right/Left: Right Pain - part of body: Knee (R posterior thigh)    Time: 1275-1700 PT Time Calculation (min) (ACUTE ONLY): 23  min   Charges:   PT Evaluation $PT Eval Low Complexity: 1 Low PT Treatments $Gait Training: 8-22 mins           Faye Ramsay, PT Acute Rehabilitation  Office: [phone] Pager: [phone]

## 2021-05-30 NOTE — Progress Notes (Signed)
Orthopedic Tech Progress Note Patient Details:  Phillip Kennedy [facility] 05-Mar-1956 [id number]  Patient ID: Phillip Kennedy, male   DOB: 02-May-1956, 65 y.o.   MRN: [id number]  Phillip Kennedy 05/30/2021, 10:45 AM Cpm applied in pacu. Bone foam placed on bed.

## 2021-05-30 NOTE — Interval H&P Note (Signed)
History and Physical Interval Note:  05/30/2021 7:18 AM  Phillip Kennedy  has presented today for surgery, with the diagnosis of OSTEOARTHRITIS RIGHT KNEE.  The various methods of treatment have been discussed with the patient and family. After consideration of risks, benefits and other options for treatment, the patient has consented to  Procedure(s): TOTAL KNEE ARTHROPLASTY (Right) as a surgical intervention.  The patient's history has been reviewed, patient examined, no change in status, stable for surgery.  I have reviewed the patient's chart and labs.  Questions were answered to the patient's satisfaction.     Sheral Apley

## 2021-05-30 NOTE — Anesthesia Procedure Notes (Signed)
Procedure Name: MAC Date/Time: 05/30/2021 7:22 AM Performed by: West Pugh, CRNA Pre-anesthesia Checklist: Patient identified, Emergency Drugs available, Suction available, Patient being monitored and Timeout performed Patient Re-evaluated:Patient Re-evaluated prior to induction Oxygen Delivery Method: Simple face mask Preoxygenation: Pre-oxygenation with 100% oxygen Placement Confirmation: positive ETCO2 Dental Injury: Teeth and Oropharynx as per pre-operative assessment

## 2021-05-30 NOTE — Op Note (Signed)
DATE OF SURGERY:  05/30/2021 TIME: 9:06 AM  [name]   AGE: 65 y.o.    PRE-OPERATIVE DIAGNOSIS:  OSTEOARTHRITIS RIGHT KNEE  POST-OPERATIVE DIAGNOSIS:  Same  PROCEDURE:  Procedure(s): RIGHT TOTAL KNEE ARTHROPLASTY   SURGEON:  Sheral Apley, MD   ASSISTANT:  Levester Fresh, PA-C, he was present and scrubbed throughout the case, critical for completion in a timely fashion, and for retraction, instrumentation, and closure.    OPERATIVE IMPLANTS: Stryker Triathlon PS. Press fit knee  Femur size 6, Tibia size 7, Patella size 35 3-peg oval button, with a 11 mm polyethylene insert.   PREOPERATIVE INDICATIONS:  Phillip Kennedy is a 65 y.o. year old male with end stage bone on bone degenerative arthritis of the knee who failed conservative treatment, including injections, antiinflammatories, activity modification, and assistive devices, and had significant impairment of their activities of daily living, and elected for Total Knee Arthroplasty.   The risks, benefits, and alternatives were discussed at length including but not limited to the risks of infection, bleeding, nerve injury, stiffness, blood clots, the need for revision surgery, cardiopulmonary complications, among others, and they were willing to proceed.   OPERATIVE DESCRIPTION:  The patient was brought to the operative room and placed in a supine position.  General anesthesia was administered.  IV antibiotics were given.  The lower extremity was prepped and draped in the usual sterile fashion.  Time out was performed.  The leg was elevated and exsanguinated and the tourniquet was inflated.  Anterior approach was performed.  The patella was everted and osteophytes were removed.  The anterior horn of the medial and lateral meniscus was removed.   The distal femur was opened with the drill and the intramedullary distal femoral cutting jig was utilized, set at 5 degrees resecting 10 mm off the distal femur.   Care was taken to protect the collateral ligaments.  The distal femoral sizing jig was applied, taking care to avoid notching.  Then the 4-in-1 cutting jig was applied and the anterior and posterior femur was cut, along with the chamfer cuts.  All posterior osteophytes were removed.  The flexion gap was then measured and was symmetric with the extension gap.  Then the extramedullary tibial cutting jig was utilized making the appropriate cut using the anterior tibial crest as a reference building in appropriate posterior slope.  Care was taken during the cut to protect the medial and collateral ligaments.  The proximal tibia was removed along with the posterior horns of the menisci.  The PCL was sacrificed.    The extensor gap was measured and was approximately 67mm.    I completed the distal femoral preparation using the appropriate jig to prepare the box.  The patella was then measured, and cut with the saw.    The proximal tibia sized and prepared accordingly with the reamer and the punch, and then all components were trialed with the above sized poly insert.  The knee was found to have excellent balance and full motion.    The above named components were then impacted into place and Poly tibial piece and patella were inserted.  I was very happy with his stability and ROM  I performed a periarticular injection with marcaine and toradol  The knee was easily taken through a range of motion and the patella tracked well and the knee irrigated copiously and the parapatellar and subcutaneous tissue closed with vicryl, and monocryl with steri strips for the skin.  The incision was dressed with sterile gauze and the tourniquet released and the patient was awakened and returned to the PACU in stable and satisfactory condition.  There were no complications.  Total tourniquet time was roughly 75 minutes.   POSTOPERATIVE PLAN: post op Abx, DVT px: SCD's, TED's, Early ambulation and chemical px

## 2021-05-30 NOTE — Plan of Care (Signed)

## 2021-05-30 NOTE — Progress Notes (Signed)
Orthopedic Tech Progress Note Patient Details:  Phillip Kennedy [facility] 12-11-1955 [id number]  Patient ID: Phillip Kennedy, male   DOB: April 27, 1956, 65 y.o.   MRN: [id number]  Phillip Kennedy 05/30/2021, 2:34 PM Cpm removed. Patient placed in bone foam.

## 2021-05-30 NOTE — Anesthesia Procedure Notes (Addendum)
Spinal  Patient location during procedure: OR Start time: 05/30/2021 7:25 AM End time: 05/30/2021 7:35 AM Reason for block: surgical anesthesia Staffing Performed: anesthesiologist  Anesthesiologist: Mellody Dance, MD Resident/CRNA: Wynonia Sours, CRNA Preanesthetic Checklist Completed: patient identified, IV checked, risks and benefits discussed, surgical consent, monitors and equipment checked, pre-op evaluation and timeout performed Spinal Block Patient position: sitting Prep: DuraPrep Patient monitoring: cardiac monitor, continuous pulse ox and blood pressure Approach: midline Location: L3-4 Injection technique: single-shot Needle Needle type: Pencan  Needle gauge: 24 G Needle length: 9 cm Assessment Events: CSF return and second provider Additional Notes Functioning IV was confirmed and monitors were applied. Sterile prep and drape, including hand hygiene and sterile gloves were used. The patient was positioned and the spine was prepped. The skin was anesthetized with lidocaine.  Free flow of clear CSF was obtained prior to injecting local anesthetic into the CSF.  The spinal needle aspirated freely following injection.  The needle was carefully withdrawn.  The patient tolerated the procedure well.

## 2021-05-30 NOTE — Anesthesia Postprocedure Evaluation (Signed)
Anesthesia Post Note  Patient: Phillip Kennedy  Procedure(s) Performed: RIGHT TOTAL KNEE ARTHROPLASTY (Right: Knee)     Patient location during evaluation: PACU Anesthesia Type: Regional and Spinal Level of consciousness: oriented and awake and alert Pain management: pain level controlled Vital Signs Assessment: post-procedure vital signs reviewed and stable Respiratory status: spontaneous breathing and respiratory function stable Cardiovascular status: blood pressure returned to baseline and stable Postop Assessment: no headache, no backache, no apparent nausea or vomiting, patient able to bend at knees and spinal receding Anesthetic complications: no   No notable events documented.  Last Vitals:  Vitals:   05/30/21 1115 05/30/21 1145  BP: (!) 145/105 (!) 140/94  Pulse: (!) 54 (!) 50  Resp: 10 11  Temp:    SpO2: 100% 100%    Last Pain:  Vitals:   05/30/21 1059  TempSrc:   PainSc: Asleep                 Mellody Dance

## 2021-05-31 DIAGNOSIS — M1711 Unilateral primary osteoarthritis, right knee: Secondary | ICD-10-CM | POA: Diagnosis not present

## 2021-05-31 LAB — CBC
HCT: 31.4 % — ABNORMAL LOW (ref 39.0–52.0)
Hemoglobin: 9.7 g/dL — ABNORMAL LOW (ref 13.0–17.0)
MCH: 28.4 pg (ref 26.0–34.0)
MCHC: 30.9 g/dL (ref 30.0–36.0)
MCV: 92.1 fL (ref 80.0–100.0)
Platelets: 163 10*3/uL (ref 150–400)
RBC: 3.41 MIL/uL — ABNORMAL LOW (ref 4.22–5.81)
RDW: 14 % (ref 11.5–15.5)
WBC: 8.4 10*3/uL (ref 4.0–10.5)
nRBC: 0 % (ref 0.0–0.2)

## 2021-05-31 LAB — GLUCOSE, CAPILLARY
Glucose-Capillary: 142 mg/dL — ABNORMAL HIGH (ref 70–99)
Glucose-Capillary: 104 mg/dL — ABNORMAL HIGH (ref 70–99)
Glucose-Capillary: 141 mg/dL — ABNORMAL HIGH (ref 70–99)

## 2021-05-31 LAB — BASIC METABOLIC PANEL
Anion gap: 5 (ref 5–15)
BUN: 30 mg/dL — ABNORMAL HIGH (ref 8–23)
CO2: 25 mmol/L (ref 22–32)
Calcium: 8.4 mg/dL — ABNORMAL LOW (ref 8.9–10.3)
Chloride: 107 mmol/L (ref 98–111)
Creatinine, Ser: 1.51 mg/dL — ABNORMAL HIGH (ref 0.61–1.24)
GFR, Estimated: 51 mL/min — ABNORMAL LOW (ref >60)
Glucose, Bld: 128 mg/dL — ABNORMAL HIGH (ref 70–99)
Potassium: 4 mmol/L (ref 3.5–5.1)
Sodium: 137 mmol/L (ref 135–145)

## 2021-05-31 MED ORDER — ASPIRIN 81 MG PO CHEW: 81.0000 mg | CHEWABLE_TABLET | Freq: Two times a day (BID) | ORAL | 0 refills | Status: DC

## 2021-05-31 MED ORDER — OXYCODONE HCL 5 MG PO TABS: ORAL_TABLET | ORAL | 0 refills | Status: DC

## 2021-05-31 MED ORDER — DOCUSATE SODIUM 100 MG PO CAPS: ORAL_CAPSULE | ORAL | 0 refills | Status: AC

## 2021-05-31 MED ORDER — POLYETHYLENE GLYCOL 3350 17 G PO PACK: PACK | ORAL | 0 refills | Status: DC

## 2021-05-31 NOTE — Plan of Care (Signed)

## 2021-05-31 NOTE — Progress Notes (Signed)
Physical Therapy Treatment Patient Details Name: Phillip Kennedy MRN: [id number] DOB: [birth year] Today's Date: 05/31/2021    History of Present Illness 65 yo male s/p R TKA 05/30/21. Hx of L TKA 2019, CHF, DM, COPD    PT Comments    3rd session to practice stair negotiation. Pt was able to go up and down 1 step. He is unsteady. Ortho PA has discharged him and wants him to go home today. He is awaiting a standard RW which he needs to safely ambulate (he only has a rollator at home per pt). Pt is a bit disappointed he cannot remain overnight but seems to understand.   Addendum: Pt now states he will borrow a walker instead of paying out of pocket for a new one.    Follow Up Recommendations  Follow surgeon's recommendation for DC plan and follow-up therapies; 24 hour supervision/assist     Equipment Recommendations  Rolling walker with 5" wheels    Recommendations for Other Services       Precautions / Restrictions Precautions Precautions: Fall;Knee Required Braces or Orthoses:  (bledsoe brace) Restrictions Weight Bearing Restrictions: No RLE Weight Bearing: Weight bearing as tolerated    Mobility  Bed Mobility Overal bed mobility: Needs Assistance Bed Mobility: Supine to Sit;Sit to Supine     Supine to sit: Min assist Sit to supine: Min assist   General bed mobility comments: Assist for R LE.    Transfers Overall transfer level: Needs assistance Equipment used: Rolling walker (2 wheeled) Transfers: Sit to/from Stand Sit to Stand: From elevated surface;Min assist         General transfer comment: Assist to rise, steady, control descent. Cues for safety, hand/LE placement.  Ambulation/Gait Ambulation/Gait assistance: Min assist Gait Distance (Feet): 25 Feet Assistive device: Rolling walker (2 wheeled) Gait Pattern/deviations: Step-to pattern;Trunk flexed;Antalgic;Decreased weight shift to right     General Gait Details: Cues for safety, posture, RW proximity,  step lengths, proper use of RW. Assist to steady throughout distance.   Stairs Stairs: Yes Stairs assistance: Min assist Stair Management: Forwards;With walker Number of Stairs: 1 General stair comments: x2. Cues for safety, technique, sequence. Assist to steady/support pt.   Wheelchair Mobility    Modified Rankin (Stroke Patients Only)       Balance Overall balance assessment: Needs assistance Sitting-balance support: Bilateral upper extremity supported Sitting balance-Leahy Scale: Poor     Standing balance support: Bilateral upper extremity supported Standing balance-Leahy Scale: Poor                              Cognition Arousal/Alertness: Awake/alert Behavior During Therapy: WFL for tasks assessed/performed Overall Cognitive Status: Within Functional Limits for tasks assessed                                        Exercises Total Joint Exercises Ankle Circles/Pumps: AROM;Both;10 reps Quad Sets: AROM;Right;10 reps Heel Slides: AAROM;Right;10 reps Hip ABduction/ADduction: AAROM;Right;10 reps Straight Leg Raises: AAROM;Right;10 reps Goniometric ROM: ~10-65 degrees    General Comments        Pertinent Vitals/Pain Pain Assessment: 0-10 Pain Score: 7  Pain Location: R knee/thigh Pain Descriptors / Indicators: Discomfort;Sore Pain Intervention(s): Monitored during session;Repositioned    Home Living                      Prior  Function            PT Goals (current goals can now be found in the care plan section) Progress towards PT goals: Progressing toward goals    Frequency    7X/week      PT Plan Current plan remains appropriate    Co-evaluation              AM-PAC PT "6 Clicks" Mobility   Outcome Measure  Help needed turning from your back to your side while in a flat bed without using bedrails?: A Little Help needed moving from lying on your back to sitting on the side of a flat bed without  using bedrails?: A Little Help needed moving to and from a bed to a chair (including a wheelchair)?: A Little Help needed standing up from a chair using your arms (e.g., wheelchair or bedside chair)?: A Little Help needed to walk in hospital room?: A Little Help needed climbing 3-5 steps with a railing? : A Little 6 Click Score: 18    End of Session Equipment Utilized During Treatment: Gait belt (bledsoe brace) Activity Tolerance: Patient tolerated treatment well Patient left: in bed;with call bell/phone within reach;with bed alarm set   PT Visit Diagnosis: Pain;Other abnormalities of gait and mobility (R26.89) Pain - Right/Left: Right Pain - part of body: Knee     Time: 8003-4917 PT Time Calculation (min) (ACUTE ONLY): 13 min  Charges:  $Gait Training: 8-22 mins $Therapeutic Exercise: 8-22 mins                        Faye Ramsay, PT Acute Rehabilitation  Office: [phone] Pager: [phone]

## 2021-05-31 NOTE — Progress Notes (Signed)
Physical Therapy Treatment Patient Details Name: Phillip Kennedy MRN: [id number] DOB: 28-Feb-1956 Today's Date: 05/31/2021    History of Present Illness 65 yo male s/p R TKA 05/30/21. Hx of L TKA 2019, CHF, DM, COPD    PT Comments    Pt required increased assistance this afternoon compared to this morning. He also reported he did not have appropriate RW at home for use-he has a rollator. Feel pt would benefit from continued therapy in house however ortho PA is not agreeable to pt remaining in hospital overnight. Will plan to have another session to practice stair negotiation. Made RN aware that pt needs a new RW-he is agreeable to paying out of pocket now.     Follow Up Recommendations  Follow surgeon's recommendation for DC plan and follow-up therapies     Equipment Recommendations  Rolling walker with 5" wheels    Recommendations for Other Services       Precautions / Restrictions Precautions Precautions: Fall;Knee Required Braces or Orthoses:  (bledsoe brace-open ROM) Restrictions Weight Bearing Restrictions: No RLE Weight Bearing: Weight bearing as tolerated    Mobility  Bed Mobility Overal bed mobility: Needs Assistance Bed Mobility: Supine to Sit      Sit to supine: Min assist   General bed mobility comments: Assist for R LE.    Transfers Overall transfer level: Needs assistance Equipment used: Rolling walker (2 wheeled) Transfers: Sit to/from Stand Sit to Stand: Min assist         General transfer comment: Small amount of assist provided. Cues for safety, hand/LE placement.  Ambulation/Gait Ambulation/Gait assistance: Min assist Gait Distance (Feet): 50 Feet Assistive device: Rolling walker (2 wheeled) Gait Pattern/deviations: Step-to pattern;Trunk flexed;Antalgic;Decreased weight shift to right     General Gait Details: Cues for safety, posture, RW proximity, step lengths, proper use of RW. Assist to steady throughout distance. Less steady this  afternoon compared to this a.m   Stairs             Wheelchair Mobility    Modified Rankin (Stroke Patients Only)       Balance Overall balance assessment: Needs assistance         Standing balance support: Bilateral upper extremity supported Standing balance-Leahy Scale: Poor                              Cognition Arousal/Alertness: Awake/alert Behavior During Therapy: WFL for tasks assessed/performed Overall Cognitive Status: Within Functional Limits for tasks assessed                                        Exercises Total Joint Exercises Ankle Circles/Pumps: AROM;Both;10 reps Quad Sets: AROM;Right;10 reps Heel Slides: AAROM;Right;10 reps Hip ABduction/ADduction: AAROM;Right;10 reps Straight Leg Raises: AAROM;Right;10 reps Goniometric ROM: ~10-65 degrees    General Comments        Pertinent Vitals/Pain Pain Assessment: 0-10 Pain Score: 8  Pain Location: R knee/thigh Pain Descriptors / Indicators: Discomfort;Sore Pain Intervention(s): Limited activity within patient's tolerance;Monitored during session;Repositioned    Home Living                      Prior Function            PT Goals (current goals can now be found in the care plan section) Progress towards PT goals: Progressing toward goals  Frequency    7X/week      PT Plan Current plan remains appropriate    Co-evaluation              AM-PAC PT "6 Clicks" Mobility   Outcome Measure  Help needed turning from your back to your side while in a flat bed without using bedrails?: A Little Help needed moving from lying on your back to sitting on the side of a flat bed without using bedrails?: A Little Help needed moving to and from a bed to a chair (including a wheelchair)?: A Little Help needed standing up from a chair using your arms (e.g., wheelchair or bedside chair)?: A Little Help needed to walk in hospital room?: A Little Help needed  climbing 3-5 steps with a railing? : A Little 6 Click Score: 18    End of Session Equipment Utilized During Treatment: Gait belt;Right knee immobilizer Activity Tolerance: Patient tolerated treatment well Patient left: in bed;with call bell/phone within reach   PT Visit Diagnosis: Pain;Other abnormalities of gait and mobility (R26.89) Pain - Right/Left: Right Pain - part of body: Knee     Time: 1420-1440 PT Time Calculation (min) (ACUTE ONLY): 20 min  Charges:  $Gait Training: 8-22 mins $Therapeutic Exercise: 8-22 mins                         Faye Ramsay, PT Acute Rehabilitation  Office: [phone] Pager: [phone]

## 2021-05-31 NOTE — Addendum Note (Signed)
Addendum  created 05/31/21 1154 by Mellody Dance, MD   Child order released for a procedure order, Clinical Note Signed, Intraprocedure Blocks edited, SmartForm saved

## 2021-05-31 NOTE — Progress Notes (Signed)
Orthopedic Tech Progress Note Patient Details:  Chaise Mahabir [facility] 02/04/1956 [id number]  Patient ID: Severiano Gilbert, male   DOB: 1956-05-25, 65 y.o.   MRN: [id number]  Kizzie Fantasia 05/31/2021, 9:20 AM Bledsoe brace ordered from [facility] .

## 2021-05-31 NOTE — TOC Transition Note (Signed)
Transition of Care [facility]) - CM/SW Discharge Note  Patient Details  Name: Phillip Kennedy MRN: [id number] Date of Birth: [birth year]  Transition of Care [facility]) CM/SW Contact:  Sherie Don, LCSW Phone Number: 05/31/2021, 9:48 AM  Clinical Narrative: Patient is expected to discharge home after working with PT. Per Ovid Curd with [employer], patient received a rolling walker and 3N1 3 years ago with a previous surgery, but the patient gave away the DME and declined to private pay for new DME. [employer] set up CPM prior to surgery. CSW met with patient to confirm discharge plan. Patient confirmed he will do OPPT, but did not remember where. Patient reported he will replace his DME on his own and declined to have CSW or [employer] set it up. TOC signing off.  Final next level of care: OP Rehab Barriers to Discharge: No Barriers Identified  Patient Goals and CMS Choice Patient states their goals for this hospitalization and ongoing recovery are:: Discharge home with [facility] CMS Medicare.gov Compare Post Acute Care list provided to:: Patient Choice offered to / list presented to : NA  Discharge Plan and Services         DME Arranged: N/A DME Agency: NA  Readmission Risk Interventions No flowsheet data found.

## 2021-05-31 NOTE — Progress Notes (Signed)
Provided discharge education/instructions to Pt and his wife, all questions and concerns addressed, Pt not in acute distress. Pt states they have found a walker at home. Pt discharged with belongings accompanied by wife.

## 2021-05-31 NOTE — Progress Notes (Signed)
Physical Therapy Treatment Patient Details Name: Phillip Kennedy MRN: [id number] DOB: 06-May-1956 Today's Date: 05/31/2021    History of Present Illness 65 yo male s/p R TKA 05/30/21. Hx of L TKA 2019, CHF, DM, COPD    PT Comments    Progressing with mobility. Moderate pain with activity. Will plan to have a 2nd session prior to possible d/c home later today.    Follow Up Recommendations  Follow surgeon's recommendation for DC plan and follow-up therapies     Equipment Recommendations  Rolling walker with 5" wheels    Recommendations for Other Services       Precautions / Restrictions Precautions Precautions: Fall;Knee Required Braces or Orthoses: Knee Immobilizer - Right (has order in chart for Bledsoe brace) Restrictions Weight Bearing Restrictions: No RLE Weight Bearing: Weight bearing as tolerated    Mobility  Bed Mobility Overal bed mobility: Needs Assistance Bed Mobility: Supine to Sit     Supine to sit: Min assist     General bed mobility comments: small amount of assist for R LE. Cues provided.    Transfers Overall transfer level: Needs assistance Equipment used: Rolling walker (2 wheeled) Transfers: Sit to/from Stand Sit to Stand: Min assist;From elevated surface         General transfer comment: Small amount of assist provided. Cues for safety, hand/LE placement.  Ambulation/Gait Ambulation/Gait assistance: Min assist Gait Distance (Feet): 75 Feet Assistive device: Rolling walker (2 wheeled) Gait Pattern/deviations: Step-to pattern;Trunk flexed;Antalgic;Decreased weight shift to right     General Gait Details: Cues for safety, posture, RW proximity, step lengths, proper use of RW. Assist to steady throughout distance.   Stairs             Wheelchair Mobility    Modified Rankin (Stroke Patients Only)       Balance Overall balance assessment: Needs assistance         Standing balance support: Bilateral upper extremity  supported Standing balance-Leahy Scale: Poor                              Cognition Arousal/Alertness: Awake/alert Behavior During Therapy: WFL for tasks assessed/performed Overall Cognitive Status: Within Functional Limits for tasks assessed                                        Exercises Total Joint Exercises Ankle Circles/Pumps: AROM;Both;10 reps Quad Sets: AROM;Right;10 reps Heel Slides: AAROM;Right;10 reps Hip ABduction/ADduction: AAROM;Right;10 reps Straight Leg Raises: AAROM;Right;10 reps Goniometric ROM: ~10-65 degrees    General Comments        Pertinent Vitals/Pain Pain Assessment: 0-10 Pain Score: 8  Pain Location: R knee/thigh Pain Descriptors / Indicators: Discomfort;Sore Pain Intervention(s): Monitored during session;Ice applied;Repositioned    Home Living                      Prior Function            PT Goals (current goals can now be found in the care plan section) Progress towards PT goals: Progressing toward goals    Frequency    7X/week      PT Plan Current plan remains appropriate    Co-evaluation              AM-PAC PT "6 Clicks" Mobility   Outcome Measure  Help needed turning from your  back to your side while in a flat bed without using bedrails?: A Little Help needed moving from lying on your back to sitting on the side of a flat bed without using bedrails?: A Little Help needed moving to and from a bed to a chair (including a wheelchair)?: A Little Help needed standing up from a chair using your arms (e.g., wheelchair or bedside chair)?: A Little Help needed to walk in hospital room?: A Little Help needed climbing 3-5 steps with a railing? : A Little 6 Click Score: 18    End of Session Equipment Utilized During Treatment: Gait belt;Right knee immobilizer Activity Tolerance: Patient tolerated treatment well Patient left: in chair;with call bell/phone within reach;with chair alarm  set   PT Visit Diagnosis: Pain;Other abnormalities of gait and mobility (R26.89) Pain - Right/Left: Right Pain - part of body: Knee     Time: 0865-7846 PT Time Calculation (min) (ACUTE ONLY): 24 min  Charges:  $Gait Training: 8-22 mins $Therapeutic Exercise: 8-22 mins                         Faye Ramsay, PT Acute Rehabilitation  Office: [phone] Pager: [phone]

## 2021-05-31 NOTE — Anesthesia Procedure Notes (Signed)
Anesthesia Regional Block: Adductor canal block   Pre-Anesthetic Checklist: , timeout performed,  Correct Patient, Correct Site, Correct Laterality,  Correct Procedure, Correct Position, site marked,  Risks and benefits discussed,  Surgical consent,  Pre-op evaluation,  At surgeon's request and post-op pain management  Laterality: Right  Prep: chloraprep       Needles:  Injection technique: Single-shot  Needle Type: Echogenic Stimulator Needle     Needle Length: 10cm  Needle Gauge: 20     Additional Needles:   Narrative:  Start time: 05/30/2021 6:40 AM End time: 05/30/2021 6:45 AM Injection made incrementally with aspirations every 5 mL.  Performed by: Personally  Anesthesiologist: Mellody Dance, MD  Additional Notes: A functioning IV was confirmed and monitors were applied.  Sterile prep and drape, hand hygiene and sterile gloves were used.  Negative aspiration and test dose prior to incremental administration of local anesthetic. The patient tolerated the procedure well.Ultrasound  guidance: relevant anatomy identified, needle position confirmed, local anesthetic spread visualized around nerve(s), vascular puncture avoided.  Image printed for medical record.

## 2021-05-31 NOTE — Discharge Summary (Signed)
Patient ID: Phillip Kennedy MRN: [id number] DOB/AGE: 27-Jun-1956 65 y.o.  Admit date: 05/30/2021 Discharge date: 05/31/2021  Admission Diagnoses:  Active Problems:   Osteoarthritis of right knee   Primary localized osteoarthritis of right knee   Discharge Diagnoses:  Same  Past Medical History:  Diagnosis Date   Arthritis    "knees and back" (12/24/2017)   CHF (congestive heart failure) (HCC)    Chronic bronchitis (HCC)    Chronic kidney disease    COPD (chronic obstructive pulmonary disease) (HCC)    Gold 0   Diabetes mellitus without complication (HCC)    High cholesterol    Hypertension     Surgeries: Procedure(s): RIGHT TOTAL KNEE ARTHROPLASTY on 05/30/2021   Consultants:   Discharged Condition: Improved  Hospital Course: Tayvion Lauder is an 65 y.o. male who was admitted 05/30/2021 for operative treatment of<principal problem not specified>. Patient has severe unremitting pain that affects sleep, daily activities, and work/hobbies. After pre-op clearance the patient was taken to the operating room on 05/30/2021 and underwent  Procedure(s): RIGHT TOTAL KNEE ARTHROPLASTY.    Patient was given perioperative antibiotics:  Anti-infectives (From admission, onward)    Start     Dose/Rate Route Frequency Ordered Stop   05/30/21 1400  ceFAZolin (ANCEF) IVPB 2g/100 mL premix        2 g 200 mL/hr over 30 Minutes Intravenous Every 6 hours 05/30/21 1335 05/30/21 2130   05/30/21 0600  ceFAZolin (ANCEF) IVPB 2g/100 mL premix        2 g 200 mL/hr over 30 Minutes Intravenous On call to O.R. 05/30/21 0530 05/30/21 0806        Patient was given sequential compression devices, early ambulation, and chemoprophylaxis to prevent DVT.  Patient benefited maximally from hospital stay and there were no complications.    Recent vital signs: Patient Vitals for the past 24 hrs:  BP Temp Temp src Pulse Resp SpO2  05/31/21 0942 (!) 150/93 98.2 F (36.8 C) Oral 66 18 99 %  05/31/21 0830 (!)  170/103 98.8 F (37.1 C) Oral 67 17 100 %  05/31/21 0413 (!) 150/83 98.1 F (36.7 C) Oral 62 18 99 %  05/30/21 1956 (!) 143/88 98 F (36.7 C) Oral 62 18 100 %  05/30/21 1728 (!) 143/95 98 F (36.7 C) Oral 67 18 98 %  05/30/21 1535 (!) 141/106 98 F (36.7 C) Oral 75 19 100 %     Recent laboratory studies:  Recent Labs    05/31/21 0338  WBC 8.4  HGB 9.7*  HCT 31.4*  PLT 163  NA 137  K 4.0  CL 107  CO2 25  BUN 30*  CREATININE 1.51*  GLUCOSE 128*  CALCIUM 8.4*     Discharge Medications:   Allergies as of 05/31/2021   No Known Allergies      Medication List     STOP taking these medications    Bayer Low Dose 81 MG EC tablet Generic drug: aspirin Replaced by: aspirin 81 MG chewable tablet       TAKE these medications    acetaminophen 500 MG tablet Commonly known as: TYLENOL Take 1,000 mg by mouth every 6 (six) hours as needed for mild pain.   amLODipine 5 MG tablet Commonly known as: NORVASC Take 1 tablet (5 mg total) by mouth daily.   aspirin 81 MG chewable tablet Chew 1 tablet (81 mg total) by mouth 2 (two) times daily. To prevent blood clots after surgery Replaces: Bayer Low  Dose 81 MG EC tablet   atorvastatin 40 MG tablet Commonly known as: LIPITOR Take 40 mg by mouth every evening.   B-12 PO Take 1 tablet by mouth daily.   BIOFREEZE EX Apply 1 application topically daily.   cloNIDine 0.2 MG tablet Commonly known as: CATAPRES Take 0.2 mg by mouth at bedtime.   docusate sodium 100 MG capsule Commonly known as: COLACE 1 po BID while on narcotics  STOOL SOFTNER   FISH OIL PO Take 1 capsule by mouth daily.   hydrALAZINE 50 MG tablet Commonly known as: APRESOLINE Take 1 tablet (50 mg total) by mouth 3 (three) times daily.   hydrocortisone cream 1 % Apply 1 application topically daily as needed for itching.   isosorbide mononitrate 120 MG 24 hr tablet Commonly known as: IMDUR Take 120 mg by mouth at bedtime.   linagliptin 5 MG Tabs  tablet Commonly known as: TRADJENTA Take 1 tablet (5 mg total) by mouth daily.   metoprolol tartrate 25 MG tablet Commonly known as: LOPRESSOR Take 1 tablet (25 mg total) by mouth 2 (two) times daily.   oxyCODONE 5 MG immediate release tablet Commonly known as: Oxy IR/ROXICODONE 1 tablet po q 4 hrs prn pain patient had total knee replacement on 05/31/21   polyethylene glycol 17 g packet Commonly known as: MIRALAX / GLYCOLAX 17grams in 6 oz of SOMETHING TO DRINGK twice a day until bowel movement.  LAXITIVE.  Restart if two days since last bowel movement   ProAir HFA 108 (90 Base) MCG/ACT inhaler Generic drug: albuterol Inhale 2 puffs into the lungs every 4 (four) hours as needed for shortness of breath.   torsemide 20 MG tablet Commonly known as: DEMADEX Take 20 mg by mouth See admin instructions. Take 20 mg by mouth on Monday and Thursday as needed for fluid   VITAMIN D3 PO Take 1 capsule by mouth daily with breakfast.               Discharge Care Instructions  (From admission, onward)           Start     Ordered   05/31/21 0000  Change dressing       Comments: DO NOT REMOVE BANDAGE OVER SURGICAL INCISION.  WASH WHOLE LEG INCLUDING OVER THE WATERPROOF BANDAGE WITH SOAP AND WATER EVERY DAY.   05/31/21 1507            Diagnostic Studies: No results found.  Disposition: Discharge disposition: 01-Home or Self Care       Discharge Instructions     CPM   Complete by: As directed    Continuous passive motion machine (CPM):      Use the CPM from 0 to 90 for 6 hours per day.       You may break it up into 2 or 3 sessions per day.      Use CPM for 2 weeks or until you are told to stop.   Call MD / Call 911   Complete by: As directed    If you experience chest pain or shortness of breath, CALL 911 and be transported to the hospital emergency room.  If you develope a fever above 101 F, pus (white drainage) or increased drainage or redness at the wound, or  calf pain, call your surgeon's office.   Change dressing   Complete by: As directed    DO NOT REMOVE BANDAGE OVER SURGICAL INCISION.  WASH WHOLE LEG INCLUDING OVER THE WATERPROOF BANDAGE  WITH SOAP AND WATER EVERY DAY.   Constipation Prevention   Complete by: As directed    Drink plenty of fluids.  Prune juice may be helpful.  You may use a stool softener, such as Colace (over the counter) 100 mg twice a day.  Use MiraLax (over the counter) for constipation as needed.   Diet - low sodium heart healthy   Complete by: As directed    Discharge instructions   Complete by: As directed    INSTRUCTIONS AFTER JOINT REPLACEMENT   Remove items at home which could result in a fall. This includes throw rugs or furniture in walking pathways Continue to use ice for pain and swelling. You may notice swelling that will progress down to the foot and ankle.  This is normal after surgery.  Elevate your leg when you are not up walking on it.   Continue to use the breathing machine you got in the hospital (incentive spirometer) which will help keep your temperature down.  It is common for your temperature to cycle up and down following surgery, especially at night when you are not up moving around and exerting yourself.  The breathing machine keeps your lungs expanded and your temperature down.   DIET:  As you were doing prior to hospitalization, we recommend a well-balanced diet.  DRESSING / WOUND CARE / SHOWERING  Keep the surgical dressing until follow up.  The dressing is water proof, so you can shower without any extra covering.  IF THE DRESSING FALLS OFF or the wound gets wet inside, change the dressing with sterile gauze.  Please use good hand washing techniques before changing the dressing.  Do not use any lotions or creams on the incision until instructed by your surgeon.    ACTIVITY  Increase activity slowly as tolerated, but follow the weight bearing instructions below.   No driving for 6 weeks or  until further direction given by your physician.  You cannot drive while taking narcotics.  No lifting or carrying greater than 10 lbs. until further directed by your surgeon. Avoid periods of inactivity such as sitting longer than an hour when not asleep. This helps prevent blood clots.  You may return to work once you are authorized by your doctor.     WEIGHT BEARING   Weight bearing as tolerated with assist device (walker, cane, etc) as directed, use it as long as suggested by your surgeon or therapist, typically at least 2-4 weeks.   EXERCISES  Results after joint replacement surgery are often greatly improved when you follow the exercise, range of motion and muscle strengthening exercises prescribed by your doctor. Safety measures are also important to protect the joint from further injury. Any time any of these exercises cause you to have increased pain or swelling, decrease what you are doing until you are comfortable again and then slowly increase them. If you have problems or questions, call your caregiver or physical therapist for advice.   Rehabilitation is important following a joint replacement. After just a few days of immobilization, the muscles of the leg can become weakened and shrink (atrophy).  These exercises are designed to build up the tone and strength of the thigh and leg muscles and to improve motion. Often times heat used for twenty to thirty minutes before working out will loosen up your tissues and help with improving the range of motion but do not use heat for the first two weeks following surgery (sometimes heat can increase post-operative swelling).  These exercises can be done on a training (exercise) mat, on the floor, on a table or on a bed. Use whatever works the best and is most comfortable for you.    Use music or television while you are exercising so that the exercises are a pleasant break in your day. This will make your life better with the exercises acting  as a break in your routine that you can look forward to.   Perform all exercises about fifteen times, three times per day or as directed.  You should exercise both the operative leg and the other leg as well.  Exercises include:   Quad Sets - Tighten up the muscle on the front of the thigh (Quad) and hold for 5-10 seconds.   Straight Leg Raises - With your knee straight (if you were given a brace, keep it on), lift the leg to 60 degrees, hold for 3 seconds, and slowly lower the leg.  Perform this exercise against resistance later as your leg gets stronger.  Leg Slides: Lying on your back, slowly slide your foot toward your buttocks, bending your knee up off the floor (only go as far as is comfortable). Then slowly slide your foot back down until your leg is flat on the floor again.  Angel Wings: Lying on your back spread your legs to the side as far apart as you can without causing discomfort.  Hamstring Strength:  Lying on your back, push your heel against the floor with your leg straight by tightening up the muscles of your buttocks.  Repeat, but this time bend your knee to a comfortable angle, and push your heel against the floor.  You may put a pillow under the heel to make it more comfortable if necessary.   A rehabilitation program following joint replacement surgery can speed recovery and prevent re-injury in the future due to weakened muscles. Contact your doctor or a physical therapist for more information on knee rehabilitation.    CONSTIPATION  Constipation is defined medically as fewer than three stools per week and severe constipation as less than one stool per week.  Even if you have a regular bowel pattern at home, your normal regimen is likely to be disrupted due to multiple reasons following surgery.  Combination of anesthesia, postoperative narcotics, change in appetite and fluid intake all can affect your bowels.   YOU MUST use at least one of the following options; they are  listed in order of increasing strength to get the job done.  They are all available over the counter, and you may need to use some, POSSIBLY even all of these options:    Drink plenty of fluids (prune juice may be helpful) and high fiber foods Colace 100 mg by mouth twice a day  Senokot for constipation as directed and as needed Dulcolax (bisacodyl), take with full glass of water  Miralax (polyethylene glycol) once or twice a day as needed.  If you have tried all these things and are unable to have a bowel movement in the first 3-4 days after surgery call either your surgeon or your primary doctor.    If you experience loose stools or diarrhea, hold the medications until you stool forms back up.  If your symptoms do not get better within 1 week or if they get worse, check with your doctor.  If you experience "the worst abdominal pain ever" or develop nausea or vomiting, please contact the office immediately for further recommendations for treatment.  ITCHING:  If you experience itching with your medications, try taking only a single pain pill, or even half a pain pill at a time.  You can also use Benadryl over the counter for itching or also to help with sleep.   TED HOSE STOCKINGS:  Use stockings on both legs until for at least 2 weeks or as directed by physician office. They may be removed at night for sleeping.  MEDICATIONS:  See your medication summary on the "After Visit Summary" that nursing will review with you.  You may have some home medications which will be placed on hold until you complete the course of blood thinner medication.  It is important for you to complete the blood thinner medication as prescribed.  PRECAUTIONS:  If you experience chest pain or shortness of breath - call 911 immediately for transfer to the hospital emergency department.   If you develop a fever greater that 101 F, purulent drainage from wound, increased redness or drainage from wound, foul odor from the  wound/dressing, or calf pain - CONTACT YOUR SURGEON.                                                   FOLLOW-UP APPOINTMENTS:  If you do not already have a post-op appointment, please call the office for an appointment to be seen by your surgeon.  Guidelines for how soon to be seen are listed in your "After Visit Summary", but are typically between 1-4 weeks after surgery.  OTHER INSTRUCTIONS:   Knee Replacement:  Do not place pillow under knee, focus on keeping the knee straight while resting. CPM instructions: 0-90 degrees, 2 hours in the morning, 2 hours in the afternoon, and 2 hours in the evening. Place foam block, curve side up under heel at all times except when in CPM or when walking.  DO NOT modify, tear, cut, or change the foam block in any way.  POST-OPERATIVE OPIOID TAPER INSTRUCTIONS: It is important to wean off of your opioid medication as soon as possible. If you do not need pain medication after your surgery it is ok to stop day one. Opioids include: Codeine, Hydrocodone(Norco, Vicodin), Oxycodone(Percocet, oxycontin) and hydromorphone amongst others.  Long term and even short term use of opiods can cause: Increased pain response Dependence Constipation Depression Respiratory depression And more.  Withdrawal symptoms can include Flu like symptoms Nausea, vomiting And more Techniques to manage these symptoms Hydrate well Eat regular healthy meals Stay active Use relaxation techniques(deep breathing, meditating, yoga) Do Not substitute Alcohol to help with tapering If you have been on opioids for less than two weeks and do not have pain than it is ok to stop all together.  Plan to wean off of opioids This plan should start within one week post op of your joint replacement. Maintain the same interval or time between taking each dose and first decrease the dose.  Cut the total daily intake of opioids by one tablet each day Next start to increase the time between  doses. The last dose that should be eliminated is the evening dose.     MAKE SURE YOU:  Understand these instructions.  Get help right away if you are not doing well or get worse.    Thank you for letting us be a part of your medical care team.  It is  a privilege we respect greatly.  We hope these instructions will help you stay on track for a fast and full recovery!   Do not put a pillow under the knee. Place it under the heel.   Complete by: As directed    Place gray foam block, curve side up under heel at all times except when in CPM or when walking.  DO NOT modify, tear, cut, or change in any way the gray foam block.   Increase activity slowly as tolerated   Complete by: As directed    Patient may shower   Complete by: As directed    Aquacel dressing is water proof    Wash over it and the whole leg with soap and water at the end of your shower   Post-operative opioid taper instructions:   Complete by: As directed    POST-OPERATIVE OPIOID TAPER INSTRUCTIONS: It is important to wean off of your opioid medication as soon as possible. If you do not need pain medication after your surgery it is ok to stop day one. Opioids include: Codeine, Hydrocodone(Norco, Vicodin), Oxycodone(Percocet, oxycontin) and hydromorphone amongst others.  Long term and even short term use of opiods can cause: Increased pain response Dependence Constipation Depression Respiratory depression And more.  Withdrawal symptoms can include Flu like symptoms Nausea, vomiting And more Techniques to manage these symptoms Hydrate well Eat regular healthy meals Stay active Use relaxation techniques(deep breathing, meditating, yoga) Do Not substitute Alcohol to help with tapering If you have been on opioids for less than two weeks and do not have pain than it is ok to stop all together.  Plan to wean off of opioids This plan should start within one week post op of your joint replacement. Maintain the same  interval or time between taking each dose and first decrease the dose.  Cut the total daily intake of opioids by one tablet each day Next start to increase the time between doses. The last dose that should be eliminated is the evening dose.      TED hose   Complete by: As directed    Use stockings (TED hose) for 2 weeks on both leg(s).  You may remove them at night for sleeping.        Follow-up Information     Sheral Apley, MD Follow up on 06/14/2021.   Specialty: Orthopedic Surgery Why: appt time is 4:00 pm Contact information: [address] [place] [address] [phone]         [facility] Follow up on 06/03/2021.   Specialty: Orthopedic Surgery Why: physical therapy intial eval at Dr Greig Right office 9 am with Alinda Dooms information: [facility] [address] [place] [address] [phone]                  Signed: Pascal Lux 05/31/2021, 3:07 PM

## 2021-06-01 ENCOUNTER — Encounter (HOSPITAL_COMMUNITY): Payer: Self-pay | Admitting: Orthopedic Surgery

## 2021-08-04 ENCOUNTER — Ambulatory Visit (INDEPENDENT_AMBULATORY_CARE_PROVIDER_SITE_OTHER): Payer: Medicare Other | Admitting: Podiatry

## 2021-08-04 ENCOUNTER — Other Ambulatory Visit: Payer: Self-pay

## 2021-08-04 DIAGNOSIS — I872 Venous insufficiency (chronic) (peripheral): Secondary | ICD-10-CM

## 2021-08-04 DIAGNOSIS — I890 Lymphedema, not elsewhere classified: Secondary | ICD-10-CM

## 2021-08-04 NOTE — Progress Notes (Signed)
  Subjective:  Patient ID: Phillip Kennedy, male    DOB: [birth year],  MRN: [id number]  Chief Complaint  Patient presents with   Venous     Follow up right foot edema. Pt states no change. Pt states he has been to [facility] for U/S he stated he did received the report.    65 y.o. male presents with the above complaint. History confirmed with patient.   Objective:  Physical Exam: warm, good capillary refill, no trophic changes or ulcerative lesions, normal DP and PT pulses and normal sensory exam. Left Foot: normal exam, no swelling, tenderness, instability; ligaments intact, full range of motion of all ankle/foot joints  Right Foot: significant edema, pitting with skin trophic changes. No warmth, erythema, signs of acute infection noted.   Assessment:   1. Lymphedema   2. Venous (peripheral) insufficiency      Plan:  Patient was evaluated and treated and all questions answered.  Lymphedema vs Venous reflux RLE -Reviewed reports from [facility] and reviewed with patient. I concur with their findings (report scanned in patient's chart) that he would benefit from lymphedema pumps. I discussed that he can continue care with them and follow up here as needed. All questions answered.  No follow-ups on file.

## 2021-10-08 ENCOUNTER — Emergency Department (HOSPITAL_COMMUNITY): Payer: Medicare Other

## 2021-10-08 ENCOUNTER — Inpatient Hospital Stay (HOSPITAL_COMMUNITY)
Admission: EM | Admit: 2021-10-08 | Discharge: 2021-10-12 | DRG: 291 | Disposition: A | Discharge status: Home / Self Care | Payer: Medicare Other | Attending: Family Medicine | Admitting: Family Medicine

## 2021-10-08 ENCOUNTER — Encounter (HOSPITAL_COMMUNITY): Payer: Self-pay | Admitting: Emergency Medicine

## 2021-10-08 DIAGNOSIS — D638 Anemia in other chronic diseases classified elsewhere: Secondary | ICD-10-CM | POA: Diagnosis present

## 2021-10-08 DIAGNOSIS — Z20822 Contact with and (suspected) exposure to covid-19: Secondary | ICD-10-CM | POA: Diagnosis present

## 2021-10-08 DIAGNOSIS — N179 Acute kidney failure, unspecified: Secondary | ICD-10-CM | POA: Diagnosis not present

## 2021-10-08 DIAGNOSIS — E1165 Type 2 diabetes mellitus with hyperglycemia: Secondary | ICD-10-CM | POA: Diagnosis present

## 2021-10-08 DIAGNOSIS — I110 Hypertensive heart disease with heart failure: Principal | ICD-10-CM | POA: Diagnosis present

## 2021-10-08 DIAGNOSIS — M7989 Other specified soft tissue disorders: Secondary | ICD-10-CM | POA: Diagnosis present

## 2021-10-08 DIAGNOSIS — Z79899 Other long term (current) drug therapy: Secondary | ICD-10-CM

## 2021-10-08 DIAGNOSIS — D696 Thrombocytopenia, unspecified: Secondary | ICD-10-CM | POA: Diagnosis present

## 2021-10-08 DIAGNOSIS — I5043 Acute on chronic combined systolic (congestive) and diastolic (congestive) heart failure: Secondary | ICD-10-CM | POA: Diagnosis present

## 2021-10-08 DIAGNOSIS — Z23 Encounter for immunization: Secondary | ICD-10-CM

## 2021-10-08 DIAGNOSIS — J440 Chronic obstructive pulmonary disease with acute lower respiratory infection: Secondary | ICD-10-CM | POA: Diagnosis present

## 2021-10-08 DIAGNOSIS — J9601 Acute respiratory failure with hypoxia: Secondary | ICD-10-CM | POA: Diagnosis present

## 2021-10-08 DIAGNOSIS — I420 Dilated cardiomyopathy: Secondary | ICD-10-CM | POA: Diagnosis present

## 2021-10-08 DIAGNOSIS — E669 Obesity, unspecified: Secondary | ICD-10-CM | POA: Diagnosis present

## 2021-10-08 DIAGNOSIS — I959 Hypotension, unspecified: Secondary | ICD-10-CM | POA: Diagnosis not present

## 2021-10-08 DIAGNOSIS — J9602 Acute respiratory failure with hypercapnia: Secondary | ICD-10-CM | POA: Diagnosis present

## 2021-10-08 DIAGNOSIS — Z91119 Patient's noncompliance with dietary regimen due to unspecified reason: Secondary | ICD-10-CM

## 2021-10-08 DIAGNOSIS — I161 Hypertensive emergency: Secondary | ICD-10-CM

## 2021-10-08 DIAGNOSIS — R739 Hyperglycemia, unspecified: Secondary | ICD-10-CM | POA: Diagnosis not present

## 2021-10-08 DIAGNOSIS — R0603 Acute respiratory distress: Secondary | ICD-10-CM

## 2021-10-08 DIAGNOSIS — Z7984 Long term (current) use of oral hypoglycemic drugs: Secondary | ICD-10-CM

## 2021-10-08 DIAGNOSIS — J189 Pneumonia, unspecified organism: Secondary | ICD-10-CM | POA: Diagnosis present

## 2021-10-08 DIAGNOSIS — J441 Chronic obstructive pulmonary disease with (acute) exacerbation: Secondary | ICD-10-CM | POA: Diagnosis present

## 2021-10-08 DIAGNOSIS — J811 Chronic pulmonary edema: Secondary | ICD-10-CM

## 2021-10-08 DIAGNOSIS — Z7982 Long term (current) use of aspirin: Secondary | ICD-10-CM

## 2021-10-08 DIAGNOSIS — F17210 Nicotine dependence, cigarettes, uncomplicated: Secondary | ICD-10-CM | POA: Diagnosis present

## 2021-10-08 DIAGNOSIS — Z9114 Patient's other noncompliance with medication regimen: Secondary | ICD-10-CM

## 2021-10-08 DIAGNOSIS — G9341 Metabolic encephalopathy: Secondary | ICD-10-CM | POA: Diagnosis present

## 2021-10-08 MED ORDER — PROPOFOL 1000 MG/100ML IV EMUL
INTRAVENOUS | Status: AC
  Administered 2021-10-08: 23:00:00 10 ug/kg/min via INTRAVENOUS
  Filled 2021-10-08: qty 100

## 2021-10-08 MED ORDER — METHYLPREDNISOLONE SODIUM SUCC 125 MG IJ SOLR
125.0000 mg | Freq: Once | INTRAMUSCULAR | Status: AC
  Administered 2021-10-09: 125 mg via INTRAVENOUS
  Filled 2021-10-08: qty 2

## 2021-10-08 MED ORDER — KETAMINE HCL 50 MG/5ML IJ SOSY
PREFILLED_SYRINGE | INTRAMUSCULAR | Status: AC
  Filled 2021-10-08: qty 5

## 2021-10-08 MED ORDER — ALBUTEROL SULFATE (2.5 MG/3ML) 0.083% IN NEBU
15.0000 mg | INHALATION_SOLUTION | Freq: Once | RESPIRATORY_TRACT | Status: AC
  Administered 2021-10-08: 23:00:00 15 mg via RESPIRATORY_TRACT
  Filled 2021-10-08: qty 18

## 2021-10-08 MED ORDER — NITROGLYCERIN IN D5W 200-5 MCG/ML-% IV SOLN
0.0000 ug/min | INTRAVENOUS | Status: DC
Start: 2021-10-08 — End: 2021-10-09
  Administered 2021-10-09: 10 ug/min via INTRAVENOUS
  Filled 2021-10-08: qty 250

## 2021-10-08 MED ORDER — FENTANYL CITRATE PF 50 MCG/ML IJ SOSY
PREFILLED_SYRINGE | INTRAMUSCULAR | Status: AC
  Filled 2021-10-08: qty 2

## 2021-10-08 MED ORDER — PROPOFOL 1000 MG/100ML IV EMUL: 5.0000 ug/kg/min | INTRAVENOUS | Status: DC

## 2021-10-08 MED ORDER — ETOMIDATE 2 MG/ML IV SOLN
INTRAVENOUS | Status: DC | PRN
  Administered 2021-10-08: 20 mg via INTRAVENOUS

## 2021-10-08 MED ORDER — MIDAZOLAM HCL 2 MG/2ML IJ SOLN
INTRAMUSCULAR | Status: AC
  Filled 2021-10-08: qty 2

## 2021-10-08 MED ORDER — SUCCINYLCHOLINE CHLORIDE 200 MG/10ML IV SOSY
PREFILLED_SYRINGE | INTRAVENOUS | Status: AC
  Filled 2021-10-08: qty 10

## 2021-10-08 MED ORDER — IPRATROPIUM BROMIDE 0.02 % IN SOLN
0.5000 mg | Freq: Once | RESPIRATORY_TRACT | Status: AC
  Administered 2021-10-08: 23:00:00 0.5 mg via RESPIRATORY_TRACT
  Filled 2021-10-08: qty 2.5

## 2021-10-08 MED ORDER — ROCURONIUM BROMIDE 50 MG/5ML IV SOLN
INTRAVENOUS | Status: DC | PRN
  Administered 2021-10-08: 100 mg via INTRAVENOUS

## 2021-10-08 MED ORDER — ETOMIDATE 2 MG/ML IV SOLN
INTRAVENOUS | Status: AC
  Filled 2021-10-08: qty 20

## 2021-10-08 MED ORDER — ROCURONIUM BROMIDE 10 MG/ML (PF) SYRINGE
PREFILLED_SYRINGE | INTRAVENOUS | Status: AC
  Filled 2021-10-08: qty 10

## 2021-10-08 NOTE — ED Notes (Signed)
EDP at pt bedside, attempting [place] IV access.

## 2021-10-08 NOTE — ED Triage Notes (Addendum)
[facility] for SOB on CPAP. Pt was attempting to drive himself to hospital from a gas station on [address] for SOB ongoing x1 day. 1 duoneb initially given to EMS without improvement. Poor lung sounds by EMS in all fields. Room air 79%, no improvement with CPAP HR 125 CBG 236  18G IV in L AC  Hx CHF and COPD

## 2021-10-08 NOTE — Progress Notes (Signed)
15mg  albuterol, 0.5 atrovent per order.

## 2021-10-08 NOTE — ED Provider Notes (Signed)
[facility] EMERGENCY DEPARTMENT Provider Note   CSN: [id number] Arrival date & time:        History No chief complaint on file.   Phillip Kennedy is a 65 y.o. male.  Patient brought in by EMS for shortness of breath on CPAP.  Reportedly, patient was attempting to drive himself to the hospital due to the shortness of breath, but stopped at a gas station called 911.  EMS reports that his O2 saturation was 79% without any improvement on CPAP.  Reportedly has extensive history of CHF and COPD.  Patient is unable to provide any history secondary to acuity of condition.  Level 5 caveat applies.  The history is provided by the EMS personnel. No language interpreter was used.      No past medical history on file.  There are no problems to display for this patient.   History reviewed. No pertinent surgical history.     No family history on file.     Home Medications Prior to Admission medications   Not on File    Allergies    Patient has no allergy information on record.  Review of Systems   Review of Systems  Unable to perform ROS: Acuity of condition   Physical Exam Updated Vital Signs Pulse (!) 123    Resp (!) 27   Physical Exam Vitals and nursing note reviewed.  Constitutional:      General: He is in acute distress.     Appearance: He is well-developed.     Comments: obtunded  HENT:     Head: Normocephalic and atraumatic.  Eyes:     Conjunctiva/sclera: Conjunctivae normal.  Cardiovascular:     Rate and Rhythm: Normal rate and regular rhythm.     Heart sounds: No murmur heard. Pulmonary:     Effort: Respiratory distress present.     Comments: Respiratory distress Abdominal:     General: There is no distension.     Palpations: Abdomen is soft.     Tenderness: There is no abdominal tenderness.  Musculoskeletal:        General: No swelling.     Cervical back: Neck supple.  Skin:    Capillary Refill: Capillary refill takes less than 2  seconds.     Comments: diaphoretic  Neurological:     Comments: obtunded  Psychiatric:        Mood and Affect: Mood normal.    ED Results / Procedures / Treatments   Labs (all labs ordered are listed, but only abnormal results are displayed) Labs Reviewed  I-STAT CHEM 8, ED - Abnormal; Notable for the following components:      Result Value   BUN 29 (*)    Creatinine, Ser 1.70 (*)    Glucose, Bld 222 (*)    All other components within normal limits  I-STAT ARTERIAL BLOOD GAS, ED - Abnormal; Notable for the following components:   pH, Arterial 7.182 (*)    pCO2 arterial 67.1 (*)    pO2, Arterial 417 (*)    Acid-base deficit 4.0 (*)    All other components within normal limits  RESP PANEL BY RT-PCR (FLU A&B, COVID) ARPGX2  CBC  ETHANOL  BLOOD GAS, ARTERIAL  COMPREHENSIVE METABOLIC PANEL  URINALYSIS, ROUTINE W REFLEX MICROSCOPIC  RAPID URINE DRUG SCREEN, HOSP PERFORMED  BRAIN NATRIURETIC PEPTIDE  TROPONIN I (HIGH SENSITIVITY)    EKG None  Radiology No results found.  Procedures Procedure Name: Intubation Date/Time: 10/08/2021 11:17 PM Performed  by: Roxy Horseman, PA-C Pre-anesthesia Checklist: Patient identified, Patient being monitored, Emergency Drugs available, Timeout performed and Suction available Oxygen Delivery Method: Non-rebreather mask Preoxygenation: Pre-oxygenation with 100% oxygen Induction Type: Rapid sequence Ventilation: Mask ventilation with difficulty Tube size: 7.5 mm Number of attempts: 1 Airway Equipment and Method: Video-laryngoscopy Placement Confirmation: ETT inserted through vocal cords under direct vision, CO2 detector, Breath sounds checked- equal and bilateral and Positive ETCO2 Secured at: 25 cm Tube secured with: ETT holder Dental Injury: Teeth and Oropharynx as per pre-operative assessment     .Critical Care Performed by: Roxy Horseman, PA-C Authorized by: Roxy Horseman, PA-C   Critical care provider statement:     Critical care time (minutes):  55   Critical care was necessary to treat or prevent imminent or life-threatening deterioration of the following conditions:  Respiratory failure   Critical care was time spent personally by me on the following activities:  Development of treatment plan with patient or surrogate, discussions with consultants, evaluation of patient's response to treatment, examination of patient, ordering and review of laboratory studies, ordering and review of radiographic studies, ordering and performing treatments and interventions, pulse oximetry, re-evaluation of patient's condition and review of old charts   Medications Ordered in ED Medications  albuterol (PROVENTIL) (2.5 MG/3ML) 0.083% nebulizer solution 15 mg (has no administration in time range)  ipratropium (ATROVENT) nebulizer solution 0.5 mg (has no administration in time range)  methylPREDNISolone sodium succinate (SOLU-MEDROL) 125 mg/2 mL injection 125 mg (has no administration in time range)  rocuronium bromide 100 MG/10ML SOSY (has no administration in time range)  succinylcholine (ANECTINE) 200 MG/10ML syringe (has no administration in time range)  midazolam (VERSED) 2 MG/2ML injection (has no administration in time range)  fentaNYL (SUBLIMAZE) 50 MCG/ML injection (has no administration in time range)  etomidate (AMIDATE) 2 MG/ML injection (has no administration in time range)  ketamine HCl 50 MG/5ML SOSY (has no administration in time range)  etomidate (AMIDATE) injection (20 mg Intravenous Given 10/08/21 2305)  rocuronium (ZEMURON) injection (100 mg Intravenous Given 10/08/21 2305)  propofol (DIPRIVAN) 1000 MG/100ML infusion (has no administration in time range)    ED Course  I have reviewed the triage vital signs and the nursing notes.  Pertinent labs & imaging results that were available during my care of the patient were reviewed by me and considered in my medical decision making (see chart for details).     MDM Rules/Calculators/A&P                           Patient brought in by EMS with chief complaint of shortness of breath.  They found him short of breath and crawling out of his car at a gas station.  He was reportedly hypoxic to 79%.  This did not improve with CPAP.  EMS was able to discover patient has CHF history.  He has been extremely hypertensive.  On patient's arrival in the ED, he was combative and became obtunded.  We proceeded with intubation to protect patient's airway.  This was supervised by Dr. Blinda Leatherwood, who was bedside for the entire procedure.  Patient was given nitroglycerin IV for his extreme hypertension.  I discussed case with critical care team, who will come to evaluate the patient, they recommend CT head given his hypertensive state and confusion.  RN informs me and Dr. Blinda Leatherwood that BPs have been trending down.  Nitro stopped.    BP improving after switching from propofol to fentanyl  drip for sedation.  Appreciate critical care team for admitting.  Final Clinical Impression(s) / ED Diagnoses Final diagnoses:  Hypertensive emergency  Respiratory distress    Rx / DC Orders ED Discharge Orders     None        Roxy Horseman, PA-C 10/09/21 0205    Gilda Crease, MD 10/09/21 0600

## 2021-10-08 NOTE — ED Notes (Signed)
RT attempted to place pt on CPAP. Pt diaphoretic and thrashing around

## 2021-10-08 NOTE — ED Notes (Signed)
Pt's abdomen auscultated for OG tube

## 2021-10-08 NOTE — ED Notes (Signed)
Plan to intubate pt

## 2021-10-09 ENCOUNTER — Other Ambulatory Visit: Payer: Self-pay

## 2021-10-09 ENCOUNTER — Inpatient Hospital Stay (HOSPITAL_COMMUNITY): Payer: Medicare Other

## 2021-10-09 ENCOUNTER — Encounter (HOSPITAL_COMMUNITY): Payer: Self-pay | Admitting: Internal Medicine

## 2021-10-09 DIAGNOSIS — I161 Hypertensive emergency: Secondary | ICD-10-CM | POA: Diagnosis present

## 2021-10-09 DIAGNOSIS — J440 Chronic obstructive pulmonary disease with acute lower respiratory infection: Secondary | ICD-10-CM | POA: Diagnosis present

## 2021-10-09 DIAGNOSIS — Z79899 Other long term (current) drug therapy: Secondary | ICD-10-CM | POA: Diagnosis not present

## 2021-10-09 DIAGNOSIS — J9601 Acute respiratory failure with hypoxia: Secondary | ICD-10-CM | POA: Diagnosis present

## 2021-10-09 DIAGNOSIS — R609 Edema, unspecified: Secondary | ICD-10-CM | POA: Diagnosis not present

## 2021-10-09 DIAGNOSIS — I110 Hypertensive heart disease with heart failure: Secondary | ICD-10-CM | POA: Diagnosis present

## 2021-10-09 DIAGNOSIS — J9602 Acute respiratory failure with hypercapnia: Secondary | ICD-10-CM | POA: Diagnosis present

## 2021-10-09 DIAGNOSIS — R0602 Shortness of breath: Secondary | ICD-10-CM | POA: Diagnosis not present

## 2021-10-09 DIAGNOSIS — F17210 Nicotine dependence, cigarettes, uncomplicated: Secondary | ICD-10-CM | POA: Diagnosis present

## 2021-10-09 DIAGNOSIS — Z7982 Long term (current) use of aspirin: Secondary | ICD-10-CM | POA: Diagnosis not present

## 2021-10-09 DIAGNOSIS — I5043 Acute on chronic combined systolic (congestive) and diastolic (congestive) heart failure: Secondary | ICD-10-CM | POA: Diagnosis present

## 2021-10-09 DIAGNOSIS — N179 Acute kidney failure, unspecified: Secondary | ICD-10-CM | POA: Diagnosis present

## 2021-10-09 DIAGNOSIS — G9341 Metabolic encephalopathy: Secondary | ICD-10-CM | POA: Diagnosis present

## 2021-10-09 DIAGNOSIS — Z23 Encounter for immunization: Secondary | ICD-10-CM | POA: Diagnosis present

## 2021-10-09 DIAGNOSIS — M7989 Other specified soft tissue disorders: Secondary | ICD-10-CM | POA: Diagnosis present

## 2021-10-09 DIAGNOSIS — Z20822 Contact with and (suspected) exposure to covid-19: Secondary | ICD-10-CM | POA: Diagnosis present

## 2021-10-09 DIAGNOSIS — Z91119 Patient's noncompliance with dietary regimen due to unspecified reason: Secondary | ICD-10-CM | POA: Diagnosis not present

## 2021-10-09 DIAGNOSIS — J811 Chronic pulmonary edema: Secondary | ICD-10-CM

## 2021-10-09 DIAGNOSIS — D638 Anemia in other chronic diseases classified elsewhere: Secondary | ICD-10-CM | POA: Diagnosis present

## 2021-10-09 DIAGNOSIS — D696 Thrombocytopenia, unspecified: Secondary | ICD-10-CM | POA: Diagnosis present

## 2021-10-09 DIAGNOSIS — E1165 Type 2 diabetes mellitus with hyperglycemia: Secondary | ICD-10-CM | POA: Diagnosis present

## 2021-10-09 DIAGNOSIS — I959 Hypotension, unspecified: Secondary | ICD-10-CM | POA: Diagnosis not present

## 2021-10-09 DIAGNOSIS — I420 Dilated cardiomyopathy: Secondary | ICD-10-CM | POA: Diagnosis present

## 2021-10-09 DIAGNOSIS — E669 Obesity, unspecified: Secondary | ICD-10-CM | POA: Diagnosis present

## 2021-10-09 DIAGNOSIS — R739 Hyperglycemia, unspecified: Secondary | ICD-10-CM | POA: Diagnosis not present

## 2021-10-09 DIAGNOSIS — Z9114 Patient's other noncompliance with medication regimen: Secondary | ICD-10-CM | POA: Diagnosis not present

## 2021-10-09 DIAGNOSIS — J189 Pneumonia, unspecified organism: Secondary | ICD-10-CM | POA: Diagnosis present

## 2021-10-09 DIAGNOSIS — J441 Chronic obstructive pulmonary disease with (acute) exacerbation: Secondary | ICD-10-CM | POA: Diagnosis present

## 2021-10-09 LAB — ECHOCARDIOGRAM COMPLETE
AR max vel: 3.86 cm2
AV Area VTI: 4.16 cm2
AV Area mean vel: 3.9 cm2
AV Mean grad: 7.7 mmHg
AV Peak grad: 14.6 mmHg
Ao pk vel: 1.91 m/s
Area-P 1/2: 3.6 cm2
Calc EF: 49.4 %
Height: 70 in
S' Lateral: 5.2 cm
Single Plane A2C EF: 50.1 %
Single Plane A4C EF: 48.2 %
Weight: 3174.62 oz

## 2021-10-09 LAB — RESP PANEL BY RT-PCR (FLU A&B, COVID) ARPGX2
Influenza A by PCR: NEGATIVE
Influenza B by PCR: NEGATIVE
SARS Coronavirus 2 by RT PCR: NEGATIVE

## 2021-10-09 LAB — TROPONIN I (HIGH SENSITIVITY)
Troponin I (High Sensitivity): 48 ng/L — ABNORMAL HIGH (ref <18)
Troponin I (High Sensitivity): 83 ng/L — ABNORMAL HIGH (ref <18)
Troponin I (High Sensitivity): 61 ng/L — ABNORMAL HIGH (ref <18)
Troponin I (High Sensitivity): 55 ng/L — ABNORMAL HIGH (ref <18)

## 2021-10-09 LAB — POCT I-STAT 7, (LYTES, BLD GAS, ICA,H+H)
Acid-base deficit: 4 mmol/L — ABNORMAL HIGH (ref 0.0–2.0)
Bicarbonate: 22.9 mmol/L (ref 20.0–28.0)
Calcium, Ion: 1.25 mmol/L (ref 1.15–1.40)
HCT: 34 % — ABNORMAL LOW (ref 39.0–52.0)
Hemoglobin: 11.6 g/dL — ABNORMAL LOW (ref 13.0–17.0)
O2 Saturation: 100 %
Patient temperature: 35.9
Potassium: 3.9 mmol/L (ref 3.5–5.1)
Sodium: 139 mmol/L (ref 135–145)
TCO2: 24 mmol/L (ref 22–32)
pCO2 arterial: 45.2 mmHg (ref 32.0–48.0)
pH, Arterial: 7.307 — ABNORMAL LOW (ref 7.350–7.450)
pO2, Arterial: 244 mmHg — ABNORMAL HIGH (ref 83.0–108.0)

## 2021-10-09 LAB — CBC
HCT: 43 % (ref 39.0–52.0)
HCT: 34.7 % — ABNORMAL LOW (ref 39.0–52.0)
Hemoglobin: 13.3 g/dL (ref 13.0–17.0)
Hemoglobin: 11.1 g/dL — ABNORMAL LOW (ref 13.0–17.0)
MCH: 26.9 pg (ref 26.0–34.0)
MCH: 27 pg (ref 26.0–34.0)
MCHC: 30.9 g/dL (ref 30.0–36.0)
MCHC: 32 g/dL (ref 30.0–36.0)
MCV: 86.9 fL (ref 80.0–100.0)
MCV: 84.4 fL (ref 80.0–100.0)
Platelets: 194 10*3/uL (ref 150–400)
Platelets: 146 10*3/uL — ABNORMAL LOW (ref 150–400)
RBC: 4.95 MIL/uL (ref 4.22–5.81)
RBC: 4.11 MIL/uL — ABNORMAL LOW (ref 4.22–5.81)
RDW: 14 % (ref 11.5–15.5)
RDW: 14 % (ref 11.5–15.5)
WBC: 5 10*3/uL (ref 4.0–10.5)
WBC: 5.7 10*3/uL (ref 4.0–10.5)
nRBC: 0 % (ref 0.0–0.2)
nRBC: 0 % (ref 0.0–0.2)

## 2021-10-09 LAB — BASIC METABOLIC PANEL
Anion gap: 8 (ref 5–15)
BUN: 28 mg/dL — ABNORMAL HIGH (ref 8–23)
CO2: 21 mmol/L — ABNORMAL LOW (ref 22–32)
Calcium: 8.5 mg/dL — ABNORMAL LOW (ref 8.9–10.3)
Chloride: 107 mmol/L (ref 98–111)
Creatinine, Ser: 2.13 mg/dL — ABNORMAL HIGH (ref 0.61–1.24)
GFR, Estimated: 34 mL/min — ABNORMAL LOW (ref >60)
Glucose, Bld: 162 mg/dL — ABNORMAL HIGH (ref 70–99)
Potassium: 3.9 mmol/L (ref 3.5–5.1)
Sodium: 136 mmol/L (ref 135–145)

## 2021-10-09 LAB — LACTIC ACID, PLASMA
Lactic Acid, Venous: 3.3 mmol/L (ref 0.5–1.9)
Lactic Acid, Venous: 4.4 mmol/L (ref 0.5–1.9)
Lactic Acid, Venous: 4.5 mmol/L (ref 0.5–1.9)
Lactic Acid, Venous: 3.6 mmol/L (ref 0.5–1.9)

## 2021-10-09 LAB — I-STAT ARTERIAL BLOOD GAS, ED
Acid-base deficit: 4 mmol/L — ABNORMAL HIGH (ref 0.0–2.0)
Bicarbonate: 25.2 mmol/L (ref 20.0–28.0)
Calcium, Ion: 1.28 mmol/L (ref 1.15–1.40)
HCT: 39 % (ref 39.0–52.0)
Hemoglobin: 13.3 g/dL (ref 13.0–17.0)
O2 Saturation: 100 %
Patient temperature: 98.5
Potassium: 4.2 mmol/L (ref 3.5–5.1)
Sodium: 137 mmol/L (ref 135–145)
TCO2: 27 mmol/L (ref 22–32)
pCO2 arterial: 67.1 mmHg (ref 32.0–48.0)
pH, Arterial: 7.182 — CL (ref 7.350–7.450)
pO2, Arterial: 417 mmHg — ABNORMAL HIGH (ref 83.0–108.0)

## 2021-10-09 LAB — COMPREHENSIVE METABOLIC PANEL
ALT: 41 U/L (ref 0–44)
AST: 49 U/L — ABNORMAL HIGH (ref 15–41)
Albumin: 3.7 g/dL (ref 3.5–5.0)
Alkaline Phosphatase: 85 U/L (ref 38–126)
Anion gap: 7 (ref 5–15)
BUN: 24 mg/dL — ABNORMAL HIGH (ref 8–23)
CO2: 23 mmol/L (ref 22–32)
Calcium: 8.9 mg/dL (ref 8.9–10.3)
Chloride: 105 mmol/L (ref 98–111)
Creatinine, Ser: 1.8 mg/dL — ABNORMAL HIGH (ref 0.61–1.24)
GFR, Estimated: 25 mL/min — ABNORMAL LOW (ref >60)
Glucose, Bld: 225 mg/dL — ABNORMAL HIGH (ref 70–99)
Potassium: 4.4 mmol/L (ref 3.5–5.1)
Sodium: 135 mmol/L (ref 135–145)
Total Bilirubin: 0.7 mg/dL (ref 0.3–1.2)
Total Protein: 7.2 g/dL (ref 6.5–8.1)

## 2021-10-09 LAB — ETHANOL: Alcohol, Ethyl (B): <10 mg/dL (ref <10)

## 2021-10-09 LAB — I-STAT CHEM 8, ED
BUN: 29 mg/dL — ABNORMAL HIGH (ref 8–23)
Calcium, Ion: 1.19 mmol/L (ref 1.15–1.40)
Chloride: 106 mmol/L (ref 98–111)
Creatinine, Ser: 1.7 mg/dL — ABNORMAL HIGH (ref 0.61–1.24)
Glucose, Bld: 222 mg/dL — ABNORMAL HIGH (ref 70–99)
HCT: 45 % (ref 39.0–52.0)
Hemoglobin: 15.3 g/dL (ref 13.0–17.0)
Potassium: 4.4 mmol/L (ref 3.5–5.1)
Sodium: 139 mmol/L (ref 135–145)
TCO2: 25 mmol/L (ref 22–32)

## 2021-10-09 LAB — GLUCOSE, CAPILLARY
Glucose-Capillary: 115 mg/dL — ABNORMAL HIGH (ref 70–99)
Glucose-Capillary: 124 mg/dL — ABNORMAL HIGH (ref 70–99)
Glucose-Capillary: 130 mg/dL — ABNORMAL HIGH (ref 70–99)
Glucose-Capillary: 145 mg/dL — ABNORMAL HIGH (ref 70–99)
Glucose-Capillary: 151 mg/dL — ABNORMAL HIGH (ref 70–99)
Glucose-Capillary: 192 mg/dL — ABNORMAL HIGH (ref 70–99)
Glucose-Capillary: 219 mg/dL — ABNORMAL HIGH (ref 70–99)

## 2021-10-09 LAB — HIV ANTIBODY (ROUTINE TESTING W REFLEX): HIV Screen 4th Generation wRfx: NONREACTIVE

## 2021-10-09 LAB — BRAIN NATRIURETIC PEPTIDE: B Natriuretic Peptide: 489.9 pg/mL — ABNORMAL HIGH (ref 0.0–100.0)

## 2021-10-09 LAB — HEMOGLOBIN A1C
Hgb A1c MFr Bld: 7 % — ABNORMAL HIGH (ref 4.8–5.6)
Mean Plasma Glucose: 154.2 mg/dL

## 2021-10-09 LAB — MAGNESIUM: Magnesium: 2.6 mg/dL — ABNORMAL HIGH (ref 1.7–2.4)

## 2021-10-09 LAB — MRSA NEXT GEN BY PCR, NASAL: MRSA by PCR Next Gen: NOT DETECTED

## 2021-10-09 LAB — PROCALCITONIN: Procalcitonin: 0.88 ng/mL

## 2021-10-09 LAB — AMMONIA: Ammonia: 40 umol/L — ABNORMAL HIGH (ref 9–35)

## 2021-10-09 MED ORDER — DEXMEDETOMIDINE HCL IN NACL 400 MCG/100ML IV SOLN
0.4000 ug/kg/h | INTRAVENOUS | Status: DC
  Administered 2021-10-09: 02:00:00 0.8 ug/kg/h via INTRAVENOUS
  Filled 2021-10-09: qty 100

## 2021-10-09 MED ORDER — DOCUSATE SODIUM 100 MG PO CAPS
100.0000 mg | ORAL_CAPSULE | Freq: Two times a day (BID) | ORAL | Status: DC
  Administered 2021-10-09 – 2021-10-12 (×3): 100 mg via ORAL
  Filled 2021-10-09 (×5): qty 1

## 2021-10-09 MED ORDER — FENTANYL CITRATE PF 50 MCG/ML IJ SOSY: 25.0000 ug | PREFILLED_SYRINGE | Freq: Once | INTRAMUSCULAR | Status: DC

## 2021-10-09 MED ORDER — FENTANYL BOLUS VIA INFUSION
25.0000 ug | INTRAVENOUS | Status: DC | PRN
  Filled 2021-10-09: qty 100

## 2021-10-09 MED ORDER — FENTANYL CITRATE PF 50 MCG/ML IJ SOSY
100.0000 ug | PREFILLED_SYRINGE | Freq: Once | INTRAMUSCULAR | Status: AC
  Administered 2021-10-09: 01:00:00 100 ug via INTRAVENOUS

## 2021-10-09 MED ORDER — FUROSEMIDE 10 MG/ML IJ SOLN
40.0000 mg | INTRAMUSCULAR | Status: AC
  Administered 2021-10-09: 40 mg via INTRAVENOUS
  Filled 2021-10-09: qty 4

## 2021-10-09 MED ORDER — SODIUM CHLORIDE 0.9 % IV SOLN: INTRAVENOUS | Status: DC | PRN

## 2021-10-09 MED ORDER — FENTANYL 2500MCG IN NS 250ML (10MCG/ML) PREMIX INFUSION
0.0000 ug/h | INTRAVENOUS | Status: DC
  Administered 2021-10-09: 01:00:00 25 ug/h via INTRAVENOUS
  Administered 2021-10-09: 02:00:00 50 ug/h via INTRAVENOUS
  Filled 2021-10-09: qty 250

## 2021-10-09 MED ORDER — IPRATROPIUM-ALBUTEROL 0.5-2.5 (3) MG/3ML IN SOLN
3.0000 mL | RESPIRATORY_TRACT | Status: DC
  Administered 2021-10-09 (×3): 3 mL via RESPIRATORY_TRACT
  Filled 2021-10-09 (×3): qty 3

## 2021-10-09 MED ORDER — CHLORHEXIDINE GLUCONATE 0.12% ORAL RINSE (MEDLINE KIT)
15.0000 mL | Freq: Two times a day (BID) | OROMUCOSAL | Status: DC
  Administered 2021-10-09: 08:00:00 15 mL via OROMUCOSAL

## 2021-10-09 MED ORDER — DOCUSATE SODIUM 50 MG/5ML PO LIQD: 100.0000 mg | Freq: Two times a day (BID) | ORAL | Status: DC | PRN

## 2021-10-09 MED ORDER — POLYETHYLENE GLYCOL 3350 17 G PO PACK: 17.0000 g | PACK | Freq: Every day | ORAL | Status: DC | PRN

## 2021-10-09 MED ORDER — MAGNESIUM SULFATE 2 GM/50ML IV SOLN
2.0000 g | Freq: Once | INTRAVENOUS | Status: AC
Start: 2021-10-09 — End: 2021-10-09
  Administered 2021-10-09: 04:00:00 2 g via INTRAVENOUS
  Filled 2021-10-09: qty 50

## 2021-10-09 MED ORDER — SODIUM CHLORIDE 0.9 % IV SOLN
500.0000 mg | Freq: Every day | INTRAVENOUS | Status: AC
  Administered 2021-10-09 – 2021-10-11 (×3): 500 mg via INTRAVENOUS
  Filled 2021-10-09 (×3): qty 5

## 2021-10-09 MED ORDER — METHYLPREDNISOLONE SODIUM SUCC 40 MG IJ SOLR
40.0000 mg | Freq: Two times a day (BID) | INTRAMUSCULAR | Status: DC
  Administered 2021-10-09: 08:00:00 40 mg via INTRAVENOUS
  Filled 2021-10-09: qty 1

## 2021-10-09 MED ORDER — SODIUM CHLORIDE 0.9 % IV SOLN
1.0000 g | Freq: Every day | INTRAVENOUS | Status: DC
  Administered 2021-10-09 – 2021-10-11 (×3): 1 g via INTRAVENOUS
  Filled 2021-10-09 (×3): qty 10

## 2021-10-09 MED ORDER — HYDRALAZINE HCL 20 MG/ML IJ SOLN
10.0000 mg | INTRAMUSCULAR | Status: DC | PRN
Start: 2021-10-09 — End: 2021-10-12
  Administered 2021-10-10: 10:00:00 20 mg via INTRAVENOUS
  Filled 2021-10-09: qty 1

## 2021-10-09 MED ORDER — DOCUSATE SODIUM 50 MG/5ML PO LIQD
100.0000 mg | Freq: Two times a day (BID) | ORAL | Status: DC
  Administered 2021-10-09: 09:00:00 100 mg
  Filled 2021-10-09: qty 10

## 2021-10-09 MED ORDER — ISOSORBIDE MONONITRATE ER 60 MG PO TB24
120.0000 mg | ORAL_TABLET | Freq: Every day | ORAL | Status: DC
  Administered 2021-10-09 – 2021-10-12 (×4): 120 mg via ORAL
  Filled 2021-10-09 (×3): qty 2
  Filled 2021-10-09: qty 4

## 2021-10-09 MED ORDER — BUDESONIDE 0.25 MG/2ML IN SUSP
0.2500 mg | Freq: Two times a day (BID) | RESPIRATORY_TRACT | Status: DC
  Administered 2021-10-09 – 2021-10-12 (×7): 0.25 mg via RESPIRATORY_TRACT
  Filled 2021-10-09 (×7): qty 2

## 2021-10-09 MED ORDER — HEPARIN SODIUM (PORCINE) 5000 UNIT/ML IJ SOLN
5000.0000 [IU] | Freq: Three times a day (TID) | INTRAMUSCULAR | Status: DC
  Administered 2021-10-09 – 2021-10-12 (×10): 5000 [IU] via SUBCUTANEOUS
  Filled 2021-10-09 (×10): qty 1

## 2021-10-09 MED ORDER — POLYETHYLENE GLYCOL 3350 17 G PO PACK: 17.0000 g | PACK | Freq: Every day | ORAL | Status: DC

## 2021-10-09 MED ORDER — LABETALOL HCL 5 MG/ML IV SOLN: 5.0000 mg | INTRAVENOUS | Status: DC | PRN

## 2021-10-09 MED ORDER — CHLORHEXIDINE GLUCONATE CLOTH 2 % EX PADS
6.0000 | MEDICATED_PAD | Freq: Every day | CUTANEOUS | Status: DC
  Administered 2021-10-09: 03:00:00 6 via TOPICAL

## 2021-10-09 MED ORDER — HYDRALAZINE HCL 50 MG PO TABS
50.0000 mg | ORAL_TABLET | Freq: Three times a day (TID) | ORAL | Status: DC
  Administered 2021-10-09 – 2021-10-11 (×6): 50 mg via ORAL
  Filled 2021-10-09 (×6): qty 1

## 2021-10-09 MED ORDER — ALBUTEROL SULFATE (2.5 MG/3ML) 0.083% IN NEBU: 2.5000 mg | INHALATION_SOLUTION | RESPIRATORY_TRACT | Status: DC | PRN

## 2021-10-09 MED ORDER — IPRATROPIUM-ALBUTEROL 0.5-2.5 (3) MG/3ML IN SOLN
3.0000 mL | Freq: Four times a day (QID) | RESPIRATORY_TRACT | Status: DC
  Administered 2021-10-09 – 2021-10-10 (×3): 3 mL via RESPIRATORY_TRACT
  Filled 2021-10-09 (×4): qty 3

## 2021-10-09 MED ORDER — DEXMEDETOMIDINE HCL IN NACL 400 MCG/100ML IV SOLN
0.0000 ug/kg/h | INTRAVENOUS | Status: DC
  Filled 2021-10-09: qty 100

## 2021-10-09 MED ORDER — ORAL CARE MOUTH RINSE
15.0000 mL | OROMUCOSAL | Status: DC
  Administered 2021-10-09 (×3): 15 mL via OROMUCOSAL

## 2021-10-09 MED ORDER — FENTANYL 2500MCG IN NS 250ML (10MCG/ML) PREMIX INFUSION: 25.0000 ug/h | INTRAVENOUS | Status: DC

## 2021-10-09 MED ORDER — FUROSEMIDE 10 MG/ML IJ SOLN
40.0000 mg | Freq: Once | INTRAMUSCULAR | Status: AC
  Administered 2021-10-09: 04:00:00 40 mg via INTRAVENOUS
  Filled 2021-10-09: qty 4

## 2021-10-09 MED ORDER — PANTOPRAZOLE 2 MG/ML SUSPENSION: 40.0000 mg | Freq: Every day | ORAL | Status: DC

## 2021-10-09 MED ORDER — PNEUMOCOCCAL VAC POLYVALENT 25 MCG/0.5ML IJ INJ
0.5000 mL | INJECTION | INTRAMUSCULAR | Status: DC
  Filled 2021-10-09: qty 0.5

## 2021-10-09 MED ORDER — EMPTY CONTAINERS FLEXIBLE MISC
0.5000 mg/min | Status: DC
Start: 2021-10-09 — End: 2021-10-09

## 2021-10-09 MED ORDER — FENTANYL CITRATE PF 50 MCG/ML IJ SOSY
PREFILLED_SYRINGE | INTRAMUSCULAR | Status: AC
  Filled 2021-10-09: qty 2

## 2021-10-09 MED ORDER — METHYLPREDNISOLONE SODIUM SUCC 40 MG IJ SOLR
40.0000 mg | INTRAMUSCULAR | Status: DC
  Administered 2021-10-10: 07:00:00 40 mg via INTRAVENOUS
  Filled 2021-10-09: qty 1

## 2021-10-09 MED ORDER — NICOTINE 14 MG/24HR TD PT24
14.0000 mg | MEDICATED_PATCH | Freq: Every day | TRANSDERMAL | Status: DC
  Administered 2021-10-09: 04:00:00 14 mg via TRANSDERMAL
  Filled 2021-10-09 (×3): qty 1

## 2021-10-09 MED ORDER — INSULIN ASPART 100 UNIT/ML IJ SOLN
0.0000 [IU] | INTRAMUSCULAR | Status: DC
  Administered 2021-10-09 (×2): 1 [IU] via SUBCUTANEOUS
  Administered 2021-10-09: 05:00:00 2 [IU] via SUBCUTANEOUS
  Administered 2021-10-09: 16:00:00 3 [IU] via SUBCUTANEOUS
  Administered 2021-10-09: 1 [IU] via SUBCUTANEOUS
  Administered 2021-10-09 – 2021-10-10 (×2): 2 [IU] via SUBCUTANEOUS
  Administered 2021-10-10: 21:00:00 1 [IU] via SUBCUTANEOUS
  Administered 2021-10-11: 13:00:00 2 [IU] via SUBCUTANEOUS
  Administered 2021-10-12: 11:00:00 1 [IU] via SUBCUTANEOUS

## 2021-10-09 NOTE — Procedures (Signed)
Extubation Procedure Note  Patient Details:   Name: Phillip Kennedy DOB: [birth year] MRN: [id number]   Airway Documentation:    Vent end date: 10/09/21 Vent end time: 0910   Evaluation  O2 sats: stable throughout Complications: No apparent complications Patient did tolerate procedure well. Bilateral Breath Sounds: Clear, Diminished   Yes  Richmond Campbell 10/09/2021, 11:24 AM

## 2021-10-09 NOTE — Progress Notes (Signed)
NAME:  Meredith Mells, MRN:  [id number], DOB:  05/28/1956, LOS: 0 ADMISSION DATE:  10/08/2021, CONSULTATION DATE:  12/19 REFERRING MD:  Dr. Blinda Leatherwood, CHIEF COMPLAINT:  Hypertensive emergency; acute respiratory failure  History of Present Illness:  Patient is a male; name and age unknown.  Patient reports PMH of CHF, HTN, and COPD.  Admitted to [facility] ED on 12/18 with SOB.  On 12/18 patient attempted to drive himself to hospital for shortness of breath.  Had to stop a gas station and called EMS.  Patient transported on CPAP to [facility] ED.  Upon arrival to ED, patient sats were 79% on CPAP.  Patient was combative and became obtunded.  Patient intubated in ED.  Patient BP 231/175. Afebrile. Started on nitro drip IV.  CT head pending.  CXR with mild parenchymal edema.  COVID/flu negative.  ABG 7.18, 67, 417, 25.  Glucose 225, creatinine 1.8, WBC 5, hemoglobin 13.  Troponin 48.  Ethanol normal.  UA, UDS, BNP, lactic acid pending. Patient given 40 lasix and given breathing treatments. Given steroids.  PCCM consulted for ICU admission and medical management.  Pertinent  Medical History  Patient reports history of CHF and COPD No other history on file  Significant Hospital Events: Including procedures, antibiotic start and stop dates in addition to other pertinent events   12/19: Admitted to [facility] with hypertensive emergency and acute respiratory failure.  Patient intubated. CTH negative. ETOH negative, passed spontaneous breathing trial in the morning so extubated  Interim History / Subjective:  Weaning   Objective   Blood pressure 101/75, pulse 73, temperature 99 F (37.2 C), resp. rate 15, height 5\' 10"  (1.778 m), weight 90 kg, SpO2 (Pended) 100 %.    Vent Mode: PRVC FiO2 (%):  [40 %-100 %] (P) 40 % Set Rate:  [22 bmp-24 bmp] 24 bmp Vt Set:  [550 mL] 550 mL PEEP:  [5 cmH20] 5 cmH20 Plateau Pressure:  [21 cmH20] 21 cmH20   Intake/Output Summary (Last 24 hours) at 10/09/2021 0816 Last data  filed at 10/09/2021 0700 Gross per 24 hour  Intake 111.78 ml  Output 300 ml  Net -188.22 ml   Filed Weights   10/08/21 2352 10/09/21 0459  Weight: 99.8 kg 90 kg    Examination: General: 65 year old male patient currently on pressure support ventilation with excellent tidal volume and no accessory use HEENT normocephalic atraumatic no jugular venous distention appreciated orally intubated Pulmonary: Clear to auscultation, excellent tidal volumes on pressure support exceeding 1 L PCXR personally reviewed: ETT acceptable. NGT looping back towards distal esophagus. Improving R>L airspace disease.  Cardiac: Regular rate and rhythm Abdomen soft not tender Extremities warm dry Neuro intact  Resolved Hospital Problem list     Assessment & Plan:  Acute respiratory failure with hypoxia and hypercarbia 2/2 acute decompensated HF/Pulmonary Edema  Possible hx of COPD and CHF -aeration on CXR improved. -weaning, PCT neg.  No leukocytosis.  Plan Extubate Waen oxygen  PS  Lasix  Am cxr  F/u echo Dec steroids to 40mg /d Cont BDs/ICS  Hypertensive emergency w/ associated flash Pulmonary edema  Hx of HTN -Patient normotensive since stopping propofol and nitro drip Plan Cont PRN hydralazine Needs med reconciliation  IV lasix   Troponin elevation Plan Repeat  F/u echo  Acute encephalopathy: Likely due to hypoxia and hypercarbia -CTH neg  Plan Supportive care PAD protocol. RASS goal 0   AKI -appears to be improving  Plan Cont gentle hydration  Avoid nephrotoxins Avoid hypotension MAP goal >  65 Am chemistry   Hyperglycemia Currently w/ good glycemic control; but A1C suggests sub-optimal Outpt control  Plan Cont current sensitive SSI  Tobacco abuse: Wife states current smoker Plan Cont nicotine patch  Smoking cessation when able   Best Practice (right click and "Reselect all SmartList Selections" daily)   Diet/type: NPO w/ meds via tube DVT prophylaxis:  prophylactic heparin  GI prophylaxis: PPI Lines: N/A Foley:  N/A Code Status:  full code Last date of multidisciplinary goals of care discussion [12/19 patient's wife and son were updated at bedside; wife was not able to give a good history of what happened and only knows that her husband has HTN and is a smoker.  She believes that he has a history of COPD and CHF as well but is unsure.  She does not know any medications that he is taking.   Critical care time:  32 min      Simonne Martinet ACNP-BC [facility] Pulmonary/Critical Care Pager # [phone] OR # [phone] if no answer

## 2021-10-09 NOTE — ED Notes (Signed)
Plan to switch from propofol to fentanyl drip for sedation due to pt hypotension (69/60)

## 2021-10-09 NOTE — TOC Initial Note (Signed)
Transition of Care [facility]) - Initial/Assessment Note    Patient Details  Name: Phillip Kennedy MRN: [id number] Date of Birth: 11/09/1955  Transition of Care [facility]) CM/SW Contact:    Tom-Johnson, Hershal Coria, RN Phone Number: 10/09/2021, 3:07 PM  Clinical Narrative:                  Transition of Care [facility]) Screening Note   Patient Details  Name: Phillip Kennedy Date of Birth: 11-12-1955   Transition of Care [facility]) CM/SW Contact:    Tom-Johnson, Hershal Coria, RN Phone Number: 10/09/2021, 3:07 PM  Patient from home with wife. Presented to the ED with shortness of breath. Admitted for Acute Respiratory Failure and hypertensive emergency. Intubated on admission and extubated today. No recommendations noted at this time. Transition of Care Department [facility]) has reviewed patient and no TOC needs have been identified at this time. We will continue to monitor patient advancement through interdisciplinary progression rounds. If new patient transition needs arise, please place a TOC consult.      Barriers to Discharge: Continued Medical Work up   Patient Goals and CMS Choice Patient states their goals for this hospitalization and ongoing recovery are:: To go home CMS Medicare.gov Compare Post Acute Care list provided to:: Patient    Expected Discharge Plan and Services     Discharge Planning Services: CM Consult   Living arrangements for the past 2 months: Single Family Home                                      Prior Living Arrangements/Services Living arrangements for the past 2 months: Single Family Home Lives with:: Spouse Patient language and need for interpreter reviewed:: Yes Do you feel safe going back to the place where you live?: Yes      Need for Family Participation in Patient Care: Yes (Comment)     Criminal Activity/Legal Involvement Pertinent to Current Situation/Hospitalization: No - Comment as needed  Activities of Daily Living Home  Assistive Devices/Equipment: Walker (specify type), Cane (specify quad or straight) (front wheel; straight) ADL Screening (condition at time of admission) Patient's cognitive ability adequate to safely complete daily activities?: Yes Is the patient deaf or have difficulty hearing?: No Does the patient have difficulty seeing, even when wearing glasses/contacts?: No Does the patient have difficulty concentrating, remembering, or making decisions?: No Patient able to express need for assistance with ADLs?: Yes Does the patient have difficulty dressing or bathing?: Yes Independently performs ADLs?: Yes (appropriate for developmental age) Does the patient have difficulty walking or climbing stairs?: No Weakness of Legs: None Weakness of Arms/Hands: None  Permission Sought/Granted Permission sought to share information with : Case Manager, Family Supports Permission granted to share information with : Yes, Verbal Permission Granted              Emotional Assessment Appearance:: Appears stated age Attitude/Demeanor/Rapport: Engaged Affect (typically observed): Appropriate, Calm Orientation: : Oriented to Self, Oriented to Place, Oriented to Situation Alcohol / Substance Use: Not Applicable Psych Involvement: No (comment)  Admission diagnosis:  Respiratory distress [R06.03] Acute respiratory failure with hypoxia (HCC) [J96.01] Hypertensive emergency [I16.1] Patient Active Problem List   Diagnosis Date Noted   Acute respiratory failure with hypoxia (HCC) 10/09/2021   Hypertensive emergency    AKI (acute kidney injury) (HCC)    Hyperglycemia    Pulmonary edema    PCP:  Orpah Cobb, MD  Pharmacy:   [facility] [phone] - [place], [place] - [address] AT [facility] [address] [place] [address] Phone: [phone] Fax: [phone]     Social Determinants of Health (SDOH) Interventions    Readmission Risk Interventions No  flowsheet data found.

## 2021-10-09 NOTE — Progress Notes (Signed)
°  Echocardiogram 2D Echocardiogram has been performed.  Phillip Kennedy 10/09/2021, 11:13 AM

## 2021-10-09 NOTE — Progress Notes (Signed)
eLink Physician-Brief Progress Note Patient Name: Phillip Kennedy DOB: 11/17/1955 MRN: [id number]   Date of Service  10/09/2021  HPI/Events of Note  41 M history of hypertension, HF and COPD presents with 1 day history of shortness of breath. He was trying to drive himself to the hospital but had a pull over at a gas station where he called EMS. He was placed on CPAP but sats remained in the 70s. He became combative then obtunded and was eventually intubated. BP was 231.175 on presentation and was started on NTG drip. Became hypotensive while on propofol and sedation shifted to Precedex and Fentanyl and NTG stopped. BP now 128/99  HR 80. Head CT negative for acute process.  eICU Interventions  Managed as hypertensive emergency, HF and COPD exacerbation. UDS pending. Trend troponins. Off antibiotics awaiting procal.     Intervention Category Evaluation Type: New Patient Evaluation  [name] 10/09/2021, 2:35 AM

## 2021-10-09 NOTE — H&P (Addendum)
NAME:  Phillip Kennedy, MRN:  [id number], DOB:  10/22/1875, LOS: 0 ADMISSION DATE:  10/08/2021, CONSULTATION DATE:  12/19 REFERRING MD:  Dr. Blinda Leatherwood, CHIEF COMPLAINT:  Hypertensive emergency; acute respiratory failure  History of Present Illness:  Patient is a male; name and age unknown.  Patient reports PMH of CHF, HTN, and COPD.  Admitted to [facility] ED on 12/18 with SOB.  On 12/18 patient attempted to drive himself to hospital for shortness of breath.  Had to stop a gas station and called EMS.  Patient transported on CPAP to [facility] ED.  Upon arrival to ED, patient sats were 79% on CPAP.  Patient was combative and became obtunded.  Patient intubated in ED.  Patient BP 231/175. Afebrile. Started on nitro drip IV.  CT head pending.  CXR with mild parenchymal edema.  COVID/flu negative.  ABG 7.18, 67, 417, 25.  Glucose 225, creatinine 1.8, WBC 5, hemoglobin 13.  Troponin 48.  Ethanol normal.  UA, UDS, BNP, lactic acid pending. Patient given 40 lasix and given breathing treatments. Given steroids.  PCCM consulted for ICU admission and medical management.  Pertinent  Medical History  Patient reports history of CHF and COPD No other history on file  Significant Hospital Events: Including procedures, antibiotic start and stop dates in addition to other pertinent events   12/19: Admitted to [facility] with hypertensive emergency and acute respiratory failure.  Patient intubated  Interim History / Subjective:  Patient intubated.   Patient became hypotensive after starting propofol drip; propofol and nitro drip held SBP now 130s Patient on fentanyl drip. Patient coughing against ET tube; patient is able to follow some commands; PERRL; pupils 2 mm bilaterally  Objective   Blood pressure (!) 128/93, pulse 81, temperature 98.5 F (36.9 C), temperature source Oral, resp. rate (!) 24, height 5\' 10"  (1.778 m), weight 99.8 kg, SpO2 100 %.    Vent Mode: PRVC FiO2 (%):  [70 %-100 %] 70 % Set Rate:  [22 bmp-24  bmp] 24 bmp Vt Set:  [550 mL] 550 mL PEEP:  [5 cmH20] 5 cmH20 Plateau Pressure:  [21 cmH20] 21 cmH20  No intake or output data in the 24 hours ending 10/09/21 0100 Filed Weights   10/08/21 2352  Weight: 99.8 kg    Examination: General:  critically ill appearing on mech vent HEENT: MM pink/moist; ETT in place Neuro: patient is able to follow some commands; PERRL; pupils 2 mm bilaterally; cough/gag reflex intact CV: s1s2, no m/r/g PULM:  coarse rhonchi wheezing BS bilaterally; on mech vent PRVC GI: soft, bsx4 active  GU: foley in place with clear/yellow urine Extremities: warm/dry, no edema  Skin: no rashes or lesions appreciated   Resolved Hospital Problem list     Assessment & Plan:  Acute respiratory failure with hypoxia and hypercarbia Possible hx of COPD and CHF P: -Continue mechanical ventilation PRVC 6 to 8 cc/kg -RR increased due to previous ABG -Repeat ABG in 2 hours -VAP prevention in place -Wean FiO2 for sats greater than 90% -Continue DuoNeb every 4 and Pulmicort twice daily -Continue IV steroids -We will give mag -We will give Lasix 40 IV  -no echo in records; will check echo -bnp pending -Strict strict I's and O's -Daily weights -Precedex and fentanyl for RASS 0 to -1 -Trend troponin and lactate -Check procalcitonin; consider starting antibiotics if elevated and checking cultures -CXR in a.m.  Hypertensive emergency Hx of HTN P: -Patient normotensive since stopping propofol and nitro drip -prn hydralazine -CT head  pending -consider starting HTN meds in am  Acute encephalopathy: Likely due to hypoxia and hypercarbia P: -CT head pending -Ammonia, UDS pending -Limit sedating medications -Frequent neurochecks  AKI P: -Trend BMP / urinary output -Replace electrolytes as indicated -Avoid nephrotoxic agents, ensure adequate renal perfusion  Hyperglycemia P: -SSI and cbg monitoring -check a1c  Tobacco abuse: Wife states current  smoker P: -nicotine patch -We will need smoking cessation counseling  Best Practice (right click and "Reselect all SmartList Selections" daily)   Diet/type: NPO w/ meds via tube DVT prophylaxis: prophylactic heparin  GI prophylaxis: PPI Lines: N/A Foley:  N/A Code Status:  full code Last date of multidisciplinary goals of care discussion [12/19 patient's wife and son were updated at bedside; wife was not able to give a good history of what happened and only knows that her husband has HTN and is a smoker.  She believes that he has a history of COPD and CHF as well but is unsure.  She does not know any medications that he is taking.]  Labs   CBC: Recent Labs  Lab 10/08/21 2300 10/08/21 2359 10/09/21 0037  WBC 5.0  --   --   HGB 13.3 13.3 15.3  HCT 43.0 39.0 45.0  MCV 86.9  --   --   PLT 194  --   --     Basic Metabolic Panel: Recent Labs  Lab 10/08/21 2359 10/09/21 0037  NA 137 139  K 4.2 4.4  CL  --  106  GLUCOSE  --  222*  BUN  --  29*  CREATININE  --  1.70*   GFR: Estimated Creatinine Clearance: -3.4 mL/min (A) (by C-G formula based on SCr of 1.7 mg/dL (H)). Recent Labs  Lab 10/08/21 2300  WBC 5.0    Liver Function Tests: No results for input(s): AST, ALT, ALKPHOS, BILITOT, PROT, ALBUMIN in the last 168 hours. No results for input(s): LIPASE, AMYLASE in the last 168 hours. No results for input(s): AMMONIA in the last 168 hours.  ABG    Component Value Date/Time   PHART 7.182 (LL) 10/08/2021 2359   PCO2ART 67.1 (HH) 10/08/2021 2359   PO2ART 417 (H) 10/08/2021 2359   HCO3 25.2 10/08/2021 2359   TCO2 25 10/09/2021 0037   ACIDBASEDEF 4.0 (H) 10/08/2021 2359   O2SAT 100.0 10/08/2021 2359     Coagulation Profile: No results for input(s): INR, PROTIME in the last 168 hours.  Cardiac Enzymes: No results for input(s): CKTOTAL, CKMB, CKMBINDEX, TROPONINI in the last 168 hours.  HbA1C: No results found for: HGBA1C  CBG: No results for input(s): GLUCAP  in the last 168 hours.  Review of Systems:   Unable to obtain.  Patient intubated.  Obtain from chart review and bedside nurse.  Past Medical History:  He,  has no past medical history on file.   Surgical History:  History reviewed. No pertinent surgical history.   Social History:      Family History:  His family history is not on file.   Allergies Not on File   Home Medications  Prior to Admission medications   Not on File     Critical care time: 46 minutes     JD Anselm Lis [place] [facility] 10/09/2021, 1:00 AM  Please see [email or web address] for pager details.  From 7A-7P if no response, please call [phone]. After hours, please call ELink [phone].

## 2021-10-10 ENCOUNTER — Inpatient Hospital Stay (HOSPITAL_COMMUNITY): Payer: Medicare Other

## 2021-10-10 LAB — BASIC METABOLIC PANEL
Anion gap: 7 (ref 5–15)
BUN: 37 mg/dL — ABNORMAL HIGH (ref 8–23)
CO2: 22 mmol/L (ref 22–32)
Calcium: 8.6 mg/dL — ABNORMAL LOW (ref 8.9–10.3)
Chloride: 109 mmol/L (ref 98–111)
Creatinine, Ser: 1.85 mg/dL — ABNORMAL HIGH (ref 0.61–1.24)
GFR, Estimated: 40 mL/min — ABNORMAL LOW (ref >60)
Glucose, Bld: 82 mg/dL (ref 70–99)
Potassium: 4 mmol/L (ref 3.5–5.1)
Sodium: 138 mmol/L (ref 135–145)

## 2021-10-10 LAB — CBC
HCT: 32.8 % — ABNORMAL LOW (ref 39.0–52.0)
Hemoglobin: 10.4 g/dL — ABNORMAL LOW (ref 13.0–17.0)
MCH: 26.4 pg (ref 26.0–34.0)
MCHC: 31.7 g/dL (ref 30.0–36.0)
MCV: 83.2 fL (ref 80.0–100.0)
Platelets: 135 10*3/uL — ABNORMAL LOW (ref 150–400)
RBC: 3.94 MIL/uL — ABNORMAL LOW (ref 4.22–5.81)
RDW: 14.3 % (ref 11.5–15.5)
WBC: 8.4 10*3/uL (ref 4.0–10.5)
nRBC: 0 % (ref 0.0–0.2)

## 2021-10-10 LAB — GLUCOSE, CAPILLARY
Glucose-Capillary: 110 mg/dL — ABNORMAL HIGH (ref 70–99)
Glucose-Capillary: 140 mg/dL — ABNORMAL HIGH (ref 70–99)
Glucose-Capillary: 148 mg/dL — ABNORMAL HIGH (ref 70–99)
Glucose-Capillary: 177 mg/dL — ABNORMAL HIGH (ref 70–99)
Glucose-Capillary: 80 mg/dL (ref 70–99)
Glucose-Capillary: 95 mg/dL (ref 70–99)

## 2021-10-10 MED ORDER — AMLODIPINE BESYLATE 5 MG PO TABS
5.0000 mg | ORAL_TABLET | Freq: Every day | ORAL | Status: DC
  Administered 2021-10-10 – 2021-10-11 (×2): 5 mg via ORAL
  Filled 2021-10-10 (×2): qty 1

## 2021-10-10 MED ORDER — COVID-19MRNA BIVAL VAC MODERNA 50 MCG/0.5ML IM SUSP
0.5000 mL | Freq: Once | INTRAMUSCULAR | Status: DC
  Filled 2021-10-10: qty 0.5

## 2021-10-10 MED ORDER — IPRATROPIUM-ALBUTEROL 0.5-2.5 (3) MG/3ML IN SOLN
3.0000 mL | Freq: Two times a day (BID) | RESPIRATORY_TRACT | Status: DC
  Administered 2021-10-10 – 2021-10-11 (×2): 3 mL via RESPIRATORY_TRACT
  Filled 2021-10-10 (×2): qty 3

## 2021-10-10 MED ORDER — CLONIDINE HCL 0.1 MG PO TABS
0.2000 mg | ORAL_TABLET | Freq: Every day | ORAL | Status: DC
  Administered 2021-10-10 – 2021-10-11 (×2): 0.2 mg via ORAL
  Filled 2021-10-10 (×2): qty 2

## 2021-10-10 MED ORDER — PREDNISONE 20 MG PO TABS
40.0000 mg | ORAL_TABLET | Freq: Every day | ORAL | Status: DC
  Administered 2021-10-11 – 2021-10-12 (×2): 40 mg via ORAL
  Filled 2021-10-10 (×2): qty 2

## 2021-10-10 MED ORDER — METOPROLOL TARTRATE 25 MG PO TABS
25.0000 mg | ORAL_TABLET | Freq: Two times a day (BID) | ORAL | Status: DC
  Administered 2021-10-10 – 2021-10-12 (×5): 25 mg via ORAL
  Filled 2021-10-10 (×5): qty 1

## 2021-10-10 NOTE — Plan of Care (Signed)

## 2021-10-10 NOTE — Progress Notes (Signed)
PROGRESS NOTE    Phillip Kennedy  [id number] DOB: 10-08-1956 DOA: 10/08/2021 PCP: [name], MD    No chief complaint on file.   Brief Narrative:  Patient is a male; name and age unknown.  Patient reports PMH of CHF, HTN, and COPD.  Admitted to [facility] ED on 12/18 with SOB. On 12/18 patient attempted to drive himself to hospital for shortness of breath.  Had to stop a gas station and called EMS.  Patient transported on CPAP to [facility] ED.  Upon arrival to ED, patient sats were 79% on CPAP.  Patient was combative and became obtunded.  Patient intubated in ED and admitted to [facility] in ICU.   Assessment & Plan:   Principal Problem:   Acute respiratory failure with hypoxia (HCC) Active Problems:   Hypertensive emergency   AKI (acute kidney injury) ([employer])   Hyperglycemia   Pulmonary edema   Acute hypoxemic respiratory failure secondary to community acquired pneumonia and hypertensive emergency.  S/p intubation and extubated on 10/09/2021.  Currently he is on RA with good sats. Plan for a quick taper of steroids. No wheezing heard on exam.  Echocardiogram showed Left ventricular ejection fraction is 45 to 50%,  mildly decreased function,  global hypokinesis. The left ventricular internal cavity size was moderately dilated. There is mild  left ventricular hypertrophy. Left ventricular diastolic parameters are consistent with Grade I diastolic dysfunction (impaired relaxation).   CAP:  - complete the course of antibiotics.   Acute metabolic encephalopathy secondary to hypoxia and hypercarbia.  Resolved.   Tobacco abuse:  - on nicotine patch.    Hypertensive emergency:  Improved.  Restarted home meds, BP still elevated, .  Currently on norvasc 5 mg daily, hydralazine 50 mg TID, IMDUR 120 mg daily, and metoprolol 25 mg BID.  Recommend outpatient follow up with Dr Doylene Canard in the office.   Pulmonary edema:  Much improved. CXR showed improved aeration.    AKI;  ? From  hypertensive emergency and fluid overload.  - creatinine improved from 2.1 to 1.8.   Mild anemia and mild thrombocytopenia:  Continue to monitor.    DVT prophylaxis: Heparin Code Status: full code Family Communication: None at bedside.  Disposition:   Status is: Inpatient  Remains inpatient appropriate because: IV antibiotics        Consultants:  PCCM.   Procedures: S/p extubation on 10/09/21/  Antimicrobials:  Antibiotics Given (last 72 hours)     Date/Time Action Medication Dose Rate   10/09/21 1101 New Bag/Given   cefTRIAXone (ROCEPHIN) 1 g in sodium chloride 0.9 % 100 mL IVPB 1 g 200 mL/hr   10/09/21 1215 New Bag/Given  [sent late from pharmacy]   azithromycin (ZITHROMAX) 500 mg in sodium chloride 0.9 % 250 mL IVPB 500 mg 250 mL/hr   10/10/21 1035 New Bag/Given   cefTRIAXone (ROCEPHIN) 1 g in sodium chloride 0.9 % 100 mL IVPB 1 g 200 mL/hr   10/10/21 1248 New Bag/Given   azithromycin (ZITHROMAX) 500 mg in sodium chloride 0.9 % 250 mL IVPB 500 mg 250 mL/hr        Subjective: Wants to know when she can go home.   Objective: Vitals:   10/10/21 1100 10/10/21 1120 10/10/21 1200 10/10/21 1300  BP: (!) 169/101 (!) 169/101 (!) 164/110 (!) 163/100  Pulse: 90 85 95 73  Resp: (!) 25 20 20 19   Temp:  98.6 F (37 C)    TempSrc:  Oral    SpO2: 99% 100% 97%  100%  Weight:      Height:        Intake/Output Summary (Last 24 hours) at 10/10/2021 1342 Last data filed at 10/10/2021 1200 Gross per 24 hour  Intake 1187.82 ml  Output 1024.5 ml  Net 163.32 ml   Filed Weights   10/08/21 2352 10/09/21 0459 10/10/21 0657  Weight: 99.8 kg 90 kg 87.5 kg    Examination:  General exam: Appears calm and comfortable  Respiratory system: Clear to auscultation. Respiratory effort normal. Cardiovascular system: S1 & S2 heard, RRR. No JVD,  No pedal edema. Gastrointestinal system: Abdomen is nondistended, soft and nontender.  Normal bowel sounds heard. Central nervous  system: Alert and oriented. No focal neurological deficits. Extremities: Symmetric 5 x 5 power. Skin: No rashes, lesions or ulcers Psychiatry: Mood & affect appropriate.     Data Reviewed: I have personally reviewed following labs and imaging studies  CBC: Recent Labs  Lab 10/08/21 2300 10/08/21 2359 10/09/21 0037 10/09/21 0316 10/09/21 0604 10/10/21 0122  WBC 5.0  --   --   --  5.7 8.4  HGB 13.3 13.3 15.3 11.6* 11.1* 10.4*  HCT 43.0 39.0 45.0 34.0* 34.7* 32.8*  MCV 86.9  --   --   --  84.4 83.2  PLT 194  --   --   --  146* 135*    Basic Metabolic Panel: Recent Labs  Lab 10/08/21 2300 10/08/21 2359 10/09/21 0037 10/09/21 0316 10/09/21 0604 10/10/21 0122  NA 135 137 139 139 136 138  K 4.4 4.2 4.4 3.9 3.9 4.0  CL 105  --  106  --  107 109  CO2 23  --   --   --  21* 22  GLUCOSE 225*  --  222*  --  162* 82  BUN 24*  --  29*  --  28* 37*  CREATININE 1.80*  --  1.70*  --  2.13* 1.85*  CALCIUM 8.9  --   --   --  8.5* 8.6*  MG  --   --   --   --  2.6*  --     GFR: Estimated Creatinine Clearance: 41.1 mL/min (A) (by C-G formula based on SCr of 1.85 mg/dL (H)).  Liver Function Tests: Recent Labs  Lab 10/08/21 2300  AST 49*  ALT 41  ALKPHOS 85  BILITOT 0.7  PROT 7.2  ALBUMIN 3.7    CBG: Recent Labs  Lab 10/09/21 1925 10/09/21 2334 10/10/21 0315 10/10/21 0822 10/10/21 1122  GLUCAP 151* 130* 95 148* 177*     Recent Results (from the past 240 hour(s))  Resp Panel by RT-PCR (Flu A&B, Covid) Nasopharyngeal Swab     Status: None   Collection Time: 10/08/21 11:02 PM   Specimen: Nasopharyngeal Swab; Nasopharyngeal(NP) swabs in vial transport medium  Result Value Ref Range Status   SARS Coronavirus 2 by RT PCR NEGATIVE NEGATIVE Final    Comment: (NOTE) SARS-CoV-2 target nucleic acids are NOT DETECTED.  The SARS-CoV-2 RNA is generally detectable in upper respiratory specimens during the acute phase of infection. The lowest concentration of SARS-CoV-2  viral copies this assay can detect is 138 copies/mL. A negative result does not preclude SARS-Cov-2 infection and should not be used as the sole basis for treatment or other patient management decisions. A negative result may occur with  improper specimen collection/handling, submission of specimen other than nasopharyngeal swab, presence of viral mutation(s) within the areas targeted by this assay, and inadequate number of viral copies(<138  copies/mL). A negative result must be combined with clinical observations, patient history, and epidemiological information. The expected result is Negative.  Fact Sheet for Patients:  EntrepreneurPulse.com.au  Fact Sheet for Healthcare Providers:  IncredibleEmployment.be  This test is no t yet approved or cleared by the [place] FDA and  has been authorized for detection and/or diagnosis of SARS-CoV-2 by FDA under an Emergency Use Authorization (EUA). This EUA will remain  in effect (meaning this test can be used) for the duration of the COVID-19 declaration under Section 564(b)(1) of the Act, 21 U.S.C.section 360bbb-3(b)(1), unless the authorization is terminated  or revoked sooner.       Influenza A by PCR NEGATIVE NEGATIVE Final   Influenza B by PCR NEGATIVE NEGATIVE Final    Comment: (NOTE) The Xpert Xpress SARS-CoV-2/FLU/RSV plus assay is intended as an aid in the diagnosis of influenza from Nasopharyngeal swab specimens and should not be used as a sole basis for treatment. Nasal washings and aspirates are unacceptable for Xpert Xpress SARS-CoV-2/FLU/RSV testing.  Fact Sheet for Patients: EntrepreneurPulse.com.au  Fact Sheet for Healthcare Providers: IncredibleEmployment.be  This test is not yet approved or cleared by the [place] FDA and has been authorized for detection and/or diagnosis of SARS-CoV-2 by FDA under an Emergency Use Authorization  (EUA). This EUA will remain in effect (meaning this test can be used) for the duration of the COVID-19 declaration under Section 564(b)(1) of the Act, 21 U.S.C. section 360bbb-3(b)(1), unless the authorization is terminated or revoked.  Performed at [facility], [place] [address] [place], [place] [address]   MRSA Next Gen by PCR, Nasal     Status: None   Collection Time: 10/09/21  2:30 AM   Specimen: Nasal Mucosa; Nasal Swab  Result Value Ref Range Status   MRSA by PCR Next Gen NOT DETECTED NOT DETECTED Final    Comment: (NOTE) The GeneXpert MRSA Assay (FDA approved for NASAL specimens only), is one component of a comprehensive MRSA colonization surveillance program. It is not intended to diagnose MRSA infection nor to guide or monitor treatment for MRSA infections. Test performance is not FDA approved in patients less than 23 years old. Performed at [facility], [place] [address], [place], [place] [address]          Radiology Studies: DG Abdomen 1 View  Result Date: 10/08/2021 CLINICAL DATA:  NG placement confirmation. EXAM: ABDOMEN - 1 VIEW COMPARISON:  None. FINDINGS: The bowel gas pattern is normal. No radio-opaque calculi or other significant radiographic abnormality are seen. Enteric tube courses around the proximal gastric curvature with the tip in the body of stomach. There is no supine evidence of free air. IMPRESSION: Enteric tube within the stomach with the tip in the body of stomach. No acute radiographic findings. Electronically Signed   By: Telford Nab M.D.   On: 10/08/2021 23:30   CT HEAD WO CONTRAST (5MM)  Result Date: 10/09/2021 CLINICAL DATA:  Altered mental status EXAM: CT HEAD WITHOUT CONTRAST TECHNIQUE: Contiguous axial images were obtained from the base of the skull through the vertex without intravenous contrast. COMPARISON:  None. FINDINGS: Brain: Normal anatomic configuration. Mild periventricular white matter changes are present likely  reflecting the sequela of small vessel ischemia. No abnormal intra or extra-axial mass lesion or fluid collection. No abnormal mass effect or midline shift. No evidence of acute intracranial hemorrhage or infarct. Ventricular size is normal. Cerebellum unremarkable. Vascular: No asymmetric hyperdense vasculature at the skull base. Skull: Intact Sinuses/Orbits:  There is mucosal thickening involving the ethmoid air cells bilaterally as well as layering fluid within the maxillary sinuses and, to a lesser extent, sphenoid sinuses bilaterally. Small mucous retention cyst noted within the left sphenoid sinus. Remote left medial orbital wall fracture. Orbits are unremarkable. Other: Mastoid air cells and middle ear cavities are clear. IMPRESSION: No acute intracranial abnormality.  Mild senescent change. Moderate paranasal sinus disease. Air-fluid levels can be seen the setting of acute sinusitis. Electronically Signed   By: Fidela Salisbury M.D.   On: 10/09/2021 02:11   DG Chest Port 1 View  Result Date: 10/10/2021 CLINICAL DATA:  Pulmonary edema EXAM: PORTABLE CHEST 1 VIEW COMPARISON:  10/09/2021 FINDINGS: Endotracheal and enteric tubes are no longer present. There is overall improved lung aeration with persistent atelectasis/consolidation at the right lung base. No pleural effusion or pneumothorax. Similar cardiomegaly. IMPRESSION: Overall improved lung aeration with persistent atelectasis/consolidation at the right lung base. Electronically Signed   By: Macy Mis M.D.   On: 10/10/2021 08:49   DG Chest Port 1 View  Result Date: 10/09/2021 CLINICAL DATA:  Respiratory failure with hypoxia EXAM: PORTABLE CHEST 1 VIEW COMPARISON:  10/08/2021 FINDINGS: Endotracheal tube terminates 7.3 cm above carina. Nasogastric tube is looped in the stomach with tip directed superiorly, likely in the esophagus. Mild cardiomegaly. No pleural effusion or pneumothorax. Interstitial edema is improved, mild. Left base airspace  disease is new or increased. IMPRESSION: Nasogastric tube likely looped in the stomach with tip directed superiorly in the distal esophagus. Correlate with abdominal radiographs. Improvement in mild interstitial edema. Developing left base Airspace disease, likely atelectasis. These results will be called to the ordering clinician or representative by the Radiologist Assistant, and communication documented in the PACS or [employer]. Electronically Signed   By: Abigail Miyamoto M.D.   On: 10/09/2021 08:15   DG Chest Port 1 View  Result Date: 10/08/2021 CLINICAL DATA:  Shortness of breath EXAM: PORTABLE CHEST 1 VIEW COMPARISON:  01/17/2018 FINDINGS: Cardiac shadow is within normal limits. Tortuous thoracic aorta is noted. Endotracheal tube is noted in satisfactory position. Gastric catheter extends towards the stomach. Vascular congestion and mild parenchymal edema is noted. No bony abnormality is seen. IMPRESSION: Changes of mild CHF. Tubes and lines in satisfactory position. Electronically Signed   By: Inez Catalina M.D.   On: 10/08/2021 23:28   ECHOCARDIOGRAM COMPLETE  Result Date: 10/09/2021    ECHOCARDIOGRAM REPORT   Patient Name:   Carlos LEE Balint Date of Exam: 10/09/2021 Medical Rec #:  [id number]        Height:       70.0 in Accession #:    [id number]       Weight:       198.4 lb Date of Birth:  December 18, 1955        BSA:          2.080 m Patient Age:    65 years         BP:           158/116 mmHg Patient Gender: M                HR:           68 bpm. Exam Location:  Inpatient Procedure: 2D Echo, 3D Echo, Cardiac Doppler and Color Doppler Indications:     R06.02 SOB  History:         Patient has no prior history of Echocardiogram examinations.  Signs/Symptoms:Dyspnea and Shortness of Breath; Risk                  Factors:Hypertension. Hypoxia.  Sonographer:     Roseanna Rainbow RDCS Referring Phys:  [id number] Chrystie Nose PAYNE Diagnosing Phys: Oswaldo Milian MD  Sonographer Comments: Technically  difficult study due to poor echo windows. IMPRESSIONS  1. Left ventricular ejection fraction, by estimation, is 45 to 50%. The left ventricle has mildly decreased function. The left ventricle demonstrates global hypokinesis. The left ventricular internal cavity size was moderately dilated. There is mild left ventricular hypertrophy. Left ventricular diastolic parameters are consistent with Grade I diastolic dysfunction (impaired relaxation).  2. Right ventricular systolic function is normal. The right ventricular size is normal. Tricuspid regurgitation signal is inadequate for assessing PA pressure.  3. The mitral valve is normal in structure. Trivial mitral valve regurgitation. No evidence of mitral stenosis.  4. The aortic valve was not well visualized. Aortic valve regurgitation is not visualized. Aortic valve sclerosis/calcification is present, without any evidence of aortic stenosis.  5. Aortic dilatation noted. There is mild dilatation of the aortic root, measuring 40 mm. FINDINGS  Left Ventricle: Left ventricular ejection fraction, by estimation, is 45 to 50%. The left ventricle has mildly decreased function. The left ventricle demonstrates global hypokinesis. The left ventricular internal cavity size was moderately dilated. There is mild left ventricular hypertrophy. Left ventricular diastolic parameters are consistent with Grade I diastolic dysfunction (impaired relaxation). Right Ventricle: The right ventricular size is normal. No increase in right ventricular wall thickness. Right ventricular systolic function is normal. Tricuspid regurgitation signal is inadequate for assessing PA pressure. Left Atrium: Left atrial size was normal in size. Right Atrium: Right atrial size was normal in size. Pericardium: There is no evidence of pericardial effusion. Mitral Valve: The mitral valve is normal in structure. Trivial mitral valve regurgitation. No evidence of mitral valve stenosis. Tricuspid Valve: The  tricuspid valve is normal in structure. Tricuspid valve regurgitation is trivial. Aortic Valve: The aortic valve was not well visualized. Aortic valve regurgitation is not visualized. Aortic valve sclerosis/calcification is present, without any evidence of aortic stenosis. Aortic valve mean gradient measures 7.7 mmHg. Aortic valve peak gradient measures 14.6 mmHg. Aortic valve area, by VTI measures 4.16 cm. Pulmonic Valve: The pulmonic valve was not well visualized. Pulmonic valve regurgitation is not visualized. Aorta: Aortic dilatation noted. There is mild dilatation of the aortic root, measuring 40 mm. IAS/Shunts: The interatrial septum was not well visualized.  LEFT VENTRICLE PLAX 2D LVIDd:         6.50 cm      Diastology LVIDs:         5.20 cm      LV e' medial:    5.55 cm/s LV PW:         1.20 cm      LV E/e' medial:  16.1 LV IVS:        1.20 cm      LV e' lateral:   8.22 cm/s LVOT diam:     2.60 cm      LV E/e' lateral: 10.8 LV SV:         149 LV SV Index:   72 LVOT Area:     5.31 cm                              3D Volume EF: LV Volumes (MOD)  3D EF:        48 % LV vol d, MOD A2C: 164.0 ml LV EDV:       262 ml LV vol d, MOD A4C: 163.0 ml LV ESV:       136 ml LV vol s, MOD A2C: 81.8 ml  LV SV:        126 ml LV vol s, MOD A4C: 84.5 ml LV SV MOD A2C:     82.2 ml LV SV MOD A4C:     163.0 ml LV SV MOD BP:      81.7 ml RIGHT VENTRICLE             IVC RV S prime:     12.40 cm/s  IVC diam: 2.50 cm TAPSE (M-mode): 2.8 cm LEFT ATRIUM             Index        RIGHT ATRIUM           Index LA diam:        3.00 cm 1.44 cm/m   RA Area:     19.10 cm LA Vol (A2C):   47.2 ml 22.69 ml/m  RA Volume:   51.90 ml  24.95 ml/m LA Vol (A4C):   32.8 ml 15.77 ml/m LA Biplane Vol: 39.6 ml 19.04 ml/m  AORTIC VALVE AV Area (Vmax):    3.86 cm AV Area (Vmean):   3.90 cm AV Area (VTI):     4.16 cm AV Vmax:           191.00 cm/s AV Vmean:          125.667 cm/s AV VTI:            0.359 m AV Peak Grad:      14.6 mmHg AV Mean  Grad:      7.7 mmHg LVOT Vmax:         139.00 cm/s LVOT Vmean:        92.200 cm/s LVOT VTI:          0.281 m LVOT/AV VTI ratio: 0.78  AORTA Ao Root diam: 4.00 cm Ao Asc diam:  2.90 cm MITRAL VALVE MV Area (PHT): 3.60 cm     SHUNTS MV Decel Time: 211 msec     Systemic VTI:  0.28 m MV E velocity: 89.10 cm/s   Systemic Diam: 2.60 cm MV A velocity: 127.00 cm/s MV E/A ratio:  0.70 Oswaldo Milian MD Electronically signed by Oswaldo Milian MD Signature Date/Time: 10/09/2021/12:09:31 PM    Final (Updated)         Scheduled Meds:  amLODipine  5 mg Oral Daily   budesonide (PULMICORT) nebulizer solution  0.25 mg Nebulization BID   [START ON 10/11/2021] COVID-19 mRNA bivalent vaccine (Moderna)  0.5 mL Intramuscular ONCE-1600   docusate sodium  100 mg Oral BID   heparin  5,000 Units Subcutaneous Q8H   hydrALAZINE  50 mg Oral Q8H   insulin aspart  0-9 Units Subcutaneous Q4H   ipratropium-albuterol  3 mL Nebulization Q6H   isosorbide mononitrate  120 mg Oral Daily   methylPREDNISolone (SOLU-MEDROL) injection  40 mg Intravenous Q24H   metoprolol tartrate  25 mg Oral BID   nicotine  14 mg Transdermal Daily   pneumococcal 23 valent vaccine  0.5 mL Intramuscular Tomorrow-1000   Continuous Infusions:  sodium chloride Stopped (10/09/21 1548)   azithromycin 500 mg (10/10/21 1248)   cefTRIAXone (ROCEPHIN)  IV 1 g (10/10/21 1035)     LOS:  1 day        Hosie Poisson, MD [facility]   To contact the attending provider between 7A-7P or the covering provider during after hours 7P-7A, please log into the web site [email or web address] and access using universal  password for that web site. If you do not have the password, please call the hospital operator.  10/10/2021, 1:42 PM

## 2021-10-11 ENCOUNTER — Inpatient Hospital Stay (HOSPITAL_COMMUNITY): Payer: Medicare Other

## 2021-10-11 DIAGNOSIS — J810 Acute pulmonary edema: Secondary | ICD-10-CM

## 2021-10-11 DIAGNOSIS — R609 Edema, unspecified: Secondary | ICD-10-CM

## 2021-10-11 LAB — GLUCOSE, CAPILLARY
Glucose-Capillary: 100 mg/dL — ABNORMAL HIGH (ref 70–99)
Glucose-Capillary: 109 mg/dL — ABNORMAL HIGH (ref 70–99)
Glucose-Capillary: 134 mg/dL — ABNORMAL HIGH (ref 70–99)
Glucose-Capillary: 136 mg/dL — ABNORMAL HIGH (ref 70–99)
Glucose-Capillary: 183 mg/dL — ABNORMAL HIGH (ref 70–99)
Glucose-Capillary: 92 mg/dL (ref 70–99)

## 2021-10-11 LAB — CBC
HCT: 34.9 % — ABNORMAL LOW (ref 39.0–52.0)
Hemoglobin: 11.1 g/dL — ABNORMAL LOW (ref 13.0–17.0)
MCH: 26.3 pg (ref 26.0–34.0)
MCHC: 31.8 g/dL (ref 30.0–36.0)
MCV: 82.7 fL (ref 80.0–100.0)
Platelets: 155 10*3/uL (ref 150–400)
RBC: 4.22 MIL/uL (ref 4.22–5.81)
RDW: 14.6 % (ref 11.5–15.5)
WBC: 7.8 10*3/uL (ref 4.0–10.5)
nRBC: 0 % (ref 0.0–0.2)

## 2021-10-11 LAB — BASIC METABOLIC PANEL
Anion gap: 7 (ref 5–15)
BUN: 28 mg/dL — ABNORMAL HIGH (ref 8–23)
CO2: 22 mmol/L (ref 22–32)
Calcium: 8.8 mg/dL — ABNORMAL LOW (ref 8.9–10.3)
Chloride: 107 mmol/L (ref 98–111)
Creatinine, Ser: 1.52 mg/dL — ABNORMAL HIGH (ref 0.61–1.24)
GFR, Estimated: 51 mL/min — ABNORMAL LOW (ref >60)
Glucose, Bld: 99 mg/dL (ref 70–99)
Potassium: 4 mmol/L (ref 3.5–5.1)
Sodium: 136 mmol/L (ref 135–145)

## 2021-10-11 MED ORDER — HYDRALAZINE HCL 50 MG PO TABS
75.0000 mg | ORAL_TABLET | Freq: Three times a day (TID) | ORAL | Status: DC
  Administered 2021-10-11 – 2021-10-12 (×3): 75 mg via ORAL
  Filled 2021-10-11 (×3): qty 1

## 2021-10-11 MED ORDER — EMPAGLIFLOZIN 10 MG PO TABS
10.0000 mg | ORAL_TABLET | Freq: Every day | ORAL | Status: DC
  Administered 2021-10-11 – 2021-10-12 (×2): 10 mg via ORAL
  Filled 2021-10-11 (×2): qty 1

## 2021-10-11 MED ORDER — AMLODIPINE BESYLATE 10 MG PO TABS
10.0000 mg | ORAL_TABLET | Freq: Every day | ORAL | Status: DC
  Administered 2021-10-12: 11:00:00 10 mg via ORAL
  Filled 2021-10-11: qty 1

## 2021-10-11 MED ORDER — AMLODIPINE BESYLATE 5 MG PO TABS
5.0000 mg | ORAL_TABLET | Freq: Once | ORAL | Status: AC
  Administered 2021-10-11: 16:00:00 5 mg via ORAL
  Filled 2021-10-11: qty 1

## 2021-10-11 MED ORDER — CLONIDINE HCL 0.1 MG PO TABS
0.2000 mg | ORAL_TABLET | Freq: Two times a day (BID) | ORAL | Status: DC
  Administered 2021-10-11 – 2021-10-12 (×2): 0.2 mg via ORAL
  Filled 2021-10-11 (×2): qty 2

## 2021-10-11 MED ORDER — INFLUENZA VAC A&B SA ADJ QUAD 0.5 ML IM PRSY
0.5000 mL | PREFILLED_SYRINGE | INTRAMUSCULAR | Status: AC
  Administered 2021-10-12: 11:00:00 0.5 mL via INTRAMUSCULAR
  Filled 2021-10-11: qty 0.5

## 2021-10-11 NOTE — Consult Note (Signed)
Referring Physician:   Cashen Kurylo is an 65 y.o. male.                       Chief Complaint: Shortness of breath  HPI: 65 years old male with PMH of hypertension, type 2 DM, CHF and medication and dietary non-compliance has shortness of breath with pulmonary edema from diastolic left heart failure. His BNP is elevated at 489.9 pg. EKG showed sinus tachycardia with LVH with repolarization changes. CXR showed mild pulmonary edema. He improved with diuresis and was extubated in 24 hours.His blood pressure control is still sub optimal.   Past Medical History:  Diagnosis Date   CHF (congestive heart failure) ([place])    Hypertension       History reviewed. No pertinent surgical history.  History reviewed. No pertinent family history. Social History:  reports that he has been smoking cigarettes. He has never used smokeless tobacco. No history on file for alcohol use and drug use.  Allergies: No Known Allergies  Medications Prior to Admission  Medication Sig Dispense Refill   acetaminophen (TYLENOL) 500 MG tablet Take 500-1,000 mg by mouth every 8 (eight) hours as needed for mild pain or headache.     albuterol (VENTOLIN HFA) 108 (90 Base) MCG/ACT inhaler Inhale 2-4 puffs into the lungs See admin instructions. Inhale 2-4 puffs into the lungs every four to six hours as needed for shortness of breath or wheezing     amLODipine (NORVASC) 5 MG tablet Take 5 mg by mouth daily.     atorvastatin (LIPITOR) 40 MG tablet Take 40 mg by mouth every evening.     BAYER ASPIRIN EC LOW DOSE 81 MG EC tablet Take 81 mg by mouth daily. Swallow whole.     cloNIDine (CATAPRES) 0.2 MG tablet Take 0.2 mg by mouth at bedtime.     Cyanocobalamin (VITAMIN B-12 PO) Take 1 tablet by mouth daily with breakfast.     docusate sodium (COLACE) 100 MG capsule Take 100 mg by mouth daily.     hydrALAZINE (APRESOLINE) 50 MG tablet Take 50 mg by mouth 3 (three) times daily.     metoprolol tartrate (LOPRESSOR) 25 MG tablet  Take 25 mg by mouth at bedtime.     Pyridoxine HCl (VITAMIN B-6 PO) Take 1 tablet by mouth daily with breakfast.     isosorbide mononitrate (IMDUR) 120 MG 24 hr tablet Take 120 mg by mouth daily.     torsemide (DEMADEX) 20 MG tablet Take 20 mg by mouth See admin instructions. Twice weekly as needed for fluid (Patient not taking: Reported on 10/09/2021)      Results for orders placed or performed during the hospital encounter of 10/08/21 (from the past 48 hour(s))  Glucose, capillary     Status: Abnormal   Collection Time: 10/09/21  7:25 PM  Result Value Ref Range   Glucose-Capillary 151 (H) 70 - 99 mg/dL    Comment: Glucose reference range applies only to samples taken after fasting for at least 8 hours.  Glucose, capillary     Status: Abnormal   Collection Time: 10/09/21 11:34 PM  Result Value Ref Range   Glucose-Capillary 130 (H) 70 - 99 mg/dL    Comment: Glucose reference range applies only to samples taken after fasting for at least 8 hours.  CBC     Status: Abnormal   Collection Time: 10/10/21  1:22 AM  Result Value Ref Range   WBC 8.4 4.0 - 10.5 K/uL  RBC 3.94 (L) 4.22 - 5.81 MIL/uL   Hemoglobin 10.4 (L) 13.0 - 17.0 g/dL   HCT 32.8 (L) 39.0 - 52.0 %   MCV 83.2 80.0 - 100.0 fL   MCH 26.4 26.0 - 34.0 pg   MCHC 31.7 30.0 - 36.0 g/dL   RDW 14.3 11.5 - 15.5 %   Platelets 135 (L) 150 - 400 K/uL    Comment: REPEATED TO VERIFY   nRBC 0.0 0.0 - 0.2 %    Comment: Performed at [facility], [place] [address], [place], [place] [id number]  Basic metabolic panel     Status: Abnormal   Collection Time: 10/10/21  1:22 AM  Result Value Ref Range   Sodium 138 135 - 145 mmol/L   Potassium 4.0 3.5 - 5.1 mmol/L   Chloride 109 98 - 111 mmol/L   CO2 22 22 - 32 mmol/L   Glucose, Bld 82 70 - 99 mg/dL    Comment: Glucose reference range applies only to samples taken after fasting for at least 8 hours.   BUN 37 (H) 8 - 23 mg/dL   Creatinine, Ser 1.85 (H) 0.61 - 1.24 mg/dL   Calcium 8.6 (L)  8.9 - 10.3 mg/dL   GFR, Estimated 40 (L) >60 mL/min    Comment: (NOTE) Calculated using the CKD-EPI Creatinine Equation (2021)    Anion gap 7 5 - 15    Comment: Performed at [facility] [address], [place], [place] [address]  Glucose, capillary     Status: None   Collection Time: 10/10/21  3:15 AM  Result Value Ref Range   Glucose-Capillary 95 70 - 99 mg/dL    Comment: Glucose reference range applies only to samples taken after fasting for at least 8 hours.  Glucose, capillary     Status: Abnormal   Collection Time: 10/10/21  8:22 AM  Result Value Ref Range   Glucose-Capillary 148 (H) 70 - 99 mg/dL    Comment: Glucose reference range applies only to samples taken after fasting for at least 8 hours.   Comment 1 Notify RN    Comment 2 Document in Chart   Glucose, capillary     Status: Abnormal   Collection Time: 10/10/21 11:22 AM  Result Value Ref Range   Glucose-Capillary 177 (H) 70 - 99 mg/dL    Comment: Glucose reference range applies only to samples taken after fasting for at least 8 hours.   Comment 1 Notify RN    Comment 2 Document in Chart   Glucose, capillary     Status: Abnormal   Collection Time: 10/10/21  4:12 PM  Result Value Ref Range   Glucose-Capillary 110 (H) 70 - 99 mg/dL    Comment: Glucose reference range applies only to samples taken after fasting for at least 8 hours.   Comment 1 Notify RN    Comment 2 Document in Chart   Glucose, capillary     Status: Abnormal   Collection Time: 10/10/21  8:59 PM  Result Value Ref Range   Glucose-Capillary 140 (H) 70 - 99 mg/dL    Comment: Glucose reference range applies only to samples taken after fasting for at least 8 hours.   Comment 1 Notify RN    Comment 2 Document in Chart   Glucose, capillary     Status: None   Collection Time: 10/10/21 11:57 PM  Result Value Ref Range   Glucose-Capillary 80 70 - 99 mg/dL    Comment: Glucose reference range  applies only to samples taken after fasting for at least 8  hours.   Comment 1 Notify RN    Comment 2 Document in Chart   CBC     Status: Abnormal   Collection Time: 10/11/21  1:50 AM  Result Value Ref Range   WBC 7.8 4.0 - 10.5 K/uL   RBC 4.22 4.22 - 5.81 MIL/uL   Hemoglobin 11.1 (L) 13.0 - 17.0 g/dL   HCT 34.9 (L) 39.0 - 52.0 %   MCV 82.7 80.0 - 100.0 fL   MCH 26.3 26.0 - 34.0 pg   MCHC 31.8 30.0 - 36.0 g/dL   RDW 14.6 11.5 - 15.5 %   Platelets 155 150 - 400 K/uL   nRBC 0.0 0.0 - 0.2 %    Comment: Performed at [facility], [place] [address], [place], [place] [id number]  Basic metabolic panel     Status: Abnormal   Collection Time: 10/11/21  1:50 AM  Result Value Ref Range   Sodium 136 135 - 145 mmol/L   Potassium 4.0 3.5 - 5.1 mmol/L   Chloride 107 98 - 111 mmol/L   CO2 22 22 - 32 mmol/L   Glucose, Bld 99 70 - 99 mg/dL    Comment: Glucose reference range applies only to samples taken after fasting for at least 8 hours.   BUN 28 (H) 8 - 23 mg/dL   Creatinine, Ser 1.52 (H) 0.61 - 1.24 mg/dL   Calcium 8.8 (L) 8.9 - 10.3 mg/dL   GFR, Estimated 51 (L) >60 mL/min    Comment: (NOTE) Calculated using the CKD-EPI Creatinine Equation (2021)    Anion gap 7 5 - 15    Comment: Performed at [facility] [address], [place], [name] [address]  Glucose, capillary     Status: None   Collection Time: 10/11/21  3:52 AM  Result Value Ref Range   Glucose-Capillary 92 70 - 99 mg/dL    Comment: Glucose reference range applies only to samples taken after fasting for at least 8 hours.   Comment 1 Notify RN    Comment 2 Document in Chart   Glucose, capillary     Status: Abnormal   Collection Time: 10/11/21  7:35 AM  Result Value Ref Range   Glucose-Capillary 109 (H) 70 - 99 mg/dL    Comment: Glucose reference range applies only to samples taken after fasting for at least 8 hours.  Glucose, capillary     Status: Abnormal   Collection Time: 10/11/21 11:30 AM  Result Value Ref Range   Glucose-Capillary 183 (H) 70 - 99 mg/dL    Comment:  Glucose reference range applies only to samples taken after fasting for at least 8 hours.  Glucose, capillary     Status: Abnormal   Collection Time: 10/11/21  4:08 PM  Result Value Ref Range   Glucose-Capillary 134 (H) 70 - 99 mg/dL    Comment: Glucose reference range applies only to samples taken after fasting for at least 8 hours.   DG Chest Port 1 View  Result Date: 10/10/2021 CLINICAL DATA:  Pulmonary edema EXAM: PORTABLE CHEST 1 VIEW COMPARISON:  10/09/2021 FINDINGS: Endotracheal and enteric tubes are no longer present. There is overall improved lung aeration with persistent atelectasis/consolidation at the right lung base. No pleural effusion or pneumothorax. Similar cardiomegaly. IMPRESSION: Overall improved lung aeration with persistent atelectasis/consolidation at the right lung base. Electronically Signed   By: Macy Mis M.D.   On: 10/10/2021 08:49  VAS [place] LOWER EXTREMITY VENOUS (DVT)  Result Date: 10/11/2021  Lower Venous DVT Study Patient Name:  ERIAN AXSON Hubner  Date of Exam:   10/11/2021 Medical Rec #: [id number]         Accession #:    [id number] Date of Birth: 1956/09/13         Patient Gender: M Patient Age:   1 years Exam Location:  [facility] Procedure:      VAS [place] LOWER EXTREMITY VENOUS (DVT) Referring Phys: Vance Gather --------------------------------------------------------------------------------  Indications: Edema.  Comparison Study: No prior study Performing Technologist: Maudry Mayhew MHA, RDMS, RVT, RDCS  Examination Guidelines: A complete evaluation includes B-mode imaging, spectral Doppler, color Doppler, and power Doppler as needed of all accessible portions of each vessel. Bilateral testing is considered an integral part of a complete examination. Limited examinations for reoccurring indications may be performed as noted. The reflux portion of the exam is performed with the patient in reverse Trendelenburg.   +---------+---------------+---------+-----------+----------+--------------+  RIGHT     Compressibility Phasicity Spontaneity Properties Thrombus Aging  +---------+---------------+---------+-----------+----------+--------------+  CFV       Full            Yes       Yes                                    +---------+---------------+---------+-----------+----------+--------------+  SFJ       Full                                                             +---------+---------------+---------+-----------+----------+--------------+  FV Prox   Full                                                             +---------+---------------+---------+-----------+----------+--------------+  FV Mid    Full                                                             +---------+---------------+---------+-----------+----------+--------------+  FV Distal Full                                                             +---------+---------------+---------+-----------+----------+--------------+  PFV       Full                                                             +---------+---------------+---------+-----------+----------+--------------+  POP       Full  Yes       Yes                                    +---------+---------------+---------+-----------+----------+--------------+  PTV       Full                                                             +---------+---------------+---------+-----------+----------+--------------+  PERO      Full                                                             +---------+---------------+---------+-----------+----------+--------------+   +----+---------------+---------+-----------+----------+--------------+  LEFT Compressibility Phasicity Spontaneity Properties Thrombus Aging  +----+---------------+---------+-----------+----------+--------------+  CFV  Full            Yes       Yes                                     +----+---------------+---------+-----------+----------+--------------+     Summary: RIGHT: - There is no evidence of deep vein thrombosis in the lower extremity.  - No cystic structure found in the popliteal fossa.  LEFT: - No evidence of common femoral vein obstruction.  *See table(s) above for measurements and observations. Electronically signed by Orlie Pollen on 10/11/2021 at 4:13:14 PM.    Final     Review Of Systems Constitutional: No fever, chills, chronic weight gain. Eyes: No vision change, wears glasses. No discharge or pain. Ears: No hearing loss, No tinnitus. Respiratory: No asthma, COPD, pneumonias. Positive shortness of breath. No hemoptysis. Cardiovascular: No chest pain, palpitation, positive leg edema. Gastrointestinal: No nausea, vomiting, diarrhea, constipation. No GI bleed. No hepatitis. Genitourinary: No dysuria, hematuria, kidney stone. No incontinance. Neurological: No headache, stroke, seizures.  Psychiatry: No psych facility admission for anxiety, depression, suicide. No detox. Skin: No rash. Musculoskeletal: Psitive joint pain, no fibromyalgia. Positive neck pain, back pain. Lymphadenopathy: No lymphadenopathy. Hematology: No anemia or easy bruising.   Blood pressure (!) 162/118, pulse 60, temperature 97.6 F (36.4 C), temperature source Oral, resp. rate 19, height 5\' 10"  (1.778 m), weight 86.6 kg, SpO2 95 %. Body mass index is 27.39 kg/m. General appearance: alert, cooperative, appears stated age and has mild respiratory distress Head: Normocephalic, atraumatic. Eyes: Brown eyes, pink conjunctiva, corneas clear. PERRL, EOM's intact. Neck: No adenopathy, no carotid bruit, no JVD, supple, symmetrical, trachea midline and thyroid not enlarged. Resp: Clear to auscultation bilaterally. Cardio: Regular rate and rhythm, S1, S2 normal, II/VI systolic murmur, no click, rub or gallop GI: Soft, non-tender; bowel sounds normal; no organomegaly. Extremities: No edema,  cyanosis or clubbing. Skin: Warm and dry.  Neurologic: Alert and oriented X 3, normal strength. Normal coordination and gait.  Assessment/Plan Acute diastolic left heart failure Dilated cardiomyopathy HTN, uncontrolled Type 2 DM Obesity  Plan: Increase Clonidine to bid. Add Jardiance for type 2 DM control in patient with CHF. Discussed importance of low salt low fat diet.  Time spent: Review of  old records, Lab, x-rays, EKG, other cardiac tests, examination, discussion with patient/Nurse/Doctor over 70 minutes.  Ricki Rodriguez, MD  10/11/2021, 6:05 PM

## 2021-10-11 NOTE — Progress Notes (Signed)
Right lower extremity venous duplex completed. Refer to "CV Proc" under chart review to view preliminary results.  10/11/2021 2:41 PM Eula Fried., MHA, RVT, RDCS, RDMS

## 2021-10-11 NOTE — Plan of Care (Signed)
°  Problem: Safety: Goal: Non-violent Restraint(s) Outcome: Progressing   Problem: Education: Goal: Knowledge of General Education information will improve Description: Including pain rating scale, medication(s)/side effects and non-pharmacologic comfort measures Outcome: Progressing   Problem: Health Behavior/Discharge Planning: Goal: Ability to manage health-related needs will improve Outcome: Progressing   Problem: Clinical Measurements: Goal: Ability to maintain clinical measurements within normal limits will improve Outcome: Progressing Goal: Will remain free from infection Outcome: Progressing Goal: Diagnostic test results will improve Outcome: Progressing Goal: Respiratory complications will improve Outcome: Progressing Goal: Cardiovascular complication will be avoided Outcome: Progressing   Problem: Activity: Goal: Risk for activity intolerance will decrease Outcome: Progressing   Problem: Nutrition: Goal: Adequate nutrition will be maintained Outcome: Progressing   Problem: Coping: Goal: Level of anxiety will decrease Outcome: Progressing   Problem: Elimination: Goal: Will not experience complications related to bowel motility Outcome: Progressing Goal: Will not experience complications related to urinary retention Outcome: Progressing   Problem: Pain Managment: Goal: General experience of comfort will improve Outcome: Progressing   Problem: Safety: Goal: Ability to remain free from injury will improve Outcome: Progressing

## 2021-10-11 NOTE — Progress Notes (Signed)
PROGRESS NOTE  Phillip Kennedy  RJJ:[id number] DOB: [birth year] DOA: 10/08/2021 PCP: Orpah Cobb, MD  Brief Narrative: Phillip Kennedy is a 65 y.o. male with a history of CHD, HTN, COPD who arrived to [facility] ED by EMS with respiratory distress requiring intubation 12/18. Work up revealed hypertensive urgency for which home medications have been restarted and titrated upward and pneumonia which has been treated with antibiotics. He was extubated 12/19 and remains severely hypertensive with improved dyspnea, resolved hypoxemia.  Assessment & Plan: Principal Problem:   Acute respiratory failure with hypoxia (HCC) Active Problems:   Hypertensive emergency   AKI (acute kidney injury) (HCC)   Hyperglycemia   Pulmonary edema   Acute hypoxic respiratory failure due to HTN emergency causing acute pulmonary edema due to acute on chronic combined HFrEF and CAP: LVEF 45-50% without regional wall motion abnormalities.  - Weaned from O2 - Tx conditions itemized below.  HTN emergency, acute on chronic combined HFrEF:  - Cardiology/pt's PCP notified of admission. Will continue GDMT as tolerated.  - Continue metoprolol (at ceiling dose due to bradycardia), hydralazine, clonidine. Augmented norvasc, though augmenting bidil may be better option if pt has part D coverage. Will augment hydralazine as isosorbide is 120mg  dose already. Defer to cardiology. Appears euvolemic.   CAP:  - Continue ceftriaxone/azithromycin with plans to complete 5 days Tx (can switch to po once discharged)  COPD: With presumed exacerbation on admission which has resolved. Taper steroids to off. Follow up with pulmonary clinic as outpatient.   Acute hypoxic and hypercarbic encephalopathy: Resolved.   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 Pt feels he's been on a blood thinner in the past but can't recall which or why.  - R/o DVT with venous U/S.   AKI: Resolved. Cr improving.   Thrombocytopenia: Resolved.   Mild  anemia of chronic disease (putative diagnosis): Stable.  - PCP follow up recommended.   DVT prophylaxis: Heparin Code Status: Full Family Communication: None at bedside Disposition Plan:  Status is: Inpatient  Remains inpatient appropriate because: Continued severe range HTN. Medication changes as above, r/o DVT. Anticipate DC home 12/22. PCP notified of admission.  Consultants:  PCCM Cardiology/PCP  Procedures:  ETT 12/18 - 12/19  Antimicrobials: Ceftriaxone, azithromycin   Subjective: Breathing back to baseline, BP has remained with MAP >110 throughout the day. Some mild orthopnea reported. RLE swelling noted.  Objective: Vitals:   10/11/21 0732 10/11/21 1113 10/11/21 1404 10/11/21 1605  BP: (!) 174/117 (!) 138/98 (!) 136/104 (!) 162/118  Pulse: 65 (!) 55 (!) 59 60  Resp: 18 14 16 19   Temp: 98.1 F (36.7 C) 98 F (36.7 C)  97.6 F (36.4 C)  TempSrc: Oral Oral  Oral  SpO2: 98% 94% 100% 95%  Weight:      Height:        Intake/Output Summary (Last 24 hours) at 10/11/2021 1702 Last data filed at 10/11/2021 1653 Gross per 24 hour  Intake 1310 ml  Output 2050 ml  Net -740 ml   Filed Weights   10/09/21 0459 10/10/21 0657 10/11/21 0500  Weight: 90 kg 87.5 kg 86.6 kg    Gen: 65 y.o. male in no distress  Pulm: Non-labored breathing at rest. Clear to auscultation bilaterally.  CV: Regular rate and rhythm. No murmur, rub, or gallop. No JVD, RLE pitting edema. GI: Abdomen soft, non-tender, non-distended, with normoactive bowel sounds. No organomegaly or masses felt. Ext: Warm, no deformities Skin: No rashes, lesions or ulcers on visualized skin.  Neuro: Alert and oriented. No focal neurological deficits. Psych: Judgement and insight appear normal. Mood & affect appropriate.   Data Reviewed: I have personally reviewed following labs and imaging studies  CBC: Recent Labs  Lab 10/08/21 2300 10/08/21 2359 10/09/21 0037 10/09/21 0316 10/09/21 0604 10/10/21 0122  10/11/21 0150  WBC 5.0  --   --   --  5.7 8.4 7.8  HGB 13.3   < > 15.3 11.6* 11.1* 10.4* 11.1*  HCT 43.0   < > 45.0 34.0* 34.7* 32.8* 34.9*  MCV 86.9  --   --   --  84.4 83.2 82.7  PLT 194  --   --   --  146* 135* 155   < > = values in this interval not displayed.   Basic Metabolic Panel: Recent Labs  Lab 10/08/21 2300 10/08/21 2359 10/09/21 0037 10/09/21 0316 10/09/21 0604 10/10/21 0122 10/11/21 0150  NA 135   < > 139 139 136 138 136  K 4.4   < > 4.4 3.9 3.9 4.0 4.0  CL 105  --  106  --  107 109 107  CO2 23  --   --   --  21* 22 22  GLUCOSE 225*  --  222*  --  162* 82 99  BUN 24*  --  29*  --  28* 37* 28*  CREATININE 1.80*  --  1.70*  --  2.13* 1.85* 1.52*  CALCIUM 8.9  --   --   --  8.5* 8.6* 8.8*  MG  --   --   --   --  2.6*  --   --    < > = values in this interval not displayed.   GFR: Estimated Creatinine Clearance: 50 mL/min (A) (by C-G formula based on SCr of 1.52 mg/dL (H)). Liver Function Tests: Recent Labs  Lab 10/08/21 2300  AST 49*  ALT 41  ALKPHOS 85  BILITOT 0.7  PROT 7.2  ALBUMIN 3.7   No results for input(s): LIPASE, AMYLASE in the last 168 hours. Recent Labs  Lab 10/09/21 0604  AMMONIA 40*   Coagulation Profile: No results for input(s): INR, PROTIME in the last 168 hours. Cardiac Enzymes: No results for input(s): CKTOTAL, CKMB, CKMBINDEX, TROPONINI in the last 168 hours. BNP (last 3 results) No results for input(s): PROBNP in the last 8760 hours. HbA1C: Recent Labs    10/09/21 0604  HGBA1C 7.0*   CBG: Recent Labs  Lab 10/10/21 2357 10/11/21 0352 10/11/21 0735 10/11/21 1130 10/11/21 1608  GLUCAP 80 92 109* 183* 134*   Lipid Profile: No results for input(s): CHOL, HDL, LDLCALC, TRIG, CHOLHDL, LDLDIRECT in the last 72 hours. Thyroid Function Tests: No results for input(s): TSH, T4TOTAL, FREET4, T3FREE, THYROIDAB in the last 72 hours. Anemia Panel: No results for input(s): VITAMINB12, FOLATE, FERRITIN, TIBC, IRON, RETICCTPCT in  the last 72 hours. Urine analysis: No results found for: COLORURINE, APPEARANCEUR, LABSPEC, PHURINE, GLUCOSEU, HGBUR, BILIRUBINUR, KETONESUR, PROTEINUR, UROBILINOGEN, NITRITE, LEUKOCYTESUR Recent Results (from the past 240 hour(s))  Resp Panel by RT-PCR (Flu A&B, Covid) Nasopharyngeal Swab     Status: None   Collection Time: 10/08/21 11:02 PM   Specimen: Nasopharyngeal Swab; Nasopharyngeal(NP) swabs in vial transport medium  Result Value Ref Range Status   SARS Coronavirus 2 by RT PCR NEGATIVE NEGATIVE Final    Comment: (NOTE) SARS-CoV-2 target nucleic acids are NOT DETECTED.  The SARS-CoV-2 RNA is generally detectable in upper respiratory specimens during the acute phase of infection. The  lowest concentration of SARS-CoV-2 viral copies this assay can detect is 138 copies/mL. A negative result does not preclude SARS-Cov-2 infection and should not be used as the sole basis for treatment or other patient management decisions. A negative result may occur with  improper specimen collection/handling, submission of specimen other than nasopharyngeal swab, presence of viral mutation(s) within the areas targeted by this assay, and inadequate number of viral copies(<138 copies/mL). A negative result must be combined with clinical observations, patient history, and epidemiological information. The expected result is Negative.  Fact Sheet for Patients:  BloggerCourse.com  Fact Sheet for Healthcare Providers:  SeriousBroker.it  This test is no t yet approved or cleared by the [place] FDA and  has been authorized for detection and/or diagnosis of SARS-CoV-2 by FDA under an Emergency Use Authorization (EUA). This EUA will remain  in effect (meaning this test can be used) for the duration of the COVID-19 declaration under Section 564(b)(1) of the Act, 21 U.S.C.section 360bbb-3(b)(1), unless the authorization is terminated  or revoked  sooner.       Influenza A by PCR NEGATIVE NEGATIVE Final   Influenza B by PCR NEGATIVE NEGATIVE Final    Comment: (NOTE) The Xpert Xpress SARS-CoV-2/FLU/RSV plus assay is intended as an aid in the diagnosis of influenza from Nasopharyngeal swab specimens and should not be used as a sole basis for treatment. Nasal washings and aspirates are unacceptable for Xpert Xpress SARS-CoV-2/FLU/RSV testing.  Fact Sheet for Patients: BloggerCourse.com  Fact Sheet for Healthcare Providers: SeriousBroker.it  This test is not yet approved or cleared by the [place] FDA and has been authorized for detection and/or diagnosis of SARS-CoV-2 by FDA under an Emergency Use Authorization (EUA). This EUA will remain in effect (meaning this test can be used) for the duration of the COVID-19 declaration under Section 564(b)(1) of the Act, 21 U.S.C. section 360bbb-3(b)(1), unless the authorization is terminated or revoked.  Performed at [facility], [address], [place], [place] [address]   MRSA Next Gen by PCR, Nasal     Status: None   Collection Time: 10/09/21  2:30 AM   Specimen: Nasal Mucosa; Nasal Swab  Result Value Ref Range Status   MRSA by PCR Next Gen NOT DETECTED NOT DETECTED Final    Comment: (NOTE) The GeneXpert MRSA Assay (FDA approved for NASAL specimens only), is one component of a comprehensive MRSA colonization surveillance program. It is not intended to diagnose MRSA infection nor to guide or monitor treatment for MRSA infections. Test performance is not FDA approved in patients less than 73 years old. Performed at [facility], [address], [place], [place] [address]       Radiology Studies: DG Chest Port 1 View  Result Date: 10/10/2021 CLINICAL DATA:  Pulmonary edema EXAM: PORTABLE CHEST 1 VIEW COMPARISON:  10/09/2021 FINDINGS: Endotracheal and enteric tubes are no longer present. There is overall  improved lung aeration with persistent atelectasis/consolidation at the right lung base. No pleural effusion or pneumothorax. Similar cardiomegaly. IMPRESSION: Overall improved lung aeration with persistent atelectasis/consolidation at the right lung base. Electronically Signed   By: Guadlupe Spanish M.D.   On: 10/10/2021 08:49   VAS [place] LOWER EXTREMITY VENOUS (DVT)  Result Date: 10/11/2021  Lower Venous DVT Study Patient Name:  CAMAR GUYTON Nalepa  Date of Exam:   10/11/2021 Medical Rec #: [id number]         Accession #:    [id number] Date of Birth: 05/22/1956  Patient Gender: M Patient Age:   27 years Exam Location:  [facility] Procedure:      VAS [place] LOWER EXTREMITY VENOUS (DVT) Referring Phys: Hazeline Junker --------------------------------------------------------------------------------  Indications: Edema.  Comparison Study: No prior study Performing Technologist: Gertie Fey MHA, RDMS, RVT, RDCS  Examination Guidelines: A complete evaluation includes B-mode imaging, spectral Doppler, color Doppler, and power Doppler as needed of all accessible portions of each vessel. Bilateral testing is considered an integral part of a complete examination. Limited examinations for reoccurring indications may be performed as noted. The reflux portion of the exam is performed with the patient in reverse Trendelenburg.  +---------+---------------+---------+-----------+----------+--------------+  RIGHT     Compressibility Phasicity Spontaneity Properties Thrombus Aging  +---------+---------------+---------+-----------+----------+--------------+  CFV       Full            Yes       Yes                                    +---------+---------------+---------+-----------+----------+--------------+  SFJ       Full                                                             +---------+---------------+---------+-----------+----------+--------------+  FV Prox   Full                                                              +---------+---------------+---------+-----------+----------+--------------+  FV Mid    Full                                                             +---------+---------------+---------+-----------+----------+--------------+  FV Distal Full                                                             +---------+---------------+---------+-----------+----------+--------------+  PFV       Full                                                             +---------+---------------+---------+-----------+----------+--------------+  POP       Full            Yes       Yes                                    +---------+---------------+---------+-----------+----------+--------------+  PTV  Full                                                             +---------+---------------+---------+-----------+----------+--------------+  PERO      Full                                                             +---------+---------------+---------+-----------+----------+--------------+   +----+---------------+---------+-----------+----------+--------------+  LEFT Compressibility Phasicity Spontaneity Properties Thrombus Aging  +----+---------------+---------+-----------+----------+--------------+  CFV  Full            Yes       Yes                                    +----+---------------+---------+-----------+----------+--------------+     Summary: RIGHT: - There is no evidence of deep vein thrombosis in the lower extremity.  - No cystic structure found in the popliteal fossa.  LEFT: - No evidence of common femoral vein obstruction.  *See table(s) above for measurements and observations. Electronically signed by Gerarda Fraction on 10/11/2021 at 4:13:14 PM.    Final     Scheduled Meds:  [START ON 10/12/2021] amLODipine  10 mg Oral Daily   budesonide (PULMICORT) nebulizer solution  0.25 mg Nebulization BID   cloNIDine  0.2 mg Oral Daily   COVID-19 mRNA bivalent vaccine (Moderna)  0.5 mL Intramuscular ONCE-1600   docusate  sodium  100 mg Oral BID   heparin  5,000 Units Subcutaneous Q8H   hydrALAZINE  75 mg Oral Q8H   [START ON 10/12/2021] influenza vaccine adjuvanted  0.5 mL Intramuscular Tomorrow-1000   insulin aspart  0-9 Units Subcutaneous Q4H   isosorbide mononitrate  120 mg Oral Daily   metoprolol tartrate  25 mg Oral BID   nicotine  14 mg Transdermal Daily   pneumococcal 23 valent vaccine  0.5 mL Intramuscular Tomorrow-1000   predniSONE  40 mg Oral QAC breakfast   Continuous Infusions:  sodium chloride Stopped (10/09/21 1548)   cefTRIAXone (ROCEPHIN)  IV 1 g (10/11/21 1110)     LOS: 2 days   Time spent: 25 minutes.  Tyrone Nine, MD [facility] [email or web address] 10/11/2021, 5:02 PM

## 2021-10-12 ENCOUNTER — Other Ambulatory Visit (HOSPITAL_COMMUNITY): Payer: Self-pay

## 2021-10-12 LAB — GLUCOSE, CAPILLARY
Glucose-Capillary: 102 mg/dL — ABNORMAL HIGH (ref 70–99)
Glucose-Capillary: 134 mg/dL — ABNORMAL HIGH (ref 70–99)

## 2021-10-12 MED ORDER — HYDRALAZINE HCL 100 MG PO TABS: 100.0000 mg | ORAL_TABLET | Freq: Three times a day (TID) | ORAL | 0 refills | Status: DC

## 2021-10-12 MED ORDER — CLONIDINE HCL 0.2 MG PO TABS: 0.2000 mg | ORAL_TABLET | Freq: Three times a day (TID) | ORAL | 0 refills | Status: DC

## 2021-10-12 MED ORDER — DOXYCYCLINE HYCLATE 100 MG PO CAPS: 100.0000 mg | ORAL_CAPSULE | Freq: Two times a day (BID) | ORAL | 0 refills | Status: DC

## 2021-10-12 MED ORDER — ISOSORBIDE MONONITRATE ER 120 MG PO TB24: 120.0000 mg | ORAL_TABLET | Freq: Every day | ORAL | 0 refills | Status: DC

## 2021-10-12 MED ORDER — EMPAGLIFLOZIN 10 MG PO TABS: 10.0000 mg | ORAL_TABLET | Freq: Every day | ORAL | 0 refills | Status: DC

## 2021-10-12 MED ORDER — PREDNISONE 20 MG PO TABS: 40.0000 mg | ORAL_TABLET | Freq: Every day | ORAL | 0 refills | Status: DC

## 2021-10-12 NOTE — Care Management Important Message (Signed)
Important Message  Patient Details  Name: Phillip Kennedy MRN: [id number] Date of Birth: 06-18-1956   Medicare Important Message Given:  Yes     Dorena Bodo 10/12/2021, 2:32 PM

## 2021-10-12 NOTE — TOC Benefit Eligibility Note (Signed)
Patient Product/process development scientist completed.    The patient is currently admitted and upon discharge could be taking Jardiance 10 mg.  The current 30 day co-pay is, $9.85.   The patient is insured through [employer] Medicare Part D     Roland Earl, CPhT Pharmacy Patient Advocate Specialist [facility] Patient Advocate Team Direct Number: [phone]  Fax: [phone]

## 2021-10-12 NOTE — Discharge Summary (Signed)
Physician Discharge Summary  Corrin Hubbart Kennedy [id number] DOB: August 20, 1956 DOA: 10/08/2021  PCP: [name], MD  Admit date: 10/08/2021 Discharge date: 10/12/2021  Admitted From: Home Disposition: Home   Recommendations for Outpatient Follow-up:  Follow up with PCP in 1-2 weeks with repeat clinical assessment, vital signs, blood pressure management, GDMT for CHF. Pulmonary follow up may be helpful.   Home Health: None Equipment/Devices: None Discharge Condition: Stable CODE STATUS: Full Diet recommendation: Heart healthy  Brief/Interim Summary: Phillip Kennedy is a 65 y.o. male with a history of CHD, HTN, COPD who arrived to [facility] ED by EMS with respiratory distress requiring intubation 12/18. Work up revealed hypertensive urgency for which home medications have been restarted and titrated upward and pneumonia which has been treated with antibiotics. He was extubated 12/19 and remained severely hypertensive with improved dyspnea, resolved hypoxemia. Medications were titrated with improvement and tolerance. The patient's cardiologist/PCP saw him while in the hospital and will follow up after discharge.   Discharge Diagnoses:  Principal Problem:   Acute respiratory failure with hypoxia ([place]) Active Problems:   Hypertensive emergency   AKI (acute kidney injury) ([place])   Hyperglycemia   Pulmonary edema  Acute hypoxic respiratory failure due to HTN emergency causing acute pulmonary edema due to acute on chronic combined HFrEF and CAP: LVEF 45-50% without regional wall motion abnormalities.  - Weaned from O2 - Tx conditions itemized below.   HTN emergency, acute on chronic combined HFrEF:  - Cardiology/pt's PCP notified of admission. Will continue GDMT as tolerated.  - Continue metoprolol (at ceiling dose due to bradycardia),  - START hydralazine 100mg  (up from 50mg ) three times daily.  - START clonidine 0.2mg  three times a day (up from once daily) - START jardiance once daily    - RESTART taking isosorbide 120mg  daily (new prescription, pt not previously taking) - Follow up with Dr. Doylene Canard  CAP:  - Complete course with doxycycline at DC  COPD with acute exacerbation: With presumed exacerbation on admission which has resolved. Taper steroids, 2 more days after discharge.  - Follow up with pulmonary clinic as outpatient.    Acute hypoxic and hypercarbic encephalopathy: Resolved.    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 Pt feels he's been on a blood thinner in the past but can't recall which or why.  - No DVT with venous U/S.    AKI: Resolved. Cr improving.    Thrombocytopenia: Resolved.    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  - PCP follow up recommended.   Discharge Instructions Discharge Instructions     (HEART FAILURE PATIENTS) Call MD:  Anytime you have any of the following symptoms: 1) 3 pound weight gain in 24 hours or 5 pounds in 1 week 2) shortness of breath, with or without a dry hacking cough 3) swelling in the hands, feet or stomach 4) if you have to sleep on extra pillows at night in order to breathe.   Complete by: As directed    Diet - low sodium heart healthy   Complete by: As directed    Discharge instructions   Complete by: As directed    You were admitted for respiratory failure for which you've been treated with antibiotics, steroids, and blood pressure medications with improvement. You are stable for discharge with the following recommendations:  - Continue taking doxycycline for 2 more days - Continue taking prednisone for 2 more days - START taking hydralazine 100mg  (up from 50mg ) three times daily.  -  START taking clonidine 0.2mg  three times a day (up from once daily) - START taking jardiance once daily (new medication to help with diabetes and high blood pressure/heart failure.  - RESTART taking isosorbide 120mg  daily (new prescription sent to your pharmacy) - Follow up with Dr. Doylene Canard in the next  week or so to recheck blood pressure. - If your breathing becomes worse, your blood pressure goes higher, or you develop chest pain, headache, blurry vision, localized numbness or weakness, seek medical attention right away.   Increase activity slowly   Complete by: As directed       Allergies as of 10/12/2021   No Known Allergies      Medication List     STOP taking these medications    torsemide 20 MG tablet Commonly known as: DEMADEX       TAKE these medications    acetaminophen 500 MG tablet Commonly known as: TYLENOL Take 500-1,000 mg by mouth every 8 (eight) hours as needed for mild pain or headache.   albuterol 108 (90 Base) MCG/ACT inhaler Commonly known as: VENTOLIN HFA Inhale 2-4 puffs into the lungs See admin instructions. Inhale 2-4 puffs into the lungs every four to six hours as needed for shortness of breath or wheezing   amLODipine 5 MG tablet Commonly known as: NORVASC Take 5 mg by mouth daily.   atorvastatin 40 MG tablet Commonly known as: LIPITOR Take 40 mg by mouth every evening.   Bayer Aspirin EC Low Dose 81 MG EC tablet Generic drug: aspirin Take 81 mg by mouth daily. Swallow whole.   cloNIDine 0.2 MG tablet Commonly known as: CATAPRES Take 1 tablet (0.2 mg total) by mouth 3 (three) times daily. What changed: when to take this   docusate sodium 100 MG capsule Commonly known as: COLACE Take 100 mg by mouth daily.   doxycycline 100 MG capsule Commonly known as: VIBRAMYCIN Take 1 capsule (100 mg total) by mouth 2 (two) times daily.   empagliflozin 10 MG Tabs tablet Commonly known as: JARDIANCE Take 1 tablet (10 mg total) by mouth daily.   hydrALAZINE 100 MG tablet Commonly known as: APRESOLINE Take 1 tablet (100 mg total) by mouth 3 (three) times daily. What changed:  medication strength how much to take   isosorbide mononitrate 120 MG 24 hr tablet Commonly known as: IMDUR Take 1 tablet (120 mg total) by mouth daily.    metoprolol tartrate 25 MG tablet Commonly known as: LOPRESSOR Take 25 mg by mouth at bedtime.   predniSONE 20 MG tablet Commonly known as: DELTASONE Take 2 tablets (40 mg total) by mouth daily before breakfast. Start taking on: October 13, 2021   VITAMIN B-12 PO Take 1 tablet by mouth daily with breakfast.   VITAMIN B-6 PO Take 1 tablet by mouth daily with breakfast.        Follow-up Information     [name], MD. Schedule an appointment as soon as possible for a visit in 1 week(s).   Specialty: Cardiology Contact information: [place] [phone]                No Known Allergies  Consultations: Cardiology PCCM  Procedures/Studies: DG Abdomen 1 View  Result Date: 10/08/2021 CLINICAL DATA:  NG placement confirmation. EXAM: ABDOMEN - 1 VIEW COMPARISON:  None. FINDINGS: The bowel gas pattern is normal. No radio-opaque calculi or other significant radiographic abnormality are seen. Enteric tube courses around the proximal gastric curvature with the  tip in the body of stomach. There is no supine evidence of free air. IMPRESSION: Enteric tube within the stomach with the tip in the body of stomach. No acute radiographic findings. Electronically Signed   By: Telford Nab M.D.   On: 10/08/2021 23:30   CT HEAD WO CONTRAST (5MM)  Result Date: 10/09/2021 CLINICAL DATA:  Altered mental status EXAM: CT HEAD WITHOUT CONTRAST TECHNIQUE: Contiguous axial images were obtained from the base of the skull through the vertex without intravenous contrast. COMPARISON:  None. FINDINGS: Brain: Normal anatomic configuration. Mild periventricular white matter changes are present likely reflecting the sequela of small vessel ischemia. No abnormal intra or extra-axial mass lesion or fluid collection. No abnormal mass effect or midline shift. No evidence of acute intracranial hemorrhage or infarct. Ventricular size is normal. Cerebellum unremarkable.  Vascular: No asymmetric hyperdense vasculature at the skull base. Skull: Intact Sinuses/Orbits: There is mucosal thickening involving the ethmoid air cells bilaterally as well as layering fluid within the maxillary sinuses and, to a lesser extent, sphenoid sinuses bilaterally. Small mucous retention cyst noted within the left sphenoid sinus. Remote left medial orbital wall fracture. Orbits are unremarkable. Other: Mastoid air cells and middle ear cavities are clear. IMPRESSION: No acute intracranial abnormality.  Mild senescent change. Moderate paranasal sinus disease. Air-fluid levels can be seen the setting of acute sinusitis. Electronically Signed   By: Fidela Salisbury M.D.   On: 10/09/2021 02:11   DG Chest Port 1 View  Result Date: 10/10/2021 CLINICAL DATA:  Pulmonary edema EXAM: PORTABLE CHEST 1 VIEW COMPARISON:  10/09/2021 FINDINGS: Endotracheal and enteric tubes are no longer present. There is overall improved lung aeration with persistent atelectasis/consolidation at the right lung base. No pleural effusion or pneumothorax. Similar cardiomegaly. IMPRESSION: Overall improved lung aeration with persistent atelectasis/consolidation at the right lung base. Electronically Signed   By: Macy Mis M.D.   On: 10/10/2021 08:49   DG Chest Port 1 View  Result Date: 10/09/2021 CLINICAL DATA:  Respiratory failure with hypoxia EXAM: PORTABLE CHEST 1 VIEW COMPARISON:  10/08/2021 FINDINGS: Endotracheal tube terminates 7.3 cm above carina. Nasogastric tube is looped in the stomach with tip directed superiorly, likely in the esophagus. Mild cardiomegaly. No pleural effusion or pneumothorax. Interstitial edema is improved, mild. Left base airspace disease is new or increased. IMPRESSION: Nasogastric tube likely looped in the stomach with tip directed superiorly in the distal esophagus. Correlate with abdominal radiographs. Improvement in mild interstitial edema. Developing left base Airspace disease, likely  atelectasis. These results will be called to the ordering clinician or representative by the Radiologist Assistant, and communication documented in the PACS or [employer]. Electronically Signed   By: Abigail Miyamoto M.D.   On: 10/09/2021 08:15   DG Chest Port 1 View  Result Date: 10/08/2021 CLINICAL DATA:  Shortness of breath EXAM: PORTABLE CHEST 1 VIEW COMPARISON:  01/17/2018 FINDINGS: Cardiac shadow is within normal limits. Tortuous thoracic aorta is noted. Endotracheal tube is noted in satisfactory position. Gastric catheter extends towards the stomach. Vascular congestion and mild parenchymal edema is noted. No bony abnormality is seen. IMPRESSION: Changes of mild CHF. Tubes and lines in satisfactory position. Electronically Signed   By: Inez Catalina M.D.   On: 10/08/2021 23:28   ECHOCARDIOGRAM COMPLETE  Result Date: 10/09/2021    ECHOCARDIOGRAM REPORT   Patient Name:   Phillip Kennedy Date of Exam: 10/09/2021 Medical Rec #:  [id number]        Height:  70.0 in Accession #:    [id number]       Weight:       198.4 lb Date of Birth:  1956-06-23        BSA:          2.080 m Patient Age:    72 years         BP:           158/116 mmHg Patient Gender: M                HR:           68 bpm. Exam Location:  Inpatient Procedure: 2D Echo, 3D Echo, Cardiac Doppler and Color Doppler Indications:     R06.02 SOB  History:         Patient has no prior history of Echocardiogram examinations.                  Signs/Symptoms:Dyspnea and Shortness of Breath; Risk                  Factors:Hypertension. Hypoxia.  Sonographer:     Roseanna Rainbow RDCS Referring Phys:  [id number] Chrystie Nose PAYNE Diagnosing Phys: Oswaldo Milian MD  Sonographer Comments: Technically difficult study due to poor echo windows. IMPRESSIONS  1. Left ventricular ejection fraction, by estimation, is 45 to 50%. The left ventricle has mildly decreased function. The left ventricle demonstrates global hypokinesis. The left ventricular internal cavity  size was moderately dilated. There is mild left ventricular hypertrophy. Left ventricular diastolic parameters are consistent with Grade I diastolic dysfunction (impaired relaxation).  2. Right ventricular systolic function is normal. The right ventricular size is normal. Tricuspid regurgitation signal is inadequate for assessing PA pressure.  3. The mitral valve is normal in structure. Trivial mitral valve regurgitation. No evidence of mitral stenosis.  4. The aortic valve was not well visualized. Aortic valve regurgitation is not visualized. Aortic valve sclerosis/calcification is present, without any evidence of aortic stenosis.  5. Aortic dilatation noted. There is mild dilatation of the aortic root, measuring 40 mm. FINDINGS  Left Ventricle: Left ventricular ejection fraction, by estimation, is 45 to 50%. The left ventricle has mildly decreased function. The left ventricle demonstrates global hypokinesis. The left ventricular internal cavity size was moderately dilated. There is mild left ventricular hypertrophy. Left ventricular diastolic parameters are consistent with Grade I diastolic dysfunction (impaired relaxation). Right Ventricle: The right ventricular size is normal. No increase in right ventricular wall thickness. Right ventricular systolic function is normal. Tricuspid regurgitation signal is inadequate for assessing PA pressure. Left Atrium: Left atrial size was normal in size. Right Atrium: Right atrial size was normal in size. Pericardium: There is no evidence of pericardial effusion. Mitral Valve: The mitral valve is normal in structure. Trivial mitral valve regurgitation. No evidence of mitral valve stenosis. Tricuspid Valve: The tricuspid valve is normal in structure. Tricuspid valve regurgitation is trivial. Aortic Valve: The aortic valve was not well visualized. Aortic valve regurgitation is not visualized. Aortic valve sclerosis/calcification is present, without any evidence of aortic  stenosis. Aortic valve mean gradient measures 7.7 mmHg. Aortic valve peak gradient measures 14.6 mmHg. Aortic valve area, by VTI measures 4.16 cm. Pulmonic Valve: The pulmonic valve was not well visualized. Pulmonic valve regurgitation is not visualized. Aorta: Aortic dilatation noted. There is mild dilatation of the aortic root, measuring 40 mm. IAS/Shunts: The interatrial septum was not well visualized.  LEFT VENTRICLE PLAX 2D LVIDd:  6.50 cm      Diastology LVIDs:         5.20 cm      LV e' medial:    5.55 cm/s LV PW:         1.20 cm      LV E/e' medial:  16.1 LV IVS:        1.20 cm      LV e' lateral:   8.22 cm/s LVOT diam:     2.60 cm      LV E/e' lateral: 10.8 LV SV:         149 LV SV Index:   72 LVOT Area:     5.31 cm                              3D Volume EF: LV Volumes (MOD)            3D EF:        48 % LV vol d, MOD A2C: 164.0 ml LV EDV:       262 ml LV vol d, MOD A4C: 163.0 ml LV ESV:       136 ml LV vol s, MOD A2C: 81.8 ml  LV SV:        126 ml LV vol s, MOD A4C: 84.5 ml LV SV MOD A2C:     82.2 ml LV SV MOD A4C:     163.0 ml LV SV MOD BP:      81.7 ml RIGHT VENTRICLE             IVC RV S prime:     12.40 cm/s  IVC diam: 2.50 cm TAPSE (M-mode): 2.8 cm LEFT ATRIUM             Index        RIGHT ATRIUM           Index LA diam:        3.00 cm 1.44 cm/m   RA Area:     19.10 cm LA Vol (A2C):   47.2 ml 22.69 ml/m  RA Volume:   51.90 ml  24.95 ml/m LA Vol (A4C):   32.8 ml 15.77 ml/m LA Biplane Vol: 39.6 ml 19.04 ml/m  AORTIC VALVE AV Area (Vmax):    3.86 cm AV Area (Vmean):   3.90 cm AV Area (VTI):     4.16 cm AV Vmax:           191.00 cm/s AV Vmean:          125.667 cm/s AV VTI:            0.359 m AV Peak Grad:      14.6 mmHg AV Mean Grad:      7.7 mmHg LVOT Vmax:         139.00 cm/s LVOT Vmean:        92.200 cm/s LVOT VTI:          0.281 m LVOT/AV VTI ratio: 0.78  AORTA Ao Root diam: 4.00 cm Ao Asc diam:  2.90 cm MITRAL VALVE MV Area (PHT): 3.60 cm     SHUNTS MV Decel Time: 211 msec      Systemic VTI:  0.28 m MV E velocity: 89.10 cm/s   Systemic Diam: 2.60 cm MV A velocity: 127.00 cm/s MV E/A ratio:  0.70 Oswaldo Milian MD Electronically signed by Oswaldo Milian MD Signature Date/Time: 10/09/2021/12:09:31 PM    Final (  Updated)    VAS [place] LOWER EXTREMITY VENOUS (DVT)  Result Date: 10/11/2021  Lower Venous DVT Study Patient Name:  WESTLEY SIMONELLI Mizrachi  Date of Exam:   10/11/2021 Medical Rec #: [id number]         Accession #:    [id number] Date of Birth: 1956/08/21         Patient Gender: M Patient Age:   65 years Exam Location:  [facility] Procedure:      VAS [place] LOWER EXTREMITY VENOUS (DVT) Referring Phys: Vance Gather --------------------------------------------------------------------------------  Indications: Edema.  Comparison Study: No prior study Performing Technologist: Maudry Mayhew MHA, RDMS, RVT, RDCS  Examination Guidelines: A complete evaluation includes B-mode imaging, spectral Doppler, color Doppler, and power Doppler as needed of all accessible portions of each vessel. Bilateral testing is considered an integral part of a complete examination. Limited examinations for reoccurring indications may be performed as noted. The reflux portion of the exam is performed with the patient in reverse Trendelenburg.  +---------+---------------+---------+-----------+----------+--------------+  RIGHT     Compressibility Phasicity Spontaneity Properties Thrombus Aging  +---------+---------------+---------+-----------+----------+--------------+  CFV       Full            Yes       Yes                                    +---------+---------------+---------+-----------+----------+--------------+  SFJ       Full                                                             +---------+---------------+---------+-----------+----------+--------------+  FV Prox   Full                                                              +---------+---------------+---------+-----------+----------+--------------+  FV Mid    Full                                                             +---------+---------------+---------+-----------+----------+--------------+  FV Distal Full                                                             +---------+---------------+---------+-----------+----------+--------------+  PFV       Full                                                             +---------+---------------+---------+-----------+----------+--------------+  POP  Full            Yes       Yes                                    +---------+---------------+---------+-----------+----------+--------------+  PTV       Full                                                             +---------+---------------+---------+-----------+----------+--------------+  PERO      Full                                                             +---------+---------------+---------+-----------+----------+--------------+   +----+---------------+---------+-----------+----------+--------------+  LEFT Compressibility Phasicity Spontaneity Properties Thrombus Aging  +----+---------------+---------+-----------+----------+--------------+  CFV  Full            Yes       Yes                                    +----+---------------+---------+-----------+----------+--------------+     Summary: RIGHT: - There is no evidence of deep vein thrombosis in the lower extremity.  - No cystic structure found in the popliteal fossa.  LEFT: - No evidence of common femoral vein obstruction.  *See table(s) above for measurements and observations. Electronically signed by Orlie Pollen on 10/11/2021 at 4:13:14 PM.    Final       Subjective: Feels well, getting around normally, eating well, plans to adhere to medications. No dyspnea or wheezing or chest pain. Leg swelling is improved.   Discharge Exam: Vitals:   10/12/21 0757 10/12/21 0758  BP: (!) 161/94 (!) 161/94  Pulse: 66  67  Resp: 16 16  Temp: 98.2 F (36.8 C)   SpO2: 99% 99%   General: Pt is alert, awake, not in acute distress Cardiovascular: RRR, S1/S2 +, no rubs, no gallops Respiratory: CTA bilaterally, no wheezing, no rhonchi Abdominal: Soft, NT, ND, bowel sounds + Extremities: No edema, no cyanosis  Labs: BNP (last 3 results) Recent Labs    10/08/21 2300  BNP 123456*   Basic Metabolic Panel: Recent Labs  Lab 10/08/21 2300 10/08/21 2359 10/09/21 0037 10/09/21 0316 10/09/21 0604 10/10/21 0122 10/11/21 0150  NA 135   < > 139 139 136 138 136  K 4.4   < > 4.4 3.9 3.9 4.0 4.0  CL 105  --  106  --  107 109 107  CO2 23  --   --   --  21* 22 22  GLUCOSE 225*  --  222*  --  162* 82 99  BUN 24*  --  29*  --  28* 37* 28*  CREATININE 1.80*  --  1.70*  --  2.13* 1.85* 1.52*  CALCIUM 8.9  --   --   --  8.5* 8.6* 8.8*  MG  --   --   --   --  2.6*  --   --    < > =  values in this interval not displayed.   Liver Function Tests: Recent Labs  Lab 10/08/21 2300  AST 49*  ALT 41  ALKPHOS 85  BILITOT 0.7  PROT 7.2  ALBUMIN 3.7   No results for input(s): LIPASE, AMYLASE in the last 168 hours. Recent Labs  Lab 10/09/21 0604  AMMONIA 40*   CBC: Recent Labs  Lab 10/08/21 2300 10/08/21 2359 10/09/21 0037 10/09/21 0316 10/09/21 0604 10/10/21 0122 10/11/21 0150  WBC 5.0  --   --   --  5.7 8.4 7.8  HGB 13.3   < > 15.3 11.6* 11.1* 10.4* 11.1*  HCT 43.0   < > 45.0 34.0* 34.7* 32.8* 34.9*  MCV 86.9  --   --   --  84.4 83.2 82.7  PLT 194  --   --   --  146* 135* 155   < > = values in this interval not displayed.   Cardiac Enzymes: No results for input(s): CKTOTAL, CKMB, CKMBINDEX, TROPONINI in the last 168 hours. BNP: Invalid input(s): POCBNP CBG: Recent Labs  Lab 10/11/21 1130 10/11/21 1608 10/11/21 2014 10/11/21 2336 10/12/21 0418  GLUCAP 183* 134* 100* 136* 102*   D-Dimer No results for input(s): DDIMER in the last 72 hours. Hgb A1c No results for input(s): HGBA1C in the  last 72 hours. Lipid Profile No results for input(s): CHOL, HDL, LDLCALC, TRIG, CHOLHDL, LDLDIRECT in the last 72 hours. Thyroid function studies No results for input(s): TSH, T4TOTAL, T3FREE, THYROIDAB in the last 72 hours.  Invalid input(s): FREET3 Anemia work up No results for input(s): VITAMINB12, FOLATE, FERRITIN, TIBC, IRON, RETICCTPCT in the last 72 hours. Urinalysis No results found for: COLORURINE, APPEARANCEUR, LABSPEC, [place], GLUCOSEU, [place], [place], KETONESUR, PROTEINUR, UROBILINOGEN, NITRITE, LEUKOCYTESUR  Microbiology Recent Results (from the past 240 hour(s))  Resp Panel by RT-PCR (Flu A&B, Covid) Nasopharyngeal Swab     Status: None   Collection Time: 10/08/21 11:02 PM   Specimen: Nasopharyngeal Swab; Nasopharyngeal(NP) swabs in vial transport medium  Result Value Ref Range Status   SARS Coronavirus 2 by RT PCR NEGATIVE NEGATIVE Final    Comment: (NOTE) SARS-CoV-2 target nucleic acids are NOT DETECTED.  The SARS-CoV-2 RNA is generally detectable in upper respiratory specimens during the acute phase of infection. The lowest concentration of SARS-CoV-2 viral copies this assay can detect is 138 copies/mL. A negative result does not preclude SARS-Cov-2 infection and should not be used as the sole basis for treatment or other patient management decisions. A negative result may occur with  improper specimen collection/handling, submission of specimen other than nasopharyngeal swab, presence of viral mutation(s) within the areas targeted by this assay, and inadequate number of viral copies(<138 copies/mL). A negative result must be combined with clinical observations, patient history, and epidemiological information. The expected result is Negative.  Fact Sheet for Patients:  EntrepreneurPulse.com.au  Fact Sheet for Healthcare Providers:  IncredibleEmployment.be  This test is no t yet approved or cleared by the [place]  FDA and  has been authorized for detection and/or diagnosis of SARS-CoV-2 by FDA under an Emergency Use Authorization (EUA). This EUA will remain  in effect (meaning this test can be used) for the duration of the COVID-19 declaration under Section 564(b)(1) of the Act, 21 U.S.C.section 360bbb-3(b)(1), unless the authorization is terminated  or revoked sooner.       Influenza A by PCR NEGATIVE NEGATIVE Final   Influenza B by PCR NEGATIVE NEGATIVE Final    Comment: (NOTE) The Xpert Xpress SARS-CoV-2/FLU/RSV plus  assay is intended as an aid in the diagnosis of influenza from Nasopharyngeal swab specimens and should not be used as a sole basis for treatment. Nasal washings and aspirates are unacceptable for Xpert Xpress SARS-CoV-2/FLU/RSV testing.  Fact Sheet for Patients: BloggerCourse.com  Fact Sheet for Healthcare Providers: SeriousBroker.it  This test is not yet approved or cleared by the [place] FDA and has been authorized for detection and/or diagnosis of SARS-CoV-2 by FDA under an Emergency Use Authorization (EUA). This EUA will remain in effect (meaning this test can be used) for the duration of the COVID-19 declaration under Section 564(b)(1) of the Act, 21 U.S.C. section 360bbb-3(b)(1), unless the authorization is terminated or revoked.  Performed at [facility], [address], [place], [place] [address]   MRSA Next Gen by PCR, Nasal     Status: None   Collection Time: 10/09/21  2:30 AM   Specimen: Nasal Mucosa; Nasal Swab  Result Value Ref Range Status   MRSA by PCR Next Gen NOT DETECTED NOT DETECTED Final    Comment: (NOTE) The GeneXpert MRSA Assay (FDA approved for NASAL specimens only), is one component of a comprehensive MRSA colonization surveillance program. It is not intended to diagnose MRSA infection nor to guide or monitor treatment for MRSA infections. Test performance is not FDA approved in  patients less than 75 years old. Performed at [facility], [address] [place], [place] [address]     Time coordinating discharge: Approximately 40 minutes  Tyrone Nine, MD  [facility] 10/12/2021, 8:46 AM

## 2021-10-12 NOTE — Consult Note (Signed)
Ref: Orpah Cobb, MD   Subjective:  Feeling better. Improved BP control.  Objective:  Vital Signs in the last 24 hours: Temp:  [97.6 F (36.4 C)-98.3 F (36.8 C)] 98.2 F (36.8 C) (12/22 0757) Pulse Rate:  [55-67] 67 (12/22 0758) Cardiac Rhythm: Normal sinus rhythm (12/22 0757) Resp:  [14-20] 16 (12/22 0758) BP: (136-165)/(94-118) 161/94 (12/22 0758) SpO2:  [94 %-100 %] 99 % (12/22 0758) Weight:  [85.5 kg] 85.5 kg (12/22 0450)  Physical Exam: BP Readings from Last 1 Encounters:  10/12/21 (!) 161/94     Wt Readings from Last 1 Encounters:  10/12/21 85.5 kg    Weight change: -1.092 kg Body mass index is 27.05 kg/m. HEENT: [place]/AT, Eyes-Brown, Conjunctiva-Pink, Sclera-Non-icteric Neck: No JVD, No bruit, Trachea midline. Lungs:  Clear, Bilateral. Cardiac:  Regular rhythm, normal S1 and S2, no S3. II/VI systolic murmur. Abdomen:  Soft, non-tender. BS present. Extremities:  No edema present. No cyanosis. No clubbing. CNS: AxOx3, Cranial nerves grossly intact, moves all 4 extremities.  Skin: Warm and dry.   Intake/Output from previous day: 12/21 0701 - 12/22 0700 In: 960 [P.O.:960] Out: 2675 [Urine:2675]    Lab Results: BMET    Component Value Date/Time   NA 136 10/11/2021 0150   NA 138 10/10/2021 0122   NA 136 10/09/2021 0604   K 4.0 10/11/2021 0150   K 4.0 10/10/2021 0122   K 3.9 10/09/2021 0604   CL 107 10/11/2021 0150   CL 109 10/10/2021 0122   CL 107 10/09/2021 0604   CO2 22 10/11/2021 0150   CO2 22 10/10/2021 0122   CO2 21 (L) 10/09/2021 0604   GLUCOSE 99 10/11/2021 0150   GLUCOSE 82 10/10/2021 0122   GLUCOSE 162 (H) 10/09/2021 0604   BUN 28 (H) 10/11/2021 0150   BUN 37 (H) 10/10/2021 0122   BUN 28 (H) 10/09/2021 0604   CREATININE 1.52 (H) 10/11/2021 0150   CREATININE 1.85 (H) 10/10/2021 0122   CREATININE 2.13 (H) 10/09/2021 0604   CALCIUM 8.8 (L) 10/11/2021 0150   CALCIUM 8.6 (L) 10/10/2021 0122   CALCIUM 8.5 (L) 10/09/2021 0604   GFRNONAA 51  (L) 10/11/2021 0150   GFRNONAA 40 (L) 10/10/2021 0122   GFRNONAA 34 (L) 10/09/2021 0604   CBC    Component Value Date/Time   WBC 7.8 10/11/2021 0150   RBC 4.22 10/11/2021 0150   HGB 11.1 (L) 10/11/2021 0150   HCT 34.9 (L) 10/11/2021 0150   PLT 155 10/11/2021 0150   MCV 82.7 10/11/2021 0150   MCH 26.3 10/11/2021 0150   MCHC 31.8 10/11/2021 0150   RDW 14.6 10/11/2021 0150   HEPATIC Function Panel Recent Labs    10/08/21 2300  PROT 7.2   HEMOGLOBIN A1C No components found for: HGA1C,  MPG CARDIAC ENZYMES No results found for: CKTOTAL, CKMB, CKMBINDEX, TROPONINI BNP No results for input(s): PROBNP in the last 8760 hours. TSH No results for input(s): TSH in the last 8760 hours. CHOLESTEROL No results for input(s): CHOL in the last 8760 hours.  Scheduled Meds:  amLODipine  10 mg Oral Daily   budesonide (PULMICORT) nebulizer solution  0.25 mg Nebulization BID   cloNIDine  0.2 mg Oral BID   COVID-19 mRNA bivalent vaccine (Moderna)  0.5 mL Intramuscular ONCE-1600   docusate sodium  100 mg Oral BID   empagliflozin  10 mg Oral Daily   heparin  5,000 Units Subcutaneous Q8H   hydrALAZINE  75 mg Oral Q8H   influenza vaccine adjuvanted  0.5  mL Intramuscular Tomorrow-1000   insulin aspart  0-9 Units Subcutaneous Q4H   isosorbide mononitrate  120 mg Oral Daily   metoprolol tartrate  25 mg Oral BID   nicotine  14 mg Transdermal Daily   pneumococcal 23 valent vaccine  0.5 mL Intramuscular Tomorrow-1000   predniSONE  40 mg Oral QAC breakfast   Continuous Infusions:  sodium chloride Stopped (10/09/21 1548)   cefTRIAXone (ROCEPHIN)  IV 1 g (10/11/21 1110)   PRN Meds:.sodium chloride, albuterol, hydrALAZINE  Assessment/Plan:  Acute diastolic left heart failure Dilated cardiomyopathy HTN Type 2 DM Obesity  Plan: Patient agrees to medication compliance. F/U in 1 week   LOS: 3 days   Time spent including chart review, lab review, examination, discussion with patient/Nurse  : 30 min   Orpah Cobb  MD  10/12/2021, 10:16 AM

## 2021-10-12 NOTE — Progress Notes (Signed)
RN provided patient with verbal discharge instructions. Paper copy of discharge summary provided to patient. RN answered all questions. VSS at discharge. IV removed. Pt belongings sent with patient at discharge. RN d/c patient via wheelchair to private vehicle through employee entrance.

## 2021-10-13 ENCOUNTER — Encounter (HOSPITAL_COMMUNITY): Payer: Self-pay | Admitting: Orthopedic Surgery

## 2022-01-07 ENCOUNTER — Emergency Department (HOSPITAL_COMMUNITY): Payer: Medicare Other

## 2022-01-07 ENCOUNTER — Other Ambulatory Visit: Payer: Self-pay

## 2022-01-07 ENCOUNTER — Inpatient Hospital Stay (HOSPITAL_COMMUNITY)
Admission: EM | Admit: 2022-01-07 | Discharge: 2022-01-10 | DRG: 208 | Disposition: A | Discharge status: Home / Self Care | Payer: Medicare Other | Attending: Internal Medicine | Admitting: Internal Medicine

## 2022-01-07 DIAGNOSIS — J441 Chronic obstructive pulmonary disease with (acute) exacerbation: Secondary | ICD-10-CM | POA: Diagnosis present

## 2022-01-07 DIAGNOSIS — N1832 Chronic kidney disease, stage 3b: Secondary | ICD-10-CM | POA: Diagnosis present

## 2022-01-07 DIAGNOSIS — Z79899 Other long term (current) drug therapy: Secondary | ICD-10-CM

## 2022-01-07 DIAGNOSIS — J9602 Acute respiratory failure with hypercapnia: Secondary | ICD-10-CM | POA: Diagnosis present

## 2022-01-07 DIAGNOSIS — Z20822 Contact with and (suspected) exposure to covid-19: Secondary | ICD-10-CM | POA: Diagnosis present

## 2022-01-07 DIAGNOSIS — N179 Acute kidney failure, unspecified: Secondary | ICD-10-CM | POA: Diagnosis present

## 2022-01-07 DIAGNOSIS — F17210 Nicotine dependence, cigarettes, uncomplicated: Secondary | ICD-10-CM | POA: Diagnosis present

## 2022-01-07 DIAGNOSIS — E875 Hyperkalemia: Secondary | ICD-10-CM | POA: Diagnosis present

## 2022-01-07 DIAGNOSIS — E785 Hyperlipidemia, unspecified: Secondary | ICD-10-CM | POA: Diagnosis present

## 2022-01-07 DIAGNOSIS — Z781 Physical restraint status: Secondary | ICD-10-CM

## 2022-01-07 DIAGNOSIS — I5023 Acute on chronic systolic (congestive) heart failure: Secondary | ICD-10-CM | POA: Diagnosis present

## 2022-01-07 DIAGNOSIS — J440 Chronic obstructive pulmonary disease with acute lower respiratory infection: Secondary | ICD-10-CM | POA: Diagnosis present

## 2022-01-07 DIAGNOSIS — E119 Type 2 diabetes mellitus without complications: Secondary | ICD-10-CM | POA: Diagnosis not present

## 2022-01-07 DIAGNOSIS — J9690 Respiratory failure, unspecified, unspecified whether with hypoxia or hypercapnia: Secondary | ICD-10-CM | POA: Diagnosis present

## 2022-01-07 DIAGNOSIS — I130 Hypertensive heart and chronic kidney disease with heart failure and stage 1 through stage 4 chronic kidney disease, or unspecified chronic kidney disease: Secondary | ICD-10-CM | POA: Diagnosis present

## 2022-01-07 DIAGNOSIS — E1122 Type 2 diabetes mellitus with diabetic chronic kidney disease: Secondary | ICD-10-CM | POA: Diagnosis present

## 2022-01-07 DIAGNOSIS — I890 Lymphedema, not elsewhere classified: Secondary | ICD-10-CM | POA: Diagnosis present

## 2022-01-07 DIAGNOSIS — J189 Pneumonia, unspecified organism: Principal | ICD-10-CM | POA: Diagnosis present

## 2022-01-07 DIAGNOSIS — J9601 Acute respiratory failure with hypoxia: Secondary | ICD-10-CM | POA: Diagnosis present

## 2022-01-07 DIAGNOSIS — I10 Essential (primary) hypertension: Secondary | ICD-10-CM

## 2022-01-07 DIAGNOSIS — G9340 Encephalopathy, unspecified: Secondary | ICD-10-CM | POA: Diagnosis present

## 2022-01-07 LAB — I-STAT ARTERIAL BLOOD GAS, ED
Acid-base deficit: 1 mmol/L (ref 0.0–2.0)
Acid-base deficit: 3 mmol/L — ABNORMAL HIGH (ref 0.0–2.0)
Acid-base deficit: 2 mmol/L (ref 0.0–2.0)
Bicarbonate: 28.2 mmol/L — ABNORMAL HIGH (ref 20.0–28.0)
Bicarbonate: 28.8 mmol/L — ABNORMAL HIGH (ref 20.0–28.0)
Bicarbonate: 25.6 mmol/L (ref 20.0–28.0)
Calcium, Ion: 1.29 mmol/L (ref 1.15–1.40)
Calcium, Ion: 1.31 mmol/L (ref 1.15–1.40)
Calcium, Ion: 1.28 mmol/L (ref 1.15–1.40)
HCT: 36 % — ABNORMAL LOW (ref 39.0–52.0)
HCT: 37 % — ABNORMAL LOW (ref 39.0–52.0)
HCT: 34 % — ABNORMAL LOW (ref 39.0–52.0)
Hemoglobin: 12.2 g/dL — ABNORMAL LOW (ref 13.0–17.0)
Hemoglobin: 12.6 g/dL — ABNORMAL LOW (ref 13.0–17.0)
Hemoglobin: 11.6 g/dL — ABNORMAL LOW (ref 13.0–17.0)
O2 Saturation: 100 %
O2 Saturation: 100 %
O2 Saturation: 92 %
Patient temperature: 98
Patient temperature: 98.6
Patient temperature: 98.6
Potassium: 4.7 mmol/L (ref 3.5–5.1)
Potassium: 4.9 mmol/L (ref 3.5–5.1)
Potassium: 5 mmol/L (ref 3.5–5.1)
Sodium: 140 mmol/L (ref 135–145)
Sodium: 141 mmol/L (ref 135–145)
Sodium: 141 mmol/L (ref 135–145)
TCO2: 30 mmol/L (ref 22–32)
TCO2: 32 mmol/L (ref 22–32)
TCO2: 27 mmol/L (ref 22–32)
pCO2 arterial: 68.9 mmHg (ref 32–48)
pCO2 arterial: 89.2 mmHg (ref 32–48)
pCO2 arterial: 58.3 mmHg — ABNORMAL HIGH (ref 32–48)
pH, Arterial: 7.117 — CL (ref 7.35–7.45)
pH, Arterial: 7.219 — ABNORMAL LOW (ref 7.35–7.45)
pH, Arterial: 7.251 — ABNORMAL LOW (ref 7.35–7.45)
pO2, Arterial: 240 mmHg — ABNORMAL HIGH (ref 83–108)
pO2, Arterial: 275 mmHg — ABNORMAL HIGH (ref 83–108)
pO2, Arterial: 74 mmHg — ABNORMAL LOW (ref 83–108)

## 2022-01-07 LAB — CBC WITH DIFFERENTIAL/PLATELET
Abs Immature Granulocytes: 0.02 10*3/uL (ref 0.00–0.07)
Basophils Absolute: 0.1 10*3/uL (ref 0.0–0.1)
Basophils Relative: 1 %
Eosinophils Absolute: 0 10*3/uL (ref 0.0–0.5)
Eosinophils Relative: 1 %
HCT: 40.9 % (ref 39.0–52.0)
Hemoglobin: 12.4 g/dL — ABNORMAL LOW (ref 13.0–17.0)
Immature Granulocytes: 0 %
Lymphocytes Relative: 30 %
Lymphs Abs: 2.1 10*3/uL (ref 0.7–4.0)
MCH: 27.4 pg (ref 26.0–34.0)
MCHC: 30.3 g/dL (ref 30.0–36.0)
MCV: 90.5 fL (ref 80.0–100.0)
Monocytes Absolute: 0.5 10*3/uL (ref 0.1–1.0)
Monocytes Relative: 7 %
Neutro Abs: 4.3 10*3/uL (ref 1.7–7.7)
Neutrophils Relative %: 61 %
Platelets: 210 10*3/uL (ref 150–400)
RBC: 4.52 MIL/uL (ref 4.22–5.81)
RDW: 13.6 % (ref 11.5–15.5)
WBC: 6.9 10*3/uL (ref 4.0–10.5)
nRBC: 0 % (ref 0.0–0.2)

## 2022-01-07 LAB — MAGNESIUM
Magnesium: 2.4 mg/dL (ref 1.7–2.4)
Magnesium: 2.4 mg/dL (ref 1.7–2.4)

## 2022-01-07 LAB — POCT I-STAT 7, (LYTES, BLD GAS, ICA,H+H)
Acid-Base Excess: 0 mmol/L (ref 0.0–2.0)
Bicarbonate: 26.5 mmol/L (ref 20.0–28.0)
Calcium, Ion: 1.25 mmol/L (ref 1.15–1.40)
HCT: 33 % — ABNORMAL LOW (ref 39.0–52.0)
Hemoglobin: 11.2 g/dL — ABNORMAL LOW (ref 13.0–17.0)
O2 Saturation: 98 %
Potassium: 4.5 mmol/L (ref 3.5–5.1)
Sodium: 140 mmol/L (ref 135–145)
TCO2: 28 mmol/L (ref 22–32)
pCO2 arterial: 49.9 mmHg — ABNORMAL HIGH (ref 32–48)
pH, Arterial: 7.333 — ABNORMAL LOW (ref 7.35–7.45)
pO2, Arterial: 106 mmHg (ref 83–108)

## 2022-01-07 LAB — GLUCOSE, CAPILLARY
Glucose-Capillary: 113 mg/dL — ABNORMAL HIGH (ref 70–99)
Glucose-Capillary: 124 mg/dL — ABNORMAL HIGH (ref 70–99)
Glucose-Capillary: 147 mg/dL — ABNORMAL HIGH (ref 70–99)
Glucose-Capillary: 158 mg/dL — ABNORMAL HIGH (ref 70–99)
Glucose-Capillary: 187 mg/dL — ABNORMAL HIGH (ref 70–99)

## 2022-01-07 LAB — STREP PNEUMONIAE URINARY ANTIGEN: Strep Pneumo Urinary Antigen: NEGATIVE

## 2022-01-07 LAB — MRSA NEXT GEN BY PCR, NASAL: MRSA by PCR Next Gen: NOT DETECTED

## 2022-01-07 LAB — BASIC METABOLIC PANEL
Anion gap: 11 (ref 5–15)
Anion gap: 8 (ref 5–15)
BUN: 22 mg/dL (ref 8–23)
BUN: 24 mg/dL — ABNORMAL HIGH (ref 8–23)
CO2: 21 mmol/L — ABNORMAL LOW (ref 22–32)
CO2: 22 mmol/L (ref 22–32)
Calcium: 9 mg/dL (ref 8.9–10.3)
Calcium: 8.3 mg/dL — ABNORMAL LOW (ref 8.9–10.3)
Chloride: 106 mmol/L (ref 98–111)
Chloride: 106 mmol/L (ref 98–111)
Creatinine, Ser: 1.7 mg/dL — ABNORMAL HIGH (ref 0.61–1.24)
Creatinine, Ser: 1.66 mg/dL — ABNORMAL HIGH (ref 0.61–1.24)
GFR, Estimated: 44 mL/min — ABNORMAL LOW (ref >60)
GFR, Estimated: 45 mL/min — ABNORMAL LOW (ref >60)
Glucose, Bld: 160 mg/dL — ABNORMAL HIGH (ref 70–99)
Glucose, Bld: 131 mg/dL — ABNORMAL HIGH (ref 70–99)
Potassium: 4.7 mmol/L (ref 3.5–5.1)
Potassium: 5.2 mmol/L — ABNORMAL HIGH (ref 3.5–5.1)
Sodium: 138 mmol/L (ref 135–145)
Sodium: 136 mmol/L (ref 135–145)

## 2022-01-07 LAB — CBG MONITORING, ED: Glucose-Capillary: 154 mg/dL — ABNORMAL HIGH (ref 70–99)

## 2022-01-07 LAB — RESP PANEL BY RT-PCR (FLU A&B, COVID) ARPGX2
Influenza A by PCR: NEGATIVE
Influenza B by PCR: NEGATIVE
SARS Coronavirus 2 by RT PCR: NEGATIVE

## 2022-01-07 LAB — BRAIN NATRIURETIC PEPTIDE: B Natriuretic Peptide: 302.1 pg/mL — ABNORMAL HIGH (ref 0.0–100.0)

## 2022-01-07 LAB — LACTIC ACID, PLASMA: Lactic Acid, Venous: 1.6 mmol/L (ref 0.5–1.9)

## 2022-01-07 LAB — HEMOGLOBIN A1C
Hgb A1c MFr Bld: 7 % — ABNORMAL HIGH (ref 4.8–5.6)
Mean Plasma Glucose: 154.2 mg/dL

## 2022-01-07 LAB — PHOSPHORUS
Phosphorus: 5.7 mg/dL — ABNORMAL HIGH (ref 2.5–4.6)
Phosphorus: 5 mg/dL — ABNORMAL HIGH (ref 2.5–4.6)

## 2022-01-07 LAB — TROPONIN I (HIGH SENSITIVITY)
Troponin I (High Sensitivity): 48 ng/L — ABNORMAL HIGH (ref <18)
Troponin I (High Sensitivity): 59 ng/L — ABNORMAL HIGH (ref <18)

## 2022-01-07 LAB — HIV ANTIBODY (ROUTINE TESTING W REFLEX): HIV Screen 4th Generation wRfx: NONREACTIVE

## 2022-01-07 MED ORDER — INSULIN ASPART 100 UNIT/ML IJ SOLN
0.0000 [IU] | INTRAMUSCULAR | Status: DC
  Administered 2022-01-07: 1 [IU] via SUBCUTANEOUS
  Administered 2022-01-07 (×2): 2 [IU] via SUBCUTANEOUS
  Administered 2022-01-07 – 2022-01-08 (×3): 1 [IU] via SUBCUTANEOUS

## 2022-01-07 MED ORDER — ALBUTEROL SULFATE (2.5 MG/3ML) 0.083% IN NEBU
5.0000 mg | INHALATION_SOLUTION | Freq: Once | RESPIRATORY_TRACT | Status: AC
Start: 2022-01-07 — End: 2022-01-07
  Administered 2022-01-07: 5 mg via RESPIRATORY_TRACT

## 2022-01-07 MED ORDER — POLYETHYLENE GLYCOL 3350 17 G PO PACK
17.0000 g | PACK | Freq: Every day | ORAL | Status: DC
  Administered 2022-01-07 – 2022-01-08 (×2): 17 g
  Filled 2022-01-07 (×2): qty 1

## 2022-01-07 MED ORDER — PROPOFOL 1000 MG/100ML IV EMUL
5.0000 ug/kg/min | INTRAVENOUS | Status: DC
  Administered 2022-01-07 – 2022-01-08 (×3): 20 ug/kg/min via INTRAVENOUS
  Filled 2022-01-07 (×4): qty 100

## 2022-01-07 MED ORDER — PROPOFOL 1000 MG/100ML IV EMUL
INTRAVENOUS | Status: AC
  Administered 2022-01-07: 20 ug
  Filled 2022-01-07: qty 100

## 2022-01-07 MED ORDER — PROSOURCE TF PO LIQD
45.0000 mL | Freq: Two times a day (BID) | ORAL | Status: DC
  Administered 2022-01-07 – 2022-01-08 (×3): 45 mL
  Filled 2022-01-07 (×2): qty 45

## 2022-01-07 MED ORDER — ASPIRIN 81 MG PO CHEW
81.0000 mg | CHEWABLE_TABLET | Freq: Every day | ORAL | Status: DC
  Administered 2022-01-07 – 2022-01-08 (×2): 81 mg
  Filled 2022-01-07 (×2): qty 1

## 2022-01-07 MED ORDER — FENTANYL CITRATE PF 50 MCG/ML IJ SOSY: 25.0000 ug | PREFILLED_SYRINGE | Freq: Once | INTRAMUSCULAR | Status: DC

## 2022-01-07 MED ORDER — HEPARIN SODIUM (PORCINE) 5000 UNIT/ML IJ SOLN
5000.0000 [IU] | Freq: Three times a day (TID) | INTRAMUSCULAR | Status: DC
  Administered 2022-01-07 – 2022-01-10 (×10): 5000 [IU] via SUBCUTANEOUS
  Filled 2022-01-07 (×10): qty 1

## 2022-01-07 MED ORDER — FENTANYL BOLUS VIA INFUSION
25.0000 ug | INTRAVENOUS | Status: DC | PRN
  Administered 2022-01-07: 100 ug via INTRAVENOUS
  Filled 2022-01-07: qty 100

## 2022-01-07 MED ORDER — TORSEMIDE 20 MG PO TABS
20.0000 mg | ORAL_TABLET | Freq: Every day | ORAL | Status: DC
  Administered 2022-01-07: 20 mg
  Filled 2022-01-07: qty 1

## 2022-01-07 MED ORDER — IPRATROPIUM-ALBUTEROL 0.5-2.5 (3) MG/3ML IN SOLN
3.0000 mL | Freq: Four times a day (QID) | RESPIRATORY_TRACT | Status: DC
  Administered 2022-01-07: 3 mL via RESPIRATORY_TRACT
  Filled 2022-01-07: qty 3

## 2022-01-07 MED ORDER — MIDAZOLAM HCL 2 MG/2ML IJ SOLN
2.0000 mg | Freq: Once | INTRAMUSCULAR | Status: AC
  Administered 2022-01-07: 2 mg via INTRAVENOUS
  Filled 2022-01-07: qty 2

## 2022-01-07 MED ORDER — HYDRALAZINE HCL 50 MG PO TABS: 50.0000 mg | ORAL_TABLET | Freq: Three times a day (TID) | ORAL | Status: DC

## 2022-01-07 MED ORDER — ALBUTEROL SULFATE (2.5 MG/3ML) 0.083% IN NEBU
10.0000 mg/h | INHALATION_SOLUTION | Freq: Once | RESPIRATORY_TRACT | Status: AC
  Administered 2022-01-07: 10 mg/h via RESPIRATORY_TRACT

## 2022-01-07 MED ORDER — POLYETHYLENE GLYCOL 3350 17 G PO PACK: 17.0000 g | PACK | Freq: Every day | ORAL | Status: DC | PRN

## 2022-01-07 MED ORDER — ARFORMOTEROL TARTRATE 15 MCG/2ML IN NEBU
15.0000 ug | INHALATION_SOLUTION | Freq: Two times a day (BID) | RESPIRATORY_TRACT | Status: DC
  Administered 2022-01-07 – 2022-01-10 (×7): 15 ug via RESPIRATORY_TRACT
  Filled 2022-01-07 (×8): qty 2

## 2022-01-07 MED ORDER — REVEFENACIN 175 MCG/3ML IN SOLN
175.0000 ug | Freq: Every day | RESPIRATORY_TRACT | Status: DC
  Administered 2022-01-08 – 2022-01-10 (×3): 175 ug via RESPIRATORY_TRACT
  Filled 2022-01-07 (×3): qty 3

## 2022-01-07 MED ORDER — CEFTRIAXONE SODIUM 1 G IJ SOLR
1.0000 g | Freq: Once | INTRAMUSCULAR | Status: AC
Start: 2022-01-07 — End: 2022-01-07
  Administered 2022-01-07: 1 g via INTRAVENOUS
  Filled 2022-01-07: qty 10

## 2022-01-07 MED ORDER — SODIUM CHLORIDE 0.9 % IV SOLN
500.0000 mg | INTRAVENOUS | Status: AC
  Administered 2022-01-07 – 2022-01-09 (×3): 500 mg via INTRAVENOUS
  Filled 2022-01-07 (×3): qty 5

## 2022-01-07 MED ORDER — VITAL HIGH PROTEIN PO LIQD
1000.0000 mL | ORAL | Status: DC
  Administered 2022-01-07: 1000 mL

## 2022-01-07 MED ORDER — MAGNESIUM SULFATE 2 GM/50ML IV SOLN
2.0000 g | Freq: Once | INTRAVENOUS | Status: AC
  Administered 2022-01-07: 2 g via INTRAVENOUS
  Filled 2022-01-07: qty 50

## 2022-01-07 MED ORDER — PANTOPRAZOLE 2 MG/ML SUSPENSION
40.0000 mg | Freq: Every day | ORAL | Status: DC
  Administered 2022-01-07 – 2022-01-08 (×2): 40 mg
  Filled 2022-01-07 (×2): qty 20

## 2022-01-07 MED ORDER — ORAL CARE MOUTH RINSE
15.0000 mL | OROMUCOSAL | Status: DC
  Administered 2022-01-07 – 2022-01-08 (×13): 15 mL via OROMUCOSAL

## 2022-01-07 MED ORDER — METHYLPREDNISOLONE SODIUM SUCC 125 MG IJ SOLR
125.0000 mg | Freq: Once | INTRAMUSCULAR | Status: AC
  Administered 2022-01-07: 125 mg via INTRAVENOUS
  Filled 2022-01-07: qty 2

## 2022-01-07 MED ORDER — DOCUSATE SODIUM 50 MG/5ML PO LIQD
100.0000 mg | Freq: Two times a day (BID) | ORAL | Status: DC
  Administered 2022-01-07 – 2022-01-08 (×3): 100 mg
  Filled 2022-01-07 (×3): qty 10

## 2022-01-07 MED ORDER — FUROSEMIDE 10 MG/ML IJ SOLN
40.0000 mg | INTRAMUSCULAR | Status: AC
  Administered 2022-01-07: 40 mg via INTRAVENOUS
  Filled 2022-01-07: qty 4

## 2022-01-07 MED ORDER — FENTANYL 2500MCG IN NS 250ML (10MCG/ML) PREMIX INFUSION
25.0000 ug/h | INTRAVENOUS | Status: DC
  Administered 2022-01-07: 25 ug/h via INTRAVENOUS
  Administered 2022-01-08: 100 ug/h via INTRAVENOUS
  Filled 2022-01-07 (×2): qty 250

## 2022-01-07 MED ORDER — ETOMIDATE 2 MG/ML IV SOLN
INTRAVENOUS | Status: AC | PRN
  Administered 2022-01-07: 20 mg via INTRAVENOUS

## 2022-01-07 MED ORDER — DOCUSATE SODIUM 50 MG/5ML PO LIQD: 100.0000 mg | Freq: Two times a day (BID) | ORAL | Status: DC | PRN

## 2022-01-07 MED ORDER — IPRATROPIUM-ALBUTEROL 0.5-2.5 (3) MG/3ML IN SOLN
3.0000 mL | RESPIRATORY_TRACT | Status: DC | PRN
  Administered 2022-01-07 – 2022-01-08 (×4): 3 mL via RESPIRATORY_TRACT
  Filled 2022-01-07 (×4): qty 3

## 2022-01-07 MED ORDER — METOPROLOL TARTRATE 12.5 MG HALF TABLET: 12.5000 mg | ORAL_TABLET | Freq: Two times a day (BID) | ORAL | Status: DC

## 2022-01-07 MED ORDER — METHYLPREDNISOLONE SODIUM SUCC 125 MG IJ SOLR
80.0000 mg | INTRAMUSCULAR | Status: DC
  Administered 2022-01-07: 80 mg via INTRAVENOUS
  Filled 2022-01-07 (×2): qty 2

## 2022-01-07 MED ORDER — CHLORHEXIDINE GLUCONATE 0.12% ORAL RINSE (MEDLINE KIT)
15.0000 mL | Freq: Two times a day (BID) | OROMUCOSAL | Status: DC
  Administered 2022-01-07 – 2022-01-08 (×3): 15 mL via OROMUCOSAL

## 2022-01-07 MED ORDER — ROCURONIUM BROMIDE 50 MG/5ML IV SOLN
INTRAVENOUS | Status: AC | PRN
  Administered 2022-01-07: 100 mg via INTRAVENOUS

## 2022-01-07 MED ORDER — CHLORHEXIDINE GLUCONATE CLOTH 2 % EX PADS
6.0000 | MEDICATED_PAD | Freq: Every day | CUTANEOUS | Status: DC
  Administered 2022-01-07 – 2022-01-09 (×3): 6 via TOPICAL

## 2022-01-07 MED ORDER — IPRATROPIUM BROMIDE 0.02 % IN SOLN
0.5000 mg | Freq: Once | RESPIRATORY_TRACT | Status: AC
  Administered 2022-01-07: 0.5 mg via RESPIRATORY_TRACT

## 2022-01-07 NOTE — Progress Notes (Signed)
Pt transported by RN and RT from ED RESUS to CT2 w/ no complications ?

## 2022-01-07 NOTE — H&P (Deleted)
? ?NAME:  Phillip Kennedy, MRN:  [id number], DOB:  [birth year], LOS: 0 ?ADMISSION DATE:  01/07/2022, CONSULTATION DATE:  01/07/22 ?REFERRING MD:  Palumbo, CHIEF COMPLAINT:  respiratory failure  ? ?History of Present Illness:  ?Phillip Kennedy is a 66 y.o. M with PMH significant for COPD stage 0 followed by Dr. Sherene Sires, current 1ppd smoker, HFrEF, CKD stage 3b, HTN, HL, DM who presented to the ED agitated and in respiratory distress and was intubated on arrival. Pt's spouse Phillip Kennedy is at the bedside and states that he has been more short of breath for the last couple of days.  He tried to mow the lawn the morning of 3/18 and later that day became increasingly SOB and got in the car to go to the ED, but then called EMS. No recent fever, mentioned CP or worsening LE edema. On arrival, pt was thrashing with non-rebreather so was intubated.   ? ?Work-up in the ED significant for hypercapnia with pH 7.1 pCO2 89, CXR with R lung infiltrate, CTH negative, creatinine 1.7.  He was given Lasix, Solumedrol, Ceftriaxone and Azithromycin and PCCM consulted for admission ? ? ?Pertinent  Medical History  ?COPD stage 0 followed by Dr. Sherene Sires, current 1ppd smoker, HFrEF, CKD stage 3b, HTN, HL, DM ? ?Significant Hospital Events: ?Including procedures, antibiotic start and stop dates in addition to other pertinent events   ?3/19 presented with agitation and hypercarbic respiratory failure, intubated  ? ?Interim History / Subjective:  ?NAEON, vent weaned, Cr at baseline ? ?Objective   ?Blood pressure (!) 128/94, pulse 63, temperature (!) 97.3 ?F (36.3 ?C), resp. rate (!) 30, height 5\' 11"  (1.803 m), weight 98.2 kg, SpO2 100 %. ?   ?Vent Mode: PRVC ?FiO2 (%):  [40 %-100 %] 40 % ?Set Rate:  [25 bmp-30 bmp] 30 bmp ?Vt Set:  [500 mL-600 mL] 520 mL ?PEEP:  [5 cmH20] 5 cmH20 ?Plateau Pressure:  [14 cmH20-22 cmH20] 14 cmH20  ? ?Intake/Output Summary (Last 24 hours) at 01/07/2022 0846 ?Last data filed at 01/07/2022 0700 ?Gross per 24 hour  ?Intake 449.1 ml   ?Output 1275 ml  ?Net -825.9 ml  ? ? ?Filed Weights  ? 01/07/22 0500  ?Weight: 98.2 kg  ? ? ?General:  critically ill-appearing M, intubated and sedated, agitated ?HEENT: MM pink/moist, sclera injected, pupils equal, ETT in place ?Neuro: examined on Propofol and fentanyl, RASS -3 ?CV: s1s2 rrr, no m/r/g ?PULM:  rhonchi and scattered expiratory wheezing bilaterally  ?GI: soft, bsx4 active  ?Extremities: warm/dry, RLE 2+ edema  ?Skin: no rashes or lesions ? ? ?Resolved Hospital Problem list   ? ? ?Assessment & Plan:  ? ?Acute Hypercarbic Respiratory Failure secondary to acute Reactive airways exacerbation (no fixed obs 2022 PFT) vs PNA vs Pulmonary edema ?Acute encephalopathy ?-continue solumedrol ?-CAP coverage with Ceftriaxone and Azithromycin ?-blood and respiratory cultures ?-scheduled duonebs ?-urine strep and legionella pending ?-Maintain full vent support with SAT/SBT as tolerated ?-titrate Vent setting to maintain SpO2 greater than or equal to 90%. ?-PRN duonebs, start scheduled brovana/yupelri ? ?CKD stage 3b ?Creatinine at baseline ?-monitor renal function, UOP and electrolytes, renally dose medications ? ?Chronic Lymphedema ?-RLE, dopplers negative last admission ? ?HTN, HL, chronic HFrEF ?Last echo 12/22 EF 45-50% and stage 1 diastolic dysfunction ?-Resume metoprolol (12.5 BID lower than home dose limited by HR), hydralazine 50 mg TID (100 mg TID home dose), hold clonidine, IMDUR for now resume as needed ?-Continue Asa, home torsemide 20 mg daily ? ?DM ?-SSI ? ? ?Best  Practice (right click and "Reselect all SmartList Selections" daily)  ? ?Diet/type: tubefeeds ?DVT prophylaxis: prophylactic heparin  ?GI prophylaxis: PPI ?Lines: N/A ?Foley:  Yes, and it is still needed ?Code Status:  full code ?Last date of multidisciplinary goals of care discussion [wife updated at the bedside] ? ?Labs   ?CBC: ?Recent Labs  ?Lab 01/07/22 ?0249 01/07/22 ?0309 01/07/22 ?0327 01/07/22 ?0440  ?WBC  --  6.9  --   --    ?NEUTROABS  --  4.3  --   --   ?HGB 12.6* 12.4* 12.2* 11.6*  ?HCT 37.0* 40.9 36.0* 34.0*  ?MCV  --  90.5  --   --   ?PLT  --  210  --   --   ? ? ? ?Basic Metabolic Panel: ?Recent Labs  ?Lab 01/07/22 ?0249 01/07/22 ?0309 01/07/22 ?0327 01/07/22 ?0421 01/07/22 ?0440  ?NA 140 138 141 136 141  ?K 4.7 4.7 4.9 5.2* 5.0  ?CL  --  106  --  106  --   ?CO2  --  21*  --  22  --   ?GLUCOSE  --  160*  --  131*  --   ?BUN  --  22  --  24*  --   ?CREATININE  --  1.70*  --  1.66*  --   ?CALCIUM  --  9.0  --  8.3*  --   ?MG  --   --   --  2.4  --   ?PHOS  --   --   --  5.7*  --   ? ? ?GFR: ?Estimated Creatinine Clearance: 53 mL/min (A) (by C-G formula based on SCr of 1.66 mg/dL (H)). ?Recent Labs  ?Lab 01/07/22 ?0309  ?WBC 6.9  ?LATICACIDVEN 1.6  ? ? ? ?Liver Function Tests: ?No results for input(s): AST, ALT, ALKPHOS, BILITOT, PROT, ALBUMIN in the last 168 hours. ?No results for input(s): LIPASE, AMYLASE in the last 168 hours. ?No results for input(s): AMMONIA in the last 168 hours. ? ?ABG ?   ?Component Value Date/Time  ? PHART 7.251 (L) 01/07/2022 0440  ? PCO2ART 58.3 (H) 01/07/2022 0440  ? PO2ART 74 (L) 01/07/2022 0440  ? HCO3 25.6 01/07/2022 0440  ? TCO2 27 01/07/2022 0440  ? ACIDBASEDEF 2.0 01/07/2022 0440  ? O2SAT 92 01/07/2022 0440  ? ?  ? ?Coagulation Profile: ?No results for input(s): INR, PROTIME in the last 168 hours. ? ?Cardiac Enzymes: ?No results for input(s): CKTOTAL, CKMB, CKMBINDEX, TROPONINI in the last 168 hours. ? ?HbA1C: ?No results found for: HGBA1C ? ?CBG: ?Recent Labs  ?Lab 01/07/22 ?0211 01/07/22 ?[id number]  ?GLUCAP 154* 187*  ? ? ? ?Review of Systems:   ?Unable to obtain ? ?Past Medical History:  ?He,  has no past medical history on file.  ? ?Surgical History:  ? ? ?Social History:  ?   ? ?Family History:  ?His family history is not on file.  ? ?Allergies ?No Known Allergies  ? ?Home Medications  ?Prior to Admission medications   ?Not on File  ?  ? ?Critical care time:  ?  ? ? ?CRITICAL CARE ?Performed by:  Bonna Gains Mitesh Rosendahl ? ? ?Total critical care time: 45 minutes ? ?Critical care time was exclusive of separately billable procedures and treating other patients. ? ?Critical care was necessary to treat or prevent imminent or life-threatening deterioration. ? ?Critical care was time spent personally by me on the following activities: development of treatment plan with patient and/or surrogate as  well as nursing, discussions with consultants, evaluation of patient's response to treatment, examination of patient, obtaining history from patient or surrogate, ordering and performing treatments and interventions, ordering and review of laboratory studies, ordering and review of radiographic studies, pulse oximetry and re-evaluation of patient's condition. ? ?Lanier Clam, MD ?See Shea Evans for contact info ?If no response please call [phone] until 7pm ?After 7:00 pm call Elink  [phone] ? ? ?

## 2022-01-07 NOTE — ED Notes (Signed)
Admitting provider at the bedside.

## 2022-01-07 NOTE — H&P (Addendum)
? ?NAME:  Phillip Kennedy, MRN:  [id number], DOB:  [birth year], LOS: 0 ?ADMISSION DATE:  01/07/2022, CONSULTATION DATE:  01/07/22 ?REFERRING MD:  Palumbo, CHIEF COMPLAINT:  respiratory failure  ? ?History of Present Illness:  ?Phillip Kennedy is a 66 y.o. M with PMH significant for COPD stage 0 followed by Dr. Melvyn Novas, current 1ppd smoker, HFrEF, CKD stage 3b, HTN, HL, DM who presented to the ED agitated and in respiratory distress and was intubated on arrival. Pt's spouse Phillip Kennedy is at the bedside and states that he has been more short of breath for the last couple of days.  He tried to mow the lawn the morning of 3/18 and later that day became increasingly SOB and got in the car to go to the ED, but then called EMS. No recent fever, mentioned CP or worsening LE edema. On arrival, pt was thrashing with non-rebreather so was intubated.   ? ?Work-up in the ED significant for hypercapnia with pH 7.1 pCO2 89, CXR with R lung infiltrate, CTH negative, creatinine 1.7.  He was given Lasix, Solumedrol, Ceftriaxone and Azithromycin and PCCM consulted for admission ? ? ?Pertinent  Medical History  ?COPD stage 0 followed by Dr. Melvyn Novas, current 1ppd smoker, HFrEF, CKD stage 3b, HTN, HL, DM ? ?Significant Hospital Events: ?Including procedures, antibiotic start and stop dates in addition to other pertinent events   ?3/19 presented with agitation and hypercarbic respiratory failure, intubated  ? ?Interim History / Subjective:  ?As above ? ?Objective   ?Blood pressure (!) 180/137, pulse 95, temperature 97.9 ?F (36.6 ?C), resp. rate 15, height 5\' 11"  (1.803 m), SpO2 96 %. ?   ?Vent Mode: PRVC ?FiO2 (%):  [50 %-100 %] 50 % ?Set Rate:  [25 bmp-30 bmp] 30 bmp ?Vt Set:  [500 mL-600 mL] 600 mL ?PEEP:  [5 cmH20] 5 cmH20 ?Plateau Pressure:  [22 cmH20] 22 cmH20  ? ?Intake/Output Summary (Last 24 hours) at 01/07/2022 0421 ?Last data filed at 01/07/2022 0354 ?Gross per 24 hour  ?Intake 100 ml  ?Output --  ?Net 100 ml  ? ?There were no vitals filed for  this visit. ? ?General:  critically ill-appearing M, intubated and sedated, agitated ?HEENT: MM pink/moist, sclera injected, pupils equal, ETT in place ?Neuro: examined on Propofol 36mcg, agitated and dyssynchronous with vent, not following commands ?CV: s1s2 rrr, no m/r/g ?PULM:  rhonchi and scattered expiratory wheezing bilaterally  ?GI: soft, bsx4 active  ?Extremities: warm/dry, RLE 2+ edema  ?Skin: no rashes or lesions ? ? ?Resolved Hospital Problem list   ? ? ?Assessment & Plan:  ? ?Acute Hypercarbic Respiratory Failure secondary to acute COPD exacerbation vs PNA ?Acute encephalopathy ?RR increased after last ABG to 30 ?-repeat ABG later this AM ?-UDS pending ?-continue solumedrol ?-CAP coverage with Ceftriaxone and Azithromycin ?-blood and respiratory cultures ?-scheduled duonebs ?-urine strep and legionella ?-PE on differential, though not significantly hypoxic or hypotensive and trop, BNP, lactic not significantly elevated  ?-trend trops,  ?-Maintain full vent support with SAT/SBT as tolerated ?-titrate Vent setting to maintain SpO2 greater than or equal to 90%. ?-HOB elevated 30 degrees. ?-Plateau pressures less than 30 cm H20.  ?-Follow chest x-ray, ABG prn.   ?-Bronchial hygiene and RT/bronchodilator protocol. ? ? ? ? ?CKD stage 3b ?Creatinine 1.7 at baseline ?-monitor renal function, UOP and electrolytes, renally dose medications ? ?Chronic Lymphedema ?RLE, dopplers negative last admission ? ?Tobacco Abuse ?-will need cessation counseling ? ? ? ?HTN, HL, chronic HFrEF ?Last echo 12/22 EF 45-50% and  stage 1 diastolic dysfunction ?-has duplicate chart, resume when medications verified  ?-resume Asa ?-Lasix 40mg  given in ED, monitor response ? ? ?DM ?-SSI ? ? ? ? ?Best Practice (right click and "Reselect all SmartList Selections" daily)  ? ?Diet/type: NPO ?DVT prophylaxis: prophylactic heparin  ?GI prophylaxis: PPI ?Lines: N/A ?Foley:  Yes, and it is still needed ?Code Status:  full code ?Last date of  multidisciplinary goals of care discussion [wife updated at the bedside] ? ?Labs   ?CBC: ?Recent Labs  ?Lab 01/07/22 ?0249 01/07/22 ?0309 01/07/22 ?0327  ?WBC  --  6.9  --   ?NEUTROABS  --  4.3  --   ?HGB 12.6* 12.4* 12.2*  ?HCT 37.0* 40.9 36.0*  ?MCV  --  90.5  --   ?PLT  --  210  --   ? ? ?Basic Metabolic Panel: ?Recent Labs  ?Lab 01/07/22 ?0249 01/07/22 ?0309 01/07/22 ?0327  ?NA 140 138 141  ?K 4.7 4.7 4.9  ?CL  --  106  --   ?CO2  --  21*  --   ?GLUCOSE  --  160*  --   ?BUN  --  22  --   ?CREATININE  --  1.70*  --   ?CALCIUM  --  9.0  --   ? ?GFR: ?CrCl cannot be calculated (Unknown ideal weight.). ?Recent Labs  ?Lab 01/07/22 ?0309  ?WBC 6.9  ?LATICACIDVEN 1.6  ? ? ?Liver Function Tests: ?No results for input(s): AST, ALT, ALKPHOS, BILITOT, PROT, ALBUMIN in the last 168 hours. ?No results for input(s): LIPASE, AMYLASE in the last 168 hours. ?No results for input(s): AMMONIA in the last 168 hours. ? ?ABG ?   ?Component Value Date/Time  ? PHART 7.219 (L) 01/07/2022 0327  ? PCO2ART 68.9 ([place]) 01/07/2022 0327  ? PO2ART 240 (H) 01/07/2022 0327  ? HCO3 28.2 (H) 01/07/2022 0327  ? TCO2 30 01/07/2022 0327  ? ACIDBASEDEF 1.0 01/07/2022 0327  ? O2SAT 100 01/07/2022 0327  ?  ? ?Coagulation Profile: ?No results for input(s): INR, PROTIME in the last 168 hours. ? ?Cardiac Enzymes: ?No results for input(s): CKTOTAL, CKMB, CKMBINDEX, TROPONINI in the last 168 hours. ? ?HbA1C: ?No results found for: HGBA1C ? ?CBG: ?Recent Labs  ?Lab 01/07/22 ?0211  ?GLUCAP 154*  ? ? ?Review of Systems:   ?Unable to obtain ? ?Past Medical History:  ?He,  has no past medical history on file.  ? ?Surgical History:  ? ? ?Social History:  ?   ? ?Family History:  ?His family history is not on file.  ? ?Allergies ?No Known Allergies  ? ?Home Medications  ?Prior to Admission medications   ?Not on File  ?  ? ?Critical care time: 42 minutes ?  ? ? ?CRITICAL CARE ?Performed by: Otilio Carpen Bellatrix Devonshire ? ? ?Total critical care time: 42 minutes ? ?Critical care time  was exclusive of separately billable procedures and treating other patients. ? ?Critical care was necessary to treat or prevent imminent or life-threatening deterioration. ? ?Critical care was time spent personally by me on the following activities: development of treatment plan with patient and/or surrogate as well as nursing, discussions with consultants, evaluation of patient's response to treatment, examination of patient, obtaining history from patient or surrogate, ordering and performing treatments and interventions, ordering and review of laboratory studies, ordering and review of radiographic studies, pulse oximetry and re-evaluation of patient's condition. ? ?Otilio Carpen Dutch Ing, PA-C ?[facility] ?See Amion for pager ?If no response  to pager , please call [phone] until 7pm ?After 7:00 pm call Elink  [phone] ? ? ?

## 2022-01-07 NOTE — ED Provider Notes (Signed)
?[facility] ?Provider Note ? ? ?CSN: [id number] ?Arrival date & time: 01/07/22  0156 ? ?  ? ?History ? ?Chief Complaint  ?Patient presents with  ? Shortness of Breath  ? ? ?Phillip Kennedy is a 66 y.o. male. ? ?The history is provided by the patient and medical records.  ?Shortness of Breath ? ?66 y.o. M with hx of COPD, CHF, HTN, lymphedema, presenting to the ED with respiratory distress.  EMS called to home, patient was in his car-- family reported he was attempting to drive himself to the ED for SOB.  EMS attempted CPAP several times, unable to tolerate.  Continues thrashing about with NRB mask on upon arrival.  Unable to give any additional history. ? ?Home Medications ?Prior to Admission medications   ?Not on File  ?   ? ?Allergies    ?Patient has no allergy information on record.   ? ?Review of Systems   ?Review of Systems  ?Unable to perform ROS: Acuity of condition  ?Respiratory:  Positive for shortness of breath.   ? ?Physical Exam ?Updated Vital Signs ?BP (!) 180/137   Pulse 95   Temp 97.9 ?F (36.6 ?C)   Resp 15   Ht 5\' 11"  (1.803 m)   SpO2 96%  ? ?Physical Exam ?Vitals and nursing note reviewed.  ?Constitutional:   ?   Comments: Awake, distress, thrashing around on stretcher, fighting against bipap and staff  ?HENT:  ?   Head: Normocephalic and atraumatic.  ?Eyes:  ?   Conjunctiva/sclera: Conjunctivae normal.  ?   Pupils: Pupils are equal, round, and reactive to light.  ?Cardiovascular:  ?   Rate and Rhythm: Normal rate and regular rhythm.  ?   Heart sounds: Normal heart sounds.  ?Pulmonary:  ?   Effort: Tachypnea, accessory muscle usage and respiratory distress present.  ?   Breath sounds: Wheezing present.  ?   Comments: Increased WOB, retractions, diffuse wheezes ?Abdominal:  ?   General: Bowel sounds are normal.  ?   Palpations: Abdomen is soft.  ?Musculoskeletal:     ?   General: Normal range of motion.  ?   Cervical back: Normal range of motion.  ?    Comments: Right ankle swelling (chronic due to lymphedema) ?IO left tibia  ?Skin: ?   General: Skin is warm and dry.  ?Neurological:  ?   Mental Status: He is oriented to person, place, and time.  ? ? ?ED Results / Procedures / Treatments   ?Labs ?(all labs ordered are listed, but only abnormal results are displayed) ?Labs Reviewed  ?CBC WITH DIFFERENTIAL/PLATELET - Abnormal; Notable for the following components:  ?    Result Value  ? Hemoglobin 12.4 (*)   ? All other components within normal limits  ?BASIC METABOLIC PANEL - Abnormal; Notable for the following components:  ? CO2 21 (*)   ? Glucose, Bld 160 (*)   ? Creatinine, Ser 1.70 (*)   ? GFR, Estimated 44 (*)   ? All other components within normal limits  ?I-STAT ARTERIAL BLOOD GAS, ED - Abnormal; Notable for the following components:  ? pH, Arterial 7.117 (*)   ? pCO2 arterial 89.2 (*)   ? pO2, Arterial 275 (*)   ? Bicarbonate 28.8 (*)   ? Acid-base deficit 3.0 (*)   ? HCT 37.0 (*)   ? Hemoglobin 12.6 (*)   ? All other components within normal limits  ?CBG MONITORING, ED - Abnormal; Notable for  the following components:  ? Glucose-Capillary 154 (*)   ? All other components within normal limits  ?I-STAT ARTERIAL BLOOD GAS, ED - Abnormal; Notable for the following components:  ? pH, Arterial 7.219 (*)   ? pCO2 arterial 68.9 (*)   ? pO2, Arterial 240 (*)   ? Bicarbonate 28.2 (*)   ? HCT 36.0 (*)   ? Hemoglobin 12.2 (*)   ? All other components within normal limits  ?TROPONIN I (HIGH SENSITIVITY) - Abnormal; Notable for the following components:  ? Troponin I (High Sensitivity) 48 (*)   ? All other components within normal limits  ?RESP PANEL BY RT-PCR (FLU A&B, COVID) [id number]  ?CULTURE, BLOOD (ROUTINE X 2)  ?CULTURE, BLOOD (ROUTINE X 2)  ?URINE CULTURE  ?LACTIC ACID, PLASMA  ?BRAIN NATRIURETIC PEPTIDE  ?URINALYSIS, ROUTINE W REFLEX MICROSCOPIC  ?RAPID URINE DRUG SCREEN, HOSP PERFORMED  ?HIV ANTIBODY (ROUTINE TESTING W REFLEX)  ?CBC  ?CREATININE, SERUM  ?CBC   ?BASIC METABOLIC PANEL  ?BLOOD GAS, ARTERIAL  ?MAGNESIUM  ?PHOSPHORUS  ?TROPONIN I (HIGH SENSITIVITY)  ? ? ?EKG ?EKG Interpretation ? ?Date/Time:  Sunday January 07 2022 02:20:05 EDT ?Ventricular Rate:  113 ?PR Interval:  154 ?QRS Duration: 110 ?QT Interval:  329 ?QTC Calculation: 452 ?R Axis:   65 ?Text Interpretation: Sinus tachycardia Biatrial enlargement Left ventricular hypertrophy Abnrm T, consider ischemia, lateral leads Confirmed by Randal Buba, April ([id number]) on 01/07/2022 2:28:18 AM ? ?Radiology ?CT HEAD WO CONTRAST (5MM) ? ?Result Date: 01/07/2022 ?CLINICAL DATA:  Delirium. EXAM: CT HEAD WITHOUT CONTRAST TECHNIQUE: Contiguous axial images were obtained from the base of the skull through the vertex without intravenous contrast. RADIATION DOSE REDUCTION: This exam was performed according to the departmental dose-optimization program which includes automated exposure control, adjustment of the mA and/or kV according to patient size and/or use of iterative reconstruction technique. COMPARISON:  10/09/2021. FINDINGS: Brain: No acute intracranial hemorrhage, midline shift or mass effect. No extra-axial fluid collection. Subcortical and periventricular white matter hypodensities are noted bilaterally. No hydrocephalus. Vascular: No hyperdense vessel or unexpected calcification. Skull: Normal. Negative for fracture or focal lesion. Sinuses/Orbits: There is partial opacification of the ethmoid air cells and maxillary sinuses bilaterally. Mild mucosal thickening is present in the sphenoid sinuses. The globes are within normal limits. There is an old fracture of the medial wall of the left orbit. Other: Tubes are present in the oral cavity. IMPRESSION: 1. No acute intracranial process. 2. Extensive chronic microvascular ischemic changes. 3. Moderate paranasal sinus disease. Electronically Signed   By: Brett Fairy M.D.   On: 01/07/2022 03:11  ? ?DG Chest Port 1 View ? ?Result Date: 01/07/2022 ?CLINICAL DATA:  Dyspnea EXAM:  PORTABLE CHEST 1 VIEW COMPARISON:  10/10/2021 FINDINGS: Endotracheal tube is seen 8.2 cm above the carina, at the level of the clavicular heads. Nasogastric tube extends into the upper abdomen beyond the margin of the examination. The lungs are symmetrically well expanded. Diffuse interstitial and airspace infiltrate, more severe throughout the right lung, is present, asymmetric pulmonary edema versus infection. No pneumothorax or pleural effusion. Cardiac size appears mildly enlarged. 2.4 cm metallic density of unclear significance overlies the superior vena cava, possibly representing an object overlying the patient. Thoracic aorta is ectatic and tortuous, unchanged from prior examination. No acute bone abnormality. IMPRESSION: Support tubes as described above. Diffuse asymmetric pulmonary infiltrate, edema versus infection. Stable cardiomegaly. Stable tortuosity of the thoracic aorta. Electronically Signed   By: Fidela Salisbury M.D.   On: 01/07/2022 02:33   ? ?  Procedures ?Procedure Name: Intubation ?Date/Time: 01/07/2022 2:18 AM ?Performed by: Larene Pickett, PA-C ?Pre-anesthesia Checklist: Patient identified, Emergency Drugs available, Patient being monitored, Timeout performed and Suction available ?Oxygen Delivery Method: Non-rebreather mask ?Preoxygenation: Pre-oxygenation with 100% oxygen ?Induction Type: Rapid sequence ?Ventilation: Mask ventilation with difficulty ?Laryngoscope Size: Glidescope and 4 ?Grade View: Grade I ?Tube size: 7.5 mm ?Number of attempts: 1 ?Airway Equipment and Method: Stylet and Video-laryngoscopy ?Placement Confirmation: ETT inserted through vocal cords under direct vision, Positive ETCO2, CO2 detector and Breath sounds checked- equal and bilateral ?Secured at: 23 cm ?Tube secured with: ETT holder ?Dental Injury: Teeth and Oropharynx as per pre-operative assessment  ? ? ?  ?CRITICAL CARE ?Performed by: Larene Pickett ? ? ?Total critical care time: 65 minutes ? ?Critical care time  was exclusive of separately billable procedures and treating other patients. ? ?Critical care was necessary to treat or prevent imminent or life-threatening deterioration. ? ?Critical care was time spent personally by

## 2022-01-07 NOTE — ED Triage Notes (Signed)
Pt arrive by EMS from home for c/o increase SOB on his car, very labored breathing on arrival and agitated. HR on 130 ?

## 2022-01-07 NOTE — Progress Notes (Signed)
Pt transported by RNx2 and RT from ED RESUC to 2M12 w/ no complications ?

## 2022-01-07 NOTE — Progress Notes (Signed)
eLink Physician-Brief Progress Note ?Patient Name: Phillip Kennedy ?DOB: [birth year] ?MRN: [id number] ? ? ?Date of Service ? 01/07/2022  ?HPI/Events of Note ? Patient with a known history of COPD secondary to smoking, admitted with pneumonia and acute on chronic hypercapnic / hypoxemic respiratory failure.  ?eICU Interventions ? New Patient Evaluation completed. Propofol and soft bilateral wrist restraints ordered.  ? ? ? ?  ? ?Frederik Pear ?01/07/2022, 6:21 AM ?

## 2022-01-07 NOTE — Progress Notes (Addendum)
eLink Physician-Brief Progress Note ?Patient Name: Phillip Kennedy ?DOB: August 31, 1956 ?MRN: [id number] ? ? ?Date of Service ? 01/07/2022  ?HPI/Events of Note ?   ?eICU Interventions ? Renewed wrist restraint orders   ? ? ? ?  ?[name] ?01/07/2022, 10:11 PM ? ? ? ?01/08/22 12:24AM ?Notified that urine output down to 58ml over 2 hours.  ?One time dose of lasix 40mg  IV ordered.  ?Will monitor response.  ?

## 2022-01-07 NOTE — Progress Notes (Signed)
? ?NAME:  Phillip Kennedy, MRN:  [id number], DOB:  02-28-1956, LOS: 0 ?ADMISSION DATE:  01/07/2022, CONSULTATION DATE:  01/07/22 ?REFERRING MD:  Palumbo, CHIEF COMPLAINT:  respiratory failure  ? ?History of Present Illness:  ?Phillip Kennedy is a 66 y.o. M with PMH significant for COPD stage 0 followed by Dr. Melvyn Novas, current 1ppd smoker, HFrEF, CKD stage 3b, HTN, HL, DM who presented to the ED agitated and in respiratory distress and was intubated on arrival. Pt's spouse [name] is at the bedside and states that he has been more short of breath for the last couple of days.  He tried to mow the lawn the morning of 3/18 and later that day became increasingly SOB and got in the car to go to the ED, but then called EMS. No recent fever, mentioned CP or worsening LE edema. On arrival, pt was thrashing with non-rebreather so was intubated.   ? ?Work-up in the ED significant for hypercapnia with pH 7.1 pCO2 89, CXR with R lung infiltrate, CTH negative, creatinine 1.7.  He was given Lasix, Solumedrol, Ceftriaxone and Azithromycin and PCCM consulted for admission ? ? ?Pertinent  Medical History  ?COPD stage 0 followed by Dr. Melvyn Novas, current 1ppd smoker, HFrEF, CKD stage 3b, HTN, HL, DM ? ?Significant Hospital Events: ?Including procedures, antibiotic start and stop dates in addition to other pertinent events   ?3/19 presented with agitation and hypercarbic respiratory failure, intubated  ? ?Interim History / Subjective:  ?NAEON, vent weaned, Cr at baseline ? ?Objective   ?Blood pressure 102/74, pulse 66, temperature 97.7 ?F (36.5 ?C), resp. rate (!) 30, height 5\' 11"  (1.803 m), weight 98.2 kg, SpO2 100 %. ?   ?Vent Mode: PRVC ?FiO2 (%):  [40 %-100 %] 40 % ?Set Rate:  [25 bmp-30 bmp] 30 bmp ?Vt Set:  [500 mL-600 mL] 520 mL ?PEEP:  [5 cmH20] 5 cmH20 ?Plateau Pressure:  [14 cmH20-22 cmH20] 14 cmH20  ? ?Intake/Output Summary (Last 24 hours) at 01/07/2022 0923 ?Last data filed at 01/07/2022 0900 ?Gross per 24 hour  ?Intake 449.1 ml   ?Output 1500 ml  ?Net -1050.9 ml  ? ? ?Filed Weights  ? 01/07/22 0500  ?Weight: 98.2 kg  ? ? ?General:  critically ill-appearing M, intubated and sedated, agitated ?HEENT: MM pink/moist, sclera injected, pupils equal, ETT in place ?Neuro: examined on Propofol and fentanyl, RASS -3 ?CV: s1s2 rrr, no m/r/g ?PULM:  rhonchi and scattered expiratory wheezing bilaterally  ?GI: soft, bsx4 active  ?Extremities: warm/dry, RLE 2+ edema  ?Skin: no rashes or lesions ? ? ?Resolved Hospital Problem list   ? ? ?Assessment & Plan:  ? ?Acute Hypercarbic Respiratory Failure secondary to acute Reactive airways exacerbation (no fixed obs 2022 PFT) vs PNA vs Pulmonary edema ?Acute encephalopathy ?-continue solumedrol ?-CAP coverage with Ceftriaxone and Azithromycin ?-blood and respiratory cultures ?-scheduled duonebs ?-urine strep and legionella pending ?-Maintain full vent support with SAT/SBT as tolerated ?-titrate Vent setting to maintain SpO2 greater than or equal to 90%. ?-PRN duonebs, start scheduled brovana/yupelri ? ?CKD stage 3b ?Creatinine at baseline ?-monitor renal function, UOP and electrolytes, renally dose medications ? ?Chronic Lymphedema ?-RLE, dopplers negative last admission ? ?HTN, HL, chronic HFrEF ?Last echo 12/22 EF 45-50% and stage 1 diastolic dysfunction ?-Resume metoprolol (12.5 BID lower than home dose limited by HR), hold clonidine, IMDUR, hydralazine for now resume as needed ?-Continue Asa, home torsemide 20 mg daily ? ?DM ?-SSI ? ? ?Best Practice (right click and "Reselect all SmartList Selections" daily)  ? ?  Diet/type: tubefeeds ?DVT prophylaxis: prophylactic heparin  ?GI prophylaxis: PPI ?Lines: N/A ?Foley:  Yes, and it is still needed ?Code Status:  full code ?Last date of multidisciplinary goals of care discussion [wife updated at the bedside] ? ?Labs   ?CBC: ?Recent Labs  ?Lab 01/07/22 ?[id number] 01/07/22 ?0309 01/07/22 ?0327 01/07/22 ?[id number] 01/07/22 ?[id number]  ?WBC  --  6.9  --   --   --   ?NEUTROABS  --  4.3   --   --   --   ?HGB 12.6* 12.4* 12.2* 11.6* 11.2*  ?HCT 37.0* 40.9 36.0* 34.0* 33.0*  ?MCV  --  90.5  --   --   --   ?PLT  --  210  --   --   --   ? ? ? ?Basic Metabolic Panel: ?Recent Labs  ?Lab 01/07/22 ?0309 01/07/22 ?0327 01/07/22 ?0421 01/07/22 ?[id number] 01/07/22 ?[id number]  ?NA 138 141 136 141 140  ?K 4.7 4.9 5.2* 5.0 4.5  ?CL 106  --  106  --   --   ?CO2 21*  --  22  --   --   ?GLUCOSE 160*  --  131*  --   --   ?BUN 22  --  24*  --   --   ?CREATININE 1.70*  --  1.66*  --   --   ?CALCIUM 9.0  --  8.3*  --   --   ?MG  --   --  2.4  --   --   ?PHOS  --   --  5.7*  --   --   ? ? ?GFR: ?Estimated Creatinine Clearance: 53 mL/min (A) (by C-G formula based on SCr of 1.66 mg/dL (H)). ?Recent Labs  ?Lab 01/07/22 ?0309  ?WBC 6.9  ?LATICACIDVEN 1.6  ? ? ? ?Liver Function Tests: ?No results for input(s): AST, ALT, ALKPHOS, BILITOT, PROT, ALBUMIN in the last 168 hours. ?No results for input(s): LIPASE, AMYLASE in the last 168 hours. ?No results for input(s): AMMONIA in the last 168 hours. ? ?ABG ?   ?Component Value Date/Time  ? PHART 7.333 (L) 01/07/2022 0854  ? PCO2ART 49.9 (H) 01/07/2022 0854  ? PO2ART 106 01/07/2022 0854  ? HCO3 26.5 01/07/2022 0854  ? TCO2 28 01/07/2022 0854  ? ACIDBASEDEF 2.0 01/07/2022 0440  ? O2SAT 98 01/07/2022 0854  ? ?  ? ?Coagulation Profile: ?No results for input(s): INR, PROTIME in the last 168 hours. ? ?Cardiac Enzymes: ?No results for input(s): CKTOTAL, CKMB, CKMBINDEX, TROPONINI in the last 168 hours. ? ?HbA1C: ?No results found for: HGBA1C ? ?CBG: ?Recent Labs  ?Lab 01/07/22 ?0211 01/07/22 ?[id number]  ?GLUCAP 154* 187*  ? ? ? ?Review of Systems:   ?Unable to obtain ? ?Past Medical History:  ?He,  has no past medical history on file.  ? ?Surgical History:  ? ? ?Social History:  ?   ? ?Family History:  ?His family history is not on file.  ? ?Allergies ?No Known Allergies  ? ?Home Medications  ?Prior to Admission medications   ?Not on File  ?  ? ?Critical care time:  ?  ? ? ?CRITICAL CARE ?Performed by:  Bonna Gains Rodrigo Mcgranahan ? ? ?Total critical care time: 45 minutes ? ?Critical care time was exclusive of separately billable procedures and treating other patients. ? ?Critical care was necessary to treat or prevent imminent or life-threatening deterioration. ? ?Critical care was time spent personally by me on the following activities: development of treatment plan with  patient and/or surrogate as well as nursing, discussions with consultants, evaluation of patient's response to treatment, examination of patient, obtaining history from patient or surrogate, ordering and performing treatments and interventions, ordering and review of laboratory studies, ordering and review of radiographic studies, pulse oximetry and re-evaluation of patient's condition. ? ?Lanier Clam, MD ?See Shea Evans for contact info ?If no response please call [phone] until 7pm ?After 7:00 pm call Elink  [phone] ? ? ?

## 2022-01-08 DIAGNOSIS — J9602 Acute respiratory failure with hypercapnia: Secondary | ICD-10-CM | POA: Diagnosis not present

## 2022-01-08 LAB — GLUCOSE, CAPILLARY
Glucose-Capillary: 108 mg/dL — ABNORMAL HIGH (ref 70–99)
Glucose-Capillary: 113 mg/dL — ABNORMAL HIGH (ref 70–99)
Glucose-Capillary: 130 mg/dL — ABNORMAL HIGH (ref 70–99)
Glucose-Capillary: 149 mg/dL — ABNORMAL HIGH (ref 70–99)
Glucose-Capillary: 89 mg/dL (ref 70–99)
Glucose-Capillary: 92 mg/dL (ref 70–99)
Glucose-Capillary: 99 mg/dL (ref 70–99)

## 2022-01-08 LAB — BASIC METABOLIC PANEL
Anion gap: 11 (ref 5–15)
BUN: 49 mg/dL — ABNORMAL HIGH (ref 8–23)
CO2: 23 mmol/L (ref 22–32)
Calcium: 8.9 mg/dL (ref 8.9–10.3)
Chloride: 106 mmol/L (ref 98–111)
Creatinine, Ser: 3.3 mg/dL — ABNORMAL HIGH (ref 0.61–1.24)
GFR, Estimated: 20 mL/min — ABNORMAL LOW (ref >60)
Glucose, Bld: 115 mg/dL — ABNORMAL HIGH (ref 70–99)
Potassium: 5.1 mmol/L (ref 3.5–5.1)
Sodium: 140 mmol/L (ref 135–145)

## 2022-01-08 LAB — CBC
HCT: 35 % — ABNORMAL LOW (ref 39.0–52.0)
Hemoglobin: 11 g/dL — ABNORMAL LOW (ref 13.0–17.0)
MCH: 27.6 pg (ref 26.0–34.0)
MCHC: 31.4 g/dL (ref 30.0–36.0)
MCV: 87.9 fL (ref 80.0–100.0)
Platelets: 168 10*3/uL (ref 150–400)
RBC: 3.98 MIL/uL — ABNORMAL LOW (ref 4.22–5.81)
RDW: 13.9 % (ref 11.5–15.5)
WBC: 6.1 10*3/uL (ref 4.0–10.5)
nRBC: 0 % (ref 0.0–0.2)

## 2022-01-08 LAB — POCT I-STAT 7, (LYTES, BLD GAS, ICA,H+H)
Acid-Base Excess: 0 mmol/L (ref 0.0–2.0)
Bicarbonate: 26.1 mmol/L (ref 20.0–28.0)
Calcium, Ion: 1.22 mmol/L (ref 1.15–1.40)
HCT: 30 % — ABNORMAL LOW (ref 39.0–52.0)
Hemoglobin: 10.2 g/dL — ABNORMAL LOW (ref 13.0–17.0)
O2 Saturation: 98 %
Patient temperature: 37.3
Potassium: 5.2 mmol/L — ABNORMAL HIGH (ref 3.5–5.1)
Sodium: 139 mmol/L (ref 135–145)
TCO2: 28 mmol/L (ref 22–32)
pCO2 arterial: 50.5 mmHg — ABNORMAL HIGH (ref 32–48)
pH, Arterial: 7.322 — ABNORMAL LOW (ref 7.35–7.45)
pO2, Arterial: 118 mmHg — ABNORMAL HIGH (ref 83–108)

## 2022-01-08 LAB — URINE CULTURE: Culture: NO GROWTH

## 2022-01-08 LAB — TRIGLYCERIDES: Triglycerides: 72 mg/dL (ref <150)

## 2022-01-08 LAB — MAGNESIUM: Magnesium: 2.5 mg/dL — ABNORMAL HIGH (ref 1.7–2.4)

## 2022-01-08 LAB — PHOSPHORUS: Phosphorus: 5.3 mg/dL — ABNORMAL HIGH (ref 2.5–4.6)

## 2022-01-08 MED ORDER — CLONIDINE HCL 0.2 MG PO TABS
0.1000 mg | ORAL_TABLET | Freq: Two times a day (BID) | ORAL | Status: DC
  Administered 2022-01-08: 0.1 mg via ORAL
  Filled 2022-01-08: qty 1

## 2022-01-08 MED ORDER — FUROSEMIDE 10 MG/ML IJ SOLN
40.0000 mg | INTRAMUSCULAR | Status: AC
  Administered 2022-01-08: 40 mg via INTRAVENOUS
  Filled 2022-01-08: qty 4

## 2022-01-08 MED ORDER — HYDRALAZINE HCL 50 MG PO TABS
25.0000 mg | ORAL_TABLET | Freq: Three times a day (TID) | ORAL | Status: DC
  Administered 2022-01-08 (×2): 25 mg via ORAL
  Filled 2022-01-08 (×2): qty 1

## 2022-01-08 MED ORDER — POLYETHYLENE GLYCOL 3350 17 G PO PACK: 17.0000 g | PACK | Freq: Every day | ORAL | Status: DC | PRN

## 2022-01-08 MED ORDER — CLONIDINE HCL 0.2 MG PO TABS
0.1000 mg | ORAL_TABLET | Freq: Once | ORAL | Status: AC
  Administered 2022-01-08: 0.1 mg via ORAL
  Filled 2022-01-08: qty 1

## 2022-01-08 MED ORDER — ASPIRIN 81 MG PO CHEW
81.0000 mg | CHEWABLE_TABLET | Freq: Every day | ORAL | Status: DC
  Administered 2022-01-09 – 2022-01-10 (×2): 81 mg via ORAL
  Filled 2022-01-08 (×2): qty 1

## 2022-01-08 MED ORDER — LACTATED RINGERS IV SOLN: INTRAVENOUS | Status: DC

## 2022-01-08 NOTE — Procedures (Signed)
Extubation Procedure Note ? ?Patient Details:   ?Name: Phillip Kennedy ?DOB: [birth year] ?MRN: [id number] ?  ?Airway Documentation:  ?  ?Vent end date: 01/08/22 Vent end time: 0935  ? ?Evaluation ? O2 sats: stable throughout ?Complications: No apparent complications ?Patient did tolerate procedure well. ?Bilateral Breath Sounds: Clear, Diminished ?  ?Yes, pt could speak post extubation.  Pt extubated to 4 l/m [place] without complications. ? ?Audrie Lia ?01/08/2022, 9:35 AM ? ?

## 2022-01-08 NOTE — Discharge Instructions (Signed)
Heart Healthy, Consistent Carbohydrate Nutrition Therapy  ? ?A heart-healthy and consistent carbohydrate diet is recommended to manage heart disease and diabetes. ?To follow a heart-healthy and consistent carbohydrate diet, ?Eat a balanced diet with whole grains, fruits and vegetables, and lean protein sources.  ?Choose heart-healthy unsaturated fats. Limit saturated fats, trans fats, and cholesterol intake. Eat more plant-based or vegetarian meals using beans and soy foods for protein.  ?Eat whole, unprocessed foods to limit the amount of sodium (salt) you eat.  ?Choose a consistent amount of carbohydrate at each meal and snack. Limit refined carbohydrates especially sugar, sweets and sugar-sweetened beverages.  ?If you drink alcohol, do so in moderation: one serving per day (women) and two servings per day (men). ?o One serving is equivalent to 12 ounces beer, 5 ounces wine, or 1.5 ounces distilled spirits ? ?Tips ?Tips for Choosing Heart-Healthy Fats ?Choose lean protein and low-fat dairy foods to reduce saturated fat intake. ?Saturated fat is usually found in animal-based protein and is associated with certain health risks. Saturated fat is the biggest contributor to raise low-density lipoprotein (LDL) cholesterol levels. Research shows that limiting saturated fat lowers unhealthy cholesterol levels. Eat no more than 7% of your total calories each day from saturated fat. Ask your RDN to help you determine how much saturated fat is right for you. ?There are many foods that do not contain large amounts of saturated fats. Swapping these foods to replace foods high in saturated fats will help you limit the saturated fat you eat and improve your cholesterol levels. You can also try eating more plant-based or vegetarian meals. ?Instead of? Try:  ?Whole milk, cheese, yogurt, and ice cream 1% or skim milk, low-fat cheese, non-fat yogurt, and low-fat ice cream  ?Fatty, marbled beef and pork Lean beef, pork, or venison   ?Poultry with skin Poultry without skin  ?Butter, stick margarine Reduced-fat, whipped, or liquid spreads  ?Coconut oil, palm oil Liquid vegetable oils: corn, canola, olive, soybean and safflower oils  ? ?Avoid foods that contain trans fats. ?Trans fats increase levels of LDL-cholesterol. Hydrogenated fat in processed foods is the main source of trans fats in foods.  ?Trans fats can be found in stick margarine, shortening, processed sweets, baked goods, some fried foods, and packaged foods made with hydrogenated oils. Avoid foods with ?partially hydrogenated oil? on the ingredient list such as: cookies, pastries, baked goods, biscuits, crackers, microwave popcorn, and frozen dinners. ?Choose foods with heart healthy fats. ?Polyunsaturated and monounsaturated fat are unsaturated fats that may help lower your blood cholesterol level when used in place of saturated fat in your diet. ?Ask your RDN about taking a dietary supplement with plant sterols and stanols to help lower your cholesterol level. ?Research shows that substituting saturated fats with unsaturated fats is beneficial to cholesterol levels. Try these easy swaps: ?Instead of? Try:  ?Butter, stick margarine, or solid shortening Reduced-fat, whipped, or liquid spreads  ?Beef, pork, or poultry with skin Fish and seafood  ?Chips, crackers, snack foods Raw or unsalted nuts and seeds or nut butters ?Hummus with vegetables ?Avocado on toast  ?Coconut oil, palm oil Liquid vegetable oils: corn, canola, olive, soybean and safflower oils  ?Limit the amount of cholesterol you eat to less than 200 milligrams per day. ?Cholesterol is a substance carried through the bloodstream via lipoproteins, which are known as ?transporters? of fat. Some body functions need cholesterol to work properly, but too much cholesterol in the bloodstream can damage arteries and build up blood vessel linings (  which can lead to heart attack and stroke). You should eat less than 200 milligrams  cholesterol per day. ?People respond differently to eating cholesterol. There is no test available right now that can figure out which people will respond more to dietary cholesterol and which will respond less. For individuals with high intake of dietary cholesterol, different types of increase (none, small, moderate, large) in LDL-cholesterol levels are all possible.  ?Food sources of cholesterol include egg yolks and organ meats such as liver, gizzards. Limit egg yolks to two to four per week and avoid organ meats like liver and gizzards to control cholesterol intake. ?Tips for Choosing Heart-Healthy Carbohydrates ?Consume a consistent amount of carbohydrate ?It is important to eat foods with carbohydrates in moderation because they impact your blood glucose level. Carbohydrates can be found in many foods such as: ?Grains (breads, crackers, rice, pasta, and cereals)  ?Starchy Vegetables (potatoes, corn, and peas)  ?Beans and legumes  ?Milk, soy milk, and yogurt  ?Fruit and fruit juice  ?Sweets (cakes, cookies, ice cream, jam and jelly) ?Your RDN will help you set a goal for how many carbohydrate servings to eat at your meals and snacks. For many adults, eating 3 to 5 servings of carbohydrate foods at each meal and 1 or 2 carbohydrate servings for each snack works well.  ?Check your blood glucose level regularly. It can tell you if you need to adjust when you eat carbohydrates. ?Choose foods rich in viscous (soluble) fiber ?Viscous, or soluble, is found in the walls of plant cells. Viscous fiber is found only in plant-based foods. Eating foods with fiber helps to lower your unhealthy cholesterol and keep your blood glucose in range  ?Rich sources of viscous fiber include vegetables (asparagus, Brussels sprouts, sweet potatoes, turnips) fruit (apricots, mangoes, oranges), legumes, and whole grains (barley, oats, and oat bran).  ?As you increase your fiber intake gradually, also increase the amount of water you  drink. This will help prevent constipation.  ?If you have difficulty achieving this goal, ask your RDN about fiber laxatives. Choose fiber supplements made with viscous fibers such as psyllium seed husks or methylcellulose to help lower unhealthy cholesterol.  ?Limit refined carbohydrates  ?There are three types of carbohydrates: starches, sugar, and fiber. Some carbohydrates occur naturally in food, like the starches in rice or corn or the sugars in fruits and milk. Refined carbohydrates--foods with high amounts of simple sugars--can raise triglyceride levels. High triglyceride levels are associated with coronary heart disease. ?Some examples of refined carbohydrate foods are table sugar, sweets, and beverages sweetened with added sugar. ?Tips for Reducing Sodium (Salt) ?Although sodium is important for your body to function, too much sodium can be harmful for people with high blood pressure. As sodium and fluid buildup in your tissues and bloodstream, your blood pressure increases. High blood pressure may cause damage to other organs and increase your risk for a stroke. ?Even if you take a pill for blood pressure or a water pill (diuretic) to remove fluid, it is still important to have less salt in your diet. Ask your doctor and RDN what amount of sodium is right for you. ?Avoid processed foods. Eat more fresh foods.  ?Fresh fruits and vegetables are naturally low in sodium, as well as frozen vegetables and fruits that have no added juices or sauces.  ?Fresh meats are lower in sodium than processed meats, such as bacon, sausage, and hotdogs. Read the nutrition label or ask your butcher to help you find a   fresh meat that is low in sodium. ?Eat less salt--at the table and when cooking.  ?A single teaspoon of table salt has 2,300 mg of sodium.  ?Leave the salt out of recipes for pasta, casseroles, and soups.  ?Ask your RDN how to cook your favorite recipes without sodium ?Be a smart shopper.  ?Look for food packages  that say ?salt-free? or ?sodium-free.? These items contain less than 5 milligrams of sodium per serving.  ??Very low-sodium? products contain less than 35 milligrams of sodium per serving.  ??Low-sodium? pr

## 2022-01-08 NOTE — Evaluation (Signed)
Physical Therapy Evaluation ?Patient Details ?Name: Phillip Kennedy ?MRN: [id number] ?DOB: 1956-01-05 ?Today's Date: 01/08/2022 ? ?History of Present Illness ? 66 yo admitted 3/19 with SOB with intubation 3/19-3/20. PMhx:HFrEF, CKD stage 3b, HTN, HL, DM, COPd, smoker  ?Clinical Impression ? PT very pleasant and moving well with sats initially 99% on 5L with pt able to maintain 93% on 3L with activity and maintained on 3L at rest end of session. Pt with slightly decreased balance and activity tolerance who will benefit from acute therapy to maximize mobility, safety and independence to return to PLOF.     ?   ? ?Recommendations for follow up therapy are one component of a multi-disciplinary discharge planning process, led by the attending physician.  Recommendations may be updated based on patient status, additional functional criteria and insurance authorization. ? ?Follow Up Recommendations No PT follow up ? ?  ?Assistance Recommended at Discharge Intermittent Supervision/Assistance  ?Patient can return home with the following ? A little help with bathing/dressing/bathroom;Assistance with cooking/housework;Help with stairs or ramp for entrance ? ?  ?Equipment Recommendations None recommended by PT  ?Recommendations for Other Services ?    ?  ?Functional Status Assessment Patient has had a recent decline in their functional status and demonstrates the ability to make significant improvements in function in a reasonable and predictable amount of time.  ? ?  ?Precautions / Restrictions Precautions ?Precautions: Fall;Other (comment) ?Precaution Comments: watch sats  ? ?  ? ?Mobility ? Bed Mobility ?Overal bed mobility: Modified Independent ?  ?  ?  ?  ?  ?  ?General bed mobility comments: use of rail with HOB 20 degrees ?  ? ?Transfers ?Overall transfer level: Needs assistance ?  ?Transfers: Sit to/from Stand ?Sit to Stand: Min guard ?  ?  ?  ?  ?  ?General transfer comment: guarding for safety with initial imbalance  needing single UE support to steady ?  ? ?Ambulation/Gait ?Ambulation/Gait assistance: Min guard ?Gait Distance (Feet): 160 Feet ?Assistive device: None ?Gait Pattern/deviations: Step-through pattern, Decreased stride length, Wide base of support ?  ?Gait velocity interpretation: 1.31 - 2.62 ft/sec, indicative of limited community ambulator ?  ?General Gait Details: wide BOS with slow steady gait and pt able to walk 160' without AD on 3L with sats 93-97% ? ?Stairs ?  ?  ?  ?  ?  ? ?Wheelchair Mobility ?  ? ?Modified Rankin (Stroke Patients Only) ?  ? ?  ? ?Balance Overall balance assessment: Needs assistance ?  ?Sitting balance-Leahy Scale: Fair ?Sitting balance - Comments: sitting EOB without UE support ?  ?Standing balance support: No upper extremity supported ?Standing balance-Leahy Scale: Good ?Standing balance comment: pt able to stand and perform gait without UE support ?  ?  ?  ?  ?  ?  ?  ?  ?  ?  ?  ?   ? ? ? ?Pertinent Vitals/Pain Pain Assessment ?Pain Assessment: No/denies pain  ? ? ?Home Living Family/patient expects to be discharged to:: Private residence ?Living Arrangements: Spouse/significant other ?Available Help at Discharge: Family ?Type of Home: House ?Home Access: Stairs to enter ?  ?Entrance Stairs-Number of Steps: 1 ?  ?Home Layout: One level ?Home Equipment: Conservation officer, nature (2 wheels);Cane - single point;BSC/3in1;Shower seat ?   ?  ?Prior Function Prior Level of Function : Independent/Modified Independent ?  ?  ?  ?  ?  ?  ?Mobility Comments: driving and caring for self at home ?  ?  ? ? ?  Hand Dominance  ?   ? ?  ?Extremity/Trunk Assessment  ? Upper Extremity Assessment ?Upper Extremity Assessment: Overall WFL for tasks assessed ?  ? ?Lower Extremity Assessment ?Lower Extremity Assessment: Generalized weakness ?  ? ?Cervical / Trunk Assessment ?Cervical / Trunk Assessment: Normal  ?Communication  ? Communication: No difficulties  ?Cognition Arousal/Alertness: Awake/alert ?Behavior During  Therapy: [facility] for tasks assessed/performed ?Overall Cognitive Status: Within Functional Limits for tasks assessed ?  ?  ?  ?  ?  ?  ?  ?  ?  ?  ?  ?  ?  ?  ?  ?  ?  ?  ?  ? ?  ?General Comments   ? ?  ?Exercises General Exercises - Lower Extremity ?Long Arc Quad: AROM, Both, 10 reps, Seated ?Hip Flexion/Marching: AROM, Both, 10 reps, Seated  ? ?Assessment/Plan  ?  ?PT Assessment Patient needs continued PT services  ?PT Problem List Decreased mobility;Decreased activity tolerance;Decreased balance;Cardiopulmonary status limiting activity;Decreased knowledge of use of DME ? ?   ?  ?PT Treatment Interventions Gait training;Stair training;Functional mobility training;Therapeutic activities;Patient/family education;Balance training;DME instruction;Therapeutic exercise   ? ?PT Goals (Current goals can be found in the Care Plan section)  ?Acute Rehab PT Goals ?Patient Stated Goal: return home and do lawn work ?PT Goal Formulation: With patient ?Time For Goal Achievement: 01/22/22 ?Potential to Achieve Goals: Good ? ?  ?Frequency Min 3X/week ?  ? ? ?Co-evaluation   ?  ?  ?  ?  ? ? ?  ?AM-PAC PT "6 Clicks" Mobility  ?Outcome Measure Help needed turning from your back to your side while in a flat bed without using bedrails?: None ?Help needed moving from lying on your back to sitting on the side of a flat bed without using bedrails?: A Little ?Help needed moving to and from a bed to a chair (including a wheelchair)?: A Little ?Help needed standing up from a chair using your arms (e.g., wheelchair or bedside chair)?: A Little ?Help needed to walk in hospital room?: A Little ?Help needed climbing 3-5 steps with a railing? : A Lot ?6 Click Score: 18 ? ?  ?End of Session Equipment Utilized During Treatment: Gait belt;Oxygen ?Activity Tolerance: Patient tolerated treatment well ?Patient left: in chair;with call bell/phone within reach;with chair alarm set ?Nurse Communication: Mobility status ?PT Visit Diagnosis: Other  abnormalities of gait and mobility (R26.89);Difficulty in walking, not elsewhere classified (R26.2) ?  ? ?Time: [id number] ?PT Time Calculation (min) (ACUTE ONLY): 24 min ? ? ?Charges:   PT Evaluation ?$PT Eval Moderate Complexity: 1 Mod ?PT Treatments ?$Gait Training: 8-22 mins ?  ?   ? ? ?Merdis Snodgrass P, PT ?Acute Rehabilitation Services ?Pager: [phone] ?Office: [phone] ? ? ?Kaelon Weekes B Keoshia Steinmetz ?01/08/2022, 1:10 PM ? ?

## 2022-01-08 NOTE — Progress Notes (Signed)
? ?NAME:  Phillip Kennedy, MRN:  [id number], DOB:  1956/09/07, LOS: 1 ?ADMISSION DATE:  01/07/2022, CONSULTATION DATE:  01/08/22 ?REFERRING MD:  Palumbo, CHIEF COMPLAINT:  respiratory failure  ? ?History of Present Illness:  ?Phillip Kennedy is a 66 y.o. M with PMH significant for COPD stage 0 followed by Dr. Melvyn Novas, current 1ppd smoker, HFrEF, CKD stage 3b, HTN, HL, DM who presented to the ED agitated and in respiratory distress and was intubated on arrival. Pt's spouse [name] is at the bedside and states that he has been more short of breath for the last couple of days.  He tried to mow the lawn the morning of 3/18 and later that day became increasingly SOB and got in the car to go to the ED, but then called EMS. No recent fever, mentioned CP or worsening LE edema. On arrival, pt was thrashing with non-rebreather so was intubated.   ? ?Work-up in the ED significant for hypercapnia with pH 7.1 pCO2 89, CXR with R lung infiltrate, CTH negative, creatinine 1.7.  He was given Lasix, Solumedrol, Ceftriaxone and Azithromycin and PCCM consulted for admission ? ? ?Pertinent  Medical History  ?COPD stage 0 followed by Dr. Melvyn Novas, current 1ppd smoker, HFrEF, CKD stage 3b, HTN, HL, DM ? ?Significant Hospital Events: ?Including procedures, antibiotic start and stop dates in addition to other pertinent events   ?3/19 presented with agitation and hypercarbic respiratory failure, intubated  ? ?Interim History / Subjective:  ?Restraints renewed overnight; Patient noted to have decrease urine output. Otherwise, this morning appeared calm. ? ?Objective   ?Blood pressure (!) 129/91, pulse (!) 53, temperature 98.1 ?F (36.7 ?C), resp. rate (!) 24, height 5\' 11"  (1.803 m), weight 91.5 kg, SpO2 96 %. ?   ?Vent Mode: PRVC ?FiO2 (%):  [36 %-40 %] 36 % ?Set Rate:  [16 bmp-30 bmp] 16 bmp ?Vt Set:  [520 mL] 520 mL ?PEEP:  [5 cmH20] 5 cmH20 ?Plateau Pressure:  [15 cmH20-17 cmH20] 15 cmH20  ? ?Intake/Output Summary (Last 24 hours) at 01/08/2022  0945 ?Last data filed at 01/08/2022 0850 ?Gross per 24 hour  ?Intake 1572.09 ml  ?Output 920 ml  ?Net 652.09 ml  ? ?Filed Weights  ? 01/07/22 0500 01/08/22 0500  ?Weight: 98.2 kg 91.5 kg  ? ?General: normal-appearing M, extubated and awake, appears comfortable ?HEENT: MM pink/moist, sclera injected, pupils equal, ETT in place ?Neuro: Alert&oriented x3; answered questions appropriately ?CV: s1s2 rrr, no m/r/g ?PULM:  rhonchi and scattered expiratory wheezing bilaterally  ?GI: soft, bsx4 active  ?Extremities: warm/dry,  ?Skin: no rashes or lesions ? ? ?Resolved Hospital Problem list   ? ? ?Assessment & Plan:  ? ?Acute Hypercarbic Respiratory Failure secondary to acute Reactive airways exacerbation (no fixed obs 2022 PFT) vs PNA vs Pulmonary edema ?Acute encephalopathy ?Patient appeared calm this morning. ?--Spontaneous breathing trail today ?-continue solumedrol ?-CAP coverage with Ceftriaxone and Azithromycin ?-blood clx no growth to date and respiratory cultures negative for MRSA ?-scheduled duonebs ?-urine strep negative and legionella pending ?-PRN duonebs, start scheduled brovana/yupelri ? ?CKD stage 3b ?Pt noted to be hyperkalemic at 5.2; Creatinine rise 3.3 overnight with decreased urine output noted. Patient received dose of lasix overnight in addition to daily torsemide, with inappropriate output. Bladder scan yield no significant retention. ?-Hold torsemide ?-Gentle hydration; 38mL/hr for 12hrs; monitor for improvement ?-monitor renal function, UOP and electrolytes, renally dose medications ? ?Chronic Lymphedema ?-RLE, dopplers negative last admission ? ?HTN, HL, chronic HFrEF ?Last echo 12/22 EF 45-50% and  stage 1 diastolic dysfunction ?-metoprolol held in the setting of intermittent bradycardia ?-hold clonidine, IMDUR, hydralazine for now resume as needed ?-Continue Asa, hold torsemide 20 mg daily ? ?DM ?-SSI ? ? ?Best Practice (right click and "Reselect all SmartList Selections" daily)  ? ?Diet/type:  tubefeeds ?DVT prophylaxis: prophylactic heparin  ?GI prophylaxis: PPI ?Lines: N/A ?Foley:  Yes, and it is still needed ?Code Status:  full code ?Last date of multidisciplinary goals of care discussion [wife updated at the bedside] ? ?Labs   ?CBC: ?Recent Labs  ?Lab 01/07/22 ?0309 01/07/22 ?0327 01/07/22 ?[id number] 01/07/22 ?[id number] 01/08/22 ?0425 01/08/22 ?[id number]  ?WBC 6.9  --   --   --   --  6.1  ?NEUTROABS 4.3  --   --   --   --   --   ?HGB 12.4* 12.2* 11.6* 11.2* 10.2* 11.0*  ?HCT 40.9 36.0* 34.0* 33.0* 30.0* 35.0*  ?MCV 90.5  --   --   --   --  87.9  ?PLT 210  --   --   --   --  168  ? ? ?Basic Metabolic Panel: ?Recent Labs  ?Lab 01/07/22 ?0309 01/07/22 ?0327 01/07/22 ?0421 01/07/22 ?[id number] 01/07/22 ?[id number] 01/07/22 ?1641 01/08/22 ?0425 01/08/22 ?[id number]  ?NA 138   < > 136 141 140  --  139 140  ?K 4.7   < > 5.2* 5.0 4.5  --  5.2* 5.1  ?CL 106  --  106  --   --   --   --  106  ?CO2 21*  --  22  --   --   --   --  23  ?GLUCOSE 160*  --  131*  --   --   --   --  115*  ?BUN 22  --  24*  --   --   --   --  49*  ?CREATININE 1.70*  --  1.66*  --   --   --   --  3.30*  ?CALCIUM 9.0  --  8.3*  --   --   --   --  8.9  ?MG  --   --  2.4  --   --  2.4  --  2.5*  ?PHOS  --   --  5.7*  --   --  5.0*  --  5.3*  ? < > = values in this interval not displayed.  ? ?GFR: ?Estimated Creatinine Clearance: 25.8 mL/min (A) (by C-G formula based on SCr of 3.3 mg/dL (H)). ?Recent Labs  ?Lab 01/07/22 ?0309 01/08/22 ?0729  ?WBC 6.9 6.1  ?LATICACIDVEN 1.6  --   ? ? ?Liver Function Tests: ?No results for input(s): AST, ALT, ALKPHOS, BILITOT, PROT, ALBUMIN in the last 168 hours. ?No results for input(s): LIPASE, AMYLASE in the last 168 hours. ?No results for input(s): AMMONIA in the last 168 hours. ? ?ABG ?   ?Component Value Date/Time  ? PHART 7.322 (L) 01/08/2022 0425  ? PCO2ART 50.5 (H) 01/08/2022 0425  ? PO2ART 118 (H) 01/08/2022 0425  ? HCO3 26.1 01/08/2022 0425  ? TCO2 28 01/08/2022 0425  ? ACIDBASEDEF 2.0 01/07/2022 0440  ? O2SAT 98 01/08/2022 0425   ? ?  ? ?Coagulation Profile: ?No results for input(s): INR, PROTIME in the last 168 hours. ? ?Cardiac Enzymes: ?No results for input(s): CKTOTAL, CKMB, CKMBINDEX, TROPONINI in the last 168 hours. ? ?HbA1C: ?Hgb A1c MFr Bld  ?Date/Time Value Ref Range Status  ?01/07/2022 02:25 PM 7.0 (H) 4.8 -  5.6 % Final  ?  Comment:  ?  (NOTE) ?Pre diabetes:          5.7%-6.4% ? ?Diabetes:              >6.4% ? ?Glycemic control for   <7.0% ?adults with diabetes ?  ? ? ?CBG: ?Recent Labs  ?Lab 01/07/22 ?1525 01/07/22 ?1927 01/07/22 ?2333 01/08/22 ?[id number] 01/08/22 ?[id number]  ?GLUCAP 113* 147* 158* 130* 99  ? ? ?Review of Systems:   ?Unable to obtain ? ?Past Medical History:  ?He,  has no past medical history on file.  ? ?Surgical History:  ? ? ?Social History:  ?   ? ?Family History:  ?His family history is not on file.  ? ?Allergies ?No Known Allergies  ? ?Home Medications  ?Prior to Admission medications   ?Not on File  ?  ? ?Critical care time:  ?  ? ? ?CRITICAL CARE ?Performed by: Timothy Lasso ? ? ?Total critical care time: 45 minutes ? ?Critical care time was exclusive of separately billable procedures and treating other patients. ? ?Critical care was necessary to treat or prevent imminent or life-threatening deterioration. ? ?Critical care was time spent personally by me on the following activities: development of treatment plan with patient and/or surrogate as well as nursing, discussions with consultants, evaluation of patient's response to treatment, examination of patient, obtaining history from patient or surrogate, ordering and performing treatments and interventions, ordering and review of laboratory studies, ordering and review of radiographic studies, pulse oximetry and re-evaluation of patient's condition. ? ?Timothy Lasso, MD ?See Amion for contact info ?If no response please call [phone] until 7pm ?After 7:00 pm call Elink  [phone] ? ? ?

## 2022-01-08 NOTE — Progress Notes (Signed)
Nutrition Brief Note ? ?Received MD Consult for TF initiation and management. ?Discussed patient in ICU rounds and with RN today. ?Patient was extubated this morning. ?He passed the [place] swallow screen and diet has been advanced to regular. ?Patient no longer needs enteral nutrition support. ?Per discussion with MD, he would benefit from diet education. ?Will attach heart healthy education handout to discharge instructions.  ?RD to provide verbal education at a later date and more appropriate time.  ? ?Lucas Mallow RD, LDN, CNSC ?Please refer to Amion for contact information.                                                       ? ?

## 2022-01-08 NOTE — Progress Notes (Signed)
BUN 49 and creatinine 3.30 reported to Dr Judeth Horn . Bladder scan 0 per orders. ?

## 2022-01-09 DIAGNOSIS — I890 Lymphedema, not elsewhere classified: Secondary | ICD-10-CM

## 2022-01-09 DIAGNOSIS — E785 Hyperlipidemia, unspecified: Secondary | ICD-10-CM

## 2022-01-09 DIAGNOSIS — I5023 Acute on chronic systolic (congestive) heart failure: Secondary | ICD-10-CM

## 2022-01-09 DIAGNOSIS — I10 Essential (primary) hypertension: Secondary | ICD-10-CM | POA: Diagnosis not present

## 2022-01-09 DIAGNOSIS — J9602 Acute respiratory failure with hypercapnia: Secondary | ICD-10-CM | POA: Diagnosis not present

## 2022-01-09 DIAGNOSIS — N1832 Chronic kidney disease, stage 3b: Secondary | ICD-10-CM

## 2022-01-09 DIAGNOSIS — J189 Pneumonia, unspecified organism: Secondary | ICD-10-CM

## 2022-01-09 DIAGNOSIS — N179 Acute kidney failure, unspecified: Secondary | ICD-10-CM

## 2022-01-09 DIAGNOSIS — E119 Type 2 diabetes mellitus without complications: Secondary | ICD-10-CM

## 2022-01-09 LAB — CBC WITH DIFFERENTIAL/PLATELET
Abs Immature Granulocytes: 0.01 10*3/uL (ref 0.00–0.07)
Basophils Absolute: 0 10*3/uL (ref 0.0–0.1)
Basophils Relative: 1 %
Eosinophils Absolute: 0 10*3/uL (ref 0.0–0.5)
Eosinophils Relative: 0 %
HCT: 34.4 % — ABNORMAL LOW (ref 39.0–52.0)
Hemoglobin: 10.7 g/dL — ABNORMAL LOW (ref 13.0–17.0)
Immature Granulocytes: 0 %
Lymphocytes Relative: 40 %
Lymphs Abs: 2 10*3/uL (ref 0.7–4.0)
MCH: 27.4 pg (ref 26.0–34.0)
MCHC: 31.1 g/dL (ref 30.0–36.0)
MCV: 88 fL (ref 80.0–100.0)
Monocytes Absolute: 0.4 10*3/uL (ref 0.1–1.0)
Monocytes Relative: 9 %
Neutro Abs: 2.5 10*3/uL (ref 1.7–7.7)
Neutrophils Relative %: 50 %
Platelets: 148 10*3/uL — ABNORMAL LOW (ref 150–400)
RBC: 3.91 MIL/uL — ABNORMAL LOW (ref 4.22–5.81)
RDW: 13.9 % (ref 11.5–15.5)
WBC: 5 10*3/uL (ref 4.0–10.5)
nRBC: 0 % (ref 0.0–0.2)

## 2022-01-09 LAB — BASIC METABOLIC PANEL
Anion gap: 5 (ref 5–15)
BUN: 41 mg/dL — ABNORMAL HIGH (ref 8–23)
CO2: 28 mmol/L (ref 22–32)
Calcium: 8.5 mg/dL — ABNORMAL LOW (ref 8.9–10.3)
Chloride: 105 mmol/L (ref 98–111)
Creatinine, Ser: 1.91 mg/dL — ABNORMAL HIGH (ref 0.61–1.24)
GFR, Estimated: 38 mL/min — ABNORMAL LOW (ref >60)
Glucose, Bld: 98 mg/dL (ref 70–99)
Potassium: 4.5 mmol/L (ref 3.5–5.1)
Sodium: 138 mmol/L (ref 135–145)

## 2022-01-09 LAB — MAGNESIUM: Magnesium: 2.1 mg/dL (ref 1.7–2.4)

## 2022-01-09 LAB — GLUCOSE, CAPILLARY
Glucose-Capillary: 91 mg/dL (ref 70–99)
Glucose-Capillary: 131 mg/dL — ABNORMAL HIGH (ref 70–99)
Glucose-Capillary: 113 mg/dL — ABNORMAL HIGH (ref 70–99)
Glucose-Capillary: 108 mg/dL — ABNORMAL HIGH (ref 70–99)
Glucose-Capillary: 130 mg/dL — ABNORMAL HIGH (ref 70–99)
Glucose-Capillary: 167 mg/dL — ABNORMAL HIGH (ref 70–99)

## 2022-01-09 LAB — LEGIONELLA PNEUMOPHILA SEROGP 1 UR AG: L. pneumophila Serogp 1 Ur Ag: NEGATIVE

## 2022-01-09 MED ORDER — FUROSEMIDE 10 MG/ML IJ SOLN
40.0000 mg | Freq: Once | INTRAMUSCULAR | Status: DC
Start: 2022-01-09 — End: 2022-01-09

## 2022-01-09 MED ORDER — GUAIFENESIN 100 MG/5ML PO LIQD: 5.0000 mL | ORAL | Status: DC | PRN

## 2022-01-09 MED ORDER — SENNOSIDES-DOCUSATE SODIUM 8.6-50 MG PO TABS: 1.0000 | ORAL_TABLET | Freq: Every evening | ORAL | Status: DC | PRN

## 2022-01-09 MED ORDER — CLONIDINE HCL 0.2 MG PO TABS: 0.2000 mg | ORAL_TABLET | Freq: Every day | ORAL | Status: DC

## 2022-01-09 MED ORDER — DOCUSATE SODIUM 100 MG PO CAPS
100.0000 mg | ORAL_CAPSULE | Freq: Every day | ORAL | Status: DC
  Administered 2022-01-09: 100 mg via ORAL
  Filled 2022-01-09 (×2): qty 1

## 2022-01-09 MED ORDER — ASPIRIN EC 81 MG PO TBEC: 81.0000 mg | DELAYED_RELEASE_TABLET | Freq: Every day | ORAL | Status: DC

## 2022-01-09 MED ORDER — CLONIDINE HCL 0.2 MG PO TABS
0.1000 mg | ORAL_TABLET | Freq: Once | ORAL | Status: AC
  Administered 2022-01-09: 0.1 mg via ORAL
  Filled 2022-01-09: qty 1

## 2022-01-09 MED ORDER — METOPROLOL TARTRATE 5 MG/5ML IV SOLN: 5.0000 mg | INTRAVENOUS | Status: DC | PRN

## 2022-01-09 MED ORDER — HYDRALAZINE HCL 20 MG/ML IJ SOLN
10.0000 mg | INTRAMUSCULAR | Status: DC | PRN
Start: 2022-01-09 — End: 2022-01-10
  Administered 2022-01-09 – 2022-01-10 (×4): 10 mg via INTRAVENOUS
  Filled 2022-01-09 (×4): qty 1

## 2022-01-09 MED ORDER — HYDRALAZINE HCL 50 MG PO TABS: 50.0000 mg | ORAL_TABLET | Freq: Two times a day (BID) | ORAL | Status: DC

## 2022-01-09 MED ORDER — AMLODIPINE BESYLATE 5 MG PO TABS
5.0000 mg | ORAL_TABLET | Freq: Every day | ORAL | Status: DC
  Administered 2022-01-09 – 2022-01-10 (×2): 5 mg via ORAL
  Filled 2022-01-09 (×2): qty 1

## 2022-01-09 MED ORDER — HYDRALAZINE HCL 50 MG PO TABS
50.0000 mg | ORAL_TABLET | Freq: Three times a day (TID) | ORAL | Status: DC
  Administered 2022-01-09 – 2022-01-10 (×4): 50 mg via ORAL
  Filled 2022-01-09 (×4): qty 1

## 2022-01-09 MED ORDER — INSULIN ASPART 100 UNIT/ML IJ SOLN: 0.0000 [IU] | Freq: Three times a day (TID) | INTRAMUSCULAR | Status: DC

## 2022-01-09 MED ORDER — CLONIDINE HCL 0.2 MG PO TABS
0.2000 mg | ORAL_TABLET | Freq: Two times a day (BID) | ORAL | Status: DC
Start: 2022-01-09 — End: 2022-01-10
  Administered 2022-01-09 – 2022-01-10 (×3): 0.2 mg via ORAL
  Filled 2022-01-09 (×3): qty 1

## 2022-01-09 MED ORDER — LACTATED RINGERS IV SOLN: INTRAVENOUS | Status: AC

## 2022-01-09 NOTE — Evaluation (Signed)
Occupational Therapy Evaluation/Discharge ?Patient Details ?Name: Phillip Kennedy ?MRN: [id number] ?DOB: 10/20/1956 ?Today's Date: 01/09/2022 ? ? ?History of Present Illness 66 yo admitted 3/19 with SOB with intubation 3/19-3/20. PMhx:HFrEF, CKD stage 3b, HTN, HL, DM, COPd, smoker  ? ?Clinical Impression ?  ?PTA, pt lives with family, active at baseline and Independent with ADLs, IADLs (yard work as well), and mobility. Pt presents now fairly close to ADL/mobility baseline and anticipate pt will quickly progress to PLOF for IADLs as well. Educated re: monitoring BP at home, safety precautions if BP elevated at home, optimal O2 sats and use of pulse ox to monitor sats at home. Pt verbalized understanding of all education, encouraged mobility with staff during admission to maximize endurance. No further skilled OT services needed at acute level.  ? ?SpO2 100% at rest on RA, >98% on RA with ADLs ?BP pre activity: 149/97 (112) ?BP post activity: 149/104 (117)  ?   ? ?Recommendations for follow up therapy are one component of a multi-disciplinary discharge planning process, led by the attending physician.  Recommendations may be updated based on patient status, additional functional criteria and insurance authorization.  ? ?Follow Up Recommendations ? No OT follow up  ?  ?Assistance Recommended at Discharge PRN  ?Patient can return home with the following   ? ?  ?Functional Status Assessment ? Patient has had a recent decline in their functional status and demonstrates the ability to make significant improvements in function in a reasonable and predictable amount of time.  ?Equipment Recommendations ? None recommended by OT  ?  ?Recommendations for Other Services   ? ? ?  ?Precautions / Restrictions Precautions ?Precautions: Fall;Other (comment) ?Precaution Comments: monitor O2/BP ?Restrictions ?Weight Bearing Restrictions: No  ? ?  ? ?Mobility Bed Mobility ?Overal bed mobility: Modified Independent ?  ?  ?  ?  ?  ?  ?  ?   ? ?Transfers ?Overall transfer level: Independent ?Equipment used: None ?Transfers: Sit to/from Stand ?Sit to Stand: Independent ?  ?  ?  ?  ?  ?  ?  ? ?  ?Balance Overall balance assessment: Needs assistance ?Sitting-balance support: Feet supported, No upper extremity supported ?Sitting balance-Leahy Scale: Good ?  ?  ?Standing balance support: No upper extremity supported ?Standing balance-Leahy Scale: Good ?  ?  ?  ?  ?  ?  ?  ?  ?  ?  ?  ?  ?   ? ?ADL either performed or assessed with clinical judgement  ? ?ADL Overall ADL's : Needs assistance/impaired ?Eating/Feeding: Independent ?  ?Grooming: Modified independent;Standing;Oral care;Wash/dry face ?Grooming Details (indicate cue type and reason): standing at sink, setup for lines only ?Upper Body Bathing: Independent ?  ?Lower Body Bathing: Set up;Sit to/from stand ?  ?Upper Body Dressing : Independent;Sitting ?  ?Lower Body Dressing: Set up;Sit to/from stand ?  ?Toilet Transfer: Supervision/safety;Ambulation ?  ?Toileting- Clothing Manipulation and Hygiene: Set up;Sitting/lateral lean;Sit to/from stand ?  ?  ?  ?  ?General ADL Comments: Pt fairly close to ADL/mobility baseline with minor deficits in endurance (noted HTN pre andpost activity). Pt successfully weaned to RA and tolerated well with brief ADLs in room (declined hallway mobility at this time)  ? ? ? ?Vision Baseline Vision/History: 1 Wears glasses ?Ability to See in Adequate Light: 0 Adequate ?Patient Visual Report: No change from baseline ?Vision Assessment?: No apparent visual deficits  ?   ?Perception   ?  ?Praxis   ?  ? ?Pertinent  Vitals/Pain Pain Assessment ?Pain Assessment: No/denies pain  ? ? ? ?Hand Dominance Right ?  ?Extremity/Trunk Assessment Upper Extremity Assessment ?Upper Extremity Assessment: Overall WFL for tasks assessed ?  ?Lower Extremity Assessment ?Lower Extremity Assessment: Defer to PT evaluation (R LE edema < L LE edema) ?  ?Cervical / Trunk Assessment ?Cervical / Trunk  Assessment: Normal ?  ?Communication Communication ?Communication: No difficulties ?  ?Cognition Arousal/Alertness: Awake/alert ?Behavior During Therapy: [facility] for tasks assessed/performed ?Overall Cognitive Status: Within Functional Limits for tasks assessed ?  ?  ?  ?  ?  ?  ?  ?  ?  ?  ?  ?  ?  ?  ?  ?  ?  ?  ?  ?General Comments  R LE edema > L LE edema (reports this has been ongoing for 4-5 years). Encouraged to monitor BP consistently at home (HTN baseline issue) and prior to taking BP meds. pt reports dizziness felt when BP elevated (though denies dizziness currently) at home - educated to sit, rest and check BP at home if this occurs. ALso encouraged pt to obtain pulse ox to monitor O2 if SOB at home and due to noted COPD diagnoses at all. ? ?  ?Exercises   ?  ?Shoulder Instructions    ? ? ?Home Living Family/patient expects to be discharged to:: Private residence ?Living Arrangements: Spouse/significant other;Children ?Available Help at Discharge: Family ?Type of Home: House ?Home Access: Stairs to enter ?Entrance Stairs-Number of Steps: 1 ?  ?Home Layout: One level ?  ?  ?Bathroom Shower/Tub: Tub/shower unit ?  ?Bathroom Toilet: Handicapped height ?  ?  ?Home Equipment: Conservation officer, nature (2 wheels);Cane - single point;BSC/3in1;Shower seat ?  ?  ?  ? ?  ?Prior Functioning/Environment Prior Level of Function : Independent/Modified Independent ?  ?  ?  ?  ?  ?  ?Mobility Comments: no use of AD for mobility (used DME after surgeries temporarily in the past) ?ADLs Comments: Driving, Independent in ADLs, IADLs and active (yard work, Social research officer, government) ?  ? ?  ?  ?OT Problem List: Decreased activity tolerance;Cardiopulmonary status limiting activity ?  ?   ?OT Treatment/Interventions:    ?  ?OT Goals(Current goals can be found in the care plan section) Acute Rehab OT Goals ?Patient Stated Goal: go home soon ?OT Goal Formulation: With patient ?Time For Goal Achievement: 01/23/22 ?Potential to Achieve Goals: Good  ?OT Frequency:   ?   ? ?Co-evaluation   ?  ?  ?  ?  ? ?  ?AM-PAC OT "6 Clicks" Daily Activity     ?Outcome Measure Help from another person eating meals?: None ?Help from another person taking care of personal grooming?: None ?Help from another person toileting, which includes using toliet, bedpan, or urinal?: A Little ?Help from another person bathing (including washing, rinsing, drying)?: A Little ?Help from another person to put on and taking off regular upper body clothing?: None ?Help from another person to put on and taking off regular lower body clothing?: A Little ?6 Click Score: 21 ?  ?End of Session   ? ?Activity Tolerance: Patient tolerated treatment well ?Patient left: in bed;with call bell/phone within reach ? ?OT Visit Diagnosis: Unsteadiness on feet (R26.81)  ?              ?Time: [id number] ?OT Time Calculation (min): 25 min ?Charges:  OT General Charges ?$OT Visit: 1 Visit ?OT Evaluation ?$OT Eval Moderate Complexity: 1 Mod ?OT Treatments ?$Self Care/Home Management :  8-22 mins ? ?Malachy Chamber, OTR/L ?Acute Rehab Services ?Office: [phone]  ? ?Layla Maw ?01/09/2022, 1:42 PM ?

## 2022-01-09 NOTE — Assessment & Plan Note (Signed)
Combination of reactive airway disease, pneumonia and pulmonary edema.  Initially intubated now extubated 3/20. ?

## 2022-01-09 NOTE — Assessment & Plan Note (Signed)
Home meds °

## 2022-01-09 NOTE — Progress Notes (Signed)
eLink Physician-Brief Progress Note ?Patient Name: Phillip Kennedy ?DOB: 1956/07/11 ?MRN: [id number] ? ? ?Date of Service ? 01/09/2022  ?HPI/Events of Note ? Hypertension - BP = 171/122 with MAP 137.   ?eICU Interventions ? Plan: ?Increase Hydralazine to 50 mg PO Q 8 hours.  ?Catapres 0.1 mg PO now (extra dose).  ? ? ? ?Intervention Category ?Major Interventions: Hypertension - evaluation and management ? ?Phillip Kennedy ?01/09/2022, 4:03 AM ?

## 2022-01-09 NOTE — Assessment & Plan Note (Addendum)
Baseline creatinine 1.5 ?

## 2022-01-09 NOTE — Assessment & Plan Note (Addendum)
Uncontrolled, resume home blood pressure medications.  Advised to keep a log of this and follow this up at next PCP appointment ?

## 2022-01-09 NOTE — Assessment & Plan Note (Addendum)
Renal function now at baseline around 1.5 ? ?

## 2022-01-09 NOTE — Assessment & Plan Note (Addendum)
Improving.  Lungs are clear this morning saturating greater than 95% on room air.  We will transition to oral Augmentin for 4 more days to complete 7-day course.  He can use his home Ventolin as necessary ?

## 2022-01-09 NOTE — Progress Notes (Signed)
eLink Physician-Brief Progress Note ?Patient Name: Phillip Kennedy ?DOB: [birth year] ?MRN: [id number] ? ? ?Date of Service ? 01/09/2022  ?HPI/Events of Note ? No AM lab orders -  Nursing request for AM lab orders.   ?eICU Interventions ? Will order CBC with platelets, BMP and Mg++ now.   ? ? ? ?Intervention Category ?Major Interventions: Other: ? ?[name] ?01/09/2022, 5:16 AM ?

## 2022-01-09 NOTE — Progress Notes (Signed)
PROGRESS NOTE    Phillip Kennedy  [id number] DOB: August 23, 1956 DOA: 01/07/2022 PCP: Orpah Cobb, MD   Brief Narrative:  66 year old with history of COPD, tobacco use, CHF EF 45%, CKD stage IIIb, HTN, HLD, DM 2 comes to the hospital with complaints of worsening shortness of breath 2 days prior to admission.  Initially patient was severely hypoxic, hypercarbic with pH of 7.1 requiring intubation.  CT of the head was negative.  Started on IV Solu-Medrol, IV Lasix, Rocephin and azithromycin.  Patient was taken to the ICU.   Assessment & Plan:  Principal Problem:   Respiratory failure (HCC) Active Problems:   CAP (community acquired pneumonia)   AKI (acute kidney injury) (HCC)   Acute on chronic systolic CHF (congestive heart failure) (HCC)   Stage 3b chronic kidney disease (CKD) (HCC)   DM2 (diabetes mellitus, type 2) (HCC)   Chronic acquired lymphedema   Benign essential HTN     Assessment and Plan: * Respiratory failure (HCC) Combination of reactive airway disease, pneumonia and pulmonary edema.  Initially intubated now extubated 3/20.  AKI (acute kidney injury) (HCC) Baseline 1.6.  Yesterday 3.3 Slightly worsened with Lasix.  Getting gentle hydration.   CAP (community acquired pneumonia) Empiric antibiotics IV Rocephin and azithromycin Urine strep and Legionella-pending Bronchodilators Solu-Medrol stopped Follow cultures  Acute on chronic systolic CHF (congestive heart failure) (HCC) Currently diuretics on hold due to acute kidney injury.  We will continue to monitor this. Echocardiogram December 2022 shows EF 45%, grade 1 DD. Continue aspirin  Benign essential HTN Currently on clonidine.  Half dose hydralazine Imdur metoprolol on hold  Chronic acquired lymphedema Supportive care  DM2 (diabetes mellitus, type 2) (HCC) Sliding scale and Accu-Chek  Stage 3b chronic kidney disease (CKD) (HCC) Baseline creatinine 1.7  HLD (hyperlipidemia)-resolved as of  01/09/2022 Home meds          DVT prophylaxis: Subcu heparin Code Status: Full code Family Communication:  no answer, called his wife   Status is: Inpatient Remains inpatient appropriate because: Ongoing evaluation for acute kidney injury.   Nutritional status          Body mass index is 27.86 kg/m.           Subjective: Doing better, no complaints. Mentation improved.  Review of Systems Otherwise negative except as per HPI, including: General: Denies fever, chills, night sweats or unintended weight loss. Resp: Denies cough, wheezing, shortness of breath. Cardiac: Denies chest pain, palpitations, orthopnea, paroxysmal nocturnal dyspnea. GI: Denies abdominal pain, nausea, vomiting, diarrhea or constipation GU: Denies dysuria, frequency, hesitancy or incontinence MS: Denies muscle aches, joint pain or swelling Neuro: Denies headache, neurologic deficits (focal weakness, numbness, tingling), abnormal gait Psych: Denies anxiety, depression, SI/HI/AVH Skin: Denies new rashes or lesions ID: Denies sick contacts, exotic exposures, travel  Examination:  General exam: Appears calm and comfortable  Respiratory system: Clear to auscultation. Respiratory effort normal. Cardiovascular system: S1 & S2 heard, RRR. No JVD, murmurs, rubs, gallops or clicks. No pedal edema. Gastrointestinal system: Abdomen is nondistended, soft and nontender. No organomegaly or masses felt. Normal bowel sounds heard. Central nervous system: Alert and oriented. No focal neurological deficits. Extremities: Symmetric 5 x 5 power. Skin: No rashes, lesions or ulcers Psychiatry: Judgement and insight appear normal. Mood & affect appropriate.     Objective: Vitals:   01/09/22 0500 01/09/22 0600 01/09/22 0603 01/09/22 0700  BP:  (!) 169/121 (!) 165/126 (!) 151/92  Pulse:  68  66  Resp:  18  18  Temp:      TempSrc:      SpO2:  98%  100%  Weight: 90.6 kg     Height:         Intake/Output Summary (Last 24 hours) at 01/09/2022 0738 Last data filed at 01/09/2022 0449 Gross per 24 hour  Intake 2140.1 ml  Output 2350 ml  Net -209.9 ml   Filed Weights   01/07/22 0500 01/08/22 0500 01/09/22 0500  Weight: 98.2 kg 91.5 kg 90.6 kg     Data Reviewed:   CBC: Recent Labs  Lab 01/07/22 0309 01/07/22 0327 01/07/22 0440 01/07/22 0854 01/08/22 0425 01/08/22 0729 01/09/22 0603  WBC 6.9  --   --   --   --  6.1 5.0  NEUTROABS 4.3  --   --   --   --   --  2.5  HGB 12.4*   < > 11.6* 11.2* 10.2* 11.0* 10.7*  HCT 40.9   < > 34.0* 33.0* 30.0* 35.0* 34.4*  MCV 90.5  --   --   --   --  87.9 88.0  PLT 210  --   --   --   --  168 148*   < > = values in this interval not displayed.   Basic Metabolic Panel: Recent Labs  Lab 01/07/22 0309 01/07/22 0327 01/07/22 0421 01/07/22 0440 01/07/22 0854 01/07/22 1641 01/08/22 0425 01/08/22 0729 01/09/22 0603  NA 138   < > 136 141 140  --  139 140 138  K 4.7   < > 5.2* 5.0 4.5  --  5.2* 5.1 4.5  CL 106  --  106  --   --   --   --  106 105  CO2 21*  --  22  --   --   --   --  23 28  GLUCOSE 160*  --  131*  --   --   --   --  115* 98  BUN 22  --  24*  --   --   --   --  49* 41*  CREATININE 1.70*  --  1.66*  --   --   --   --  3.30* 1.91*  CALCIUM 9.0  --  8.3*  --   --   --   --  8.9 8.5*  MG  --   --  2.4  --   --  2.4  --  2.5* 2.1  PHOS  --   --  5.7*  --   --  5.0*  --  5.3*  --    < > = values in this interval not displayed.   GFR: Estimated Creatinine Clearance: 44.4 mL/min (A) (by C-G formula based on SCr of 1.91 mg/dL (H)). Liver Function Tests: No results for input(s): AST, ALT, ALKPHOS, BILITOT, PROT, ALBUMIN in the last 168 hours. No results for input(s): LIPASE, AMYLASE in the last 168 hours. No results for input(s): AMMONIA in the last 168 hours. Coagulation Profile: No results for input(s): INR, PROTIME in the last 168 hours. Cardiac Enzymes: No results for input(s): CKTOTAL, CKMB, CKMBINDEX,  TROPONINI in the last 168 hours. BNP (last 3 results) No results for input(s): PROBNP in the last 8760 hours. HbA1C: Recent Labs    01/07/22 1425  HGBA1C 7.0*   CBG: Recent Labs  Lab 01/08/22 1117 01/08/22 1521 01/08/22 1930 01/08/22 2338 01/09/22 0342  GLUCAP 92 113* 149* 89 91   Lipid Profile: Recent Labs  01/08/22 0729  TRIG 72   Thyroid Function Tests: No results for input(s): TSH, T4TOTAL, FREET4, T3FREE, THYROIDAB in the last 72 hours. Anemia Panel: No results for input(s): VITAMINB12, FOLATE, FERRITIN, TIBC, IRON, RETICCTPCT in the last 72 hours. Sepsis Labs: Recent Labs  Lab 01/07/22 0309  LATICACIDVEN 1.6    Recent Results (from the past 240 hour(s))  Resp Panel by RT-PCR (Flu A&B, Covid) Nasopharyngeal Swab     Status: None   Collection Time: 01/07/22  2:25 AM   Specimen: Nasopharyngeal Swab; Nasopharyngeal(NP) swabs in vial transport medium  Result Value Ref Range Status   SARS Coronavirus 2 by RT PCR NEGATIVE NEGATIVE Final    Comment: (NOTE) SARS-CoV-2 target nucleic acids are NOT DETECTED.  The SARS-CoV-2 RNA is generally detectable in upper respiratory specimens during the acute phase of infection. The lowest concentration of SARS-CoV-2 viral copies this assay can detect is 138 copies/mL. A negative result does not preclude SARS-Cov-2 infection and should not be used as the sole basis for treatment or other patient management decisions. A negative result may occur with  improper specimen collection/handling, submission of specimen other than nasopharyngeal swab, presence of viral mutation(s) within the areas targeted by this assay, and inadequate number of viral copies(<138 copies/mL). A negative result must be combined with clinical observations, patient history, and epidemiological information. The expected result is Negative.  Fact Sheet for Patients:  BloggerCourse.com  Fact Sheet for Healthcare Providers:   SeriousBroker.it  This test is no t yet approved or cleared by the [place] FDA and  has been authorized for detection and/or diagnosis of SARS-CoV-2 by FDA under an Emergency Use Authorization (EUA). This EUA will remain  in effect (meaning this test can be used) for the duration of the COVID-19 declaration under Section 564(b)(1) of the Act, 21 U.S.C.section 360bbb-3(b)(1), unless the authorization is terminated  or revoked sooner.       Influenza A by PCR NEGATIVE NEGATIVE Final   Influenza B by PCR NEGATIVE NEGATIVE Final    Comment: (NOTE) The Xpert Xpress SARS-CoV-2/FLU/RSV plus assay is intended as an aid in the diagnosis of influenza from Nasopharyngeal swab specimens and should not be used as a sole basis for treatment. Nasal washings and aspirates are unacceptable for Xpert Xpress SARS-CoV-2/FLU/RSV testing.  Fact Sheet for Patients: BloggerCourse.com  Fact Sheet for Healthcare Providers: SeriousBroker.it  This test is not yet approved or cleared by the [place] FDA and has been authorized for detection and/or diagnosis of SARS-CoV-2 by FDA under an Emergency Use Authorization (EUA). This EUA will remain in effect (meaning this test can be used) for the duration of the COVID-19 declaration under Section 564(b)(1) of the Act, 21 U.S.C. section 360bbb-3(b)(1), unless the authorization is terminated or revoked.  Performed at [facility], [address] [place], [place] [address]   Blood culture (routine x 2)     Status: None (Preliminary result)   Collection Time: 01/07/22  2:35 AM   Specimen: BLOOD LEFT FOREARM  Result Value Ref Range Status   Specimen Description BLOOD LEFT FOREARM  Final   Special Requests   Final    BOTTLES DRAWN AEROBIC AND ANAEROBIC Blood Culture adequate volume   Culture   Final    NO GROWTH 2 DAYS Performed at [facility], [address] [place], [place] [address]    Report Status PENDING  Incomplete  Blood culture (routine x 2)     Status: None (Preliminary result)  Collection Time: 01/07/22  2:35 AM   Specimen: BLOOD RIGHT FOREARM  Result Value Ref Range Status   Specimen Description BLOOD RIGHT FOREARM  Final   Special Requests   Final    BOTTLES DRAWN AEROBIC AND ANAEROBIC Blood Culture adequate volume   Culture   Final    NO GROWTH 2 DAYS Performed at [facility], [address] [place], [place] [address]    Report Status PENDING  Incomplete  Urine Culture     Status: None   Collection Time: 01/07/22  3:42 AM   Specimen: Urine, Catheterized  Result Value Ref Range Status   Specimen Description URINE, CATHETERIZED  Final   Special Requests NONE  Final   Culture   Final    NO GROWTH Performed at [facility], [address], [place], [place] [address]    Report Status 01/08/2022 FINAL  Final  MRSA Next Gen by PCR, Nasal     Status: None   Collection Time: 01/07/22  6:47 AM  Result Value Ref Range Status   MRSA by PCR Next Gen NOT DETECTED NOT DETECTED Final    Comment: (NOTE) The GeneXpert MRSA Assay (FDA approved for NASAL specimens only), is one component of a comprehensive MRSA colonization surveillance program. It is not intended to diagnose MRSA infection nor to guide or monitor treatment for MRSA infections. Test performance is not FDA approved in patients less than 52 years old. Performed at [facility], [address] [place], [place] [address]          Radiology Studies: No results found.      Scheduled Meds:  arformoterol  15 mcg Nebulization BID   aspirin  81 mg Oral Daily   Chlorhexidine Gluconate Cloth  6 each Topical Daily   cloNIDine  0.2 mg Oral BID   furosemide  40 mg Intravenous Once   heparin  5,000 Units Subcutaneous Q8H   hydrALAZINE  50 mg Oral Q8H   insulin aspart  0-9 Units Subcutaneous Q4H   revefenacin  175 mcg Nebulization Daily    Continuous Infusions:  lactated ringers 75 mL/hr at 01/09/22 0400     LOS: 2 days   Time spent= 35 mins    Noha Karasik Joline Maxcy, MD [facility]  If 7PM-7AM, please contact night-coverage  01/09/2022, 7:38 AM

## 2022-01-09 NOTE — Progress Notes (Signed)
eLink Physician-Brief Progress Note ?Patient Name: Phillip Kennedy ?DOB: [birth year] ?MRN: [id number] ? ? ?Date of Service ? 01/09/2022  ?HPI/Events of Note ? Remains hypertensive.   ?eICU Interventions ? Plan: ?Lasix 40 mg IV X 1 now. ?Increase Carapres to 0.2 mg PO BID. ?Hydralazine 10 mg IV Q 4 hours PRN SBP > 170 or DBP > 105.  ? ? ? ?Intervention Category ?Major Interventions: Hypertension - evaluation and management ? ?Michalle Rademaker Cornelia Copa ?01/09/2022, 6:44 AM ?

## 2022-01-09 NOTE — Assessment & Plan Note (Signed)
Supportive care. 

## 2022-01-09 NOTE — Assessment & Plan Note (Addendum)
Renal function improved, resume home diuretics ?Echocardiogram December 2022 shows EF 45%, grade 1 DD. ?Continue aspirin ?

## 2022-01-09 NOTE — Assessment & Plan Note (Addendum)
Resume his home medications ?

## 2022-01-10 ENCOUNTER — Other Ambulatory Visit (HOSPITAL_COMMUNITY): Payer: Self-pay

## 2022-01-10 DIAGNOSIS — I5023 Acute on chronic systolic (congestive) heart failure: Secondary | ICD-10-CM | POA: Diagnosis not present

## 2022-01-10 DIAGNOSIS — I10 Essential (primary) hypertension: Secondary | ICD-10-CM | POA: Diagnosis not present

## 2022-01-10 DIAGNOSIS — E119 Type 2 diabetes mellitus without complications: Secondary | ICD-10-CM

## 2022-01-10 DIAGNOSIS — N179 Acute kidney failure, unspecified: Secondary | ICD-10-CM | POA: Diagnosis not present

## 2022-01-10 DIAGNOSIS — J189 Pneumonia, unspecified organism: Secondary | ICD-10-CM

## 2022-01-10 DIAGNOSIS — J9602 Acute respiratory failure with hypercapnia: Secondary | ICD-10-CM | POA: Diagnosis not present

## 2022-01-10 LAB — BASIC METABOLIC PANEL
Anion gap: 7 (ref 5–15)
BUN: 29 mg/dL — ABNORMAL HIGH (ref 8–23)
CO2: 25 mmol/L (ref 22–32)
Calcium: 9 mg/dL (ref 8.9–10.3)
Chloride: 107 mmol/L (ref 98–111)
Creatinine, Ser: 1.38 mg/dL — ABNORMAL HIGH (ref 0.61–1.24)
GFR, Estimated: 57 mL/min — ABNORMAL LOW (ref >60)
Glucose, Bld: 112 mg/dL — ABNORMAL HIGH (ref 70–99)
Potassium: 4.2 mmol/L (ref 3.5–5.1)
Sodium: 139 mmol/L (ref 135–145)

## 2022-01-10 LAB — MAGNESIUM: Magnesium: 2 mg/dL (ref 1.7–2.4)

## 2022-01-10 LAB — GLUCOSE, CAPILLARY: Glucose-Capillary: 109 mg/dL — ABNORMAL HIGH (ref 70–99)

## 2022-01-10 LAB — BRAIN NATRIURETIC PEPTIDE: B Natriuretic Peptide: 211.6 pg/mL — ABNORMAL HIGH (ref 0.0–100.0)

## 2022-01-10 MED ORDER — ACETAMINOPHEN 325 MG PO TABS
650.0000 mg | ORAL_TABLET | Freq: Four times a day (QID) | ORAL | Status: DC | PRN
  Administered 2022-01-10: 650 mg via ORAL

## 2022-01-10 MED ORDER — AMOXICILLIN-POT CLAVULANATE 875-125 MG PO TABS
1.0000 | ORAL_TABLET | Freq: Two times a day (BID) | ORAL | 0 refills | Status: AC
  Filled 2022-01-10: qty 8, 4d supply, fill #0

## 2022-01-10 MED ORDER — ACETAMINOPHEN 325 MG PO TABS
ORAL_TABLET | ORAL | Status: AC
  Filled 2022-01-10: qty 2

## 2022-01-10 NOTE — Progress Notes (Addendum)
Physical Therapy Treatment ?Patient Details ?Name: Phillip Kennedy ?MRN: [id number] ?DOB: [birth year] ?Today's Date: 01/10/2022 ? ? ?History of Present Illness 65 yo admitted 3/19 with SOB with intubation 3/19-3/20. PMhx:HFrEF, CKD stage 3b, HTN, HL, DM, COPd, smoker ? ?  ?PT Comments  ? ? Pt received in bed, hopeful for d/c home today pending BP control. Pt demonstrates independence with in room mobility. Unable to address hallway ambulation due to elevated BP. RN aware and reports BP medicine has not been given. Suspect pt will present without difficulty for increased gait distances once BP under control. ? ?                                            BP ?Supine prior to mob         152/111 ?Sitting                                174/145  ?Standing                            187/141 ?3-4 min in supine              179/135 ?  ?Recommendations for follow up therapy are one component of a multi-disciplinary discharge planning process, led by the attending physician.  Recommendations may be updated based on patient status, additional functional criteria and insurance authorization. ? ?Follow Up Recommendations ? No PT follow up ?  ?  ?Assistance Recommended at Discharge Intermittent Supervision/Assistance  ?Patient can return home with the following Assistance with cooking/housework ?  ?Equipment Recommendations ? None recommended by PT  ?  ?Recommendations for Other Services   ? ? ?  ?Precautions / Restrictions Precautions ?Precautions: Other (comment) ?Precaution Comments: monitor O2/BP  ?  ? ?Mobility ? Bed Mobility ?Overal bed mobility: Modified Independent ?  ?  ?  ?  ?  ?  ?General bed mobility comments: HOB elevated ?  ? ?Transfers ?Overall transfer level: Independent ?Equipment used: None ?  ?  ?  ?  ?  ?  ?  ?  ?  ? ?Ambulation/Gait ?  ?  ?  ?  ?  ?  ?  ?General Gait Details: unable due to elevated BP ? ? ?Stairs ?  ?  ?  ?  ?  ? ? ?Wheelchair Mobility ?  ? ?Modified Rankin (Stroke Patients Only) ?  ? ? ?   ?Balance Overall balance assessment: No apparent balance deficits (not formally assessed) ?  ?  ?  ?  ?  ?  ?  ?  ?  ?  ?  ?  ?  ?  ?  ?  ?  ?  ?  ? ?  ?Cognition Arousal/Alertness: Awake/alert ?Behavior During Therapy: [facility] for tasks assessed/performed ?Overall Cognitive Status: Within Functional Limits for tasks assessed ?  ?  ?  ?  ?  ?  ?  ?  ?  ?  ?  ?  ?  ?  ?  ?  ?  ?  ?  ? ?  ?Exercises   ? ?  ?General Comments General comments (skin integrity, edema, etc.): BP supine prior to moblity 152/111, sitting/standing bedside 174/145 and 187/141, 3-4 minute rest in supine 179/135. RN notified ?  ?  ? ?  Pertinent Vitals/Pain Pain Assessment ?Pain Assessment: No/denies pain  ? ? ?Home Living   ?  ?  ?  ?  ?  ?  ?  ?  ?  ?   ?  ?Prior Function    ?  ?  ?   ? ?PT Goals (current goals can now be found in the care plan section) Acute Rehab PT Goals ?Patient Stated Goal: home ?Progress towards PT goals: Progressing toward goals ? ?  ?Frequency ? ? ? Min 3X/week ? ? ? ?  ?PT Plan Current plan remains appropriate  ? ? ?Co-evaluation   ?  ?  ?  ?  ? ?  ?AM-PAC PT "6 Clicks" Mobility   ?Outcome Measure ? Help needed turning from your back to your side while in a flat bed without using bedrails?: None ?Help needed moving from lying on your back to sitting on the side of a flat bed without using bedrails?: None ?Help needed moving to and from a bed to a chair (including a wheelchair)?: None ?Help needed standing up from a chair using your arms (e.g., wheelchair or bedside chair)?: None ?Help needed to walk in hospital room?: A Little ?Help needed climbing 3-5 steps with a railing? : A Little ?6 Click Score: 22 ? ?  ?End of Session   ?Activity Tolerance: Treatment limited secondary to medical complications (Comment) (HTN) ?Patient left: in bed;with call bell/phone within reach;with nursing/sitter in room ?Nurse Communication: Mobility status ?PT Visit Diagnosis: Other abnormalities of gait and mobility (R26.89);Difficulty in  walking, not elsewhere classified (R26.2) ?  ? ? ?Time: [id number] ?PT Time Calculation (min) (ACUTE ONLY): 15 min ? ?Charges:  $Therapeutic Activity: 8-22 mins          ?          ? ?Lorrin Goodell, PT  ?Office # [phone] ?Pager [phone] ? ? ? ?Lorriane Shire ?01/10/2022, 10:53 AM ? ?

## 2022-01-10 NOTE — Discharge Summary (Signed)
Physician Discharge Summary  66 year old with history of COPD, tobacco use, CHF EF 45%, CKD stage IIIb, HTN, HLD, DM 2 comes to the hospital with complaints of worsening shortness of breath 2 days prior to admission. DOA: 01/07/2022 ? ?PCP: [name], MD ? ?Admit date: 01/07/2022 ?Discharge date: 01/10/2022 ? ?Admitted From: Home ?Disposition:   Home ? ?Recommendations for Outpatient Follow-up:  ?Follow up with PCP in 1-2 weeks ?Please obtain BMP/CBC in one week your next doctors visit.  ?Augmentin for 4 more days ?Advised to resume his home blood pressure medications.  Continue to closely monitor.  Keep a log of this and follow this up at the next PCP appointment ? ? ?Discharge Condition: Stable ?CODE STATUS: Full code ?Diet recommendation: Heart healthy ? ?Brief/Interim Summary: ?66 year old with history of COPD, tobacco use, CHF EF 45%, CKD stage IIIb, HTN, HLD, DM 2 comes to the hospital with complaints of worsening shortness of breath 2 days prior to admission.  Initially patient was severely hypoxic, hypercarbic with pH of 7.1 requiring intubation.  CT of the head was negative.  Started on IV Solu-Medrol, IV Lasix, Rocephin and azithromycin.  Patient was taken to the ICU.  His stay was complicated by AKI therefore his diuretics were stopped.  His renal function returned back to baseline, was extubated on 01/08/2022.  He was weaned down to room air and feeling much better.  He was seen by PT/OT who recommended no further follow-up necessary. ?Today is medically stable for discharge with outpatient recommendations as stated above. ? ? ?Assessment and Plan: ?* Respiratory failure ([place]) ?Combination of reactive airway disease, pneumonia and pulmonary edema.  Initially intubated now extubated 3/20. ? ?AKI (acute kidney injury) ([place]) ?Renal function now at baseline around 1.5 ? ? ?CAP (community acquired pneumonia) ?Improving.  Lungs are clear this morning saturating greater than 95% on room air.  We will transition to oral Augmentin for 4 more days to complete 7-day course.  He can use his home Ventolin as necessary ? ?Acute on chronic systolic CHF  (congestive heart failure) ([place]) ?Renal function improved, resume home diuretics ?Echocardiogram December 2022 shows EF 45%, grade 1 DD. ?Continue aspirin ? ?Benign essential HTN ?Uncontrolled, resume home blood pressure medications.  Advised to keep a log of this and follow this up at next PCP appointment ? ?Chronic acquired lymphedema ?Supportive care ? ?DM2 (diabetes mellitus, type 2) ([place]) ?Resume his home medications ? ?Stage 3b chronic kidney disease (CKD) ([place]) ?Baseline creatinine 1.5 ? ?HLD (hyperlipidemia)-resolved as of 01/09/2022 ?Home meds ? ? ? ? ?  ?Body mass index is 27.55 kg/m?. ? ?  ? ? ? ?Discharge Diagnoses:  ?Principal Problem: ?  Respiratory failure ([place]) ?Active Problems: ?  CAP (community acquired pneumonia) ?  AKI (acute kidney injury) ([place]) ?  Acute on chronic systolic CHF (congestive heart failure) ([place]) ?  Stage 3b chronic kidney disease (CKD) ([place]) ?  DM2 (diabetes mellitus, type 2) ([place]) ?  Chronic acquired lymphedema ?  Benign essential HTN ? ? ? ? ? ?Consultations: ?Critical care ? ?Subjective: ?Feels great no complaints.  Slightly elevated blood pressure this morning which improved after receiving home p.o. medications. ? ?Discharge Exam: ?Vitals:  ? 01/10/22 0850 01/10/22 1001  ?BP: (!) 152/111 (!) 179/135  ?Pulse: 79 74  ?Resp: (!) 47 19  ?Temp:    ?SpO2: 90% 99%  ? ?Vitals:  ? 01/10/22 0734 01/10/22 0800 01/10/22 0850 01/10/22 1001  ?BP:  (!) 198/138 (!) 152/111 (!) 179/135  ?Pulse:  83 79 74  ?Resp:  (!) 21 (!) 47 19  ?Temp: 98.2 ?F (36.8 ?C)     ?TempSrc: Oral     ?  SpO2:  100% 90% 99%  ?Weight:      ?Height:      ? ? ?General: Pt is alert, awake, not in acute distress ?Cardiovascular: RRR, S1/S2 +, no rubs, no gallops ?Respiratory: CTA bilaterally, no wheezing, no rhonchi ?Abdominal: Soft, NT, ND, bowel sounds + ?Extremities: no edema, no cyanosis ? ?Discharge Instructions ? ? ?Allergies as of 01/10/2022   ?No Known Allergies ?  ? ?  ?Medication List  ?  ? ?TAKE these medications    ? ?amLODipine 5 MG tablet ?Commonly known as: NORVASC ?Take 5 mg by mouth daily. ?  ?amoxicillin-clavulanate 875-125 MG tablet ?Commonly known as: Augmentin ?Take 1 tablet by mouth every 12 (twelve) hours for 4 days. ?  ?aspirin EC 81 MG tablet ?Take 81 mg by mouth daily. Swallow whole. ?  ?cloNIDine 0.2 MG tablet ?Commonly known as: CATAPRES ?Take 0.2 mg by mouth in the morning and at bedtime. ?  ?docusate sodium 100 MG capsule ?Commonly known as: COLACE ?Take 100 mg by mouth daily. ?  ?hydrALAZINE 50 MG tablet ?Commonly known as: APRESOLINE ?Take 50 mg by mouth 2 (two) times daily. ?  ?isosorbide mononitrate 120 MG 24 hr tablet ?Commonly known as: IMDUR ?Take 120 mg by mouth daily. ?  ?torsemide 20 MG tablet ?Commonly known as: DEMADEX ?Take 20 mg by mouth See admin instructions. 20mg  on Monday's and Thursday's as needed for fluid ?  ?Ventolin HFA 108 (90 Base) MCG/ACT inhaler ?Generic drug: albuterol ?Inhale 2 puffs into the lungs 4 (four) times daily as needed for wheezing or shortness of breath. ?  ? ?  ? ? Follow-up Information   ? ? [name], MD Follow up in 1 week(s).   ?Specialty: Cardiology ?Contact information: ?[address] ?[address] ?[name] [address] ?[phone] ? ? ?  ?  ? ?  ?  ? ?  ? ?No Known Allergies ? ?You were cared for by a hospitalist during your hospital stay. If you have any questions about your discharge medications or the care you received while you were in the hospital after you are discharged, you can call the unit and asked to speak with the hospitalist on call if the hospitalist that took care of you is not available. Once you are discharged, your primary care physician will handle any further medical issues. Please note that no refills for any discharge medications will be authorized once you are discharged, as it is imperative that you return to your primary care physician (or establish a relationship with a primary care physician if you do not have one) for your aftercare  needs so that they can reassess your need for medications and monitor your lab values. ? ? ?Procedures/Studies: ?CT HEAD WO CONTRAST (5MM) ? ?Result Date: 01/07/2022 ?CLINICAL DATA:  Delirium. EXAM: CT HEAD WITHOUT CONTRAST TECHNIQUE: Contiguous axial images were obtained from the base of the skull through the vertex without intravenous contrast. RADIATION DOSE REDUCTION: This exam was performed according to the departmental dose-optimization program which includes automated exposure control, adjustment of the mA and/or kV according to patient size and/or use of iterative reconstruction technique. COMPARISON:  10/09/2021. FINDINGS: Brain: No acute intracranial hemorrhage, midline shift or mass effect. No extra-axial fluid collection. Subcortical and periventricular white matter hypodensities are noted bilaterally. No hydrocephalus. Vascular: No hyperdense vessel or unexpected calcification. Skull: Normal. Negative for fracture or focal lesion. Sinuses/Orbits: There is partial opacification of the ethmoid air cells and maxillary sinuses bilaterally. Mild mucosal thickening is present in  the sphenoid sinuses. The globes are within normal limits. There is an old fracture of the medial wall of the left orbit. Other: Tubes are present in the oral cavity. IMPRESSION: 1. No acute intracranial process. 2. Extensive chronic microvascular ischemic changes. 3. Moderate paranasal sinus disease. Electronically Signed   By: Brett Fairy M.D.   On: 01/07/2022 03:11  ? ?DG Chest Port 1 View ? ?Result Date: 01/07/2022 ?CLINICAL DATA:  Dyspnea EXAM: PORTABLE CHEST 1 VIEW COMPARISON:  10/10/2021 FINDINGS: Endotracheal tube is seen 8.2 cm above the carina, at the level of the clavicular heads. Nasogastric tube extends into the upper abdomen beyond the margin of the examination. The lungs are symmetrically well expanded. Diffuse interstitial and airspace infiltrate, more severe throughout the right lung, is present, asymmetric pulmonary  edema versus infection. No pneumothorax or pleural effusion. Cardiac size appears mildly enlarged. 2.4 cm metallic density of unclear significance overlies the superior vena cava, possibly representing a

## 2022-01-12 LAB — CULTURE, BLOOD (ROUTINE X 2)
Culture: NO GROWTH
Culture: NO GROWTH
Special Requests: ADEQUATE
Special Requests: ADEQUATE

## 2022-05-23 IMAGING — DX DG CHEST 2V
2 series · 2 of 2 positions shown · non-contrast
Comparison: 12/09/2016

CLINICAL DATA: 64-year-old male with COPD

EXAM:
CHEST - 2 VIEW

[chest pa]
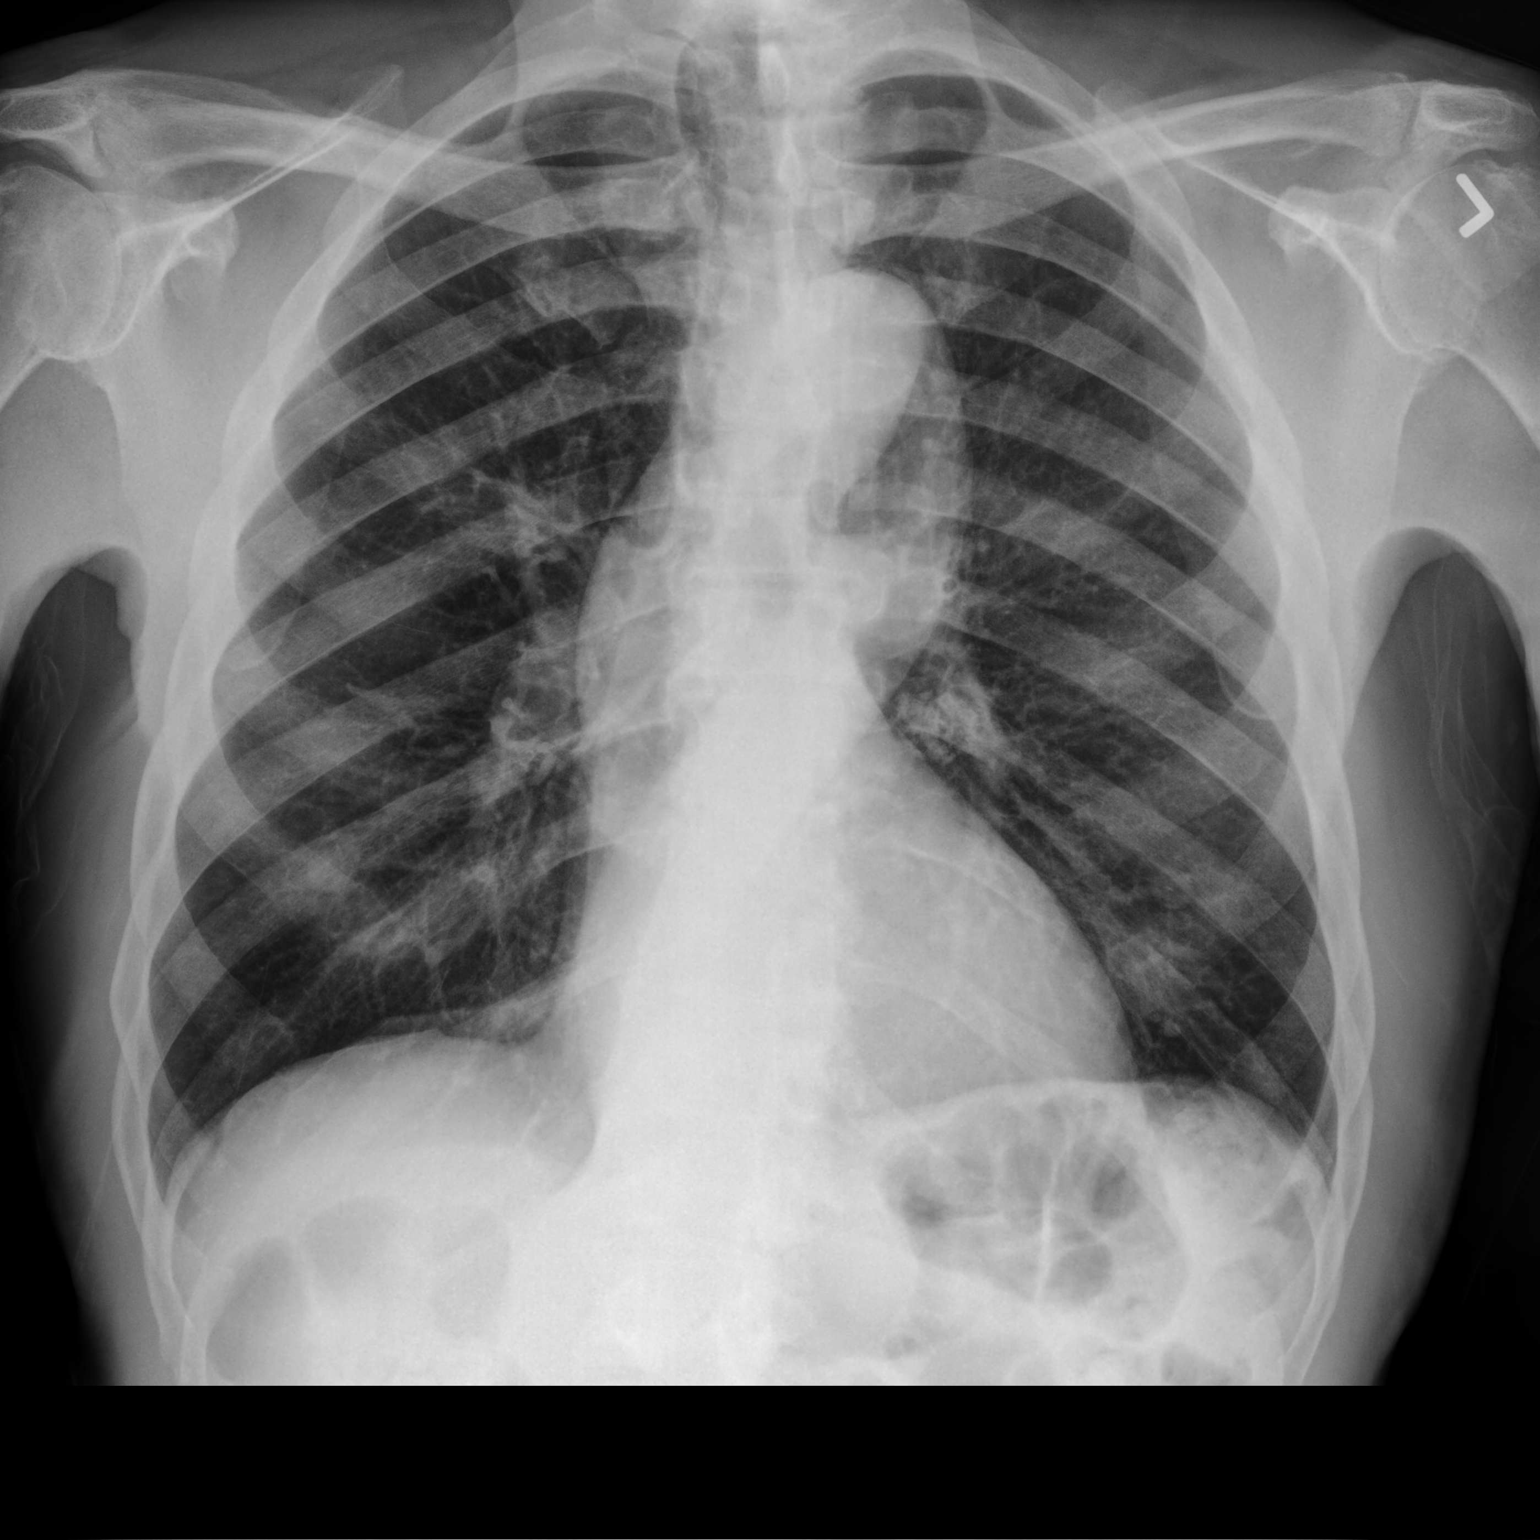

[chest lat]
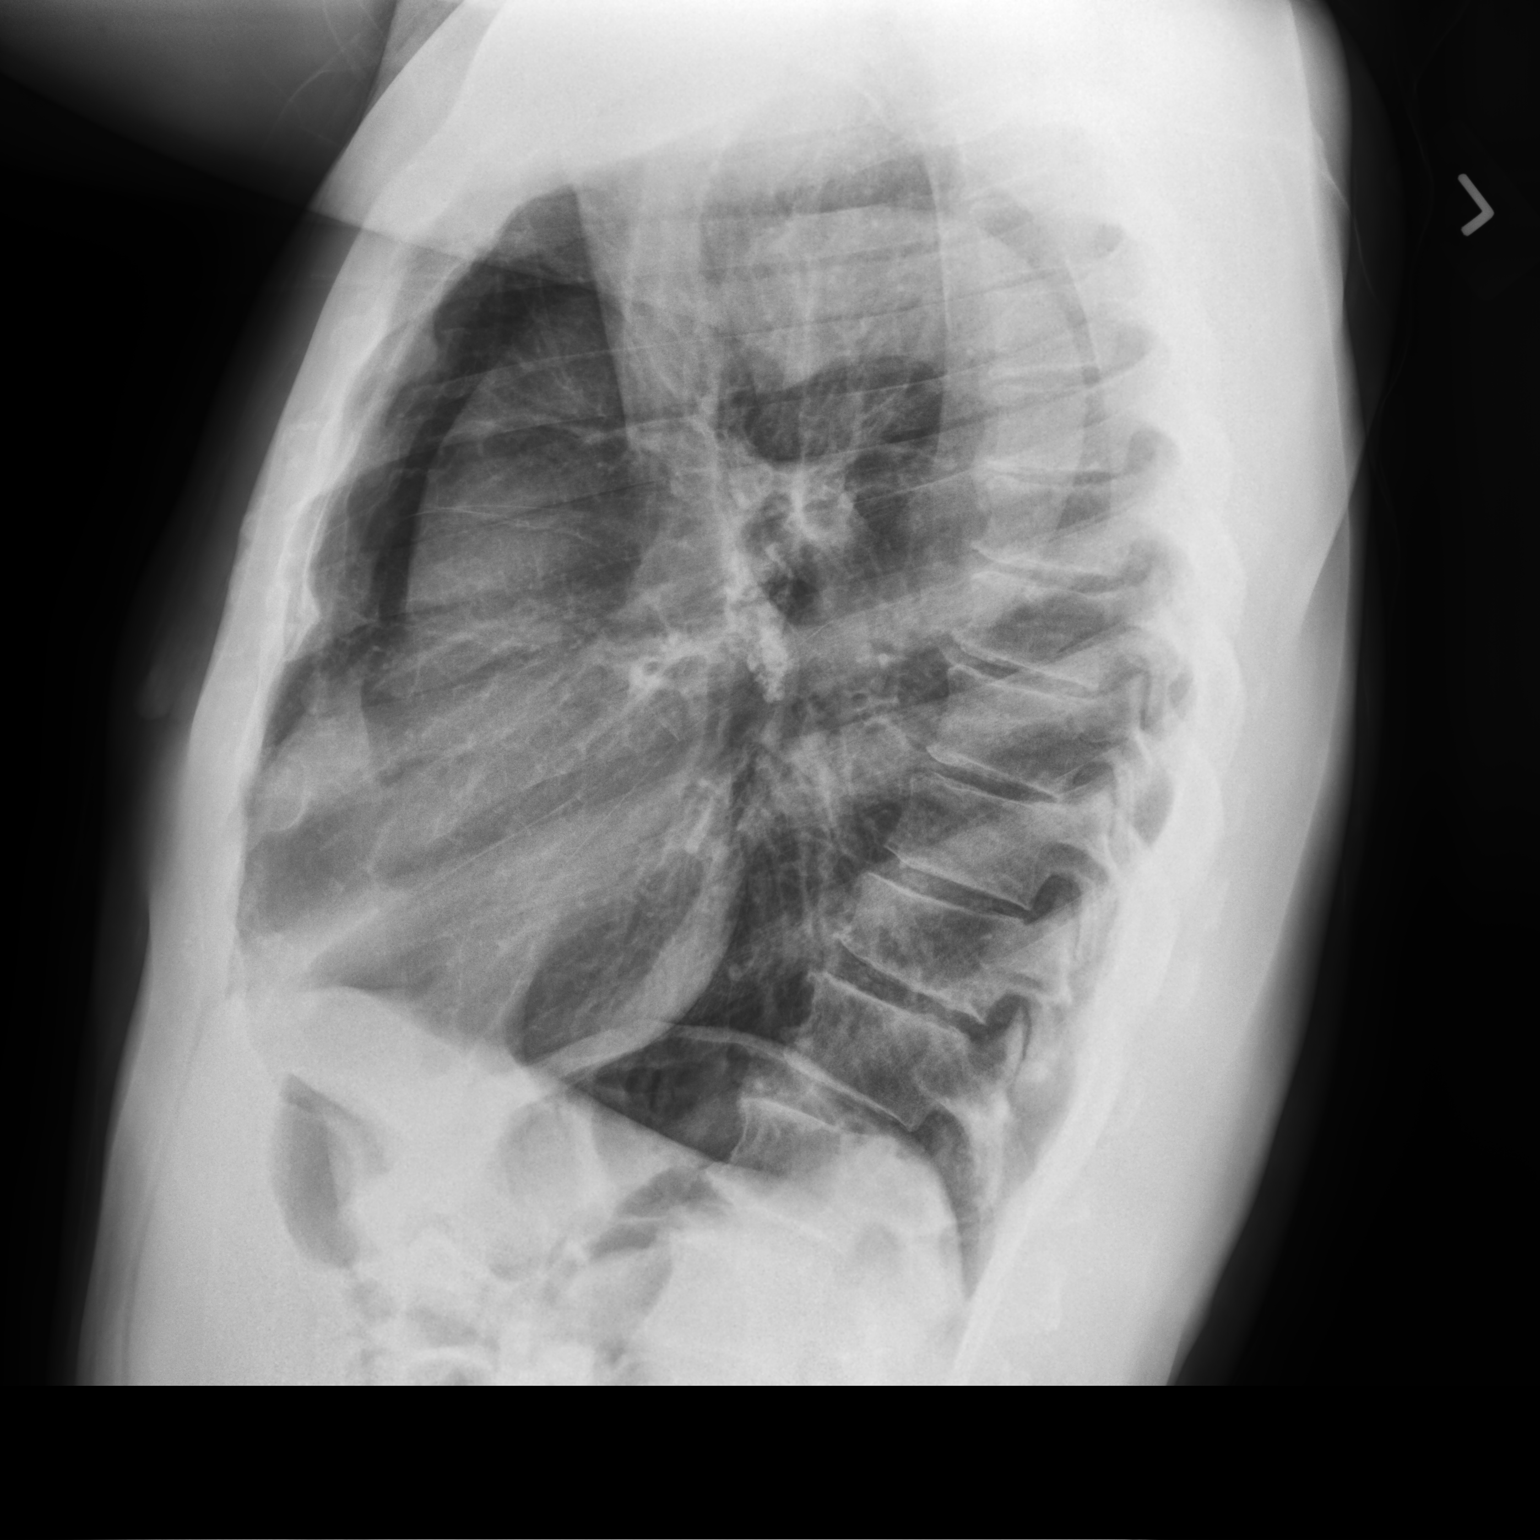

[2 of 2 positions shown; findings below may reference images not displayed]

FINDINGS: Cardiomediastinal silhouette unchanged with tortuous thoracic aorta.
No evidence of central vascular congestion. No pneumothorax or
pleural effusion. No confluent airspace disease. Stigmata of
emphysema, with increased retrosternal airspace, flattened
hemidiaphragms, increased AP diameter, and hyperinflation on the AP
view.

No acute displaced fracture
IMPRESSION: Emphysema without evidence of acute cardiopulmonary disease

## 2022-07-10 ENCOUNTER — Other Ambulatory Visit: Payer: Self-pay

## 2022-07-10 ENCOUNTER — Emergency Department (HOSPITAL_COMMUNITY): Payer: Medicare Other

## 2022-07-10 ENCOUNTER — Inpatient Hospital Stay (HOSPITAL_COMMUNITY)
Admission: EM | Admit: 2022-07-10 | Discharge: 2022-07-15 | DRG: 208 | Disposition: A | Discharge status: Home / Self Care | Payer: Medicare Other | Attending: Internal Medicine | Admitting: Internal Medicine

## 2022-07-10 ENCOUNTER — Inpatient Hospital Stay (HOSPITAL_COMMUNITY): Payer: Medicare Other

## 2022-07-10 ENCOUNTER — Encounter (HOSPITAL_COMMUNITY): Payer: Self-pay

## 2022-07-10 DIAGNOSIS — N1831 Chronic kidney disease, stage 3a: Secondary | ICD-10-CM | POA: Diagnosis not present

## 2022-07-10 DIAGNOSIS — I10 Essential (primary) hypertension: Secondary | ICD-10-CM | POA: Diagnosis present

## 2022-07-10 DIAGNOSIS — I959 Hypotension, unspecified: Secondary | ICD-10-CM | POA: Diagnosis not present

## 2022-07-10 DIAGNOSIS — E1165 Type 2 diabetes mellitus with hyperglycemia: Secondary | ICD-10-CM | POA: Diagnosis not present

## 2022-07-10 DIAGNOSIS — J189 Pneumonia, unspecified organism: Secondary | ICD-10-CM | POA: Diagnosis present

## 2022-07-10 DIAGNOSIS — Z7984 Long term (current) use of oral hypoglycemic drugs: Secondary | ICD-10-CM

## 2022-07-10 DIAGNOSIS — J440 Chronic obstructive pulmonary disease with acute lower respiratory infection: Secondary | ICD-10-CM | POA: Diagnosis not present

## 2022-07-10 DIAGNOSIS — J9601 Acute respiratory failure with hypoxia: Principal | ICD-10-CM | POA: Diagnosis present

## 2022-07-10 DIAGNOSIS — R069 Unspecified abnormalities of breathing: Secondary | ICD-10-CM | POA: Diagnosis not present

## 2022-07-10 DIAGNOSIS — I2510 Atherosclerotic heart disease of native coronary artery without angina pectoris: Secondary | ICD-10-CM | POA: Diagnosis not present

## 2022-07-10 DIAGNOSIS — I5043 Acute on chronic combined systolic (congestive) and diastolic (congestive) heart failure: Secondary | ICD-10-CM | POA: Diagnosis present

## 2022-07-10 DIAGNOSIS — E1122 Type 2 diabetes mellitus with diabetic chronic kidney disease: Secondary | ICD-10-CM | POA: Diagnosis not present

## 2022-07-10 DIAGNOSIS — E440 Moderate protein-calorie malnutrition: Secondary | ICD-10-CM | POA: Insufficient documentation

## 2022-07-10 DIAGNOSIS — J9621 Acute and chronic respiratory failure with hypoxia: Secondary | ICD-10-CM | POA: Diagnosis present

## 2022-07-10 DIAGNOSIS — E7800 Pure hypercholesterolemia, unspecified: Secondary | ICD-10-CM | POA: Diagnosis present

## 2022-07-10 DIAGNOSIS — F14129 Cocaine abuse with intoxication, unspecified: Secondary | ICD-10-CM

## 2022-07-10 DIAGNOSIS — R918 Other nonspecific abnormal finding of lung field: Secondary | ICD-10-CM | POA: Diagnosis not present

## 2022-07-10 DIAGNOSIS — E872 Acidosis, unspecified: Secondary | ICD-10-CM | POA: Diagnosis not present

## 2022-07-10 DIAGNOSIS — Z96653 Presence of artificial knee joint, bilateral: Secondary | ICD-10-CM | POA: Diagnosis present

## 2022-07-10 DIAGNOSIS — M1990 Unspecified osteoarthritis, unspecified site: Secondary | ICD-10-CM | POA: Diagnosis not present

## 2022-07-10 DIAGNOSIS — F1490 Cocaine use, unspecified, uncomplicated: Secondary | ICD-10-CM

## 2022-07-10 DIAGNOSIS — Z6829 Body mass index (BMI) 29.0-29.9, adult: Secondary | ICD-10-CM

## 2022-07-10 DIAGNOSIS — J9602 Acute respiratory failure with hypercapnia: Secondary | ICD-10-CM | POA: Diagnosis not present

## 2022-07-10 DIAGNOSIS — Z79899 Other long term (current) drug therapy: Secondary | ICD-10-CM

## 2022-07-10 DIAGNOSIS — Z7982 Long term (current) use of aspirin: Secondary | ICD-10-CM | POA: Diagnosis not present

## 2022-07-10 DIAGNOSIS — R404 Transient alteration of awareness: Secondary | ICD-10-CM | POA: Diagnosis not present

## 2022-07-10 DIAGNOSIS — G928 Other toxic encephalopathy: Secondary | ICD-10-CM | POA: Diagnosis present

## 2022-07-10 DIAGNOSIS — I160 Hypertensive urgency: Secondary | ICD-10-CM

## 2022-07-10 DIAGNOSIS — I499 Cardiac arrhythmia, unspecified: Secondary | ICD-10-CM | POA: Diagnosis not present

## 2022-07-10 DIAGNOSIS — J441 Chronic obstructive pulmonary disease with (acute) exacerbation: Principal | ICD-10-CM | POA: Diagnosis present

## 2022-07-10 DIAGNOSIS — F17210 Nicotine dependence, cigarettes, uncomplicated: Secondary | ICD-10-CM | POA: Diagnosis present

## 2022-07-10 DIAGNOSIS — T380X5A Adverse effect of glucocorticoids and synthetic analogues, initial encounter: Secondary | ICD-10-CM | POA: Diagnosis not present

## 2022-07-10 DIAGNOSIS — Z7952 Long term (current) use of systemic steroids: Secondary | ICD-10-CM

## 2022-07-10 DIAGNOSIS — Z4682 Encounter for fitting and adjustment of non-vascular catheter: Secondary | ICD-10-CM | POA: Diagnosis not present

## 2022-07-10 DIAGNOSIS — I890 Lymphedema, not elsewhere classified: Secondary | ICD-10-CM | POA: Diagnosis not present

## 2022-07-10 DIAGNOSIS — Z91148 Patient's other noncompliance with medication regimen for other reason: Secondary | ICD-10-CM

## 2022-07-10 DIAGNOSIS — J9622 Acute and chronic respiratory failure with hypercapnia: Secondary | ICD-10-CM | POA: Diagnosis present

## 2022-07-10 DIAGNOSIS — J9690 Respiratory failure, unspecified, unspecified whether with hypoxia or hypercapnia: Secondary | ICD-10-CM | POA: Diagnosis not present

## 2022-07-10 DIAGNOSIS — I130 Hypertensive heart and chronic kidney disease with heart failure and stage 1 through stage 4 chronic kidney disease, or unspecified chronic kidney disease: Secondary | ICD-10-CM | POA: Diagnosis present

## 2022-07-10 DIAGNOSIS — I161 Hypertensive emergency: Secondary | ICD-10-CM | POA: Diagnosis present

## 2022-07-10 DIAGNOSIS — J80 Acute respiratory distress syndrome: Secondary | ICD-10-CM | POA: Diagnosis not present

## 2022-07-10 DIAGNOSIS — R0902 Hypoxemia: Secondary | ICD-10-CM | POA: Diagnosis not present

## 2022-07-10 LAB — TYPE AND SCREEN
ABO/RH(D): O POS
Antibody Screen: NEGATIVE

## 2022-07-10 LAB — ECHOCARDIOGRAM COMPLETE
AR max vel: 1.92 cm2
AV Area VTI: 2.1 cm2
AV Area mean vel: 1.78 cm2
AV Mean grad: 5 mmHg
AV Peak grad: 9.4 mmHg
Ao pk vel: 1.53 m/s
Area-P 1/2: 3.91 cm2
Calc EF: 44.5 %
Height: 69 in
S' Lateral: 3.7 cm
Single Plane A2C EF: 43.3 %
Single Plane A4C EF: 45.6 %
Weight: 3202.84 oz

## 2022-07-10 LAB — POCT I-STAT 7, (LYTES, BLD GAS, ICA,H+H)
Acid-base deficit: 4 mmol/L — ABNORMAL HIGH (ref 0.0–2.0)
Acid-base deficit: 5 mmol/L — ABNORMAL HIGH (ref 0.0–2.0)
Bicarbonate: 23.2 mmol/L (ref 20.0–28.0)
Bicarbonate: 22.4 mmol/L (ref 20.0–28.0)
Calcium, Ion: 1.22 mmol/L (ref 1.15–1.40)
Calcium, Ion: 1.23 mmol/L (ref 1.15–1.40)
HCT: 30 % — ABNORMAL LOW (ref 39.0–52.0)
HCT: 30 % — ABNORMAL LOW (ref 39.0–52.0)
Hemoglobin: 10.2 g/dL — ABNORMAL LOW (ref 13.0–17.0)
Hemoglobin: 10.2 g/dL — ABNORMAL LOW (ref 13.0–17.0)
O2 Saturation: 96 %
O2 Saturation: 98 %
Patient temperature: 35.8
Patient temperature: 35.1
Potassium: 3.9 mmol/L (ref 3.5–5.1)
Potassium: 4.2 mmol/L (ref 3.5–5.1)
Sodium: 142 mmol/L (ref 135–145)
Sodium: 140 mmol/L (ref 135–145)
TCO2: 25 mmol/L (ref 22–32)
TCO2: 24 mmol/L (ref 22–32)
pCO2 arterial: 51.6 mmHg — ABNORMAL HIGH (ref 32–48)
pCO2 arterial: 47 mmHg (ref 32–48)
pH, Arterial: 7.255 — ABNORMAL LOW (ref 7.35–7.45)
pH, Arterial: 7.277 — ABNORMAL LOW (ref 7.35–7.45)
pO2, Arterial: 92 mmHg (ref 83–108)
pO2, Arterial: 107 mmHg (ref 83–108)

## 2022-07-10 LAB — RAPID URINE DRUG SCREEN, HOSP PERFORMED
Amphetamines: NOT DETECTED
Barbiturates: NOT DETECTED
Benzodiazepines: NOT DETECTED
Cocaine: POSITIVE — AB
Opiates: NOT DETECTED
Tetrahydrocannabinol: NOT DETECTED

## 2022-07-10 LAB — TROPONIN I (HIGH SENSITIVITY)
Troponin I (High Sensitivity): 64 ng/L — ABNORMAL HIGH (ref <18)
Troponin I (High Sensitivity): 70 ng/L — ABNORMAL HIGH (ref <18)

## 2022-07-10 LAB — URINALYSIS, ROUTINE W REFLEX MICROSCOPIC
Bilirubin Urine: NEGATIVE
Glucose, UA: NEGATIVE mg/dL
Ketones, ur: NEGATIVE mg/dL
Leukocytes,Ua: NEGATIVE
Nitrite: NEGATIVE
Protein, ur: >=300 mg/dL — AB
Specific Gravity, Urine: 1.029 (ref 1.005–1.030)
pH: 5 (ref 5.0–8.0)

## 2022-07-10 LAB — LACTIC ACID, PLASMA
Lactic Acid, Venous: 3.6 mmol/L (ref 0.5–1.9)
Lactic Acid, Venous: 2.4 mmol/L (ref 0.5–1.9)
Lactic Acid, Venous: 2.5 mmol/L (ref 0.5–1.9)

## 2022-07-10 LAB — I-STAT ARTERIAL BLOOD GAS, ED
Acid-base deficit: 10 mmol/L — ABNORMAL HIGH (ref 0.0–2.0)
Acid-base deficit: 5 mmol/L — ABNORMAL HIGH (ref 0.0–2.0)
Bicarbonate: 22.8 mmol/L (ref 20.0–28.0)
Bicarbonate: 24.4 mmol/L (ref 20.0–28.0)
Calcium, Ion: 1.24 mmol/L (ref 1.15–1.40)
Calcium, Ion: 1.26 mmol/L (ref 1.15–1.40)
HCT: 36 % — ABNORMAL LOW (ref 39.0–52.0)
HCT: 34 % — ABNORMAL LOW (ref 39.0–52.0)
Hemoglobin: 12.2 g/dL — ABNORMAL LOW (ref 13.0–17.0)
Hemoglobin: 11.6 g/dL — ABNORMAL LOW (ref 13.0–17.0)
O2 Saturation: 100 %
O2 Saturation: 86 %
Patient temperature: 97.6
Potassium: 4.3 mmol/L (ref 3.5–5.1)
Potassium: 4.2 mmol/L (ref 3.5–5.1)
Sodium: 140 mmol/L (ref 135–145)
Sodium: 141 mmol/L (ref 135–145)
TCO2: 26 mmol/L (ref 22–32)
TCO2: 26 mmol/L (ref 22–32)
pCO2 arterial: 94.4 mmHg (ref 32–48)
pCO2 arterial: 65.6 mmHg (ref 32–48)
pH, Arterial: 6.992 — CL (ref 7.35–7.45)
pH, Arterial: 7.176 — CL (ref 7.35–7.45)
pO2, Arterial: 324 mmHg — ABNORMAL HIGH (ref 83–108)
pO2, Arterial: 64 mmHg — ABNORMAL LOW (ref 83–108)

## 2022-07-10 LAB — COMPREHENSIVE METABOLIC PANEL
ALT: 58 U/L — ABNORMAL HIGH (ref 0–44)
AST: 68 U/L — ABNORMAL HIGH (ref 15–41)
Albumin: 3.8 g/dL (ref 3.5–5.0)
Alkaline Phosphatase: 97 U/L (ref 38–126)
Anion gap: 10 (ref 5–15)
BUN: 25 mg/dL — ABNORMAL HIGH (ref 8–23)
CO2: 21 mmol/L — ABNORMAL LOW (ref 22–32)
Calcium: 8.8 mg/dL — ABNORMAL LOW (ref 8.9–10.3)
Chloride: 110 mmol/L (ref 98–111)
Creatinine, Ser: 2.03 mg/dL — ABNORMAL HIGH (ref 0.61–1.24)
GFR, Estimated: 35 mL/min — ABNORMAL LOW (ref >60)
Glucose, Bld: 206 mg/dL — ABNORMAL HIGH (ref 70–99)
Potassium: 4.3 mmol/L (ref 3.5–5.1)
Sodium: 141 mmol/L (ref 135–145)
Total Bilirubin: 0.2 mg/dL — ABNORMAL LOW (ref 0.3–1.2)
Total Protein: 6.8 g/dL (ref 6.5–8.1)

## 2022-07-10 LAB — GLUCOSE, CAPILLARY
Glucose-Capillary: 118 mg/dL — ABNORMAL HIGH (ref 70–99)
Glucose-Capillary: 119 mg/dL — ABNORMAL HIGH (ref 70–99)
Glucose-Capillary: 124 mg/dL — ABNORMAL HIGH (ref 70–99)
Glucose-Capillary: 179 mg/dL — ABNORMAL HIGH (ref 70–99)
Glucose-Capillary: 180 mg/dL — ABNORMAL HIGH (ref 70–99)
Glucose-Capillary: 183 mg/dL — ABNORMAL HIGH (ref 70–99)

## 2022-07-10 LAB — PROTIME-INR
INR: 1.1 (ref 0.8–1.2)
Prothrombin Time: 13.9 seconds (ref 11.4–15.2)

## 2022-07-10 LAB — ABO/RH: ABO/RH(D): O POS

## 2022-07-10 LAB — PROCALCITONIN: Procalcitonin: 0.12 ng/mL

## 2022-07-10 LAB — CBC
HCT: 39.5 % (ref 39.0–52.0)
Hemoglobin: 12.2 g/dL — ABNORMAL LOW (ref 13.0–17.0)
MCH: 28.1 pg (ref 26.0–34.0)
MCHC: 30.9 g/dL (ref 30.0–36.0)
MCV: 91 fL (ref 80.0–100.0)
Platelets: 166 10*3/uL (ref 150–400)
RBC: 4.34 MIL/uL (ref 4.22–5.81)
RDW: 13.2 % (ref 11.5–15.5)
WBC: 6.7 10*3/uL (ref 4.0–10.5)
nRBC: 0 % (ref 0.0–0.2)

## 2022-07-10 LAB — HIV ANTIBODY (ROUTINE TESTING W REFLEX): HIV Screen 4th Generation wRfx: NONREACTIVE

## 2022-07-10 LAB — MRSA NEXT GEN BY PCR, NASAL: MRSA by PCR Next Gen: NOT DETECTED

## 2022-07-10 LAB — PHOSPHORUS: Phosphorus: 6.7 mg/dL — ABNORMAL HIGH (ref 2.5–4.6)

## 2022-07-10 LAB — TRIGLYCERIDES: Triglycerides: 113 mg/dL (ref <150)

## 2022-07-10 LAB — MAGNESIUM: Magnesium: 2.1 mg/dL (ref 1.7–2.4)

## 2022-07-10 LAB — APTT: aPTT: 37 seconds — ABNORMAL HIGH (ref 24–36)

## 2022-07-10 LAB — BRAIN NATRIURETIC PEPTIDE: B Natriuretic Peptide: 778.6 pg/mL — ABNORMAL HIGH (ref 0.0–100.0)

## 2022-07-10 MED ORDER — METHYLPREDNISOLONE SODIUM SUCC 125 MG IJ SOLR
125.0000 mg | Freq: Once | INTRAMUSCULAR | Status: AC
  Administered 2022-07-10: 125 mg via INTRAVENOUS
  Filled 2022-07-10: qty 2

## 2022-07-10 MED ORDER — VANCOMYCIN HCL 1500 MG/300ML IV SOLN
1500.0000 mg | Freq: Once | INTRAVENOUS | Status: DC
  Filled 2022-07-10: qty 300

## 2022-07-10 MED ORDER — PROPOFOL 1000 MG/100ML IV EMUL
0.0000 ug/kg/min | INTRAVENOUS | Status: DC
  Administered 2022-07-10: 30 ug/kg/min via INTRAVENOUS
  Administered 2022-07-10 (×2): 25 ug/kg/min via INTRAVENOUS
  Administered 2022-07-10 – 2022-07-11 (×2): 20 ug/kg/min via INTRAVENOUS
  Filled 2022-07-10 (×4): qty 100

## 2022-07-10 MED ORDER — ACETAMINOPHEN 325 MG PO TABS: 650.0000 mg | ORAL_TABLET | Freq: Four times a day (QID) | ORAL | Status: DC | PRN

## 2022-07-10 MED ORDER — HYDRALAZINE HCL 25 MG PO TABS: 25.0000 mg | ORAL_TABLET | Freq: Three times a day (TID) | ORAL | Status: DC

## 2022-07-10 MED ORDER — ARFORMOTEROL TARTRATE 15 MCG/2ML IN NEBU
15.0000 ug | INHALATION_SOLUTION | Freq: Two times a day (BID) | RESPIRATORY_TRACT | Status: DC
  Administered 2022-07-10 – 2022-07-14 (×10): 15 ug via RESPIRATORY_TRACT
  Filled 2022-07-10 (×11): qty 2

## 2022-07-10 MED ORDER — ORAL CARE MOUTH RINSE: 15.0000 mL | OROMUCOSAL | Status: DC | PRN

## 2022-07-10 MED ORDER — ORAL CARE MOUTH RINSE: 15.0000 mL | OROMUCOSAL | Status: DC

## 2022-07-10 MED ORDER — PANTOPRAZOLE 2 MG/ML SUSPENSION: 40.0000 mg | Freq: Every day | ORAL | Status: DC

## 2022-07-10 MED ORDER — OSMOLITE 1.5 CAL PO LIQD
1000.0000 mL | ORAL | Status: DC
  Administered 2022-07-10: 1000 mL

## 2022-07-10 MED ORDER — POLYETHYLENE GLYCOL 3350 17 G PO PACK
17.0000 g | PACK | Freq: Every day | ORAL | Status: DC
  Administered 2022-07-10 – 2022-07-11 (×2): 17 g
  Filled 2022-07-10 (×2): qty 1

## 2022-07-10 MED ORDER — SODIUM CHLORIDE 0.9 % IV SOLN
500.0000 mg | Freq: Every day | INTRAVENOUS | Status: DC
  Administered 2022-07-10: 500 mg via INTRAVENOUS
  Filled 2022-07-10: qty 5

## 2022-07-10 MED ORDER — PANTOPRAZOLE 2 MG/ML SUSPENSION
40.0000 mg | Freq: Every day | ORAL | Status: DC
  Administered 2022-07-10 – 2022-07-11 (×2): 40 mg
  Filled 2022-07-10 (×2): qty 20

## 2022-07-10 MED ORDER — VITAL HIGH PROTEIN PO LIQD
1000.0000 mL | ORAL | Status: AC
  Administered 2022-07-10: 1000 mL

## 2022-07-10 MED ORDER — PROPOFOL 1000 MG/100ML IV EMUL
0.0000 ug/kg/min | INTRAVENOUS | Status: DC
  Administered 2022-07-10: 15 ug/kg/min via INTRAVENOUS

## 2022-07-10 MED ORDER — PROPOFOL 1000 MG/100ML IV EMUL
INTRAVENOUS | Status: AC
  Administered 2022-07-10: 5 ug/kg/min via INTRAVENOUS
  Filled 2022-07-10: qty 100

## 2022-07-10 MED ORDER — NITROGLYCERIN IN D5W 200-5 MCG/ML-% IV SOLN
0.0000 ug/min | INTRAVENOUS | Status: DC
  Administered 2022-07-10: 50 ug/min via INTRAVENOUS
  Filled 2022-07-10: qty 250

## 2022-07-10 MED ORDER — CHLORHEXIDINE GLUCONATE CLOTH 2 % EX PADS: 6.0000 | MEDICATED_PAD | Freq: Every day | CUTANEOUS | Status: DC

## 2022-07-10 MED ORDER — BUDESONIDE 0.5 MG/2ML IN SUSP
0.5000 mg | Freq: Two times a day (BID) | RESPIRATORY_TRACT | Status: DC
  Administered 2022-07-10 – 2022-07-14 (×10): 0.5 mg via RESPIRATORY_TRACT
  Filled 2022-07-10 (×11): qty 2

## 2022-07-10 MED ORDER — MAGNESIUM SULFATE 2 GM/50ML IV SOLN
2.0000 g | Freq: Once | INTRAVENOUS | Status: AC
  Administered 2022-07-10: 2 g via INTRAVENOUS
  Filled 2022-07-10: qty 50

## 2022-07-10 MED ORDER — LABETALOL HCL 5 MG/ML IV SOLN: 10.0000 mg | INTRAVENOUS | Status: DC | PRN

## 2022-07-10 MED ORDER — ALBUTEROL SULFATE (2.5 MG/3ML) 0.083% IN NEBU
10.0000 mg/h | INHALATION_SOLUTION | RESPIRATORY_TRACT | Status: DC
  Administered 2022-07-10: 10 mg/h via RESPIRATORY_TRACT
  Filled 2022-07-10: qty 12

## 2022-07-10 MED ORDER — CHLORHEXIDINE GLUCONATE CLOTH 2 % EX PADS
6.0000 | MEDICATED_PAD | Freq: Every day | CUTANEOUS | Status: DC
  Administered 2022-07-10 – 2022-07-12 (×4): 6 via TOPICAL

## 2022-07-10 MED ORDER — ASPIRIN 81 MG PO CHEW
81.0000 mg | CHEWABLE_TABLET | Freq: Every day | ORAL | Status: DC
  Administered 2022-07-10 – 2022-07-11 (×2): 81 mg
  Filled 2022-07-10 (×2): qty 1

## 2022-07-10 MED ORDER — FENTANYL BOLUS VIA INFUSION
25.0000 ug | INTRAVENOUS | Status: DC | PRN
  Administered 2022-07-10: 50 ug via INTRAVENOUS
  Administered 2022-07-10: 100 ug via INTRAVENOUS

## 2022-07-10 MED ORDER — MIDAZOLAM HCL 2 MG/2ML IJ SOLN: 1.0000 mg | INTRAMUSCULAR | Status: DC | PRN

## 2022-07-10 MED ORDER — ALBUTEROL SULFATE (2.5 MG/3ML) 0.083% IN NEBU: 2.5000 mg | INHALATION_SOLUTION | RESPIRATORY_TRACT | Status: DC | PRN

## 2022-07-10 MED ORDER — INSULIN ASPART 100 UNIT/ML IJ SOLN
0.0000 [IU] | INTRAMUSCULAR | Status: DC
  Administered 2022-07-10 (×2): 4 [IU] via SUBCUTANEOUS
  Administered 2022-07-10: 3 [IU] via SUBCUTANEOUS
  Administered 2022-07-10 – 2022-07-11 (×2): 4 [IU] via SUBCUTANEOUS
  Administered 2022-07-11: 3 [IU] via SUBCUTANEOUS

## 2022-07-10 MED ORDER — SODIUM CHLORIDE 0.9% FLUSH
3.0000 mL | Freq: Two times a day (BID) | INTRAVENOUS | Status: DC
  Administered 2022-07-10 – 2022-07-15 (×14): 3 mL via INTRAVENOUS

## 2022-07-10 MED ORDER — PROSOURCE TF20 ENFIT COMPATIBL EN LIQD
60.0000 mL | Freq: Every day | ENTERAL | Status: DC
  Administered 2022-07-10 – 2022-07-11 (×2): 60 mL
  Filled 2022-07-10 (×2): qty 60

## 2022-07-10 MED ORDER — IPRATROPIUM BROMIDE 0.02 % IN SOLN
0.5000 mg | Freq: Once | RESPIRATORY_TRACT | Status: AC
  Administered 2022-07-10: 0.5 mg via RESPIRATORY_TRACT
  Filled 2022-07-10: qty 2.5

## 2022-07-10 MED ORDER — REVEFENACIN 175 MCG/3ML IN SOLN
175.0000 ug | Freq: Every day | RESPIRATORY_TRACT | Status: DC
  Administered 2022-07-10 – 2022-07-14 (×5): 175 ug via RESPIRATORY_TRACT
  Filled 2022-07-10 (×7): qty 3

## 2022-07-10 MED ORDER — METHYLPREDNISOLONE SODIUM SUCC 125 MG IJ SOLR
80.0000 mg | Freq: Every day | INTRAMUSCULAR | Status: DC
  Administered 2022-07-10 – 2022-07-11 (×2): 80 mg via INTRAVENOUS
  Filled 2022-07-10 (×2): qty 2

## 2022-07-10 MED ORDER — DOCUSATE SODIUM 50 MG/5ML PO LIQD
100.0000 mg | Freq: Two times a day (BID) | ORAL | Status: DC
  Administered 2022-07-10 – 2022-07-11 (×3): 100 mg
  Filled 2022-07-10 (×3): qty 10

## 2022-07-10 MED ORDER — PIPERACILLIN-TAZOBACTAM 3.375 G IVPB 30 MIN
3.3750 g | Freq: Once | INTRAVENOUS | Status: DC
  Filled 2022-07-10: qty 50

## 2022-07-10 MED ORDER — ETOMIDATE 2 MG/ML IV SOLN
INTRAVENOUS | Status: AC | PRN
  Administered 2022-07-10: 30 mg via INTRAVENOUS

## 2022-07-10 MED ORDER — SODIUM CHLORIDE 0.9 % IV SOLN
2.0000 g | Freq: Every day | INTRAVENOUS | Status: DC
  Administered 2022-07-10 (×2): 2 g via INTRAVENOUS
  Filled 2022-07-10 (×2): qty 20

## 2022-07-10 MED ORDER — SODIUM CHLORIDE 0.9 % IV SOLN: Freq: Once | INTRAVENOUS | Status: AC

## 2022-07-10 MED ORDER — METOPROLOL TARTRATE 50 MG PO TABS: 50.0000 mg | ORAL_TABLET | Freq: Two times a day (BID) | ORAL | Status: DC

## 2022-07-10 MED ORDER — PROPOFOL 1000 MG/100ML IV EMUL
0.0000 ug/kg/min | INTRAVENOUS | Status: DC
Start: 2022-07-10 — End: 2022-07-10

## 2022-07-10 MED ORDER — FUROSEMIDE 10 MG/ML IJ SOLN
40.0000 mg | Freq: Four times a day (QID) | INTRAMUSCULAR | Status: DC
  Administered 2022-07-10: 40 mg via INTRAVENOUS
  Filled 2022-07-10 (×2): qty 4

## 2022-07-10 MED ORDER — ATORVASTATIN CALCIUM 40 MG PO TABS
40.0000 mg | ORAL_TABLET | Freq: Every day | ORAL | Status: DC
  Administered 2022-07-10 – 2022-07-11 (×2): 40 mg
  Filled 2022-07-10 (×2): qty 1

## 2022-07-10 MED ORDER — SUCCINYLCHOLINE CHLORIDE 20 MG/ML IJ SOLN
INTRAMUSCULAR | Status: AC | PRN
  Administered 2022-07-10: 100 mg via INTRAVENOUS

## 2022-07-10 MED ORDER — ORAL CARE MOUTH RINSE
15.0000 mL | OROMUCOSAL | Status: DC
  Administered 2022-07-10 – 2022-07-11 (×11): 15 mL via OROMUCOSAL

## 2022-07-10 MED ORDER — VITAL HIGH PROTEIN PO LIQD
1000.0000 mL | ORAL | Status: DC
  Administered 2022-07-10: 1000 mL
  Filled 2022-07-10: qty 1000

## 2022-07-10 MED ORDER — VITAL HIGH PROTEIN PO LIQD: 1000.0000 mL | ORAL | Status: DC

## 2022-07-10 MED ORDER — ENOXAPARIN SODIUM 40 MG/0.4ML IJ SOSY
40.0000 mg | PREFILLED_SYRINGE | Freq: Every day | INTRAMUSCULAR | Status: DC
  Administered 2022-07-10 – 2022-07-15 (×6): 40 mg via SUBCUTANEOUS
  Filled 2022-07-10 (×6): qty 0.4

## 2022-07-10 MED ORDER — SODIUM CHLORIDE 0.9 % IV SOLN: INTRAVENOUS | Status: DC | PRN

## 2022-07-10 MED ORDER — CLONIDINE HCL 0.1 MG PO TABS: 0.1000 mg | ORAL_TABLET | Freq: Two times a day (BID) | ORAL | Status: DC

## 2022-07-10 MED ORDER — FENTANYL 2500MCG IN NS 250ML (10MCG/ML) PREMIX INFUSION
25.0000 ug/h | INTRAVENOUS | Status: DC
  Administered 2022-07-10: 50 ug/h via INTRAVENOUS
  Administered 2022-07-10: 125 ug/h via INTRAVENOUS
  Filled 2022-07-10 (×2): qty 250

## 2022-07-10 MED ORDER — AMLODIPINE BESYLATE 10 MG PO TABS: 10.0000 mg | ORAL_TABLET | Freq: Every day | ORAL | Status: DC

## 2022-07-10 NOTE — H&P (Signed)
NAME:  Phillip Kennedy, MRN:  [id number], DOB:  [birth year], LOS: 0 ADMISSION DATE:  07/10/2022, CONSULTATION DATE:  07/10/2022 REFERRING MD:  Dr. ED, CHIEF COMPLAINT:  Respiratory failure  History of Present Illness:  66 yo male smoker brought from home by EMS to [facility] ER with shortness of breath.  On arrival to ER he was in respiratory distress and noted to have diffuse wheezing.  Blood pressure was 237/166 and started on NTG gtt in the ER.  He became lethargic and required intubation.  ABG showed severe acute respiratory acidosis.  Started on ABx for pneumonia, and therapy for COPD exacerbation.  UDS positive for cocaine.  PCCM asked to admit to ICU.  Hx from chart and medical team.    Pertinent  Medical History  Combined CHF, COPD, CKD 3b, DM type 2, HLD, HTN, Arthritis, CAD, Lymphedema, VDRF March 2023  Significant Hospital Events: Including procedures, antibiotic start and stop dates in addition to other pertinent events   9/19 Admit  Interim History / Subjective:    Objective   Blood pressure (!) 174/107, pulse 82, temperature (!) 97.1 F (36.2 C), resp. rate 15, height 5\' 9"  (1.753 m), weight 95 kg, SpO2 90 %.    Vent Mode: PRVC FiO2 (%):  [60 %-100 %] 60 % Set Rate:  [26 bmp] 26 bmp Vt Set:  [560 mL] 560 mL PEEP:  [5 cmH20] 5 cmH20 Plateau Pressure:  [19 cmH20] 19 cmH20   Intake/Output Summary (Last 24 hours) at 07/10/2022 0304 Last data filed at 07/10/2022 0249 Gross per 24 hour  Intake 24.05 ml  Output --  Net 24.05 ml   Filed Weights   07/10/22 0117  Weight: 95 kg    Examination:  General - sedated Eyes - pupils reactive ENT - ETT in place Cardiac - regular rate/rhythm, no murmur Chest - b/l crackles Abdomen - soft, non tender, + bowel sounds Extremities - lymphedema Skin - no rashes Neuro - RASS -4   Resolved Hospital Problem list     Assessment & Plan:   Acute hypoxic/hypercapnic respiratory failure. - full vent support - goal SpO2 90 to 95% -  adjust RR and Vt to avoid PEEPi - f/u CXR, ABG  COPD exacerbation. - yupelri, brovana, pulmicort - prn albuterol - solumedrol 40 mg bid  Rt upper lobe community acquired pneumonia. - rocephin, zithromax - f/u blood/sputum culture, pneumococcal/legionella ag, HIV  Hypertensive urgency from cocaine. Acute on chronic combined CHF. Hx of CAD, HLD. - wean off NTG gtt to keep SBP < 170 with prn IV labetalol - norvasc 10 mg daily,  catapres 0.1 mg bid, hydralazine 25 mg q8h, lopressor 50 mg bid - lasix 40 mg IV q6h x 2 doses - continue ASA, lipitor - f/u Echo  DM type 2 poorly controlled with steroid induced hyperglycemia. - SSI - f/u HbA1C  Acute toxic-metabolic encephalopathy 2nd to cocaine and hypercapnia. - no acute findings on CT head from 9/19 - diprivan/fentanyl gtts with prn versed for RASS goal -1 to -2  Best Practice (right click and "Reselect all SmartList Selections" daily)   Diet/type: tubefeeds DVT prophylaxis: LMWH GI prophylaxis: PPI Lines: N/A Foley:  N/A Code Status:  full code Last date of multidisciplinary goals of care discussion [no family available at this time]  Labs   CBC: Recent Labs  Lab 07/10/22 0112 07/10/22 0122 07/10/22 0252  WBC  --  6.7  --   HGB 12.2* 12.2* 11.6*  HCT 36.0* 39.5 34.0*  MCV  --  91.0  --   PLT  --  166  --     Basic Metabolic Panel: Recent Labs  Lab 07/10/22 0112 07/10/22 0122 07/10/22 0252  NA 140 141 141  K 4.3 4.3 4.2  CL  --  110  --   CO2  --  21*  --   GLUCOSE  --  206*  --   BUN  --  25*  --   CREATININE  --  2.03*  --   CALCIUM  --  8.8*  --   MG  --  2.1  --   PHOS  --  6.7*  --    GFR: Estimated Creatinine Clearance: 40.7 mL/min (A) (by C-G formula based on SCr of 2.03 mg/dL (H)). Recent Labs  Lab 07/10/22 0122  WBC 6.7  LATICACIDVEN 3.6*    Liver Function Tests: Recent Labs  Lab 07/10/22 0122  AST 68*  ALT 58*  ALKPHOS 97  BILITOT 0.2*  PROT 6.8  ALBUMIN 3.8   No results for  input(s): "LIPASE", "AMYLASE" in the last 168 hours. No results for input(s): "AMMONIA" in the last 168 hours.  ABG    Component Value Date/Time   PHART 7.176 (LL) 07/10/2022 0252   PCO2ART 65.6 (HH) 07/10/2022 0252   PO2ART 64 (L) 07/10/2022 0252   HCO3 24.4 07/10/2022 0252   TCO2 26 07/10/2022 0252   ACIDBASEDEF 5.0 (H) 07/10/2022 0252   O2SAT 86 07/10/2022 0252     Coagulation Profile: Recent Labs  Lab 07/10/22 0122  INR 1.1    Cardiac Enzymes: No results for input(s): "CKTOTAL", "CKMB", "CKMBINDEX", "TROPONINI" in the last 168 hours.  HbA1C: Hgb A1c MFr Bld  Date/Time Value Ref Range Status  01/07/2022 02:25 PM 7.0 (H) 4.8 - 5.6 % Final    Comment:    (NOTE) Pre diabetes:          5.7%-6.4%  Diabetes:              >6.4%  Glycemic control for   <7.0% adults with diabetes   10/09/2021 06:04 AM 7.0 (H) 4.8 - 5.6 % Final    Comment:    (NOTE) Pre diabetes:          5.7%-6.4%  Diabetes:              >6.4%  Glycemic control for   <7.0% adults with diabetes     CBG: No results for input(s): "GLUCAP" in the last 168 hours.  Review of Systems:   Unable to obtain  Past Medical History:  He,  has a past medical history of Arthritis, CHF (congestive heart failure) (HCC), Chronic bronchitis (HCC), Chronic kidney disease, COPD (chronic obstructive pulmonary disease) (HCC), Diabetes mellitus without complication (HCC), High cholesterol, and Hypertension.   Surgical History:   Past Surgical History:  Procedure Laterality Date   CORONARY ANGIOGRAPHY N/A 06/28/2020   Procedure: CORONARY ANGIOGRAPHY (CATH LAB);  Surgeon: Orpah Cobb, MD;  Location: [facility] INVASIVE CV LAB;  Service: Cardiovascular;  Laterality: N/A;   JOINT REPLACEMENT     KNEE CARTILAGE SURGERY Bilateral    MULTIPLE TOOTH EXTRACTIONS     TOTAL KNEE ARTHROPLASTY Left 12/24/2017   TOTAL KNEE ARTHROPLASTY Left 12/24/2017   Procedure: LEFT TOTAL KNEE ARTHROPLASTY;  Surgeon: Sheral Apley, MD;   Location: [facility] OR;  Service: Orthopedics;  Laterality: Left;   TOTAL KNEE ARTHROPLASTY Right 05/30/2021   Procedure: RIGHT TOTAL KNEE ARTHROPLASTY;  Surgeon: Sheral Apley, MD;  Location: [facility] ORS;  Service: Orthopedics;  Laterality: Right;     Social History:   reports that he has been smoking cigarettes. He has a 12.25 pack-year smoking history. He has never used smokeless tobacco. He reports that he does not currently use drugs after having used the following drugs: Cocaine. He reports that he does not drink alcohol.   Family History:  His family history is not on file.   Allergies No Known Allergies   Home Medications  Prior to Admission medications   Medication Sig Start Date End Date Taking? Authorizing Provider  acetaminophen (TYLENOL) 500 MG tablet Take 1,000 mg by mouth every 6 (six) hours as needed for mild pain.    [provider]  acetaminophen (TYLENOL) 500 MG tablet Take 500-1,000 mg by mouth every 8 (eight) hours as needed for mild pain or headache.    [provider]  albuterol (VENTOLIN HFA) 108 (90 Base) MCG/ACT inhaler Inhale 2-4 puffs into the lungs See admin instructions. Inhale 2-4 puffs into the lungs every four to six hours as needed for shortness of breath or wheezing 09/30/21   [provider]  amLODipine (NORVASC) 5 MG tablet Take 1 tablet (5 mg total) by mouth daily. 03/31/20   Orpah Cobb, MD  amLODipine (NORVASC) 5 MG tablet Take 5 mg by mouth daily. 05/25/21   [provider]  amLODipine (NORVASC) 5 MG tablet Take 5 mg by mouth daily. 12/19/21   [provider]  aspirin 81 MG chewable tablet Chew 1 tablet (81 mg total) by mouth 2 (two) times daily. To prevent blood clots after surgery 05/31/21   Julien Girt, PA-C  aspirin EC 81 MG tablet Take 81 mg by mouth daily. Swallow whole.    [provider]  atorvastatin (LIPITOR) 40 MG tablet Take 40 mg by mouth every evening. 02/16/20   [provider]   atorvastatin (LIPITOR) 40 MG tablet Take 40 mg by mouth every evening. 05/25/21   [provider]  BAYER ASPIRIN EC LOW DOSE 81 MG EC tablet Take 81 mg by mouth daily. Swallow whole.    [provider]  Cholecalciferol (VITAMIN D3 PO) Take 1 capsule by mouth daily with breakfast.    [provider]  cloNIDine (CATAPRES) 0.2 MG tablet Take 0.2 mg by mouth at bedtime.    [provider]  cloNIDine (CATAPRES) 0.2 MG tablet Take 1 tablet (0.2 mg total) by mouth 3 (three) times daily. 10/12/21   Tyrone Nine, MD  cloNIDine (CATAPRES) 0.2 MG tablet Take 0.2 mg by mouth in the morning and at bedtime. 10/12/21   [provider]  Cyanocobalamin (B-12 PO) Take 1 tablet by mouth daily.    [provider]  Cyanocobalamin (VITAMIN B-12 PO) Take 1 tablet by mouth daily with breakfast.    [provider]  docusate sodium (COLACE) 100 MG capsule 1 po BID while on narcotics  STOOL SOFTNER 05/31/21   Shepperson, Kirstin, PA-C  docusate sodium (COLACE) 100 MG capsule Take 100 mg by mouth daily.    [provider]  docusate sodium (COLACE) 100 MG capsule Take 100 mg by mouth daily.    [provider]  doxycycline (VIBRAMYCIN) 100 MG capsule Take 1 capsule (100 mg total) by mouth 2 (two) times daily. 10/12/21   Tyrone Nine, MD  empagliflozin (JARDIANCE) 10 MG TABS tablet Take 1 tablet (10 mg total) by mouth daily. 10/12/21   Tyrone Nine, MD  hydrALAZINE (APRESOLINE) 100 MG  tablet Take 1 tablet (100 mg total) by mouth 3 (three) times daily. 10/12/21   Tyrone Nine, MD  hydrALAZINE (APRESOLINE) 50 MG tablet Take 1 tablet (50 mg total) by mouth 3 (three) times daily. 06/29/20   Lanae Boast, MD  hydrALAZINE (APRESOLINE) 50 MG tablet Take 50 mg by mouth 2 (two) times daily. 11/13/21   [provider]  hydrocortisone cream 1 % Apply 1 application topically daily as needed for itching.    [provider]  isosorbide mononitrate  (IMDUR) 120 MG 24 hr tablet Take 120 mg by mouth at bedtime.    [provider]  isosorbide mononitrate (IMDUR) 120 MG 24 hr tablet Take 1 tablet (120 mg total) by mouth daily. 10/12/21   Tyrone Nine, MD  isosorbide mononitrate (IMDUR) 120 MG 24 hr tablet Take 120 mg by mouth daily. 10/12/21   [provider]  linagliptin (TRADJENTA) 5 MG TABS tablet Take 1 tablet (5 mg total) by mouth daily. 02/07/21   Orpah Cobb, MD  Menthol, Topical Analgesic, (BIOFREEZE EX) Apply 1 application topically daily.    [provider]  metoprolol tartrate (LOPRESSOR) 25 MG tablet Take 1 tablet (25 mg total) by mouth 2 (two) times daily. 02/06/21   Orpah Cobb, MD  metoprolol tartrate (LOPRESSOR) 25 MG tablet Take 25 mg by mouth at bedtime.    [provider]  Omega-3 Fatty Acids (FISH OIL PO) Take 1 capsule by mouth daily.    [provider]  oxyCODONE (OXY IR/ROXICODONE) 5 MG immediate release tablet 1 tablet po q 4 hrs prn pain patient had total knee replacement on 05/31/21 05/31/21   Shepperson, Kirstin, PA-C  polyethylene glycol (MIRALAX / GLYCOLAX) 17 g packet 17grams in 6 oz of SOMETHING TO DRINGK twice a day until bowel movement.  LAXITIVE.  Restart if two days since last bowel movement 05/31/21   Shepperson, Kirstin, PA-C  predniSONE (DELTASONE) 20 MG tablet Take 2 tablets (40 mg total) by mouth daily before breakfast. 10/13/21   Tyrone Nine, MD  PROAIR HFA 108 [phone] Base) MCG/ACT inhaler Inhale 2 puffs into the lungs every 4 (four) hours as needed for shortness of breath. 12/22/20   Nyoka Cowden, MD  Pyridoxine HCl (VITAMIN B-6 PO) Take 1 tablet by mouth daily with breakfast.    [provider]  torsemide (DEMADEX) 20 MG tablet Take 20 mg by mouth See admin instructions. Take 20 mg by mouth on Monday and Thursday as needed for fluid 02/28/21   [provider]  torsemide (DEMADEX) 20 MG tablet Take 20 mg by mouth See admin instructions. 20mg  on  Monday's and Thursday's as needed for fluid    [provider]  VENTOLIN HFA 108 (90 Base) MCG/ACT inhaler Inhale 2 puffs into the lungs 4 (four) times daily as needed for wheezing or shortness of breath. 01/03/22   [provider]     Critical care time: 46 minutes  01/05/22, MD [facility] Pulmonary/Critical Care Pager - [phone] 07/10/2022, 3:22 AM

## 2022-07-10 NOTE — ED Notes (Signed)
Pt taken to CT with this RN and RT 

## 2022-07-10 NOTE — ED Notes (Signed)
Intensivist at bedside.

## 2022-07-10 NOTE — ED Notes (Signed)
Patient transported to and from CT without complications.  

## 2022-07-10 NOTE — ED Provider Notes (Signed)
[facility] EMERGENCY DEPARTMENT Provider Note  CSN: [id number] Arrival date & time: 07/10/22 0053  Chief Complaint(s) Respiratory Distress  HPI Phillip Kennedy is a 66 y.o. male with a past medical history listed below including COPD, heart failure who presents to the emergency department in respiratory distress.  EMS was called out for respiratory distress and noted that the patient was tachypneic with poor air movement.  He was also agitated and combative in route.  Also noted to be hypertensive with systolics in the 626R.  On arrival patient was somnolent and unresponsive.  The history is provided by the EMS personnel.    Past Medical History Past Medical History:  Diagnosis Date   Arthritis    "knees and back" (12/24/2017)   CHF (congestive heart failure) (HCC)    Chronic bronchitis (HCC)    Chronic kidney disease    COPD (chronic obstructive pulmonary disease) ([place])    Gold 0   Diabetes mellitus without complication ([place])    High cholesterol    Hypertension    Patient Active Problem List   Diagnosis Date Noted   CAP (community acquired pneumonia) 01/09/2022   AKI (acute kidney injury) ([place]) 01/09/2022   Stage 3b chronic kidney disease (CKD) ([place]) 01/09/2022   DM2 (diabetes mellitus, type 2) ([place]) 01/09/2022   Chronic acquired lymphedema 01/09/2022   Benign essential HTN 01/09/2022   Acute on chronic systolic CHF (congestive heart failure) ([place]) 01/09/2022   Respiratory failure ([place]) 01/07/2022   Acute respiratory failure with hypoxia ([place]) 10/09/2021   Hypertensive emergency    AKI (acute kidney injury) ([place])    Hyperglycemia    Pulmonary edema    Primary localized osteoarthritis of right knee 05/30/2021   Cellulitis of right lower leg 02/01/2021   COPD  GOLD 0  / still smoking 08/30/2020   DOE (dyspnea on exertion) 08/30/2020   Cigarette smoker 08/30/2020   Acute pulmonary edema ([place]) 06/23/2020   Acute respiratory failure with hypoxia ([place])     Hypertensive emergency    Cellulitis of right leg 03/27/2020   Tobacco use disorder 12/26/2017   Primary osteoarthritis of knee 12/24/2017   Hypertension 11/26/2017   High cholesterol 11/26/2017   Primary osteoarthritis of left knee 11/26/2017   Osteoarthritis of right knee 09/16/2017   Home Medication(s) Prior to Admission medications   Medication Sig Start Date End Date Taking? Authorizing Provider  acetaminophen (TYLENOL) 500 MG tablet Take 1,000 mg by mouth every 6 (six) hours as needed for mild pain.    [provider]  acetaminophen (TYLENOL) 500 MG tablet Take 500-1,000 mg by mouth every 8 (eight) hours as needed for mild pain or headache.    [provider]  albuterol (VENTOLIN HFA) 108 (90 Base) MCG/ACT inhaler Inhale 2-4 puffs into the lungs See admin instructions. Inhale 2-4 puffs into the lungs every four to six hours as needed for shortness of breath or wheezing 09/30/21   [provider]  amLODipine (NORVASC) 5 MG tablet Take 1 tablet (5 mg total) by mouth daily. 03/31/20   [name], MD  amLODipine (NORVASC) 5 MG tablet Take 5 mg by mouth daily. 05/25/21   [provider]  amLODipine (NORVASC) 5 MG tablet Take 5 mg by mouth daily. 12/19/21   [provider]  aspirin 81 MG chewable tablet Chew 1 tablet (81 mg total) by mouth 2 (two) times daily. To prevent blood clots after surgery 05/31/21   Matthew Saras, PA-C  aspirin EC 81 MG  tablet Take 81 mg by mouth daily. Swallow whole.    [provider]  atorvastatin (LIPITOR) 40 MG tablet Take 40 mg by mouth every evening. 02/16/20   [provider]  atorvastatin (LIPITOR) 40 MG tablet Take 40 mg by mouth every evening. 05/25/21   [provider]  BAYER ASPIRIN EC LOW DOSE 81 MG EC tablet Take 81 mg by mouth daily. Swallow whole.    [provider]  Cholecalciferol (VITAMIN D3 PO) Take 1 capsule by mouth daily with breakfast.    [provider]  cloNIDine (CATAPRES) 0.2 MG tablet Take 0.2 mg by mouth at bedtime.    [provider]  cloNIDine (CATAPRES) 0.2 MG tablet Take 1 tablet (0.2 mg total) by mouth 3 (three) times daily. 10/12/21   Patrecia Pour, MD  cloNIDine (CATAPRES) 0.2 MG tablet Take 0.2 mg by mouth in the morning and at bedtime. 10/12/21   [provider]  Cyanocobalamin (B-12 PO) Take 1 tablet by mouth daily.    [provider]  Cyanocobalamin (VITAMIN B-12 PO) Take 1 tablet by mouth daily with breakfast.    [provider]  docusate sodium (COLACE) 100 MG capsule 1 po BID while on narcotics  STOOL SOFTNER 05/31/21   Shepperson, Kirstin, PA-C  docusate sodium (COLACE) 100 MG capsule Take 100 mg by mouth daily.    [provider]  docusate sodium (COLACE) 100 MG capsule Take 100 mg by mouth daily.    [provider]  doxycycline (VIBRAMYCIN) 100 MG capsule Take 1 capsule (100 mg total) by mouth 2 (two) times daily. 10/12/21   Patrecia Pour, MD  empagliflozin (JARDIANCE) 10 MG TABS tablet Take 1 tablet (10 mg total) by mouth daily. 10/12/21   Patrecia Pour, MD  hydrALAZINE (APRESOLINE) 100 MG tablet Take 1 tablet (100 mg total) by mouth 3 (three) times daily. 10/12/21   Patrecia Pour, MD  hydrALAZINE (APRESOLINE) 50 MG tablet Take 1 tablet (50 mg total) by mouth 3 (three) times daily. 06/29/20   Antonieta Pert, MD  hydrALAZINE (APRESOLINE) 50 MG tablet Take 50 mg by mouth 2 (two) times daily. 11/13/21   [provider]  hydrocortisone cream 1 % Apply 1 application topically daily as needed for itching.    [provider]  isosorbide mononitrate (IMDUR) 120 MG 24 hr tablet Take 120 mg by mouth at bedtime.    [provider]  isosorbide mononitrate (IMDUR) 120 MG 24 hr tablet Take 1 tablet (120 mg total) by mouth daily. 10/12/21   Patrecia Pour, MD  isosorbide mononitrate (IMDUR) 120 MG 24 hr tablet Take 120 mg by mouth daily. 10/12/21   [provider]  linagliptin (TRADJENTA) 5 MG TABS tablet Take 1 tablet (5 mg total) by mouth daily. 02/07/21   [name], MD  Menthol, Topical Analgesic, (BIOFREEZE EX) Apply 1 application topically daily.    [provider]  metoprolol tartrate (LOPRESSOR) 25 MG tablet Take 1 tablet (25 mg total) by mouth 2 (two) times daily. 02/06/21   [name], MD  metoprolol tartrate (LOPRESSOR) 25 MG tablet Take 25 mg by mouth at bedtime.    [provider]  Omega-3 Fatty Acids (FISH OIL PO) Take 1 capsule by mouth daily.    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  LAXITIVE.  Restart if two days since last bowel movement 05/31/21   Shepperson, Kirstin, PA-C  predniSONE (DELTASONE) 20 MG tablet Take 2 tablets (40 mg total) by mouth daily before breakfast. 10/13/21   Patrecia Pour, MD  PROAIR HFA 108 [phone] Base) MCG/ACT inhaler Inhale 2 puffs into the lungs every 4 (four) hours as needed for shortness of breath. 12/22/20   Tanda Rockers, MD  Pyridoxine HCl (VITAMIN B-6 PO) Take 1 tablet by mouth daily with breakfast.    [provider]  torsemide (DEMADEX) 20 MG tablet Take 20 mg by mouth See admin instructions. Take 20 mg by mouth on Monday and Thursday as needed for fluid 02/28/21   [provider]  torsemide (DEMADEX) 20 MG tablet Take 20 mg by mouth See admin instructions. 20mg  on Monday's and Thursday's as needed for fluid    [provider]  VENTOLIN HFA 108 (90 Base) MCG/ACT inhaler Inhale 2 puffs into the lungs 4 (four) times daily as needed for wheezing or shortness of breath. 01/03/22   [provider]                                                                                                                                     Allergies Patient has no known allergies.  Review of Systems Review of Systems As noted in HPI  Physical Exam Vital Signs  I have reviewed the triage vital signs BP (!) 223/159   Pulse 85   Resp 12   Ht 5\' 9"  (1.753 m)   Wt 95 kg   SpO2 100%   BMI 30.93 kg/m   Physical Exam Vitals reviewed.  Constitutional:      General: He is in acute distress.     Appearance: He is well-developed. He is toxic-appearing and diaphoretic.     Interventions: Face mask in place.  HENT:     Head: Normocephalic and atraumatic.     Nose: Nose normal.  Eyes:     General: No scleral icterus.       Right eye: No discharge.        Left eye: No discharge.     Conjunctiva/sclera: Conjunctivae normal.     Pupils: Pupils are equal, round, and reactive to light.     Comments: Pupils 48mm bilaterally  Cardiovascular:     Rate and Rhythm: Normal rate and regular rhythm.     Heart sounds: No murmur heard.    No friction rub. No gallop.  Pulmonary:     Effort: Tachypnea, accessory muscle usage, prolonged expiration, respiratory distress and retractions present.     Breath sounds: Decreased air movement present. Wheezing present.  Abdominal:     General: There is no distension.     Palpations: Abdomen is soft.  Tenderness: There is no abdominal tenderness.  Musculoskeletal:        General: No tenderness.     Cervical back: Normal range of motion and neck supple.     Right lower leg: 1+ Pitting Edema present.     Left lower leg: 1+ Pitting Edema present.  Skin:    General: Skin is warm.     Findings: No erythema or rash.  Neurological:     Mental Status: He is unresponsive.     ED Results and Treatments Labs (all labs ordered are listed, but only abnormal results are displayed) Labs Reviewed  I-STAT ARTERIAL BLOOD GAS, ED - Abnormal; Notable for the following components:      Result Value   pH, Arterial 6.992 (*)    pCO2  arterial 94.4 (*)    pO2, Arterial 324 (*)    Acid-base deficit 10.0 (*)    HCT 36.0 (*)    Hemoglobin 12.2 (*)    All other components within normal limits  CULTURE, BLOOD (ROUTINE X 2)  CULTURE, BLOOD (ROUTINE X 2)  URINE CULTURE  CULTURE, RESPIRATORY W GRAM STAIN  BLOOD GAS, ARTERIAL  COMPREHENSIVE METABOLIC PANEL  LACTIC ACID, PLASMA  LACTIC ACID, PLASMA  LACTIC ACID, PLASMA  LACTIC ACID, PLASMA  CBC  APTT  PROTIME-INR  MAGNESIUM  PHOSPHORUS  RAPID URINE DRUG SCREEN, HOSP PERFORMED  PROCALCITONIN  TRIGLYCERIDES  URINALYSIS, ROUTINE W REFLEX MICROSCOPIC  BRAIN NATRIURETIC PEPTIDE  CBG MONITORING, ED  TYPE AND SCREEN  ABO/RH  TROPONIN I (HIGH SENSITIVITY)                                                                                                                         EKG  EKG Interpretation  Date/Time:    Ventricular Rate:    PR Interval:    QRS Duration:   QT Interval:    QTC Calculation:   R Axis:     Text Interpretation:         Radiology DG Abd Portable 1V  Result Date: 07/10/2022 CLINICAL DATA:  Check gastric catheter placement EXAM: PORTABLE ABDOMEN - 1 VIEW COMPARISON:  None Available. FINDINGS: Gastric catheter is noted within the stomach. No free air is seen. Scattered colonic gas is noted. IMPRESSION: Gastric catheter within the stomach. Electronically Signed   By: Inez Catalina M.D.   On: 07/10/2022 01:36   DG Chest Portable 1 View  Result Date: 07/10/2022 CLINICAL DATA:  Check endotracheal tube placement EXAM: PORTABLE CHEST 1 VIEW COMPARISON:  01/07/2022 FINDINGS: Cardiac shadow is stable. Endotracheal tube and gastric catheter are noted in satisfactory position. Lungs are well aerated bilaterally. Patchy airspace opacity is again noted in the right upper lobe slightly more prominent than that seen on the prior exam. Left lung is clear. IMPRESSION: Tubes and lines as described above. Increasing right upper lobe infiltrate. Electronically Signed    By: Inez Catalina M.D.   On: 07/10/2022 01:35    Medications Ordered in  ED Medications  propofol (DIPRIVAN) 1000 MG/100ML infusion (5 mcg/kg/min Intravenous New Bag/Given 07/10/22 0127)  0.9 %  sodium chloride infusion (has no administration in time range)  albuterol (PROVENTIL) (2.5 MG/3ML) 0.083% nebulizer solution (10 mg/hr Nebulization New Bag/Given 07/10/22 0149)  nitroGLYCERIN 50 mg in dextrose 5 % 250 mL (0.2 mg/mL) infusion (50 mcg/min Intravenous New Bag/Given 07/10/22 0127)  ipratropium (ATROVENT) nebulizer solution 0.5 mg (0.5 mg Nebulization Given 07/10/22 0149)                                                                                                                                     Procedures Procedure Name: Intubation Date/Time: 07/10/2022 7:26 AM  Performed by: Fatima Blank, MDPre-anesthesia Checklist: Patient identified, Patient being monitored, Emergency Drugs available, Timeout performed and Suction available Oxygen Delivery Method: Non-rebreather mask Preoxygenation: Pre-oxygenation with 100% oxygen Induction Type: Rapid sequence Ventilation: Mask ventilation without difficulty Laryngoscope Size: Glidescope Grade View: Grade I Tube size: 7.5 mm Number of attempts: 1 Airway Equipment and Method: Rigid stylet Placement Confirmation: ETT inserted through vocal cords under direct vision, CO2 detector and Breath sounds checked- equal and bilateral Secured at: 26 cm Tube secured with: ETT holder    .Critical Care  Performed by: Fatima Blank, MD Authorized by: Fatima Blank, MD   Critical care provider statement:    Critical care time (minutes):  45   Critical care time was exclusive of:  Separately billable procedures and treating other patients   Critical care was necessary to treat or prevent imminent or life-threatening deterioration of the following conditions:  Respiratory failure, circulatory failure and CNS failure or  compromise   Critical care was time spent personally by me on the following activities:  Development of treatment plan with patient or surrogate, discussions with consultants, evaluation of patient's response to treatment, examination of patient, obtaining history from patient or surrogate, review of old charts, re-evaluation of patient's condition, pulse oximetry, ordering and review of radiographic studies, ordering and review of laboratory studies and ordering and performing treatments and interventions   Care discussed with: admitting provider     (including critical care time)  Medical Decision Making / ED Course   Medical Decision Making Amount and/or Complexity of Data Reviewed Independent Historian: EMS Labs: ordered. Decision-making details documented in ED Course. Radiology: ordered and independent interpretation performed. Decision-making details documented in ED Course. ECG/medicine tests: ordered and independent interpretation performed. Decision-making details documented in ED Course.  Risk Prescription drug management. Drug therapy requiring intensive monitoring for toxicity. Decision regarding hospitalization.    Respiratory distress, concerning for COPD/CHF exacerbation. Will assess for evidence of pneumonia, pneumothorax.  Patient required immediate intubation due to unresponsiveness. VBG consistent with respiratory acidosis from COPD exacerbation.  Patient noted to be hypertensive with systolics in the [id number] and diastolics in the 0000000.  On review of past medical record, patient has had prior UDS is that were  cocaine positive. Started on nitroglycerin drip.  Chest x-ray with right upper lobe opacity concerning for loculated pulmonary edema versus pneumonia.  Started on empiric antibiotics.  CT head obtained to rule out ICH and negative.  Patient admitted to ICU for further work-up and management.      Final Clinical Impression(s) / ED Diagnoses Final  diagnoses:  Acute respiratory failure with hypoxia and hypercapnia ([place])  Hypertensive emergency           This chart was dictated using voice recognition software.  Despite best efforts to proofread,  errors can occur which can change the documentation meaning.    Fatima Blank, MD 07/10/22 0730

## 2022-07-10 NOTE — ED Notes (Signed)
Pt very altered and lethargic on arrival - decision to intubate by MD

## 2022-07-10 NOTE — Progress Notes (Signed)
eLink Physician-Brief Progress Note Patient Name: Phillip Kennedy DOB: 1956/08/29 MRN: [id number]   Date of Service  07/10/2022  HPI/Events of Note  66 yr old male with COPD admitted with acute hypercapneic and hypoxic resp failure requiring intubation secondary to COPD exacerbation and pneumonia.  eICU Interventions  Chart reviewed.     Intervention Category Evaluation Type: New Patient Evaluation  Mauri Brooklyn, P 07/10/2022, 4:36 AM

## 2022-07-10 NOTE — TOC Progression Note (Signed)
Transition of Care [facility]) - Initial/Assessment Note    Patient Details  Name: Phillip Kennedy MRN: [id number] Date of Birth: 1956-09-06  [facility] of [facility]) CM/SW Contact:    Milinda Antis, LCSWA Phone Number: 07/10/2022, 3:10 PM  Clinical Narrative:                 Transition of Care Department [facility]) has reviewed patient.  Patient is from home and admitted for Acute hypoxemic respiratory failure.  Patient also tested positive for Cocaine.  Patient is currently intubated.    We will continue to monitor patient advancement through interdisciplinary progression rounds.  TOC will assess patient once medically stable.  Lind Covert, MSW, LCSWA         Patient Goals and CMS Choice        Expected Discharge Plan and Services                                                Prior Living Arrangements/Services                       Activities of Daily Living      Permission Sought/Granted                  Emotional Assessment              Admission diagnosis:  Acute hypoxemic respiratory failure ([place]) [J96.01] Patient Active Problem List   Diagnosis Date Noted   Acute hypoxemic respiratory failure ([place]) 07/10/2022   CAP (community acquired pneumonia) 01/09/2022   AKI (acute kidney injury) ([place]) 01/09/2022   Stage 3b chronic kidney disease (CKD) ([place]) 01/09/2022   DM2 (diabetes mellitus, type 2) ([place]) 01/09/2022   Chronic acquired lymphedema 01/09/2022   Benign essential HTN 01/09/2022   Acute on chronic systolic CHF (congestive heart failure) ([place]) 01/09/2022   Respiratory failure ([place]) 01/07/2022   Acute respiratory failure with hypoxia ([place]) 10/09/2021   Hypertensive emergency    AKI (acute kidney injury) ([place])    Hyperglycemia    Pulmonary edema    Primary localized osteoarthritis of right knee 05/30/2021   Cellulitis of right lower leg 02/01/2021   COPD  GOLD 0  / still smoking 08/30/2020   DOE (dyspnea on exertion)  08/30/2020   Cigarette smoker 08/30/2020   Acute pulmonary edema ([place]) 06/23/2020   Acute respiratory failure with hypoxia ([place])    Hypertensive emergency    Cellulitis of right leg 03/27/2020   Tobacco use disorder 12/26/2017   Primary osteoarthritis of knee 12/24/2017   Hypertension 11/26/2017   High cholesterol 11/26/2017   Primary osteoarthritis of left knee 11/26/2017   Osteoarthritis of right knee 09/16/2017   PCP:  [name], MD Pharmacy:   [facility] [place], [place] [name] [address] Phone: [phone] Fax: [phone]  [facility] [address] [name] [address] Phone: [phone] Fax: [phone]     Social Determinants of Health (SDOH) Interventions    Readmission Risk Interventions     No data to display

## 2022-07-10 NOTE — ED Notes (Signed)
Per Larkin Ina RN - ready to receive pt

## 2022-07-10 NOTE — ED Notes (Signed)
30 Etomidate  100 roc given by [employer]

## 2022-07-10 NOTE — ED Triage Notes (Signed)
Pt BIB EMS from home. Pt has hx of COPD, called out for SOB. On EMS arrival pt was tripoding, began getting combative and altered. EMS reports severe wheezing in all fields. EMS gave 1alb neb and 125 solumedrol.  Ems reports pt systolic over 532 [id number]

## 2022-07-10 NOTE — Progress Notes (Signed)
Initial Nutrition Assessment  DOCUMENTATION CODES:   Non-severe (moderate) malnutrition in context of chronic illness  INTERVENTION:   Tube feeding via OG tube: D/C Vital High Protein   Osmolite 1.5 at 30 ml/h and increase by 10 ml every 8 hours to goal rate of 60 ml/hr (1440 ml per day) Prosource TF20 60 ml daily   Provides 2240 kcal, 110 gm protein, 1094 ml free water daily   NUTRITION DIAGNOSIS:   Moderate Malnutrition related to chronic illness (COPD) as evidenced by moderate fat depletion, moderate muscle depletion.  GOAL:   Patient will meet greater than or equal to 90% of their needs  MONITOR:   TF tolerance  REASON FOR ASSESSMENT:   Consult Enteral/tube feeding initiation and management  ASSESSMENT:   Pt with PMH of CHF, COPD, CKD stage 3, DM, HLD, HTN, arthritis, CAD, lymphedema, VDRF 12/2021 now admitted from home with COPD exacerbation, PNA, and positive for cocaine.   Spoke with MD, ok to advance TF  9/19 trickle TF started; Vital High Protein @ 20 ml/hr  Medications reviewed and include: colace, SSI, solu-medrol, protonix, miralax Fentanyl  Propofol @ 17 ml/hr provides: 448 kcal   Labs reviewed: PO4 6.7 CBG's: 118-180 A1C: 7.0 (12/2021)   18 F OG tube: tip within stomach  NUTRITION - FOCUSED PHYSICAL EXAM:  Flowsheet Row Most Recent Value  Orbital Region Moderate depletion  Upper Arm Region Moderate depletion  Thoracic and Lumbar Region Moderate depletion  Buccal Region Unable to assess  Temple Region Severe depletion  Clavicle Bone Region Severe depletion  Clavicle and Acromion Bone Region Mild depletion  Scapular Bone Region Unable to assess  Dorsal Hand Unable to assess  Patellar Region Moderate depletion  Anterior Thigh Region Moderate depletion  Posterior Calf Region Unable to assess  Edema (RD Assessment) Severe  [bilateral ankle edema]  Hair Reviewed  Eyes Unable to assess  Mouth Unable to assess  Skin Reviewed  Nails Unable to  assess       Diet Order:   Diet Order             Diet NPO time specified  Diet effective now                   EDUCATION NEEDS:   No education needs have been identified at this time  Skin:  Skin Assessment: Reviewed RN Assessment  Last BM:  unknown  Height:   Ht Readings from Last 1 Encounters:  07/10/22 5\' 9"  (1.753 m)    Weight:   Wt Readings from Last 1 Encounters:  07/10/22 90.8 kg    BMI:  Body mass index is 29.56 kg/m.  Estimated Nutritional Needs:   Kcal:  2200-2400  Protein:  110-125 grams  Fluid:  >2 L/day  Lockie Pares., RD, LDN, CNSC See AMiON for contact information

## 2022-07-10 NOTE — ED Notes (Signed)
MD notified of critical lactic  

## 2022-07-10 NOTE — Progress Notes (Addendum)
NAME:  Phillip Kennedy, MRN:  [id number], DOB:  Jun 06, 1956, LOS: 0 ADMISSION DATE:  07/10/2022, CONSULTATION DATE:  9/19 REFERRING MD:  ED, CHIEF COMPLAINT:  Respiratory failure   History of Present Illness:  66 yo male smoker brought from home by EMS to [facility] ER with shortness of breath.  On arrival to ER he was in respiratory distress and noted to have diffuse wheezing.  Blood pressure was 237/166 and started on NTG gtt in the ER.  He became lethargic and required intubation.  ABG showed severe acute respiratory acidosis.  Started on ABx for pneumonia, and therapy for COPD exacerbation.  UDS positive for cocaine.  PCCM asked to admit to ICU.   Hx from chart and medical team.    Pertinent  Medical History  Combined CHF, COPD, CKD 3b, DM type 2, HLD, HTN, Arthritis, CAD, Lymphedema, VDRF March 2023  Significant Hospital Events: Including procedures, antibiotic start and stop dates in addition to other pertinent events   9/19: Admitted to ICU. Intubated. CTX and Zithromax started.  Interim History / Subjective:  BP elevated on admission with MAP 166, now hypotensive with MAP 70s-80s.  Subjective: Unable to obtain Objective   Blood pressure (!) 90/57, pulse (!) 57, temperature (!) 95.5 F (35.3 C), temperature source Bladder, resp. rate (!) 30, height 5\' 9"  (1.753 m), weight 90.8 kg, SpO2 100 %.    Vent Mode: PRVC FiO2 (%):  [60 %-100 %] 60 % Set Rate:  [26 bmp-30 bmp] 30 bmp Vt Set:  [560 mL] 560 mL PEEP:  [5 cmH20-8 cmH20] 8 cmH20 Plateau Pressure:  [19 cmH20] 19 cmH20  FiO2 60%, PEEP 8, Resp rate 30, Tidal volume 560  Intake/Output Summary (Last 24 hours) at 07/10/2022 0811 Last data filed at 07/10/2022 0331 Gross per 24 hour  Intake 38.88 ml  Output --  Net 38.88 ml   Filed Weights   07/10/22 0117 07/10/22 0340  Weight: 95 kg 90.8 kg   Labs: ABG: ph 7.255, pCO2 51.6, pO2 92 (initial pH 6.992, pCO2 94.4, pO2 324 Procal 0.12 BNP 778.6 Lactic acid 3.6 CMP: Cr 2.03, BUN  25, AST 68/ALT 58  Examination: General: Sedated HENT: Pupils reactive Lungs: Good aeration, no crackles appreciated Cardiovascular: RRR Abdomen: Soft Extremities: 1+ pretibial edema BLLE Neuro: Arousable to tactile stimulus  Resolved Hospital Problem list     Assessment & Plan:  Acute hypoxic/hypercapnic respiratory failure  Improving acidosis w/ pH 6.9 > 7.17 > 7.255. - full vent support; decreased to FiO2 40%, turned down RR to 26 - goal SpO2 90 to 95% - ABG at 1000   COPD exacerbation. - yupelri, brovana, pulmicort - prn albuterol - continue solu-medrol 80 mg IV daily (s/p solumedrol 125 x1 and 80mg  x1)   Rt upper lobe community acquired pneumonia. Lactic acidosis Lactic acid 3.6. - Continue CTX and Azithromycin - f/u blood/sputum culture, pneumococcal/legionella ag,  - Repeat lactic acid   Hypertensive emergency from cocaine. Acute on chronic combined CHF. Hx of CAD, HLD. Now hypotensive 90s-100/60s with significant drop in MAP over 24 hours from 177 to 78. - D/c nitro gtt - Hold amlodipine and norvasc 10 mg daily,  catapres 0.1 mg bid, hydralazine 25 mg q8h, lopressor 50 mg bid - s/p lasix 40 mg IV x1 - continue ASA, lipitor - f/u Echo   DM type 2 poorly controlled with steroid induced hyperglycemia. - SSI - f/u HbA1C   Acute toxic-metabolic encephalopathy 2nd to cocaine and hypercapnia. - no acute findings  on CT head from 9/19 - diprivan/fentanyl gtts with prn versed   Best Practice (right click and "Reselect all SmartList Selections" daily)   Diet/type: tubefeeds DVT prophylaxis: LMWH GI prophylaxis: PPI Lines: N/A Foley:  N/A Code Status:  full code  Labs   CBC: Recent Labs  Lab 07/10/22 0112 07/10/22 0122 07/10/22 0252 07/10/22 0429  WBC  --  6.7  --   --   HGB 12.2* 12.2* 11.6* 10.2*  HCT 36.0* 39.5 34.0* 30.0*  MCV  --  91.0  --   --   PLT  --  166  --   --     Basic Metabolic Panel: Recent Labs  Lab 07/10/22 0112  07/10/22 0122 07/10/22 0252 07/10/22 0429  NA 140 141 141 142  K 4.3 4.3 4.2 3.9  CL  --  110  --   --   CO2  --  21*  --   --   GLUCOSE  --  206*  --   --   BUN  --  25*  --   --   CREATININE  --  2.03*  --   --   CALCIUM  --  8.8*  --   --   MG  --  2.1  --   --   PHOS  --  6.7*  --   --    GFR: Estimated Creatinine Clearance: 39.8 mL/min (A) (by C-G formula based on SCr of 2.03 mg/dL (H)). Recent Labs  Lab 07/10/22 0122  PROCALCITON 0.12  WBC 6.7  LATICACIDVEN 3.6*    Liver Function Tests: Recent Labs  Lab 07/10/22 0122  AST 68*  ALT 58*  ALKPHOS 97  BILITOT 0.2*  PROT 6.8  ALBUMIN 3.8   No results for input(s): "LIPASE", "AMYLASE" in the last 168 hours. No results for input(s): "AMMONIA" in the last 168 hours.  ABG    Component Value Date/Time   PHART 7.255 (L) 07/10/2022 0429   PCO2ART 51.6 (H) 07/10/2022 0429   PO2ART 92 07/10/2022 0429   HCO3 23.2 07/10/2022 0429   TCO2 25 07/10/2022 0429   ACIDBASEDEF 4.0 (H) 07/10/2022 0429   O2SAT 96 07/10/2022 0429     Coagulation Profile: Recent Labs  Lab 07/10/22 0122  INR 1.1    Cardiac Enzymes: No results for input(s): "CKTOTAL", "CKMB", "CKMBINDEX", "TROPONINI" in the last 168 hours.  HbA1C: Hgb A1c MFr Bld  Date/Time Value Ref Range Status  01/07/2022 02:25 PM 7.0 (H) 4.8 - 5.6 % Final    Comment:    (NOTE) Pre diabetes:          5.7%-6.4%  Diabetes:              >6.4%  Glycemic control for   <7.0% adults with diabetes   10/09/2021 06:04 AM 7.0 (H) 4.8 - 5.6 % Final    Comment:    (NOTE) Pre diabetes:          5.7%-6.4%  Diabetes:              >6.4%  Glycemic control for   <7.0% adults with diabetes     CBG: Recent Labs  Lab 07/10/22 0342 07/10/22 0730  GLUCAP 180* 118*    Review of Systems:     Past Medical History:  He,  has a past medical history of Arthritis, CHF (congestive heart failure) (HCC), Chronic bronchitis (HCC), Chronic kidney disease, COPD (chronic  obstructive pulmonary disease) (HCC), Diabetes mellitus without complication (HCC), High cholesterol,  and Hypertension.   Surgical History:   Past Surgical History:  Procedure Laterality Date   CORONARY ANGIOGRAPHY N/A 06/28/2020   Procedure: CORONARY ANGIOGRAPHY (CATH LAB);  Surgeon: [name], MD;  Location: [place] [facility];  Service: Cardiovascular;  Laterality: N/A;   JOINT REPLACEMENT     KNEE CARTILAGE SURGERY Bilateral    MULTIPLE TOOTH EXTRACTIONS     TOTAL KNEE ARTHROPLASTY Left 12/24/2017   TOTAL KNEE ARTHROPLASTY Left 12/24/2017   Procedure: LEFT TOTAL KNEE ARTHROPLASTY;  Surgeon: Renette Butters, MD;  Location: [place];  Service: Orthopedics;  Laterality: Left;   TOTAL KNEE ARTHROPLASTY Right 05/30/2021   Procedure: RIGHT TOTAL KNEE ARTHROPLASTY;  Surgeon: Renette Butters, MD;  Location: [facility];  Service: Orthopedics;  Laterality: Right;     Social History:   reports that he has been smoking cigarettes. He has a 12.25 pack-year smoking history. He has never used smokeless tobacco. He reports that he does not currently use drugs after having used the following drugs: Cocaine. He reports that he does not drink alcohol.   Family History:  His family history is not on file.   Allergies No Known Allergies   Home Medications  Prior to Admission medications   Medication Sig Start Date End Date Taking? Authorizing Provider  acetaminophen (TYLENOL) 500 MG tablet Take 1,000 mg by mouth every 6 (six) hours as needed for mild pain.    [provider]  acetaminophen (TYLENOL) 500 MG tablet Take 500-1,000 mg by mouth every 8 (eight) hours as needed for mild pain or headache.    [provider]  albuterol (VENTOLIN HFA) 108 (90 Base) MCG/ACT inhaler Inhale 2-4 puffs into the lungs See admin instructions. Inhale 2-4 puffs into the lungs every four to six hours as needed for shortness of breath or wheezing 09/30/21   [provider]  amLODipine (NORVASC) 5 MG  tablet Take 1 tablet (5 mg total) by mouth daily. 03/31/20   [name], MD  amLODipine (NORVASC) 5 MG tablet Take 5 mg by mouth daily. 05/25/21   [provider]  amLODipine (NORVASC) 5 MG tablet Take 5 mg by mouth daily. 12/19/21   [provider]  aspirin 81 MG chewable tablet Chew 1 tablet (81 mg total) by mouth 2 (two) times daily. To prevent blood clots after surgery 05/31/21   Matthew Saras, PA-C  aspirin EC 81 MG tablet Take 81 mg by mouth daily. Swallow whole.    [provider]  atorvastatin (LIPITOR) 40 MG tablet Take 40 mg by mouth every evening. 02/16/20   [provider]  atorvastatin (LIPITOR) 40 MG tablet Take 40 mg by mouth every evening. 05/25/21   [provider]  BAYER ASPIRIN EC LOW DOSE 81 MG EC tablet Take 81 mg by mouth daily. Swallow whole.    [provider]  Cholecalciferol (VITAMIN D3 PO) Take 1 capsule by mouth daily with breakfast.    [provider]  cloNIDine (CATAPRES) 0.2 MG tablet Take 0.2 mg by mouth at bedtime.    [provider]  cloNIDine (CATAPRES) 0.2 MG tablet Take 1 tablet (0.2 mg total) by mouth 3 (three) times daily. 10/12/21   Patrecia Pour, MD  cloNIDine (CATAPRES) 0.2 MG tablet Take 0.2 mg by mouth in the morning and at bedtime. 10/12/21   [provider]  Cyanocobalamin (B-12 PO) Take 1 tablet by mouth daily.    [provider]  Cyanocobalamin (VITAMIN B-12 PO) Take 1 tablet by  mouth daily with breakfast.    [provider]  docusate sodium (COLACE) 100 MG capsule 1 po BID while on narcotics  STOOL SOFTNER 05/31/21   Shepperson, Kirstin, PA-C  docusate sodium (COLACE) 100 MG capsule Take 100 mg by mouth daily.    [provider]  docusate sodium (COLACE) 100 MG capsule Take 100 mg by mouth daily.    [provider]  doxycycline (VIBRAMYCIN) 100 MG capsule Take 1 capsule (100 mg total) by mouth 2 (two) times daily. 10/12/21   Tyrone Nine, MD  empagliflozin (JARDIANCE) 10 MG TABS tablet Take 1 tablet (10 mg total) by mouth daily. 10/12/21   Tyrone Nine, MD  hydrALAZINE (APRESOLINE) 100 MG tablet Take 1 tablet (100 mg total) by mouth 3 (three) times daily. 10/12/21   Tyrone Nine, MD  hydrALAZINE (APRESOLINE) 50 MG tablet Take 1 tablet (50 mg total) by mouth 3 (three) times daily. 06/29/20   Lanae Boast, MD  hydrALAZINE (APRESOLINE) 50 MG tablet Take 50 mg by mouth 2 (two) times daily. 11/13/21   [provider]  hydrocortisone cream 1 % Apply 1 application topically daily as needed for itching.    [provider]  isosorbide mononitrate (IMDUR) 120 MG 24 hr tablet Take 120 mg by mouth at bedtime.    [provider]  isosorbide mononitrate (IMDUR) 120 MG 24 hr tablet Take 1 tablet (120 mg total) by mouth daily. 10/12/21   Tyrone Nine, MD  isosorbide mononitrate (IMDUR) 120 MG 24 hr tablet Take 120 mg by mouth daily. 10/12/21   [provider]  linagliptin (TRADJENTA) 5 MG TABS tablet Take 1 tablet (5 mg total) by mouth daily. 02/07/21   Orpah Cobb, MD  Menthol, Topical Analgesic, (BIOFREEZE EX) Apply 1 application topically daily.    [provider]  metoprolol tartrate (LOPRESSOR) 25 MG tablet Take 1 tablet (25 mg total) by mouth 2 (two) times daily. 02/06/21   Orpah Cobb, MD  metoprolol tartrate (LOPRESSOR) 25 MG tablet Take 25 mg by mouth at bedtime.    [provider]  Omega-3 Fatty Acids (FISH OIL PO) Take 1 capsule by mouth daily.    [provider]  oxyCODONE (OXY IR/ROXICODONE) 5 MG immediate release tablet 1 tablet po q 4 hrs prn pain patient had total knee replacement on 05/31/21 05/31/21   Shepperson, Kirstin, PA-C  polyethylene glycol (MIRALAX / GLYCOLAX) 17 g packet 17grams in 6 oz of SOMETHING TO [place] twice a day until bowel movement.  LAXITIVE.  Restart if two days since last bowel movement 05/31/21   Shepperson, Kirstin, PA-C  predniSONE (DELTASONE) 20  MG tablet Take 2 tablets (40 mg total) by mouth daily before breakfast. 10/13/21   Tyrone Nine, MD  PROAIR HFA 108 [phone] Base) MCG/ACT inhaler Inhale 2 puffs into the lungs every 4 (four) hours as needed for shortness of breath. 12/22/20   Nyoka Cowden, MD  Pyridoxine HCl (VITAMIN B-6 PO) Take 1 tablet by mouth daily with breakfast.    [provider]  torsemide (DEMADEX) 20 MG tablet Take 20 mg by mouth See admin instructions. Take 20 mg by mouth on Monday and Thursday as needed for fluid 02/28/21   [provider]  torsemide (DEMADEX) 20 MG tablet Take 20 mg by mouth See admin instructions.  on Monday's and Thursday's as needed for fluid    [provider]  VENTOLIN HFA 108 (90 Base) MCG/ACT inhaler Inhale 2 puffs into  the lungs 4 (four) times daily as needed for wheezing or shortness of breath. 01/03/22   [provider]

## 2022-07-10 NOTE — ED Notes (Addendum)
Intubated by MD Cardama

## 2022-07-11 ENCOUNTER — Inpatient Hospital Stay (HOSPITAL_COMMUNITY): Payer: Medicare Other

## 2022-07-11 DIAGNOSIS — E440 Moderate protein-calorie malnutrition: Secondary | ICD-10-CM | POA: Insufficient documentation

## 2022-07-11 LAB — POCT I-STAT 7, (LYTES, BLD GAS, ICA,H+H)
Acid-base deficit: 2 mmol/L (ref 0.0–2.0)
Bicarbonate: 24.1 mmol/L (ref 20.0–28.0)
Calcium, Ion: 1.27 mmol/L (ref 1.15–1.40)
HCT: 29 % — ABNORMAL LOW (ref 39.0–52.0)
Hemoglobin: 9.9 g/dL — ABNORMAL LOW (ref 13.0–17.0)
O2 Saturation: 93 %
Patient temperature: 96.4
Potassium: 4.4 mmol/L (ref 3.5–5.1)
Sodium: 141 mmol/L (ref 135–145)
TCO2: 26 mmol/L (ref 22–32)
pCO2 arterial: 45.7 mmHg (ref 32–48)
pH, Arterial: 7.324 — ABNORMAL LOW (ref 7.35–7.45)
pO2, Arterial: 70 mmHg — ABNORMAL LOW (ref 83–108)

## 2022-07-11 LAB — BASIC METABOLIC PANEL
Anion gap: 9 (ref 5–15)
BUN: 37 mg/dL — ABNORMAL HIGH (ref 8–23)
CO2: 23 mmol/L (ref 22–32)
Calcium: 8.8 mg/dL — ABNORMAL LOW (ref 8.9–10.3)
Chloride: 110 mmol/L (ref 98–111)
Creatinine, Ser: 2.24 mg/dL — ABNORMAL HIGH (ref 0.61–1.24)
GFR, Estimated: 32 mL/min — ABNORMAL LOW (ref >60)
Glucose, Bld: 124 mg/dL — ABNORMAL HIGH (ref 70–99)
Potassium: 4.4 mmol/L (ref 3.5–5.1)
Sodium: 142 mmol/L (ref 135–145)

## 2022-07-11 LAB — CBC
HCT: 32 % — ABNORMAL LOW (ref 39.0–52.0)
Hemoglobin: 10.1 g/dL — ABNORMAL LOW (ref 13.0–17.0)
MCH: 27.7 pg (ref 26.0–34.0)
MCHC: 31.6 g/dL (ref 30.0–36.0)
MCV: 87.9 fL (ref 80.0–100.0)
Platelets: 117 10*3/uL — ABNORMAL LOW (ref 150–400)
RBC: 3.64 MIL/uL — ABNORMAL LOW (ref 4.22–5.81)
RDW: 13.6 % (ref 11.5–15.5)
WBC: 6.8 10*3/uL (ref 4.0–10.5)
nRBC: 0 % (ref 0.0–0.2)

## 2022-07-11 LAB — HEMOGLOBIN A1C
Hgb A1c MFr Bld: 6.6 % — ABNORMAL HIGH (ref 4.8–5.6)
Mean Plasma Glucose: 142.72 mg/dL

## 2022-07-11 LAB — GLUCOSE, CAPILLARY
Glucose-Capillary: 102 mg/dL — ABNORMAL HIGH (ref 70–99)
Glucose-Capillary: 93 mg/dL (ref 70–99)
Glucose-Capillary: 126 mg/dL — ABNORMAL HIGH (ref 70–99)
Glucose-Capillary: 194 mg/dL — ABNORMAL HIGH (ref 70–99)
Glucose-Capillary: 85 mg/dL (ref 70–99)

## 2022-07-11 LAB — URINE CULTURE: Culture: NO GROWTH

## 2022-07-11 LAB — PHOSPHORUS: Phosphorus: 3.9 mg/dL (ref 2.5–4.6)

## 2022-07-11 LAB — MAGNESIUM: Magnesium: 2.3 mg/dL (ref 1.7–2.4)

## 2022-07-11 LAB — TRIGLYCERIDES: Triglycerides: 78 mg/dL (ref <150)

## 2022-07-11 MED ORDER — HYDRALAZINE HCL 20 MG/ML IJ SOLN
10.0000 mg | INTRAMUSCULAR | Status: DC | PRN
  Administered 2022-07-11 – 2022-07-12 (×2): 10 mg via INTRAVENOUS
  Filled 2022-07-11 (×2): qty 1

## 2022-07-11 MED ORDER — ATORVASTATIN CALCIUM 40 MG PO TABS
40.0000 mg | ORAL_TABLET | Freq: Every day | ORAL | Status: DC
  Administered 2022-07-12 – 2022-07-15 (×4): 40 mg via ORAL
  Filled 2022-07-11 (×4): qty 1

## 2022-07-11 MED ORDER — ACETAMINOPHEN 325 MG PO TABS: 650.0000 mg | ORAL_TABLET | Freq: Four times a day (QID) | ORAL | Status: DC | PRN

## 2022-07-11 MED ORDER — POLYETHYLENE GLYCOL 3350 17 G PO PACK
17.0000 g | PACK | Freq: Every day | ORAL | Status: DC
  Filled 2022-07-11: qty 1

## 2022-07-11 MED ORDER — ISOSORBIDE MONONITRATE ER 60 MG PO TB24
120.0000 mg | ORAL_TABLET | Freq: Every day | ORAL | Status: DC
  Administered 2022-07-11 – 2022-07-14 (×4): 120 mg via ORAL
  Filled 2022-07-11: qty 4
  Filled 2022-07-11 (×2): qty 2
  Filled 2022-07-11: qty 4

## 2022-07-11 MED ORDER — ORAL CARE MOUTH RINSE
15.0000 mL | Freq: Three times a day (TID) | OROMUCOSAL | Status: DC
  Administered 2022-07-11: 15 mL via OROMUCOSAL

## 2022-07-11 MED ORDER — DOCUSATE SODIUM 50 MG/5ML PO LIQD: 100.0000 mg | Freq: Two times a day (BID) | ORAL | Status: DC

## 2022-07-11 MED ORDER — AMLODIPINE BESYLATE 5 MG PO TABS
5.0000 mg | ORAL_TABLET | Freq: Every day | ORAL | Status: DC
  Administered 2022-07-11: 5 mg via ORAL
  Filled 2022-07-11 (×2): qty 1

## 2022-07-11 MED ORDER — PREDNISONE 20 MG PO TABS
40.0000 mg | ORAL_TABLET | Freq: Every day | ORAL | Status: AC
  Administered 2022-07-12 – 2022-07-14 (×3): 40 mg via ORAL
  Filled 2022-07-11 (×3): qty 2

## 2022-07-11 MED ORDER — SODIUM CHLORIDE 0.9 % IV SOLN
500.0000 mg | Freq: Every day | INTRAVENOUS | Status: DC
  Administered 2022-07-11: 500 mg via INTRAVENOUS
  Filled 2022-07-11: qty 5

## 2022-07-11 MED ORDER — INSULIN ASPART 100 UNIT/ML IJ SOLN
0.0000 [IU] | Freq: Three times a day (TID) | INTRAMUSCULAR | Status: DC
  Administered 2022-07-12 (×2): 3 [IU] via SUBCUTANEOUS
  Administered 2022-07-13 – 2022-07-14 (×3): 4 [IU] via SUBCUTANEOUS
  Administered 2022-07-14: 3 [IU] via SUBCUTANEOUS
  Administered 2022-07-14: 4 [IU] via SUBCUTANEOUS
  Administered 2022-07-15: 15 [IU] via SUBCUTANEOUS

## 2022-07-11 MED ORDER — ASPIRIN 81 MG PO CHEW
81.0000 mg | CHEWABLE_TABLET | Freq: Every day | ORAL | Status: DC
  Administered 2022-07-12 – 2022-07-15 (×4): 81 mg via ORAL
  Filled 2022-07-11 (×4): qty 1

## 2022-07-11 MED ORDER — HYDRALAZINE HCL 50 MG PO TABS
50.0000 mg | ORAL_TABLET | Freq: Two times a day (BID) | ORAL | Status: DC
  Administered 2022-07-11 (×2): 50 mg via ORAL
  Filled 2022-07-11 (×3): qty 1

## 2022-07-11 MED ORDER — SODIUM CHLORIDE 0.9 % IV SOLN
2.0000 g | Freq: Every day | INTRAVENOUS | Status: AC
  Administered 2022-07-11 – 2022-07-14 (×4): 2 g via INTRAVENOUS
  Filled 2022-07-11 (×4): qty 20

## 2022-07-11 MED ORDER — DOCUSATE SODIUM 100 MG PO CAPS
100.0000 mg | ORAL_CAPSULE | Freq: Two times a day (BID) | ORAL | Status: DC
  Administered 2022-07-11 – 2022-07-13 (×3): 100 mg via ORAL
  Filled 2022-07-11 (×5): qty 1

## 2022-07-11 NOTE — Progress Notes (Signed)
NAME:  Phillip Kennedy, MRN:  [id number], DOB:  12-Aug-1956, LOS: 1 ADMISSION DATE:  07/10/2022, CONSULTATION DATE:  9/19 REFERRING MD:  EDP, CHIEF COMPLAINT:  Respiratory failure   History of Present Illness:  66 yo male smoker brought from home by EMS to [facility] ER with shortness of breath.  On arrival to ER he was in respiratory distress and noted to have diffuse wheezing.  Blood pressure was 237/166 and started on NTG gtt in the ER.  He became lethargic and required intubation.  ABG showed severe acute respiratory acidosis.  Started on ABx for pneumonia, and therapy for COPD exacerbation.  UDS positive for cocaine.  PCCM asked to admit to ICU.   Hx from chart and medical team.    Pertinent  Medical History  Combined CHF, COPD, CKD 3b, DM type 2, HLD, HTN, Arthritis, CAD, Lymphedema, VDRF March 2023  Significant Hospital Events: Including procedures, antibiotic start and stop dates in addition to other pertinent events   Admitted to ICU. Intubated. CTX and Zithromax started Weaned off of sedation  Interim History / Subjective:  Overnight, no acute events. Normotensive and afebrile.  Objective   Blood pressure (!) 133/96, pulse (!) 52, temperature (!) 97.3 F (36.3 C), temperature source Bladder, resp. rate (!) 24, height 5\' 9"  (1.753 m), weight 91.8 kg, SpO2 100 %.    Vent Mode: PRVC FiO2 (%):  [30 %-60 %] 30 % Set Rate:  [24 bmp-30 bmp] 24 bmp Vt Set:  [560 mL] 560 mL PEEP:  [5 cmH20-8 cmH20] 5 cmH20 Plateau Pressure:  [16 cmH20-18 cmH20] 16 cmH20   Intake/Output Summary (Last 24 hours) at 07/11/2022 0810 Last data filed at 07/11/2022 0700 Gross per 24 hour  Intake 1663.77 ml  Output 960 ml  Net 703.77 ml   Filed Weights   07/10/22 0117 07/10/22 0340 07/11/22 0500  Weight: 95 kg 90.8 kg 91.8 kg   Labs Glucose well controlled Pending AM labs  Examination: General: Intubated with ETT in place, sedated on PRVC HENT: Opens eyes, pupils reactive Lungs: Lungs clear with good  air movement bilaterally Cardiovascular: RRR, no murmurs Abdomen: Soft, NTND Extremities: Warm, well-perfused. 1+ pretibial edema BLLE Neuro: Follows commands and arousable  Resolved Hospital Problem list     Assessment & Plan:  Acute hypoxic/hypercapnic respiratory failure  COPD exacerbation Wean off of sedation this morning. Change PRVC to PSV with extubation this afternoon if tolerated well. - goal SpO2 90 to 95% - yupelri, brovana, pulmicort - PRN albuterol - Solu-medrol 40 mg IV daily   Rt upper lobe community acquired pneumonia. Lactic acidosis - Continue CTX and Azithromycin - f/u blood/sputum culture, pneumococcal/legionella ag,    Hypertensive emergency from cocaine. Acute on chronic combined CHF. Hx of CAD, HLD. Normotensive. Echo with LVEF 45-50% and global hypokinesis with G1DD- will need o/p cardiology f/u - Hold amlodipine and norvasc 10 mg daily,  catapres 0.1 mg bid, hydralazine 25 mg q8h, lopressor 50 mg bid - s/p lasix 40 mg IV x1 - continue ASA, lipitor   DM type 2 poorly controlled with steroid induced hyperglycemia. - SSI - f/u HbA1C   Acute toxic-metabolic encephalopathy 2nd to cocaine and hypercapnia. - no acute findings on CT head from 9/19 - diprivan/fentanyl gtts with prn versed   Best Practice (right click and "Reselect all SmartList Selections" daily)   Diet/type: tubefeeds- d/c'ed this AM DVT prophylaxis: LMWH GI prophylaxis: N/A Lines: N/A Foley:  Yes, and it is still needed Code Status:  full  code  Labs   CBC: Recent Labs  Lab 07/10/22 0122 07/10/22 0252 07/10/22 0429 07/10/22 1015 07/11/22 0337  WBC 6.7  --   --   --   --   HGB 12.2* 11.6* 10.2* 10.2* 9.9*  HCT 39.5 34.0* 30.0* 30.0* 29.0*  MCV 91.0  --   --   --   --   PLT 166  --   --   --   --     Basic Metabolic Panel: Recent Labs  Lab 07/10/22 0122 07/10/22 0252 07/10/22 0429 07/10/22 1015 07/11/22 0337  NA 141 141 142 140 141  K 4.3 4.2 3.9 4.2 4.4  CL 110   --   --   --   --   CO2 21*  --   --   --   --   GLUCOSE 206*  --   --   --   --   BUN 25*  --   --   --   --   CREATININE 2.03*  --   --   --   --   CALCIUM 8.8*  --   --   --   --   MG 2.1  --   --   --   --   PHOS 6.7*  --   --   --   --    GFR: Estimated Creatinine Clearance: 40 mL/min (A) (by C-G formula based on SCr of 2.03 mg/dL (H)). Recent Labs  Lab 07/10/22 0122 07/10/22 1030 07/10/22 1236  PROCALCITON 0.12  --   --   WBC 6.7  --   --   LATICACIDVEN 3.6* 2.4* 2.5*    Liver Function Tests: Recent Labs  Lab 07/10/22 0122  AST 68*  ALT 58*  ALKPHOS 97  BILITOT 0.2*  PROT 6.8  ALBUMIN 3.8   No results for input(s): "LIPASE", "AMYLASE" in the last 168 hours. No results for input(s): "AMMONIA" in the last 168 hours.  ABG    Component Value Date/Time   PHART 7.324 (L) 07/11/2022 0337   PCO2ART 45.7 07/11/2022 0337   PO2ART 70 (L) 07/11/2022 0337   HCO3 24.1 07/11/2022 0337   TCO2 26 07/11/2022 0337   ACIDBASEDEF 2.0 07/11/2022 0337   O2SAT 93 07/11/2022 0337     Coagulation Profile: Recent Labs  Lab 07/10/22 0122  INR 1.1    Cardiac Enzymes: No results for input(s): "CKTOTAL", "CKMB", "CKMBINDEX", "TROPONINI" in the last 168 hours.  HbA1C: Hgb A1c MFr Bld  Date/Time Value Ref Range Status  01/07/2022 02:25 PM 7.0 (H) 4.8 - 5.6 % Final    Comment:    (NOTE) Pre diabetes:          5.7%-6.4%  Diabetes:              >6.4%  Glycemic control for   <7.0% adults with diabetes   10/09/2021 06:04 AM 7.0 (H) 4.8 - 5.6 % Final    Comment:    (NOTE) Pre diabetes:          5.7%-6.4%  Diabetes:              >6.4%  Glycemic control for   <7.0% adults with diabetes     CBG: Recent Labs  Lab 07/10/22 1504 07/10/22 1948 07/10/22 2350 07/11/22 0327 07/11/22 0727  GLUCAP 179* 119* 124* 102* 93    Review of Systems:   Unable to obtain  Past Medical History:  He,  has a past medical history  of Arthritis, CHF (congestive heart failure)  (HCC), Chronic bronchitis (HCC), Chronic kidney disease, COPD (chronic obstructive pulmonary disease) (HCC), Diabetes mellitus without complication (HCC), High cholesterol, and Hypertension.   Surgical History:   Past Surgical History:  Procedure Laterality Date   CORONARY ANGIOGRAPHY N/A 06/28/2020   Procedure: CORONARY ANGIOGRAPHY (CATH LAB);  Surgeon: Orpah Cobb, MD;  Location: [facility] IN[facility];  Service: Cardiovascular;  Laterality: N/A;   JOINT REPLACEMENT     KNEE CARTILAGE SURGERY Bilateral    MULTIPLE TOOTH EXTRACTIONS     TOTAL KNEE ARTHROPLASTY Left 12/24/2017   TOTAL KNEE ARTHROPLASTY Left 12/24/2017   Procedure: LEFT TOTAL KNEE ARTHROPLASTY;  Surgeon: Sheral Apley, MD;  Location: [facility];  Service: Orthopedics;  Laterality: Left;   TOTAL KNEE ARTHROPLASTY Right 05/30/2021   Procedure: RIGHT TOTAL KNEE ARTHROPLASTY;  Surgeon: Sheral Apley, MD;  Location: [facility];  Service: Orthopedics;  Laterality: Right;     Social History:   reports that he has been smoking cigarettes. He has a 12.25 pack-year smoking history. He has never used smokeless tobacco. He reports that he does not currently use drugs after having used the following drugs: Cocaine. He reports that he does not drink alcohol.   Family History:  His family history is not on file.   Allergies No Known Allergies   Home Medications  Prior to Admission medications   Medication Sig Start Date End Date Taking? Authorizing Provider  acetaminophen (TYLENOL) 500 MG tablet Take 500-1,000 mg by mouth every 8 (eight) hours as needed for mild pain or headache.    [provider]  albuterol (VENTOLIN HFA) 108 (90 Base) MCG/ACT inhaler Inhale 2-4 puffs into the lungs See admin instructions. Inhale 2-4 puffs into the lungs every four to six hours as needed for shortness of breath or wheezing 09/30/21   [provider]  amLODipine (NORVASC) 5 MG tablet Take 1 tablet (5 mg total) by mouth daily. 03/31/20    Orpah Cobb, MD  aspirin 81 MG chewable tablet Chew 1 tablet (81 mg total) by mouth 2 (two) times daily. To prevent blood clots after surgery 05/31/21   Julien Girt, PA-C  aspirin EC 81 MG tablet Take 81 mg by mouth daily. Swallow whole.    [provider]  atorvastatin (LIPITOR) 40 MG tablet Take 40 mg by mouth every evening. 05/25/21   [provider]  BAYER ASPIRIN EC LOW DOSE 81 MG EC tablet Take 81 mg by mouth daily. Swallow whole.    [provider]  Cholecalciferol (VITAMIN D3 PO) Take 1 capsule by mouth daily with breakfast.    [provider]  cloNIDine (CATAPRES) 0.2 MG tablet Take 1 tablet (0.2 mg total) by mouth 3 (three) times daily. 10/12/21   Tyrone Nine, MD  Cyanocobalamin (VITAMIN B-12 PO) Take 1 tablet by mouth daily with breakfast.    [provider]  docusate sodium (COLACE) 100 MG capsule 1 po BID while on narcotics  STOOL SOFTNER 05/31/21   Shepperson, Kirstin, PA-C  doxycycline (VIBRAMYCIN) 100 MG capsule Take 1 capsule (100 mg total) by mouth 2 (two) times daily. 10/12/21   Tyrone Nine, MD  empagliflozin (JARDIANCE) 10 MG TABS tablet Take 1 tablet (10 mg total) by mouth daily. 10/12/21   Tyrone Nine, MD  hydrALAZINE (APRESOLINE) 100 MG tablet Take 1 tablet (100 mg total) by mouth 3 (three) times daily. 10/12/21   Tyrone Nine, MD  hydrALAZINE (APRESOLINE) 50 MG tablet Take  1 tablet (50 mg total) by mouth 3 (three) times daily. 06/29/20   Antonieta Pert, MD  hydrocortisone cream 1 % Apply 1 application topically daily as needed for itching.    [provider]  isosorbide mononitrate (IMDUR) 120 MG 24 hr tablet Take 1 tablet (120 mg total) by mouth daily. 10/12/21   Patrecia Pour, MD  linagliptin (TRADJENTA) 5 MG TABS tablet Take 1 tablet (5 mg total) by mouth daily. 02/07/21   [name], MD  Menthol, Topical Analgesic, (BIOFREEZE EX) Apply 1 application topically daily.    [provider]  metoprolol  tartrate (LOPRESSOR) 25 MG tablet Take 1 tablet (25 mg total) by mouth 2 (two) times daily. 02/06/21   [name], MD  Omega-3 Fatty Acids (FISH OIL PO) Take 1 capsule by mouth daily.    [provider]  oxyCODONE (OXY IR/ROXICODONE) 5 MG immediate release tablet 1 tablet po q 4 hrs prn pain patient had total knee replacement on 05/31/21 05/31/21   Shepperson, Kirstin, PA-C  polyethylene glycol (MIRALAX / GLYCOLAX) 17 g packet 17grams in 6 oz of SOMETHING TO [place] twice a day until bowel movement.  LAXITIVE.  Restart if two days since last bowel movement 05/31/21   Shepperson, Kirstin, PA-C  predniSONE (DELTASONE) 20 MG tablet Take 2 tablets (40 mg total) by mouth daily before breakfast. 10/13/21   Patrecia Pour, MD  PROAIR HFA 108 [phone] Base) MCG/ACT inhaler Inhale 2 puffs into the lungs every 4 (four) hours as needed for shortness of breath. 12/22/20   Tanda Rockers, MD  Pyridoxine HCl (VITAMIN B-6 PO) Take 1 tablet by mouth daily with breakfast.    [provider]  torsemide (DEMADEX) 20 MG tablet Take 20 mg by mouth See admin instructions. Take 20 mg by mouth on Monday and Thursday as needed for fluid 02/28/21   [provider]  VENTOLIN HFA 108 (90 Base) MCG/ACT inhaler Inhale 2 puffs into the lungs 4 (four) times daily as needed for wheezing or shortness of breath. 01/03/22   [provider]

## 2022-07-11 NOTE — Progress Notes (Signed)
eLink Physician-Brief Progress Note Patient Name: Phillip Kennedy DOB: 12-Jul-1956 MRN: [id number]   Date of Service  07/11/2022  HPI/Events of Note  Patient needs a PRN medication order for hypertension. He also needs his CBG + SSI order changed to [facility]  & HS.  eICU Interventions  PRN Hydralazine ordered, CBG / SSI changed to [facility]  & HS.        Frederik Pear 07/11/2022, 8:35 PM

## 2022-07-11 NOTE — Procedures (Signed)
Extubation Procedure Note  Patient Details:   Name: Phillip Kennedy DOB: May 27, 1956 MRN: [id number]   Airway Documentation:    Vent end date: 07/11/22 Vent end time: 0930   Evaluation  O2 sats: stable throughout Complications: No apparent complications Patient did tolerate procedure well. Bilateral Breath Sounds: Rhonchi, Inspiratory wheezes   Yes  Alvera Singh 07/11/2022, 9:36 AM

## 2022-07-12 LAB — PHOSPHORUS: Phosphorus: 1.3 mg/dL — ABNORMAL LOW (ref 2.5–4.6)

## 2022-07-12 LAB — CBC
HCT: 34.8 % — ABNORMAL LOW (ref 39.0–52.0)
Hemoglobin: 11.2 g/dL — ABNORMAL LOW (ref 13.0–17.0)
MCH: 27.5 pg (ref 26.0–34.0)
MCHC: 32.2 g/dL (ref 30.0–36.0)
MCV: 85.3 fL (ref 80.0–100.0)
Platelets: 145 10*3/uL — ABNORMAL LOW (ref 150–400)
RBC: 4.08 MIL/uL — ABNORMAL LOW (ref 4.22–5.81)
RDW: 13.5 % (ref 11.5–15.5)
WBC: 9.5 10*3/uL (ref 4.0–10.5)
nRBC: 0 % (ref 0.0–0.2)

## 2022-07-12 LAB — GLUCOSE, CAPILLARY
Glucose-Capillary: 91 mg/dL (ref 70–99)
Glucose-Capillary: 126 mg/dL — ABNORMAL HIGH (ref 70–99)
Glucose-Capillary: 124 mg/dL — ABNORMAL HIGH (ref 70–99)
Glucose-Capillary: 141 mg/dL — ABNORMAL HIGH (ref 70–99)
Glucose-Capillary: 140 mg/dL — ABNORMAL HIGH (ref 70–99)

## 2022-07-12 LAB — BASIC METABOLIC PANEL
Anion gap: 7 (ref 5–15)
BUN: 34 mg/dL — ABNORMAL HIGH (ref 8–23)
CO2: 25 mmol/L (ref 22–32)
Calcium: 9.1 mg/dL (ref 8.9–10.3)
Chloride: 108 mmol/L (ref 98–111)
Creatinine, Ser: 1.68 mg/dL — ABNORMAL HIGH (ref 0.61–1.24)
GFR, Estimated: 45 mL/min — ABNORMAL LOW (ref >60)
Glucose, Bld: 104 mg/dL — ABNORMAL HIGH (ref 70–99)
Potassium: 3.7 mmol/L (ref 3.5–5.1)
Sodium: 140 mmol/L (ref 135–145)

## 2022-07-12 LAB — MAGNESIUM: Magnesium: 2 mg/dL (ref 1.7–2.4)

## 2022-07-12 LAB — STREP PNEUMONIAE URINARY ANTIGEN: Strep Pneumo Urinary Antigen: NEGATIVE

## 2022-07-12 MED ORDER — AMLODIPINE BESYLATE 10 MG PO TABS
10.0000 mg | ORAL_TABLET | Freq: Every day | ORAL | Status: DC
  Administered 2022-07-12 – 2022-07-15 (×4): 10 mg via ORAL
  Filled 2022-07-12 (×4): qty 1

## 2022-07-12 MED ORDER — HYDRALAZINE HCL 50 MG PO TABS
100.0000 mg | ORAL_TABLET | Freq: Three times a day (TID) | ORAL | Status: DC
  Administered 2022-07-12 – 2022-07-14 (×9): 100 mg via ORAL
  Filled 2022-07-12 (×9): qty 2

## 2022-07-12 MED ORDER — LABETALOL HCL 5 MG/ML IV SOLN
10.0000 mg | INTRAVENOUS | Status: DC | PRN
  Administered 2022-07-12 – 2022-07-14 (×5): 10 mg via INTRAVENOUS
  Filled 2022-07-12 (×4): qty 4

## 2022-07-12 MED ORDER — AZITHROMYCIN 500 MG PO TABS
500.0000 mg | ORAL_TABLET | Freq: Once | ORAL | Status: AC
  Administered 2022-07-12: 500 mg via ORAL
  Filled 2022-07-12: qty 1

## 2022-07-12 MED ORDER — CARVEDILOL 12.5 MG PO TABS
12.5000 mg | ORAL_TABLET | Freq: Two times a day (BID) | ORAL | Status: DC
  Administered 2022-07-12 – 2022-07-13 (×3): 12.5 mg via ORAL
  Filled 2022-07-12 (×3): qty 1

## 2022-07-12 MED ORDER — HYDRALAZINE HCL 20 MG/ML IJ SOLN
10.0000 mg | INTRAMUSCULAR | Status: DC | PRN
  Administered 2022-07-12: 20 mg via INTRAVENOUS
  Filled 2022-07-12: qty 1

## 2022-07-12 MED ORDER — LABETALOL HCL 5 MG/ML IV SOLN
10.0000 mg | Freq: Once | INTRAVENOUS | Status: AC
  Administered 2022-07-12: 10 mg via INTRAVENOUS
  Filled 2022-07-12: qty 4

## 2022-07-12 NOTE — Progress Notes (Signed)
NAME:  Phillip Kennedy, MRN:  [id number], DOB:  1956/08/30, LOS: 2 ADMISSION DATE:  07/10/2022, CONSULTATION DATE:  9/19 REFERRING MD: EDP, CHIEF COMPLAINT:  Respiratory failure   History of Present Illness:  66 yo male smoker brought from home by EMS to [facility] ER with shortness of breath.  On arrival to ER he was in respiratory distress and noted to have diffuse wheezing.  Blood pressure was 237/166 and started on NTG gtt in the ER.  He became lethargic and required intubation.  ABG showed severe acute respiratory acidosis.  Started on ABx for pneumonia, and therapy for COPD exacerbation.  UDS positive for cocaine.  PCCM asked to admit to ICU.   Hx from chart and medical team.    Pertinent  Medical History  Combined CHF, COPD, CKD 3b, DM type 2, HLD, HTN, Arthritis, CAD, Lymphedema, VDRF March 2023  Significant Hospital Events: Including procedures, antibiotic start and stop dates in addition to other pertinent events   9/19: Admitted to ICU. Intubated. CTX and Zithromax started. 9/20: Tolerated SBT, extubated.  Interim History / Subjective:  ON events: Hypertensive throughout the night- up to 215/131. Received  3 doses of Hydralazine  (10-20 mg) (10 pm, 2 am and 6:49 am) without much improvement. Weaned to room air at 0500 this morning maintaining SpO2 97%.  Labs:  Phosphorous 1.3 Magnesium 2.0 Na 140, K+ 3.7 Cr 1.68, BUN 34, GFR 45 WBC 9.5, Hgb 11.2  Glucoses well-controlled Blood cx w/ nowgrowth at 1 day  Objective   Blood pressure (!) 215/131, pulse 92, temperature 98.1 F (36.7 C), temperature source Oral, resp. rate (!) 23, height 5\' 9"  (1.753 m), weight 91.8 kg, SpO2 100 %.    Vent Mode: CPAP FiO2 (%):  [30 %] 30 % Set Rate:  [24 bmp] 24 bmp Vt Set:  [560 mL] 560 mL PEEP:  [5 cmH20] 5 cmH20 Pressure Support:  [5 cmH20] 5 cmH20 Plateau Pressure:  [17 cmH20] 17 cmH20   Intake/Output Summary (Last 24 hours) at 07/12/2022 0717 Last data filed at 07/12/2022 0615 Gross per  24 hour  Intake 1197.81 ml  Output 1850 ml  Net -652.19 ml   Filed Weights   07/10/22 0117 07/10/22 0340 07/11/22 0500  Weight: 95 kg 90.8 kg 91.8 kg    Examination: General: No acute distress, on room air HENT: MMM, sclera anicteric Lungs: Coarse lung sounds. Cardiovascular: RRR, no murmurs Abdomen: Soft, NTND, BS + Extremities: Warm, trace edema Neuro: Awake, alert, conversational  Resolved Hospital Problem list     Assessment & Plan:  Acute hypoxic/hypercapnic respiratory failure -resolved COPD exacerbation On room air and doing well with coarse breath sounds. - goal SpO2 88-92% - yupelri, brovana, pulmicort - PRN albuterol - Prednisone 40 mg daily (end date: 9/23)   Rt upper lobe community acquired pneumonia. Lactic acidosis - Continue CTX and Azithromycin - f/u blood/sputum culture, pneumococcal/legionella ag,    Hypertensive emergency from cocaine. Acute on chronic combined CHF. Hx of CAD, HLD. BP significantly elevated- pt asymptomatic. Given PRN Hydralazine overnight, and one dose of IV Labetolol 10 mg this morning. - PRN IV labetlol 10 mg for SBP >160 - Increase amlodipine to 10 mg daily, increase hydralazine to 100mg  TID, continue Imdur 120 mg daily - Add Coreg 12.5 mg BID - continue ASA, lipitor   DM type 2 poorly controlled with steroid induced hyperglycemia. Has not received any SSI in past 24 hours. CBGs well-controlled at 85, 91. - SSI qc/hs  Best Practice (right click and "Reselect all SmartList Selections" daily)   Diet/type: Regular consistency (see orders) DVT prophylaxis: LMWH GI prophylaxis: N/A Lines: N/A Foley:  N/A Code Status:  full code  Labs   CBC: Recent Labs  Lab 07/10/22 0122 07/10/22 0252 07/10/22 0429 07/10/22 1015 07/11/22 0337 07/11/22 0831  WBC 6.7  --   --   --   --  6.8  HGB 12.2* 11.6* 10.2* 10.2* 9.9* 10.1*  HCT 39.5 34.0* 30.0* 30.0* 29.0* 32.0*  MCV 91.0  --   --   --   --  87.9  PLT 166  --   --   --    --  117*    Basic Metabolic Panel: Recent Labs  Lab 07/10/22 0122 07/10/22 0252 07/10/22 0429 07/10/22 1015 07/11/22 0337 07/11/22 0831  NA 141 141 142 140 141 142  K 4.3 4.2 3.9 4.2 4.4 4.4  CL 110  --   --   --   --  110  CO2 21*  --   --   --   --  23  GLUCOSE 206*  --   --   --   --  124*  BUN 25*  --   --   --   --  37*  CREATININE 2.03*  --   --   --   --  2.24*  CALCIUM 8.8*  --   --   --   --  8.8*  MG 2.1  --   --   --   --  2.3  PHOS 6.7*  --   --   --   --  3.9   GFR: Estimated Creatinine Clearance: 36.3 mL/min (A) (by C-G formula based on SCr of 2.24 mg/dL (H)). Recent Labs  Lab 07/10/22 0122 07/10/22 1030 07/10/22 1236 07/11/22 0831  PROCALCITON 0.12  --   --   --   WBC 6.7  --   --  6.8  LATICACIDVEN 3.6* 2.4* 2.5*  --     Liver Function Tests: Recent Labs  Lab 07/10/22 0122  AST 68*  ALT 58*  ALKPHOS 97  BILITOT 0.2*  PROT 6.8  ALBUMIN 3.8   No results for input(s): "LIPASE", "AMYLASE" in the last 168 hours. No results for input(s): "AMMONIA" in the last 168 hours.  ABG    Component Value Date/Time   PHART 7.324 (L) 07/11/2022 0337   PCO2ART 45.7 07/11/2022 0337   PO2ART 70 (L) 07/11/2022 0337   HCO3 24.1 07/11/2022 0337   TCO2 26 07/11/2022 0337   ACIDBASEDEF 2.0 07/11/2022 0337   O2SAT 93 07/11/2022 0337     Coagulation Profile: Recent Labs  Lab 07/10/22 0122  INR 1.1    Cardiac Enzymes: No results for input(s): "CKTOTAL", "CKMB", "CKMBINDEX", "TROPONINI" in the last 168 hours.  HbA1C: Hgb A1c MFr Bld  Date/Time Value Ref Range Status  07/11/2022 08:31 AM 6.6 (H) 4.8 - 5.6 % Final    Comment:    (NOTE) Pre diabetes:          5.7%-6.4%  Diabetes:              >6.4%  Glycemic control for   <7.0% adults with diabetes   01/07/2022 02:25 PM 7.0 (H) 4.8 - 5.6 % Final    Comment:    (NOTE) Pre diabetes:          5.7%-6.4%  Diabetes:              >  6.4%  Glycemic control for   <7.0% adults with diabetes      CBG: Recent Labs  Lab 07/11/22 0327 07/11/22 0727 07/11/22 1113 07/11/22 1553 07/11/22 2250  GLUCAP 102* 93 126* 194* 85    Review of Systems:   No chest pain, visual changes, headaches, dysuria.  Past Medical History:  He,  has a past medical history of Arthritis, CHF (congestive heart failure) (HCC), Chronic bronchitis (HCC), Chronic kidney disease, COPD (chronic obstructive pulmonary disease) (HCC), Diabetes mellitus without complication (HCC), High cholesterol, and Hypertension.   Surgical History:   Past Surgical History:  Procedure Laterality Date   CORONARY ANGIOGRAPHY N/A 06/28/2020   Procedure: CORONARY ANGIOGRAPHY (CATH LAB);  Surgeon: Orpah Cobb, MD;  Location: [facility] INVASIVE CV LAB;  Service: Cardiovascular;  Laterality: N/A;   JOINT REPLACEMENT     KNEE CARTILAGE SURGERY Bilateral    MULTIPLE TOOTH EXTRACTIONS     TOTAL KNEE ARTHROPLASTY Left 12/24/2017   TOTAL KNEE ARTHROPLASTY Left 12/24/2017   Procedure: LEFT TOTAL KNEE ARTHROPLASTY;  Surgeon: Sheral Apley, MD;  Location: [facility] OR;  Service: Orthopedics;  Laterality: Left;   TOTAL KNEE ARTHROPLASTY Right 05/30/2021   Procedure: RIGHT TOTAL KNEE ARTHROPLASTY;  Surgeon: Sheral Apley, MD;  Location: [facility] ORS;  Service: Orthopedics;  Laterality: Right;     Social History:   reports that he has been smoking cigarettes. He has a 12.25 pack-year smoking history. He has never used smokeless tobacco. He reports that he does not currently use drugs after having used the following drugs: Cocaine. He reports that he does not drink alcohol.   Family History:  His family history is not on file.   Allergies No Known Allergies   Home Medications  Prior to Admission medications   Medication Sig Start Date End Date Taking? Authorizing Provider  acetaminophen (TYLENOL) 500 MG tablet Take 500-1,000 mg by mouth every 8 (eight) hours as needed for mild pain or headache.    [provider]  albuterol (VENTOLIN  HFA) 108 (90 Base) MCG/ACT inhaler Inhale 2-4 puffs into the lungs See admin instructions. Inhale 2-4 puffs into the lungs every four to six hours as needed for shortness of breath or wheezing 09/30/21   [provider]  amLODipine (NORVASC) 5 MG tablet Take 1 tablet (5 mg total) by mouth daily. 03/31/20   Orpah Cobb, MD  aspirin 81 MG chewable tablet Chew 1 tablet (81 mg total) by mouth 2 (two) times daily. To prevent blood clots after surgery 05/31/21   Julien Girt, PA-C  aspirin EC 81 MG tablet Take 81 mg by mouth daily. Swallow whole.    [provider]  atorvastatin (LIPITOR) 40 MG tablet Take 40 mg by mouth every evening. 05/25/21   [provider]  BAYER ASPIRIN EC LOW DOSE 81 MG EC tablet Take 81 mg by mouth daily. Swallow whole.    [provider]  Cholecalciferol (VITAMIN D3 PO) Take 1 capsule by mouth daily with breakfast.    [provider]  cloNIDine (CATAPRES) 0.2 MG tablet Take 1 tablet (0.2 mg total) by mouth 3 (three) times daily. 10/12/21   Tyrone Nine, MD  Cyanocobalamin (VITAMIN B-12 PO) Take 1 tablet by mouth daily with breakfast.    [provider]  docusate sodium (COLACE) 100 MG capsule 1 po BID while on narcotics  STOOL SOFTNER 05/31/21   Shepperson, Kirstin, PA-C  doxycycline (VIBRAMYCIN) 100 MG capsule Take 1 capsule (100 mg total) by mouth 2 (two)  times daily. 10/12/21   Tyrone Nine, MD  empagliflozin (JARDIANCE) 10 MG TABS tablet Take 1 tablet (10 mg total) by mouth daily. 10/12/21   Tyrone Nine, MD  hydrALAZINE (APRESOLINE) 100 MG tablet Take 1 tablet (100 mg total) by mouth 3 (three) times daily. 10/12/21   Tyrone Nine, MD  hydrALAZINE (APRESOLINE) 50 MG tablet Take 1 tablet (50 mg total) by mouth 3 (three) times daily. 06/29/20   Lanae Boast, MD  hydrocortisone cream 1 % Apply 1 application topically daily as needed for itching.    [provider]  isosorbide mononitrate (IMDUR) 120 MG 24 hr  tablet Take 1 tablet (120 mg total) by mouth daily. 10/12/21   Tyrone Nine, MD  linagliptin (TRADJENTA) 5 MG TABS tablet Take 1 tablet (5 mg total) by mouth daily. 02/07/21   Orpah Cobb, MD  Menthol, Topical Analgesic, (BIOFREEZE EX) Apply 1 application topically daily.    [provider]  metoprolol tartrate (LOPRESSOR) 25 MG tablet Take 1 tablet (25 mg total) by mouth 2 (two) times daily. 02/06/21   Orpah Cobb, MD  Omega-3 Fatty Acids (FISH OIL PO) Take 1 capsule by mouth daily.    [provider]  oxyCODONE (OXY IR/ROXICODONE) 5 MG immediate release tablet 1 tablet po q 4 hrs prn pain patient had total knee replacement on 05/31/21 05/31/21   Shepperson, Kirstin, PA-C  polyethylene glycol (MIRALAX / GLYCOLAX) 17 g packet 17grams in 6 oz of SOMETHING TO DRINGK twice a day until bowel movement.  LAXITIVE.  Restart if two days since last bowel movement 05/31/21   Shepperson, Kirstin, PA-C  predniSONE (DELTASONE) 20 MG tablet Take 2 tablets (40 mg total) by mouth daily before breakfast. 10/13/21   Tyrone Nine, MD  PROAIR HFA 108 [phone] Base) MCG/ACT inhaler Inhale 2 puffs into the lungs every 4 (four) hours as needed for shortness of breath. 12/22/20   Nyoka Cowden, MD  Pyridoxine HCl (VITAMIN B-6 PO) Take 1 tablet by mouth daily with breakfast.    [provider]  torsemide (DEMADEX) 20 MG tablet Take 20 mg by mouth See admin instructions. Take 20 mg by mouth on Monday and Thursday as needed for fluid 02/28/21   [provider]  VENTOLIN HFA 108 (90 Base) MCG/ACT inhaler Inhale 2 puffs into the lungs 4 (four) times daily as needed for wheezing or shortness of breath. 01/03/22   [provider]

## 2022-07-12 NOTE — Evaluation (Signed)
Physical Therapy Evaluation Patient Details Name: Phillip Kennedy MRN: [id number] DOB: Feb 13, 1956 Today's Date: 07/12/2022  History of Present Illness  pt is a 66 y/o male admitted from home by EMS to ED 9/19 with SOB.  Work up found Acute hypoxic/hypercapnic respiratory failure/COPD exacerbation, Right upper lobe CAP, HTN urgency from cocaine and chronic combined CHF.  PMHx:  Combined CHF, COPD, CKD 3b, DM type 2, HLD, HTN, Arthritis, CAD, Lymphedema, VDRF March 2023  Clinical Impression  Pt admitted with/for the complications above.  Pt presently generally weak and unsteady, needing min guard/supervision once OOB.  Pt also notably fatigued during the session, though SpO2 stayed acceptable on RA.  Pt currently limited functionally due to the problems listed below.  (see problems list.)  Pt will benefit from PT to maximize function and safety to be able to get home safely with available assist .        Recommendations for follow up therapy are one component of a multi-disciplinary discharge planning process, led by the attending physician.  Recommendations may be updated based on patient status, additional functional criteria and insurance authorization.  Follow Up Recommendations No PT follow up (If pt progresses as expect no f/u, HHPT if doesn't "bounce" back as expected)      Assistance Recommended at Discharge Set up Supervision/Assistance  Patient can return home with the following  A little help with walking and/or transfers;Assistance with cooking/housework    Equipment Recommendations None recommended by PT  Recommendations for Other Services       Functional Status Assessment Patient has had a recent decline in their functional status and demonstrates the ability to make significant improvements in function in a reasonable and predictable amount of time.     Precautions / Restrictions Precautions Precautions: Fall      Mobility  Bed Mobility Overal bed mobility: Modified  Independent                  Transfers Overall transfer level: Needs assistance   Transfers: Sit to/from Stand Sit to Stand: Supervision           General transfer comment: used UE's appropriately given general weakness    Ambulation/Gait Ambulation/Gait assistance: Min guard Gait Distance (Feet): 35 Feet Assistive device: None Gait Pattern/deviations: Step-through pattern       General Gait Details: weak, mildly unsteady, slow gait.  Short, guarded steps, but no assist needed.  Stairs            Wheelchair Mobility    Modified Rankin (Stroke Patients Only)       Balance Overall balance assessment: Mild deficits observed, not formally tested, Needs assistance Sitting-balance support: No upper extremity supported, Feet supported Sitting balance-Leahy Scale: Fair       Standing balance-Leahy Scale: Fair                               Pertinent Vitals/Pain Pain Assessment Pain Assessment: No/denies pain    Home Living Family/patient expects to be discharged to:: Private residence Living Arrangements: Spouse/significant other;Children Available Help at Discharge: Family Type of Home: House Home Access: Stairs to enter   Technical brewer of Steps: 1   Home Layout: One level Home Equipment: Conservation officer, nature (2 wheels);Cane - single point;BSC/3in1;Shower seat      Prior Function Prior Level of Function : Independent/Modified Independent             Mobility Comments: no use of  AD for mobility (used DME after surgeries temporarily in the past) ADLs Comments: Driving, Independent in ADLs, IADLs and active (yard work, Social research officer, government)     Journalist, newspaper   Dominant Hand: Right    Extremity/Trunk Assessment   Upper Extremity Assessment Upper Extremity Assessment: Generalized weakness;Overall [facility] for tasks assessed    Lower Extremity Assessment Lower Extremity Assessment:  (notable proximal/ Large muscle group weakness)     Cervical / Trunk Assessment Cervical / Trunk Assessment: Normal  Communication   Communication: No difficulties  Cognition Arousal/Alertness: Awake/alert Behavior During Therapy: WFL for tasks assessed/performed Overall Cognitive Status: Within Functional Limits for tasks assessed                                          General Comments General comments (skin integrity, edema, etc.): SpO2  97% on RA, down from 100% on 2L [place],  BP  mid 170's/ upper 110's,  R in the 80's    Exercises     Assessment/Plan    PT Assessment Patient needs continued PT services  PT Problem List Decreased strength;Decreased activity tolerance;Decreased balance;Decreased mobility;Decreased knowledge of use of DME;Cardiopulmonary status limiting activity       PT Treatment Interventions Gait training;Functional mobility training;Therapeutic activities;Balance training;Patient/family education    PT Goals (Current goals can be found in the Care Plan section)  Acute Rehab PT Goals Patient Stated Goal: get my breath back PT Goal Formulation: With patient Time For Goal Achievement: 07/26/22 Potential to Achieve Goals: Good    Frequency Min 3X/week     Co-evaluation               AM-PAC PT "6 Clicks" Mobility  Outcome Measure Help needed turning from your back to your side while in a flat bed without using bedrails?: None Help needed moving from lying on your back to sitting on the side of a flat bed without using bedrails?: None Help needed moving to and from a bed to a chair (including a wheelchair)?: A Little Help needed standing up from a chair using your arms (e.g., wheelchair or bedside chair)?: None Help needed to walk in hospital room?: A Little Help needed climbing 3-5 steps with a railing? : A Little 6 Click Score: 21    End of Session Equipment Utilized During Treatment: Oxygen Activity Tolerance: Patient tolerated treatment well;Patient limited by  fatigue Patient left: in bed;with call bell/phone within reach Nurse Communication: Mobility status PT Visit Diagnosis: Unsteadiness on feet (R26.81)    Time: 6063-0160 PT Time Calculation (min) (ACUTE ONLY): 13 min   Charges:   PT Evaluation $PT Eval Moderate Complexity: 1 Mod          07/12/2022  Ginger Carne., PT Acute Rehabilitation Services [phone]  (office)  Tessie Fass Dorinne Graeff 07/12/2022, 11:19 AM

## 2022-07-12 NOTE — Progress Notes (Signed)
eLink Physician-Brief Progress Note Patient Name: Phillip Kennedy DOB: [birth year] MRN: [id number]   Date of Service  07/12/2022  HPI/Events of Note  Patient with sub-optimal BP control on PRN iv Hydralazine 10 mg.  eICU Interventions  PRN iv Hydralazine changed to 10-20 mg Q 4 hours.        Kerry Kass [name] 07/12/2022, 6:03 AM

## 2022-07-13 DIAGNOSIS — E440 Moderate protein-calorie malnutrition: Secondary | ICD-10-CM

## 2022-07-13 DIAGNOSIS — I161 Hypertensive emergency: Secondary | ICD-10-CM

## 2022-07-13 LAB — GLUCOSE, CAPILLARY
Glucose-Capillary: 163 mg/dL — ABNORMAL HIGH (ref 70–99)
Glucose-Capillary: 94 mg/dL (ref 70–99)
Glucose-Capillary: 152 mg/dL — ABNORMAL HIGH (ref 70–99)
Glucose-Capillary: 107 mg/dL — ABNORMAL HIGH (ref 70–99)

## 2022-07-13 MED ORDER — CARVEDILOL 25 MG PO TABS
25.0000 mg | ORAL_TABLET | Freq: Two times a day (BID) | ORAL | Status: DC
  Administered 2022-07-13 – 2022-07-14 (×2): 25 mg via ORAL
  Filled 2022-07-13 (×2): qty 1

## 2022-07-13 MED ORDER — CLONIDINE HCL 0.2 MG PO TABS: 0.2000 mg | ORAL_TABLET | Freq: Three times a day (TID) | ORAL | Status: DC

## 2022-07-13 MED ORDER — HYDRALAZINE HCL 20 MG/ML IJ SOLN
10.0000 mg | INTRAMUSCULAR | Status: AC | PRN
  Administered 2022-07-13 – 2022-07-14 (×4): 20 mg via INTRAVENOUS
  Filled 2022-07-13 (×4): qty 1

## 2022-07-13 MED ORDER — SODIUM PHOSPHATES 45 MMOLE/15ML IV SOLN
30.0000 mmol | Freq: Once | INTRAVENOUS | Status: AC
  Administered 2022-07-13: 30 mmol via INTRAVENOUS
  Filled 2022-07-13: qty 10

## 2022-07-13 MED ORDER — CLONIDINE HCL 0.2 MG PO TABS
0.2000 mg | ORAL_TABLET | Freq: Three times a day (TID) | ORAL | Status: DC
  Administered 2022-07-13 – 2022-07-14 (×5): 0.2 mg via ORAL
  Filled 2022-07-13 (×5): qty 1

## 2022-07-13 MED ORDER — ADULT MULTIVITAMIN W/MINERALS CH
1.0000 | ORAL_TABLET | Freq: Every day | ORAL | Status: DC
  Administered 2022-07-13 – 2022-07-15 (×3): 1 via ORAL
  Filled 2022-07-13 (×3): qty 1

## 2022-07-13 MED ORDER — SPIRONOLACTONE 25 MG PO TABS
50.0000 mg | ORAL_TABLET | Freq: Every day | ORAL | Status: DC
  Administered 2022-07-13 – 2022-07-14 (×2): 50 mg via ORAL
  Filled 2022-07-13 (×2): qty 2

## 2022-07-13 MED ORDER — CLONIDINE HCL 0.1 MG PO TABS
0.1000 mg | ORAL_TABLET | Freq: Three times a day (TID) | ORAL | Status: DC
  Administered 2022-07-13: 0.1 mg via ORAL
  Filled 2022-07-13: qty 1

## 2022-07-13 MED ORDER — BOOST PLUS PO LIQD
237.0000 mL | Freq: Three times a day (TID) | ORAL | Status: DC
  Administered 2022-07-13 – 2022-07-15 (×3): 237 mL via ORAL
  Filled 2022-07-13 (×8): qty 237

## 2022-07-13 NOTE — Progress Notes (Addendum)
NAME:  Phillip Kennedy, MRN:  [id number], DOB:  02-11-1956, LOS: 3 ADMISSION DATE:  07/10/2022, CONSULTATION DATE:  07/10/2022 REFERRING MD:  Dr. ED, CHIEF COMPLAINT:  Respiratory failure  History of Present Illness:  66 yo male smoker brought from home by EMS to [facility] ER with shortness of breath.  On arrival to ER he was in respiratory distress and noted to have diffuse wheezing.  Blood pressure was 237/166 and started on NTG gtt in the ER.  He became lethargic and required intubation.  ABG showed severe acute respiratory acidosis.  Started on ABx for pneumonia, and therapy for COPD exacerbation.  UDS positive for cocaine.  PCCM asked to admit to ICU.  Hx from chart and medical team.    Extubated and doing well Extubated 9/20  Pertinent  Medical History  Combined CHF, COPD, CKD 3b, DM type 2, HLD, HTN, Arthritis, CAD, Lymphedema, VDRF March 2023  Significant Hospital Events: Including procedures, antibiotic start and stop dates in addition to other pertinent events   9/19 Admit 9/20-extubated  Interim History / Subjective:  Denies any shortness of breath Denies any pain or discomfort  Objective   Blood pressure (!) 151/102, pulse 70, temperature 97.7 F (36.5 C), temperature source Oral, resp. rate (!) 21, height 5\' 9"  (1.753 m), weight 91.8 kg, SpO2 97 %.        Intake/Output Summary (Last 24 hours) at 07/13/2022 1147 Last data filed at 07/13/2022 0700 Gross per 24 hour  Intake 290 ml  Output 1105 ml  Net -815 ml   Filed Weights   07/10/22 0117 07/10/22 0340 07/11/22 0500  Weight: 95 kg 90.8 kg 91.8 kg    Examination:  General -awake, alert, interactive ENT -moist oral mucosa Cardiac - regular rate/rhythm, no murmur Chest -fair air entry, decreased air movement at the bases Abdomen -soft, bowel sounds appreciated Extremities - lymphedema Skin - no rashes Neuro -awake alert interactive   Resolved Hospital Problem list     Assessment & Plan:   Acute  hypoxemic/hypercapnic respiratory failure -Successfully extubated and doing well currently -On room air  Chronic obstructive pulmonary disease with exacerbation -Continue bronchodilators  Currently acquired pneumonia -Right upper lobe infiltrate -Complete course of antibiotics  Hypertensive urgency Cocaine use -Continue antihypertensives  Type 2 diabetes -SSI  Metabolic encephalopathy  Pulmonary will sign off, call as needed  Best Practice (right click and "Reselect all SmartList Selections" daily)   Diet/type: Regular consistency (see orders) DVT prophylaxis: LMWH GI prophylaxis: PPI Lines: N/A Foley:  N/A Code Status:  full code Last date of multidisciplinary goals of care discussion [no family available at this time]  Labs   CBC: Recent Labs  Lab 07/10/22 0122 07/10/22 0252 07/10/22 0429 07/10/22 1015 07/11/22 0337 07/11/22 0831 07/12/22 0752  WBC 6.7  --   --   --   --  6.8 9.5  HGB 12.2*   < > 10.2* 10.2* 9.9* 10.1* 11.2*  HCT 39.5   < > 30.0* 30.0* 29.0* 32.0* 34.8*  MCV 91.0  --   --   --   --  87.9 85.3  PLT 166  --   --   --   --  117* 145*   < > = values in this interval not displayed.    Basic Metabolic Panel: Recent Labs  Lab 07/10/22 0122 07/10/22 0252 07/10/22 0429 07/10/22 1015 07/11/22 0337 07/11/22 0831 07/12/22 0752  NA 141   < > 142 140 141 142 140  K 4.3   < >  3.9 4.2 4.4 4.4 3.7  CL 110  --   --   --   --  110 108  CO2 21*  --   --   --   --  23 25  GLUCOSE 206*  --   --   --   --  124* 104*  BUN 25*  --   --   --   --  37* 34*  CREATININE 2.03*  --   --   --   --  2.24* 1.68*  CALCIUM 8.8*  --   --   --   --  8.8* 9.1  MG 2.1  --   --   --   --  2.3 2.0  PHOS 6.7*  --   --   --   --  3.9 1.3*   < > = values in this interval not displayed.   GFR: Estimated Creatinine Clearance: 48.4 mL/min (A) (by C-G formula based on SCr of 1.68 mg/dL (H)). Recent Labs  Lab 07/10/22 0122 07/10/22 1030 07/10/22 1236 07/11/22 0831  07/12/22 0752  PROCALCITON 0.12  --   --   --   --   WBC 6.7  --   --  6.8 9.5  LATICACIDVEN 3.6* 2.4* 2.5*  --   --     Liver Function Tests: Recent Labs  Lab 07/10/22 0122  AST 68*  ALT 58*  ALKPHOS 97  BILITOT 0.2*  PROT 6.8  ALBUMIN 3.8   No results for input(s): "LIPASE", "AMYLASE" in the last 168 hours. No results for input(s): "AMMONIA" in the last 168 hours.  ABG    Component Value Date/Time   PHART 7.324 (L) 07/11/2022 0337   PCO2ART 45.7 07/11/2022 0337   PO2ART 70 (L) 07/11/2022 0337   HCO3 24.1 07/11/2022 0337   TCO2 26 07/11/2022 0337   ACIDBASEDEF 2.0 07/11/2022 0337   O2SAT 93 07/11/2022 0337     Coagulation Profile: Recent Labs  Lab 07/10/22 0122  INR 1.1    Cardiac Enzymes: No results for input(s): "CKTOTAL", "CKMB", "CKMBINDEX", "TROPONINI" in the last 168 hours.  HbA1C: Hgb A1c MFr Bld  Date/Time Value Ref Range Status  07/11/2022 08:31 AM 6.6 (H) 4.8 - 5.6 % Final    Comment:    (NOTE) Pre diabetes:          5.7%-6.4%  Diabetes:              >6.4%  Glycemic control for   <7.0% adults with diabetes   01/07/2022 02:25 PM 7.0 (H) 4.8 - 5.6 % Final    Comment:    (NOTE) Pre diabetes:          5.7%-6.4%  Diabetes:              >6.4%  Glycemic control for   <7.0% adults with diabetes     CBG: Recent Labs  Lab 07/12/22 1106 07/12/22 1513 07/12/22 1744 07/12/22 2129 07/13/22 0810  GLUCAP 126* 124* 141* 140* 163*    Review of Systems:   Unable to obtain  Past Medical History:  He,  has a past medical history of Arthritis, CHF (congestive heart failure) (HCC), Chronic bronchitis ([place]), Chronic kidney disease, COPD (chronic obstructive pulmonary disease) ([place]), Diabetes mellitus without complication ([place]), High cholesterol, and Hypertension.   Surgical History:   Past Surgical History:  Procedure Laterality Date   CORONARY ANGIOGRAPHY N/A 06/28/2020   Procedure: CORONARY ANGIOGRAPHY (CATH LAB);  Surgeon: [name],  MD;  Location: [facility]  INVASIVE CV LAB;  Service: Cardiovascular;  Laterality: N/A;   JOINT REPLACEMENT     KNEE CARTILAGE SURGERY Bilateral    MULTIPLE TOOTH EXTRACTIONS     TOTAL KNEE ARTHROPLASTY Left 12/24/2017   TOTAL KNEE ARTHROPLASTY Left 12/24/2017   Procedure: LEFT TOTAL KNEE ARTHROPLASTY;  Surgeon: Renette Butters, MD;  Location: [place];  Service: Orthopedics;  Laterality: Left;   TOTAL KNEE ARTHROPLASTY Right 05/30/2021   Procedure: RIGHT TOTAL KNEE ARTHROPLASTY;  Surgeon: Renette Butters, MD;  Location: [facility];  Service: Orthopedics;  Laterality: Right;     Social History:   reports that he has been smoking cigarettes. He has a 12.25 pack-year smoking history. He has never used smokeless tobacco. He reports that he does not currently use drugs after having used the following drugs: Cocaine. He reports that he does not drink alcohol.   Family History:  His family history is not on file.   Allergies No Known Allergies   Sherrilyn Rist, MD [place] [facility] Pager: See Shea Evans

## 2022-07-13 NOTE — Progress Notes (Signed)
PROGRESS NOTE    Phillip Kennedy  KVQ:[id number] DOB: 27-Jun-1956 DOA: 07/10/2022 PCP: [name], MD    Chief Complaint  Patient presents with   Respiratory Distress    Brief Narrative:   66 yo male smoke past medical history of combined CHF, COPD, CKD 3b, DM type 2, HLD, HTN, Arthritis, CAD, Lymphedema, VDRF March 2023 brought from home by EMS to [facility] ER with shortness of breath.  On arrival to ER he was in respiratory distress and noted to have diffuse wheezing.  Blood pressure was 237/166 and started on NTG gtt in the ER.  He became lethargic and required intubation.  ABG showed severe acute respiratory acidosis.  Started on ABx for pneumonia, and therapy for COPD exacerbation.  UDS positive for cocaine.     9/19: Admitted to ICU. Intubated. CTX and Zithromax started. 9/20: Tolerated SBT, extubated. 06/2021 transferred to [facility] service    Assessment & Plan:   Principal Problem:   Acute hypoxemic respiratory failure ([place]) Active Problems:   Malnutrition of moderate degree   Acute hypoxic/hypercapnic respiratory failure  COPD exacerbation -Was intubated initially, successfully extubated -Tolerating room air currently -Encourage to  use incentive spirometer and flutter valve - goal SpO2 88-92% - yupelri, brovana, pulmicort - PRN albuterol - Prednisone 40 mg daily (end date: 9/23)   Rt upper lobe community acquired pneumonia. Lactic acidosis - Continue CTX and Azithromycin - f/u blood/sputum culture, pneumococcal/legionella ag,    Hypertensive emergency from cocaine. Acute on chronic combined CHF. Hx of CAD, HLD. -He did require Imdur drip initially, he was transitioned to oral regimen and referred out of ICU 9/21. -Blood pressure remains significantly uncontrolled on amlodipine 10 mg oral daily, hydralazine 100 mg 3 times daily, Imdur 120 mg daily, Coreg 12.5 mg p.o. daily. -Continue with as needed IV hydralazine and labetalol-we will increase his Coreg to 25 mg  p.o. daily, and resume home dose clonidine, will start on spironolactone as well. - continue ASA, lipitor   DM type 2 poorly controlled with steroid induced hyperglycemia. Has not received any SSI in past 24 hours. CBGs well-controlled at 70, 91. - SSI qc/hs  Hypophosphatemia -Repleting, recheck in a.m  CKD stage IIIb -Continue to monitor      DVT prophylaxis: Lovenox Code Status: Full Family Communication: None at bedside Disposition:   Status is: Inpatient    Consultants:  PCCM  Subjective: Denies any chest pain today, he does report generalized weakness and fatigue, some mild intermittent headache  Objective: Vitals:   07/13/22 0734 07/13/22 0750 07/13/22 0902 07/13/22 1026  BP: (!) 200/128 (!) 199/128 (!) 181/109 (!) 192/118  Pulse: 84 77    Resp: (!) 21 (!) 21    Temp:  97.7 F (36.5 C)    TempSrc:  Oral    SpO2: 100% 96%    Weight:      Height:        Intake/Output Summary (Last 24 hours) at 07/13/2022 1106 Last data filed at 07/13/2022 0700 Gross per 24 hour  Intake 290 ml  Output 1105 ml  Net -815 ml   Filed Weights   07/10/22 0117 07/10/22 0340 07/11/22 0500  Weight: 95 kg 90.8 kg 91.8 kg    Examination:  Awake Alert, Oriented X 3, No new F.N deficits, Normal affect Symmetrical Chest wall movement, Good air movement bilaterally, CTAB RRR,No Gallops,Rubs or new Murmurs, No Parasternal Heave +ve B.Sounds, Abd Soft, No tenderness, No rebound - guarding or rigidity. No Cyanosis, Clubbing or edema,  No new Rash or bruise       Data Reviewed: I have personally reviewed following labs and imaging studies  CBC: Recent Labs  Lab 07/10/22 0122 07/10/22 0252 07/10/22 0429 07/10/22 1015 07/11/22 0337 07/11/22 0831 07/12/22 0752  WBC 6.7  --   --   --   --  6.8 9.5  HGB 12.2*   < > 10.2* 10.2* 9.9* 10.1* 11.2*  HCT 39.5   < > 30.0* 30.0* 29.0* 32.0* 34.8*  MCV 91.0  --   --   --   --  87.9 85.3  PLT 166  --   --   --   --  117* 145*   < > =  values in this interval not displayed.    Basic Metabolic Panel: Recent Labs  Lab 07/10/22 0122 07/10/22 0252 07/10/22 0429 07/10/22 1015 07/11/22 0337 07/11/22 0831 07/12/22 0752  NA 141   < > 142 140 141 142 140  K 4.3   < > 3.9 4.2 4.4 4.4 3.7  CL 110  --   --   --   --  110 108  CO2 21*  --   --   --   --  23 25  GLUCOSE 206*  --   --   --   --  124* 104*  BUN 25*  --   --   --   --  37* 34*  CREATININE 2.03*  --   --   --   --  2.24* 1.68*  CALCIUM 8.8*  --   --   --   --  8.8* 9.1  MG 2.1  --   --   --   --  2.3 2.0  PHOS 6.7*  --   --   --   --  3.9 1.3*   < > = values in this interval not displayed.    GFR: Estimated Creatinine Clearance: 48.4 mL/min (A) (by C-G formula based on SCr of 1.68 mg/dL (H)).  Liver Function Tests: Recent Labs  Lab 07/10/22 0122  AST 68*  ALT 58*  ALKPHOS 97  BILITOT 0.2*  PROT 6.8  ALBUMIN 3.8    CBG: Recent Labs  Lab 07/12/22 1106 07/12/22 1513 07/12/22 1744 07/12/22 2129 07/13/22 0810  GLUCAP 126* 124* 141* 140* 163*     Recent Results (from the past 240 hour(s))  Culture, Respiratory w Gram Stain     Status: None (Preliminary result)   Collection Time: 07/10/22  1:04 AM   Specimen: Tracheal Aspirate; Respiratory  Result Value Ref Range Status   Specimen Description TRACHEAL ASPIRATE  Final   Special Requests NONE  Final   Gram Stain   Final    MODERATE WBC PRESENT, PREDOMINANTLY PMN FEW GRAM NEGATIVE RODS FEW GRAM POSITIVE COCCI IN CLUSTERS RARE YEAST    Culture   Final    CULTURE REINCUBATED FOR BETTER GROWTH Performed at [facility], [place] [address] [place], [place] [address]    Report Status PENDING  Incomplete  Culture, blood (Routine X 2) w Reflex to ID Panel     Status: None (Preliminary result)   Collection Time: 07/10/22  1:22 AM   Specimen: BLOOD RIGHT FOREARM  Result Value Ref Range Status   Specimen Description BLOOD RIGHT FOREARM  Final   Special Requests   Final    BOTTLES DRAWN  AEROBIC AND ANAEROBIC Blood Culture adequate volume   Culture   Final    NO GROWTH 2  DAYS Performed at [facility], [place] [address] [place], [place] [address]    Report Status PENDING  Incomplete  Urine Culture     Status: None   Collection Time: 07/10/22  1:55 AM   Specimen: Urine, Catheterized  Result Value Ref Range Status   Specimen Description URINE, CATHETERIZED  Final   Special Requests NONE  Final   Culture   Final    NO GROWTH Performed at [facility], [address] [place], [place] [address]    Report Status 07/11/2022 FINAL  Final  Culture, blood (Routine X 2) w Reflex to ID Panel     Status: None (Preliminary result)   Collection Time: 07/10/22  2:20 AM   Specimen: BLOOD LEFT FOREARM  Result Value Ref Range Status   Specimen Description BLOOD LEFT FOREARM  Final   Special Requests   Final    BOTTLES DRAWN AEROBIC AND ANAEROBIC Blood Culture adequate volume   Culture   Final    NO GROWTH 2 DAYS Performed at [facility], [place] [address] [place], [place] [address]    Report Status PENDING  Incomplete  MRSA Next Gen by PCR, Nasal     Status: None   Collection Time: 07/10/22  4:01 AM  Result Value Ref Range Status   MRSA by PCR Next Gen NOT DETECTED NOT DETECTED Final    Comment: (NOTE) The GeneXpert MRSA Assay (FDA approved for NASAL specimens only), is one component of a comprehensive MRSA colonization surveillance program. It is not intended to diagnose MRSA infection nor to guide or monitor treatment for MRSA infections. Test performance is not FDA approved in patients less than 17 years old. Performed at [facility], [place] [address], [place], [place] [address]          Radiology Studies: No results found.      Scheduled Meds:  amLODipine  10 mg Oral Daily   arformoterol  15 mcg Nebulization BID   aspirin  81 mg Oral Daily   atorvastatin  40 mg Oral Daily   budesonide (PULMICORT) nebulizer solution  0.5 mg  Nebulization BID   carvedilol  25 mg Oral BID WC   cloNIDine  0.2 mg Oral TID   docusate sodium  100 mg Oral BID   enoxaparin (LOVENOX) injection  40 mg Subcutaneous Daily   hydrALAZINE  100 mg Oral TID   insulin aspart  0-20 Units Subcutaneous TID AC & HS   isosorbide mononitrate  120 mg Oral Daily   polyethylene glycol  17 g Oral Daily   predniSONE  40 mg Oral Q breakfast   revefenacin  175 mcg Nebulization Daily   sodium chloride flush  3 mL Intravenous Q12H   spironolactone  50 mg Oral Daily   Continuous Infusions:  sodium chloride Stopped (07/12/22 0102)   cefTRIAXone (ROCEPHIN)  IV 2 g (07/12/22 2145)     LOS: 3 days      Phillips Climes, MD [facility]   To contact the attending provider between 7A-7P or the covering provider during after hours 7P-7A, please log into the web site [email or web address] and access using universal [place] password for that web site. If you do not have the password, please call the hospital operator.  07/13/2022, 11:06 AM

## 2022-07-13 NOTE — Progress Notes (Signed)
Mobility Specialist Progress Note:   07/13/22 1316  Mobility  Activity Refused mobility   Pt refused mobility d/t wanting to take a nap. Asked to be seen later today. Will f/u as able.    Phillip Kennedy Mobility Specialist-Acute Rehab Secure Chat only

## 2022-07-13 NOTE — Progress Notes (Signed)
Nutrition Follow-up  DOCUMENTATION CODES:   Non-severe (moderate) malnutrition in context of chronic illness  INTERVENTION:   Encourage good PO intake Boost Plus po TID, each supplement provides 360 kcal and 14 grams of protein Multivitamin w/ minerals daily  NUTRITION DIAGNOSIS:   Moderate Malnutrition related to chronic illness (COPD) as evidenced by moderate fat depletion, moderate muscle depletion. - Ongoing   GOAL:   Patient will meet greater than or equal to 90% of their needs - Ongoing   MONITOR:   PO intake, Supplement acceptance, Labs, I & O's, Weight trends  REASON FOR ASSESSMENT:   Consult Enteral/tube feeding initiation and management  ASSESSMENT:   Pt with PMH of CHF, COPD, CKD stage 3, DM, HLD, HTN, arthritis, CAD, lymphedema, VDRF 12/2021 now admitted from home with COPD exacerbation, PNA, and positive for cocaine.  9/20 - extubated  9/21 - transferred to the floor  Pt resting in bed. Reports that he has not had much of an appetite. States that he ate cereal and juice this morning. Denies any nausea or vomiting. Discussed using nutritional supplements until appetite improves, pt agreeable. Pt with no other needs at this time.   Medications reviewed and include: Colace, NovoLog, Miralax, Prednisone, Spironolactone, IV antibiotics  Labs reviewed.   UOP: 1740 mL x 24 hours  Diet Order:   Diet Order             Diet regular Room service appropriate? Yes; Fluid consistency: Thin  Diet effective now                   EDUCATION NEEDS:   No education needs have been identified at this time  Skin:  Skin Assessment: Reviewed RN Assessment  Last BM:  PTA  Height:   Ht Readings from Last 1 Encounters:  07/10/22 5\' 9"  (1.753 m)    Weight:   Wt Readings from Last 1 Encounters:  07/11/22 91.8 kg    BMI:  Body mass index is 29.89 kg/m.  Estimated Nutritional Needs:   Kcal:  2200-2400  Protein:  110-125 grams  Fluid:  >2  L/day   Hermina Barters RD, LDN Clinical Dietitian See [facility] for contact information.

## 2022-07-13 NOTE — Progress Notes (Signed)
eLink Physician-Brief Progress Note Patient Name: Seif Teichert DOB: 1956/04/11 MRN: [id number]   Date of Service  07/13/2022  HPI/Events of Note  Patient with sub-optimal blood pressure control despite scheduled oral medications and PRN iv Labetalol.  eICU Interventions  PRN iv Hydralazine order added.        Kerry Kass Yocelin Vanlue 07/13/2022, 1:02 AM

## 2022-07-13 NOTE — Progress Notes (Signed)
PT Cancellation Note  Patient Details Name: Phillip Kennedy MRN: [id number] DOB: [birth year]   Cancelled Treatment:    Reason Eval/Treat Not Completed: Patient declined, no reason specified.  Pt deferred until mobility team due to too worn out. 07/13/2022  Ginger Carne., PT Acute Rehabilitation Services [phone]  (office)   Tessie Fass Latesa Fratto 07/13/2022, 4:53 PM

## 2022-07-13 NOTE — Care Management Important Message (Signed)
Important Message  Patient Details  Name: Phillip Kennedy MRN: [id number] Date of Birth: 14-Mar-1956   Medicare Important Message Given:  Yes     Hannah Beat 07/13/2022, 1:33 PM

## 2022-07-13 NOTE — Progress Notes (Signed)
Prn BP meds not effective

## 2022-07-13 NOTE — TOC Initial Note (Signed)
Transition of Care [facility]) - Initial/Assessment Note    Patient Details  Name: Phillip Kennedy MRN: [id number] Date of Birth: 1956-07-02  Transition of [facility]) CM/SW Contact:    Verdell Carmine, RN Phone Number: 07/13/2022, 9:41 AM  Clinical Narrative:                  Transitions of care has reviewed the patient and found no needs at this time. The patient will be monitored daily via progression rounds. If a need is identified, please place a TOC consult.       Patient Goals and CMS Choice        Expected Discharge Plan and Services                                                Prior Living Arrangements/Services                       Activities of Daily Living      Permission Sought/Granted                  Emotional Assessment              Admission diagnosis:  Acute hypoxemic respiratory failure ([place]) [J96.01] Patient Active Problem List   Diagnosis Date Noted   Malnutrition of moderate degree 07/11/2022   Acute hypoxemic respiratory failure ([place]) 07/10/2022   CAP (community acquired pneumonia) 01/09/2022   AKI (acute kidney injury) ([place]) 01/09/2022   Stage 3b chronic kidney disease (CKD) ([place]) 01/09/2022   DM2 (diabetes mellitus, type 2) ([place]) 01/09/2022   Chronic acquired lymphedema 01/09/2022   Benign essential HTN 01/09/2022   Acute on chronic systolic CHF (congestive heart failure) ([place]) 01/09/2022   Respiratory failure ([place]) 01/07/2022   Acute respiratory failure with hypoxia ([place]) 10/09/2021   Hypertensive emergency    AKI (acute kidney injury) ([place])    Hyperglycemia    Pulmonary edema    Primary localized osteoarthritis of right knee 05/30/2021   Cellulitis of right lower leg 02/01/2021   COPD  GOLD 0  / still smoking 08/30/2020   DOE (dyspnea on exertion) 08/30/2020   Cigarette smoker 08/30/2020   Acute pulmonary edema ([place]) 06/23/2020   Acute respiratory failure with hypoxia ([place])    Hypertensive  emergency    Cellulitis of right leg 03/27/2020   Tobacco use disorder 12/26/2017   Primary osteoarthritis of knee 12/24/2017   Hypertension 11/26/2017   High cholesterol 11/26/2017   Primary osteoarthritis of left knee 11/26/2017   Osteoarthritis of right knee 09/16/2017   PCP:  [name], MD Pharmacy:   [facility] [place], [place] [address] [name] [address] Phone: [phone] Fax: [phone]  [facility] [address] [name] [address] Phone: [phone] Fax: [phone]     Social Determinants of Health (SDOH) Interventions    Readmission Risk Interventions     No data to display

## 2022-07-13 NOTE — Progress Notes (Signed)
Mobility Specialist Progress Note:   07/13/22 1448  Mobility  Activity Ambulated with assistance in hallway  Level of Assistance Contact guard assist, steadying assist  Assistive Device Other (Comment) (IV Pole)  Distance Ambulated (ft) 500 ft  Activity Response Tolerated well  $Mobility charge 1 Mobility   Pt received in bed and eager. C/o unsteady feeling with ambulation. Pt left sitting EOB with RN in room. Call bell in reach and all needs met.   Evaan Tidwell Mobility Specialist-Acute Rehab Secure Chat only

## 2022-07-14 DIAGNOSIS — F1490 Cocaine use, unspecified, uncomplicated: Secondary | ICD-10-CM

## 2022-07-14 LAB — GLUCOSE, CAPILLARY
Glucose-Capillary: 127 mg/dL — ABNORMAL HIGH (ref 70–99)
Glucose-Capillary: 113 mg/dL — ABNORMAL HIGH (ref 70–99)
Glucose-Capillary: 172 mg/dL — ABNORMAL HIGH (ref 70–99)
Glucose-Capillary: 157 mg/dL — ABNORMAL HIGH (ref 70–99)

## 2022-07-14 LAB — CBC
HCT: 36.4 % — ABNORMAL LOW (ref 39.0–52.0)
Hemoglobin: 12.1 g/dL — ABNORMAL LOW (ref 13.0–17.0)
MCH: 27.9 pg (ref 26.0–34.0)
MCHC: 33.2 g/dL (ref 30.0–36.0)
MCV: 84.1 fL (ref 80.0–100.0)
Platelets: 163 10*3/uL (ref 150–400)
RBC: 4.33 MIL/uL (ref 4.22–5.81)
RDW: 13.3 % (ref 11.5–15.5)
WBC: 7.5 10*3/uL (ref 4.0–10.5)
nRBC: 0 % (ref 0.0–0.2)

## 2022-07-14 LAB — BASIC METABOLIC PANEL
Anion gap: 7 (ref 5–15)
BUN: 35 mg/dL — ABNORMAL HIGH (ref 8–23)
CO2: 21 mmol/L — ABNORMAL LOW (ref 22–32)
Calcium: 8.4 mg/dL — ABNORMAL LOW (ref 8.9–10.3)
Chloride: 108 mmol/L (ref 98–111)
Creatinine, Ser: 1.53 mg/dL — ABNORMAL HIGH (ref 0.61–1.24)
GFR, Estimated: 50 mL/min — ABNORMAL LOW (ref >60)
Glucose, Bld: 143 mg/dL — ABNORMAL HIGH (ref 70–99)
Potassium: 3.8 mmol/L (ref 3.5–5.1)
Sodium: 136 mmol/L (ref 135–145)

## 2022-07-14 LAB — PHOSPHORUS: Phosphorus: 3.1 mg/dL (ref 2.5–4.6)

## 2022-07-14 LAB — CULTURE, RESPIRATORY W GRAM STAIN: Culture: NORMAL

## 2022-07-14 LAB — MAGNESIUM: Magnesium: 2.1 mg/dL (ref 1.7–2.4)

## 2022-07-14 MED ORDER — ONDANSETRON HCL 4 MG/2ML IJ SOLN
4.0000 mg | Freq: Four times a day (QID) | INTRAMUSCULAR | Status: DC | PRN
  Filled 2022-07-14: qty 2

## 2022-07-14 MED ORDER — CARVEDILOL 12.5 MG PO TABS
12.5000 mg | ORAL_TABLET | Freq: Two times a day (BID) | ORAL | Status: DC
  Administered 2022-07-15: 12.5 mg via ORAL
  Filled 2022-07-14 (×2): qty 1

## 2022-07-14 MED ORDER — CARVEDILOL 12.5 MG PO TABS
12.5000 mg | ORAL_TABLET | Freq: Once | ORAL | Status: AC
  Administered 2022-07-14: 12.5 mg via ORAL

## 2022-07-14 NOTE — Progress Notes (Addendum)
PROGRESS NOTE    Phillip Kennedy  PXT:[id number] DOB: February 13, 1956 DOA: 07/10/2022 PCP: Orpah Cobb, MD    Chief Complaint  Patient presents with   Respiratory Distress    Brief Narrative:   66 yo male smoke past medical history of combined CHF, COPD, CKD 3b, DM type 2, HLD, HTN, Arthritis, CAD, Lymphedema, VDRF March 2023 brought from home by EMS to [facility] ER with shortness of breath.  On arrival to ER he was in respiratory distress and noted to have diffuse wheezing.  Blood pressure was 237/166 and started on NTG gtt in the ER.  He became lethargic and required intubation.  ABG showed severe acute respiratory acidosis.  Started on ABx for pneumonia, and therapy for COPD exacerbation.  UDS positive for cocaine.     9/19: Admitted to ICU. Intubated. CTX and Zithromax started. 9/20: Tolerated SBT, extubated. 06/2021 transferred to [facility]    Assessment & Plan:   Principal Problem:   Acute hypoxemic respiratory failure (HCC) Active Problems:   CAP (community acquired pneumonia)   Hypertension   Hypertensive emergency   Malnutrition of moderate degree   Cocaine use   Acute hypoxic/hypercapnic respiratory failure  COPD exacerbation -Was intubated initially, successfully extubated -Tolerating room air currently -Encourage to  use incentive spirometer and flutter valve - goal SpO2 88-92% - yupelri, brovana, pulmicort - PRN albuterol - Prednisone 40 mg daily (end date: 9/23)   Rt upper lobe community acquired pneumonia. Lactic acidosis - Continue CTX and Azithromycin - f/u blood/sputum culture, pneumococcal/legionella ag,    Hypertensive emergency from cocaine. Acute on chronic combined CHF. Hx of CAD, HLD. -He did require Imdur drip initially, he was transitioned to oral regimen and referred out of ICU 9/21. -Elevated blood pressure initially related  to cocaine use and medication noncompliance. -Blood pressure has improved after increasing his Coreg, clonidine, and  starting home on Aldactone, meanwhile he was on maximized dose of amlodipine, hydralazine and Imdur.   - continue ASA, lipitor   DM type 2 poorly controlled with steroid induced hyperglycemia. Has not received any SSI in past 24 hours. CBGs well-controlled at 85, 91. - SSI qc/hs  Hypophosphatemia -Repleted  CKD stage IIIb -Continue to monitor  Cocaine use -He was counseled   Medication noncompliance -he was counseled    DVT prophylaxis: Lovenox Code Status: Full Family Communication: None at bedside Disposition:   Status is: Inpatient    Consultants:  PCCM  Subjective:  Has any chest pain or shortness of breath today, ambulated in the hallway with staff yesterday, he did report some mild nausea, but no vomiting, no abdominal pain.  Objective: Vitals:   07/14/22 0800 07/14/22 0900 07/14/22 0922 07/14/22 0923  BP: (!) 179/108 (!) 169/111    Pulse: 60 (!) 57    Resp: 12 16    Temp: 98.9 F (37.2 C)     TempSrc: Oral     SpO2: 96% 96% 97% 97%  Weight:      Height:        Intake/Output Summary (Last 24 hours) at 07/14/2022 1130 Last data filed at 07/14/2022 0700 Gross per 24 hour  Intake 127.96 ml  Output 1125 ml  Net -997.04 ml   Filed Weights   07/10/22 0340 07/11/22 0500 07/14/22 0402  Weight: 90.8 kg 91.8 kg 88.4 kg    Examination:  Awake Alert, Oriented X 3, No new F.N deficits, Normal affect Symmetrical Chest wall movement, Good air movement bilaterally, CTAB RRR,No Gallops,Rubs or new Murmurs,  No Parasternal Heave +ve B.Sounds, Abd Soft, No tenderness, No rebound - guarding or rigidity. No Cyanosis, Clubbing or edema, No new Rash or bruise        Data Reviewed: I have personally reviewed following labs and imaging studies  CBC: Recent Labs  Lab 07/10/22 0122 07/10/22 0252 07/10/22 1015 07/11/22 0337 07/11/22 0831 07/12/22 0752 07/14/22 0712  WBC 6.7  --   --   --  6.8 9.5 7.5  HGB 12.2*   < > 10.2* 9.9* 10.1* 11.2* 12.1*  HCT  39.5   < > 30.0* 29.0* 32.0* 34.8* 36.4*  MCV 91.0  --   --   --  87.9 85.3 84.1  PLT 166  --   --   --  117* 145* 163   < > = values in this interval not displayed.    Basic Metabolic Panel: Recent Labs  Lab 07/10/22 0122 07/10/22 0252 07/10/22 1015 07/11/22 0337 07/11/22 0831 07/12/22 0752 07/14/22 0712  NA 141   < > 140 141 142 140 136  K 4.3   < > 4.2 4.4 4.4 3.7 3.8  CL 110  --   --   --  110 108 108  CO2 21*  --   --   --  23 25 21*  GLUCOSE 206*  --   --   --  124* 104* 143*  BUN 25*  --   --   --  37* 34* 35*  CREATININE 2.03*  --   --   --  2.24* 1.68* 1.53*  CALCIUM 8.8*  --   --   --  8.8* 9.1 8.4*  MG 2.1  --   --   --  2.3 2.0 2.1  PHOS 6.7*  --   --   --  3.9 1.3* 3.1   < > = values in this interval not displayed.    GFR: Estimated Creatinine Clearance: 52.3 mL/min (A) (by C-G formula based on SCr of 1.53 mg/dL (H)).  Liver Function Tests: Recent Labs  Lab 07/10/22 0122  AST 68*  ALT 58*  ALKPHOS 97  BILITOT 0.2*  PROT 6.8  ALBUMIN 3.8    CBG: Recent Labs  Lab 07/13/22 0810 07/13/22 1209 07/13/22 1642 07/13/22 2112 07/14/22 0811  GLUCAP 163* 94 152* 107* 127*     Recent Results (from the past 240 hour(s))  Culture, Respiratory w Gram Stain     Status: None (Preliminary result)   Collection Time: 07/10/22  1:04 AM   Specimen: Tracheal Aspirate; Respiratory  Result Value Ref Range Status   Specimen Description TRACHEAL ASPIRATE  Final   Special Requests NONE  Final   Gram Stain   Final    MODERATE WBC PRESENT, PREDOMINANTLY PMN FEW GRAM NEGATIVE RODS FEW GRAM POSITIVE COCCI IN CLUSTERS RARE YEAST    Culture   Final    CULTURE REINCUBATED FOR BETTER GROWTH Performed at [facility], [place] [address] [place], [place] [address]    Report Status PENDING  Incomplete  Culture, blood (Routine X 2) w Reflex to ID Panel     Status: None (Preliminary result)   Collection Time: 07/10/22  1:22 AM   Specimen: BLOOD RIGHT FOREARM  Result  Value Ref Range Status   Specimen Description BLOOD RIGHT FOREARM  Final   Special Requests   Final    BOTTLES DRAWN AEROBIC AND ANAEROBIC Blood Culture adequate volume   Culture   Final    NO GROWTH 4 DAYS  Performed at [facility], [place] [address] [place], [place] [address]    Report Status PENDING  Incomplete  Urine Culture     Status: None   Collection Time: 07/10/22  1:55 AM   Specimen: Urine, Catheterized  Result Value Ref Range Status   Specimen Description URINE, CATHETERIZED  Final   Special Requests NONE  Final   Culture   Final    NO GROWTH Performed at [facility], [address], [place], [place] [address]    Report Status 07/11/2022 FINAL  Final  Culture, blood (Routine X 2) w Reflex to ID Panel     Status: None (Preliminary result)   Collection Time: 07/10/22  2:20 AM   Specimen: BLOOD LEFT FOREARM  Result Value Ref Range Status   Specimen Description BLOOD LEFT FOREARM  Final   Special Requests   Final    BOTTLES DRAWN AEROBIC AND ANAEROBIC Blood Culture adequate volume   Culture   Final    NO GROWTH 4 DAYS Performed at [facility], [place] [address], [place], [place] [address]    Report Status PENDING  Incomplete  MRSA Next Gen by PCR, Nasal     Status: None   Collection Time: 07/10/22  4:01 AM  Result Value Ref Range Status   MRSA by PCR Next Gen NOT DETECTED NOT DETECTED Final    Comment: (NOTE) The GeneXpert MRSA Assay (FDA approved for NASAL specimens only), is one component of a comprehensive MRSA colonization surveillance program. It is not intended to diagnose MRSA infection nor to guide or monitor treatment for MRSA infections. Test performance is not FDA approved in patients less than 47 years old. Performed at [facility], [place] [address] [place],  [address]          Radiology Studies: No results found.      Scheduled Meds:  amLODipine  10 mg Oral Daily   arformoterol  15 mcg Nebulization BID    aspirin  81 mg Oral Daily   atorvastatin  40 mg Oral Daily   budesonide (PULMICORT) nebulizer solution  0.5 mg Nebulization BID   carvedilol  25 mg Oral BID WC   cloNIDine  0.2 mg Oral TID   docusate sodium  100 mg Oral BID   enoxaparin (LOVENOX) injection  40 mg Subcutaneous Daily   hydrALAZINE  100 mg Oral TID   insulin aspart  0-20 Units Subcutaneous TID AC & HS   isosorbide mononitrate  120 mg Oral Daily   lactose free nutrition  237 mL Oral TID WC   multivitamin with minerals  1 tablet Oral Daily   polyethylene glycol  17 g Oral Daily   revefenacin  175 mcg Nebulization Daily   sodium chloride flush  3 mL Intravenous Q12H   spironolactone  50 mg Oral Daily   Continuous Infusions:  sodium chloride Stopped (07/12/22 0102)   cefTRIAXone (ROCEPHIN)  IV 2 g (07/13/22 2128)     LOS: 4 days      Phillips Climes, MD [facility]   To contact the attending provider between 7A-7P or the covering provider during after hours 7P-7A, please log into the web site [email or web address] and access using universal [place] password for that web site. If you do not have the password, please call the hospital operator.  07/14/2022, 11:30 AM

## 2022-07-15 LAB — GLUCOSE, CAPILLARY
Glucose-Capillary: 330 mg/dL — ABNORMAL HIGH (ref 70–99)
Glucose-Capillary: 78 mg/dL (ref 70–99)

## 2022-07-15 LAB — CULTURE, BLOOD (ROUTINE X 2)
Culture: NO GROWTH
Culture: NO GROWTH
Special Requests: ADEQUATE
Special Requests: ADEQUATE

## 2022-07-15 MED ORDER — HYDRALAZINE HCL 100 MG PO TABS: 100.0000 mg | ORAL_TABLET | Freq: Three times a day (TID) | ORAL | 0 refills | Status: DC

## 2022-07-15 MED ORDER — CLONIDINE HCL 0.1 MG PO TABS
0.3000 mg | ORAL_TABLET | Freq: Three times a day (TID) | ORAL | Status: DC
  Administered 2022-07-15: 0.3 mg via ORAL
  Filled 2022-07-15: qty 3

## 2022-07-15 MED ORDER — AMLODIPINE BESYLATE 10 MG PO TABS: 10.0000 mg | ORAL_TABLET | Freq: Every day | ORAL | 0 refills | Status: DC

## 2022-07-15 MED ORDER — LOSARTAN POTASSIUM 50 MG PO TABS
50.0000 mg | ORAL_TABLET | Freq: Every day | ORAL | Status: DC
  Administered 2022-07-15: 50 mg via ORAL
  Filled 2022-07-15: qty 1

## 2022-07-15 MED ORDER — LOSARTAN POTASSIUM 50 MG PO TABS: 50.0000 mg | ORAL_TABLET | Freq: Every day | ORAL | 0 refills | Status: DC

## 2022-07-15 MED ORDER — ADULT MULTIVITAMIN W/MINERALS CH: 1.0000 | ORAL_TABLET | Freq: Every day | ORAL | Status: DC

## 2022-07-15 MED ORDER — BOOST PLUS PO LIQD: 237.0000 mL | Freq: Three times a day (TID) | ORAL | 0 refills | Status: DC

## 2022-07-15 MED ORDER — CLONIDINE HCL 0.3 MG PO TABS: 0.3000 mg | ORAL_TABLET | Freq: Three times a day (TID) | ORAL | 0 refills | Status: DC

## 2022-07-15 MED ORDER — CARVEDILOL 12.5 MG PO TABS: 12.5000 mg | ORAL_TABLET | Freq: Two times a day (BID) | ORAL | 0 refills | Status: AC

## 2022-07-15 MED ORDER — ISOSORBIDE MONONITRATE ER 120 MG PO TB24: 120.0000 mg | ORAL_TABLET | Freq: Every day | ORAL | 0 refills | Status: DC

## 2022-07-15 NOTE — Discharge Instructions (Signed)
Follow with Primary MD Phillip Dials, MD in 7 days   Get CBC, CMP, checked  by Primary MD next visit.    Activity: As tolerated with Full fall precautions use walker/cane & assistance as needed   Disposition Home    Diet: Heart Healthy    On your next visit with your primary care physician please Get Medicines reviewed and adjusted.   Please request your Prim.MD to go over all Hospital Tests and Procedure/Radiological results at the follow up, please get all Hospital records sent to your Prim MD by signing hospital release before you go home.   If you experience worsening of your admission symptoms, develop shortness of breath, life threatening emergency, suicidal or homicidal thoughts you must seek medical attention immediately by calling 911 or calling your MD immediately  if symptoms less severe.  You Must read complete instructions/literature along with all the possible adverse reactions/side effects for all the Medicines you take and that have been prescribed to you. Take any new Medicines after you have completely understood and accpet all the possible adverse reactions/side effects.   Do not drive, operating heavy machinery, perform activities at heights, swimming or participation in water activities or provide baby sitting services if your were admitted for syncope or siezures until you have seen by Primary MD or a Neurologist and advised to do so again.  Do not drive when taking Pain medications.    Do not take more than prescribed Pain, Sleep and Anxiety Medications  Special Instructions: If you have smoked or chewed Tobacco  in the last 2 yrs please stop smoking, stop any regular Alcohol  and or any Recreational drug use.  Wear Seat belts while driving.   Please note  You were cared for by a hospitalist during your hospital stay. If you have any questions about your discharge medications or the care you received while you were in the hospital after you are discharged,  you can call the unit and asked to speak with the hospitalist on call if the hospitalist that took care of you is not available. Once you are discharged, your primary care physician will handle any further medical issues. Please note that NO REFILLS for any discharge medications will be authorized once you are discharged, as it is imperative that you return to your primary care physician (or establish a relationship with a primary care physician if you do not have one) for your aftercare needs so that they can reassess your need for medications and monitor your lab values.

## 2022-07-15 NOTE — Progress Notes (Signed)
Patient discharged per md orders. Patient is stable at time of discharge, BP is stable. Discussed medications and follow up appointments with patient. Reviewed discharge instructions with patient, allowed time for questions, hard copy provided to patient, patient verbalizes understanding. All patient belongings with patient. PIVs x3 discontinued prior to discharge. Patient waiting in room for loved one to bring clothes for discharge.

## 2022-07-15 NOTE — Discharge Summary (Signed)
Physician Discharge Summary  Phillip Kennedy [id number] DOB: 1955-12-23 DOA: 07/10/2022  PCP: [name], MD  Admit date: 07/10/2022 Discharge date: 07/15/2022  Admitted From: Home Disposition:  Home   Recommendations for Outpatient Follow-up:  Follow up with PCP in 1-2 weeks Please obtain BMP/CBC in one week He is continue counseling about medication compliance and substance abuse  Home Health:NO   Discharge Condition:Stable CODE STATUS:FULL Diet recommendation: Heart Healthy    Brief/Interim Summary: 66 yo male smoke past medical history of combined CHF, COPD, CKD 3b, DM type 2, HLD, HTN, Arthritis, CAD, Lymphedema, VDRF March 2023 brought from home by EMS to [facility] ER with shortness of breath.  On arrival to ER he was in respiratory distress and noted to have diffuse wheezing.  Blood pressure was 237/166 and started on NTG gtt in the ER.  He became lethargic and required intubation.  ABG showed severe acute respiratory acidosis.  Started on ABx for pneumonia, and therapy for COPD exacerbation.  UDS positive for cocaine.     9/19: Admitted to ICU. Intubated. CTX and Zithromax started. 9/20: Tolerated SBT, extubated. 06/2021 transferred to [facility]   Acute hypoxic/hypercapnic respiratory failure  COPD exacerbation -Was intubated initially, successfully extubated -Tolerating room air currently -Encourage to  use incentive spirometer and flutter valve -He was treated with Solu-Medrol during hospital stay, no further requirement of steroids on discharge, currently with no wheezing, on room air.   Rt upper lobe community acquired pneumonia. Lactic acidosis -Treated with CTX and Azithromycin, no further need for antibiotics on discharge    Hypertensive emergency from cocaine. Acute on chronic combined CHF. Hx of CAD, HLD. -He did require Imdur drip initially, he was transitioned to oral regimen  -Elevated blood pressure initially related  to cocaine use and medication  noncompliance. -Blood pressure has significantly improved, most recent 140/95, he was counseled about importance of cocaine avoidance and medication compliance -Medication has been adjusted to where his amlodipine has been increased to 10 mg, metoprolol switched to Coreg 12.5 mg p.o. twice daily (dose was lowered due to bradycardia), clonidine was increased to 0.3 mg p.o. 3 times daily, he was started on losartan, continue with home dose hydralazine. - continue ASA, lipitor   DM type 2 poorly controlled with steroid induced hyperglycemia. Resume home regimen   Hypophosphatemia -Repleted   CKD stage IIIb -Continue to monitor   Cocaine use -He was counseled    Medication noncompliance -he was counseled   CKD stage III A -Started on losartan, monitor closely   Discharge Diagnoses:  Principal Problem:   Acute hypoxemic respiratory failure ([place]) Active Problems:   CAP (community acquired pneumonia)   Hypertension   Hypertensive emergency   Malnutrition of moderate degree   Cocaine use    Discharge Instructions  Discharge Instructions     Diet - low sodium heart healthy   Complete by: As directed    Discharge instructions   Complete by: As directed    Follow with Primary MD [name], MD in 7 days   Get CBC, CMP, checked  by Primary MD next visit.    Activity: As tolerated with Full fall precautions use walker/cane & assistance as needed   Disposition Home    Diet: Heart Healthy / carb modified   On your next visit with your primary care physician please Get Medicines reviewed and adjusted.   Please request your Prim.MD to go over all Hospital Tests and Procedure/Radiological results at the follow up, please get all  Hospital records sent to your Prim MD by signing hospital release before you go home.   If you experience worsening of your admission symptoms, develop shortness of breath, life threatening emergency, suicidal or homicidal thoughts you must  seek medical attention immediately by calling 911 or calling your MD immediately  if symptoms less severe.  You Must read complete instructions/literature along with all the possible adverse reactions/side effects for all the Medicines you take and that have been prescribed to you. Take any new Medicines after you have completely understood and accpet all the possible adverse reactions/side effects.   Do not drive, operating heavy machinery, perform activities at heights, swimming or participation in water activities or provide baby sitting services if your were admitted for syncope or siezures until you have seen by Primary MD or a Neurologist and advised to do so again.  Do not drive when taking Pain medications.    Do not take more than prescribed Pain, Sleep and Anxiety Medications  Special Instructions: If you have smoked or chewed Tobacco  in the last 2 yrs please stop smoking, stop any regular Alcohol  and or any Recreational drug use.  Wear Seat belts while driving.   Please note  You were cared for by a hospitalist during your hospital stay. If you have any questions about your discharge medications or the care you received while you were in the hospital after you are discharged, you can call the unit and asked to speak with the hospitalist on call if the hospitalist that took care of you is not available. Once you are discharged, your primary care physician will handle any further medical issues. Please note that NO REFILLS for any discharge medications will be authorized once you are discharged, as it is imperative that you return to your primary care physician (or establish a relationship with a primary care physician if you do not have one) for your aftercare needs so that they can reassess your need for medications and monitor your lab values.   Increase activity slowly   Complete by: As directed       Allergies as of 07/15/2022   No Known Allergies      Medication List      STOP taking these medications    acetaminophen 500 MG tablet Commonly known as: TYLENOL   doxycycline 100 MG capsule Commonly known as: VIBRAMYCIN   metoprolol tartrate 25 MG tablet Commonly known as: LOPRESSOR   oxyCODONE 5 MG immediate release tablet Commonly known as: Oxy IR/ROXICODONE   predniSONE 20 MG tablet Commonly known as: DELTASONE       TAKE these medications    amLODipine 10 MG tablet Commonly known as: NORVASC Take 1 tablet (10 mg total) by mouth daily. Start taking on: July 16, 2022 What changed:  medication strength how much to take   aspirin EC 81 MG tablet Take 81 mg by mouth daily. Swallow whole. What changed: Another medication with the same name was removed. Continue taking this medication, and follow the directions you see here.   atorvastatin 40 MG tablet Commonly known as: LIPITOR Take 40 mg by mouth every evening.   BIOFREEZE EX Apply 1 application topically daily.   carvedilol 12.5 MG tablet Commonly known as: COREG Take 1 tablet (12.5 mg total) by mouth 2 (two) times daily with a meal.   cloNIDine 0.3 MG tablet Commonly known as: CATAPRES Take 1 tablet (0.3 mg total) by mouth 3 (three) times daily. What changed:  medication strength  how much to take   docusate sodium 100 MG capsule Commonly known as: COLACE 1 po BID while on narcotics  STOOL SOFTNER   empagliflozin 10 MG Tabs tablet Commonly known as: JARDIANCE Take 1 tablet (10 mg total) by mouth daily.   FISH OIL PO Take 1 capsule by mouth daily.   hydrALAZINE 100 MG tablet Commonly known as: APRESOLINE Take 1 tablet (100 mg total) by mouth 3 (three) times daily. What changed: Another medication with the same name was removed. Continue taking this medication, and follow the directions you see here.   hydrocortisone cream 1 % Apply 1 application topically daily as needed for itching.   isosorbide mononitrate 120 MG 24 hr tablet Commonly known as: IMDUR Take  1 tablet (120 mg total) by mouth daily.   lactose free nutrition Liqd Take 237 mLs by mouth 3 (three) times daily with meals.   linagliptin 5 MG Tabs tablet Commonly known as: TRADJENTA Take 1 tablet (5 mg total) by mouth daily.   losartan 50 MG tablet Commonly known as: COZAAR Take 1 tablet (50 mg total) by mouth daily. Start taking on: July 16, 2022   multivitamin with minerals Tabs tablet Take 1 tablet by mouth daily. Start taking on: July 16, 2022   polyethylene glycol 17 g packet Commonly known as: MIRALAX / GLYCOLAX 17grams in 6 oz of SOMETHING TO [place] twice a day until bowel movement.  LAXITIVE.  Restart if two days since last bowel movement   ProAir HFA 108 (90 Base) MCG/ACT inhaler Generic drug: albuterol Inhale 2 puffs into the lungs every 4 (four) hours as needed for shortness of breath.   albuterol 108 (90 Base) MCG/ACT inhaler Commonly known as: VENTOLIN HFA Inhale 2-4 puffs into the lungs See admin instructions. Inhale 2-4 puffs into the lungs every four to six hours as needed for shortness of breath or wheezing   Ventolin HFA 108 (90 Base) MCG/ACT inhaler Generic drug: albuterol Inhale 2 puffs into the lungs 4 (four) times daily as needed for wheezing or shortness of breath.   torsemide 20 MG tablet Commonly known as: DEMADEX Take 20 mg by mouth See admin instructions. Take 20 mg by mouth on Monday and Thursday as needed for fluid   VITAMIN B-12 PO Take 1 tablet by mouth daily with breakfast.   VITAMIN B-6 PO Take 1 tablet by mouth daily with breakfast.   VITAMIN D3 PO Take 1 capsule by mouth daily with breakfast.        Follow-up Information     [name], MD Follow up in 1 week(s).   Specialty: Cardiology Contact information: [place] [address] [phone]                No Known Allergies  Consultations: PCCM   Procedures/Studies: DG Chest Port 1 View  Result Date:  07/11/2022 CLINICAL DATA:  Respiratory failure EXAM: PORTABLE CHEST 1 VIEW COMPARISON:  07/10/2022 FINDINGS: Endotracheal tube and gastric catheter are noted in satisfactory position. Cardiac shadow is within normal limits. Lungs are well aerated bilaterally. Increasing airspace opacity in the medial right lung base is noted which may represent aspiration. No bony abnormality is seen. Clearing in the right upper lobe is noted. IMPRESSION: Increasing airspace opacity in the medial right lung base is noted which may be related to aspiration. Right upper lobe has cleared in the interval from the previous exam. Tubes and lines as described. Electronically Signed   By: Inez Catalina  M.D.   On: 07/11/2022 03:51   ECHOCARDIOGRAM COMPLETE  Result Date: 07/10/2022    ECHOCARDIOGRAM REPORT   Patient Name:   Phillip Kennedy Date of Exam: 07/10/2022 Medical Rec #:  [id number]        Height:       69.0 in Accession #:    [id number]       Weight:       200.2 lb Date of Birth:  10-Feb-1956        BSA:          2.067 m Patient Age:    66 years         BP:           112/78 mmHg Patient Gender: M                HR:           59 bpm. Exam Location:  Inpatient Procedure: 2D Echo, Color Doppler and Cardiac Doppler Indications:    CHF  History:        Patient has prior history of Echocardiogram examinations, most                 recent 10/09/2021. CHF, COPD; Risk Factors:Hypertension,                 Dyslipidemia and Diabetes.  Sonographer:    Memory Argue Referring Phys: [place]  1. Left ventricular ejection fraction, by estimation, is 45 to 50%. The left ventricle has mildly decreased function. The left ventricle demonstrates global hypokinesis. There is severe left ventricular hypertrophy. Left ventricular diastolic parameters  are consistent with Grade I diastolic dysfunction (impaired relaxation).  2. Right ventricular systolic function is normal. The right ventricular size is normal. There is normal  pulmonary artery systolic pressure. The estimated right ventricular systolic pressure is [id number] mmHg.  3. Left atrial size was mildly dilated.  4. The mitral valve is normal in structure. Trivial mitral valve regurgitation. No evidence of mitral stenosis.  5. The aortic valve is calcified. There is mild calcification of the aortic valve. Aortic valve regurgitation is trivial. Aortic valve sclerosis is present, with no evidence of aortic valve stenosis. Aortic valve mean gradient measures 5.0 mmHg. Aortic valve Vmax measures 1.53 m/s.  6. The inferior vena cava is dilated in size with <50% respiratory variability, suggesting right atrial pressure of 15 mmHg. Comparison(s): No significant change from prior study. Prior images reviewed side by side. FINDINGS  Left Ventricle: Left ventricular ejection fraction, by estimation, is 45 to 50%. The left ventricle has mildly decreased function. The left ventricle demonstrates global hypokinesis. The left ventricular internal cavity size was normal in size. There is  severe left ventricular hypertrophy. Left ventricular diastolic parameters are consistent with Grade I diastolic dysfunction (impaired relaxation). Right Ventricle: The right ventricular size is normal. No increase in right ventricular wall thickness. Right ventricular systolic function is normal. There is normal pulmonary artery systolic pressure. The tricuspid regurgitant velocity is 2.19 m/s, and  with an assumed right atrial pressure of 15 mmHg, the estimated right ventricular systolic pressure is [id number] mmHg. Left Atrium: Left atrial size was mildly dilated. Right Atrium: Right atrial size was normal in size. Pericardium: There is no evidence of pericardial effusion. Mitral Valve: The mitral valve is normal in structure. Trivial mitral valve regurgitation. No evidence of mitral valve stenosis. Tricuspid Valve: The tricuspid valve is normal in structure. Tricuspid valve regurgitation is mild . No evidence of  tricuspid stenosis. Aortic Valve: The aortic valve is calcified. There is mild calcification of the aortic valve. Aortic valve regurgitation is trivial. Aortic valve sclerosis is present, with no evidence of aortic valve stenosis. Aortic valve mean gradient measures 5.0 mmHg. Aortic valve peak gradient measures 9.4 mmHg. Aortic valve area, by VTI measures 2.10 cm. Pulmonic Valve: The pulmonic valve was normal in structure. Pulmonic valve regurgitation is not visualized. No evidence of pulmonic stenosis. Aorta: The aortic root is normal in size and structure. Venous: The inferior vena cava is dilated in size with less than 50% respiratory variability, suggesting right atrial pressure of 15 mmHg. IAS/Shunts: No atrial level shunt detected by color flow Doppler.  LEFT VENTRICLE PLAX 2D LVIDd:         5.00 cm      Diastology LVIDs:         3.70 cm      LV e' medial:    4.35 cm/s LV PW:         1.50 cm      LV E/e' medial:  18.0 LV IVS:        1.90 cm      LV e' lateral:   5.11 cm/s LVOT diam:     2.40 cm      LV E/e' lateral: 15.4 LV SV:         68 LV SV Index:   33 LVOT Area:     4.52 cm  LV Volumes (MOD) LV vol d, MOD A2C: 171.0 ml LV vol d, MOD A4C: 149.0 ml LV vol s, MOD A2C: 97.0 ml LV vol s, MOD A4C: 81.0 ml LV SV MOD A2C:     74.0 ml LV SV MOD A4C:     149.0 ml LV SV MOD BP:      78.4 ml RIGHT VENTRICLE RV S prime:     11.10 cm/s LEFT ATRIUM             Index        RIGHT ATRIUM           Index LA diam:        3.50 cm 1.69 cm/m   RA Area:     14.00 cm LA Vol (A2C):   69.4 ml 33.58 ml/m  RA Volume:   36.30 ml  17.56 ml/m LA Vol (A4C):   73.2 ml 35.42 ml/m LA Biplane Vol: 78.1 ml 37.79 ml/m  AORTIC VALVE AV Area (Vmax):    1.92 cm AV Area (Vmean):   1.78 cm AV Area (VTI):     2.10 cm AV Vmax:           153.00 cm/s AV Vmean:          99.900 cm/s AV VTI:            0.323 m AV Peak Grad:      9.4 mmHg AV Mean Grad:      5.0 mmHg LVOT Vmax:         64.90 cm/s LVOT Vmean:        39.200 cm/s LVOT VTI:           0.150 m LVOT/AV VTI ratio: 0.46  AORTA Ao Root diam: 3.90 cm MITRAL VALVE               TRICUSPID VALVE MV Area (PHT): 3.91 cm    TR Peak grad:   19.2 mmHg MV Decel Time: 194 msec    TR Vmax:  219.00 cm/s MV E velocity: 78.50 cm/s MV A velocity: 76.40 cm/s  SHUNTS MV E/A ratio:  1.03        Systemic VTI:  0.15 m                            Systemic Diam: 2.40 cm Donato Schultz MD Electronically signed by Donato Schultz MD Signature Date/Time: 07/10/2022/12:21:24 PM    Final    CT HEAD WO CONTRAST  Result Date: 07/10/2022 CLINICAL DATA:  Mental status change, unknown cause EXAM: CT HEAD WITHOUT CONTRAST TECHNIQUE: Contiguous axial images were obtained from the base of the skull through the vertex without intravenous contrast. RADIATION DOSE REDUCTION: This exam was performed according to the departmental dose-optimization program which includes automated exposure control, adjustment of the mA and/or kV according to patient size and/or use of iterative reconstruction technique. COMPARISON:  CT head 01/07/2022 BRAIN: BRAIN Patchy and confluent areas of decreased attenuation are noted throughout the deep and periventricular white matter of the cerebral hemispheres bilaterally, compatible with chronic microvascular ischemic disease. No evidence of large-territorial acute infarction. No parenchymal hemorrhage. No mass lesion. No extra-axial collection. No mass effect or midline shift. No hydrocephalus. Basilar cisterns are patent. Vascular: No hyperdense vessel. Patchy and confluent areas of decreased attenuation are noted throughout the deep and periventricular white matter of the cerebral hemispheres bilaterally, compatible with chronic microvascular ischemic disease. Skull: No acute fracture or focal lesion. Likely old healed left lamina papyracea fracture. Sinuses/Orbits: Mucosal thickening of bilateral maxillary, ethmoid, frontal sinuses paranasal sinuses and mastoid air cells are clear. The orbits are  unremarkable. Other: Partially visualized endotracheal and enteric tubes. IMPRESSION: 1. No acute intracranial abnormality. 2. Sinus disease. Electronically Signed   By: Tish Frederickson M.D.   On: 07/10/2022 02:00   DG Abd Portable 1V  Result Date: 07/10/2022 CLINICAL DATA:  Check gastric catheter placement EXAM: PORTABLE ABDOMEN - 1 VIEW COMPARISON:  None Available. FINDINGS: Gastric catheter is noted within the stomach. No free air is seen. Scattered colonic gas is noted. IMPRESSION: Gastric catheter within the stomach. Electronically Signed   By: Alcide Clever M.D.   On: 07/10/2022 01:36   DG Chest Portable 1 View  Result Date: 07/10/2022 CLINICAL DATA:  Check endotracheal tube placement EXAM: PORTABLE CHEST 1 VIEW COMPARISON:  01/07/2022 FINDINGS: Cardiac shadow is stable. Endotracheal tube and gastric catheter are noted in satisfactory position. Lungs are well aerated bilaterally. Patchy airspace opacity is again noted in the right upper lobe slightly more prominent than that seen on the prior exam. Left lung is clear. IMPRESSION: Tubes and lines as described above. Increasing right upper lobe infiltrate. Electronically Signed   By: Alcide Clever M.D.   On: 07/10/2022 01:35      Subjective:  Significant events overnight, he denies any complaints, ambulated in the hallway yesterday with no dyspnea, chest pain, most recent blood pressure 140/95, patient was asking for early discharge today. Discharge Exam: Vitals:   07/15/22 0747 07/15/22 0945  BP: (!) 183/104 (!) 140/95  Pulse: 71 66  Resp: (!) 22   Temp: 98.8 F (37.1 C)   SpO2: 100%    Vitals:   07/15/22 0359 07/15/22 0432 07/15/22 0747 07/15/22 0945  BP: (!) 154/110  (!) 183/104 (!) 140/95  Pulse:   71 66  Resp: 18  (!) 22   Temp: 98 F (36.7 C)  98.8 F (37.1 C)   TempSrc: Oral  Oral  SpO2:   100%   Weight:  89.7 kg    Height:        General: Pt is alert, awake, not in acute distress Cardiovascular: RRR, S1/S2 +, no  rubs, no gallops Respiratory: CTA bilaterally, no wheezing, no rhonchi Abdominal: Soft, NT, ND, bowel sounds + Extremities: no edema, no cyanosis    The results of significant diagnostics from this hospitalization (including imaging, microbiology, ancillary and laboratory) are listed below for reference.     Microbiology: Recent Results (from the past 240 hour(s))  Culture, Respiratory w Gram Stain     Status: None   Collection Time: 07/10/22  1:04 AM   Specimen: Tracheal Aspirate; Respiratory  Result Value Ref Range Status   Specimen Description TRACHEAL ASPIRATE  Final   Special Requests NONE  Final   Gram Stain   Final    MODERATE WBC PRESENT, PREDOMINANTLY PMN FEW GRAM NEGATIVE RODS FEW GRAM POSITIVE COCCI IN CLUSTERS RARE YEAST    Culture   Final    RARE Normal respiratory flora-no Staph aureus or Pseudomonas seen Performed at [facility], [address], [place], [place] [address]    Report Status 07/14/2022 FINAL  Final  Culture, blood (Routine X 2) w Reflex to ID Panel     Status: None   Collection Time: 07/10/22  1:22 AM   Specimen: BLOOD RIGHT FOREARM  Result Value Ref Range Status   Specimen Description BLOOD RIGHT FOREARM  Final   Special Requests   Final    BOTTLES DRAWN AEROBIC AND ANAEROBIC Blood Culture adequate volume   Culture   Final    NO GROWTH 5 DAYS Performed at [facility], [place] [address], [place], [place] [address]    Report Status 07/15/2022 FINAL  Final  Urine Culture     Status: None   Collection Time: 07/10/22  1:55 AM   Specimen: Urine, Catheterized  Result Value Ref Range Status   Specimen Description URINE, CATHETERIZED  Final   Special Requests NONE  Final   Culture   Final    NO GROWTH Performed at [facility] [address] [place], [place] [address]    Report Status 07/11/2022 FINAL  Final  Culture, blood (Routine X 2) w Reflex to ID Panel     Status: None   Collection Time: 07/10/22  2:20 AM   Specimen:  BLOOD LEFT FOREARM  Result Value Ref Range Status   Specimen Description BLOOD LEFT FOREARM  Final   Special Requests   Final    BOTTLES DRAWN AEROBIC AND ANAEROBIC Blood Culture adequate volume   Culture   Final    NO GROWTH 5 DAYS Performed at [facility], [place] [address] [place], [place] [address]    Report Status 07/15/2022 FINAL  Final  MRSA Next Gen by PCR, Nasal     Status: None   Collection Time: 07/10/22  4:01 AM  Result Value Ref Range Status   MRSA by PCR Next Gen NOT DETECTED NOT DETECTED Final    Comment: (NOTE) The GeneXpert MRSA Assay (FDA approved for NASAL specimens only), is one component of a comprehensive MRSA colonization surveillance program. It is not intended to diagnose MRSA infection nor to guide or monitor treatment for MRSA infections. Test performance is not FDA approved in patients less than 23 years old. Performed at [facility], [place] [address], [place], [place] [address]      Labs: BNP (last 3  results) Recent Labs    01/07/22 0310 01/10/22 0110 07/10/22 0122  BNP 302.1* 211.6* [id number]*   Basic Metabolic Panel: Recent Labs  Lab 07/10/22 0122 07/10/22 0252 07/10/22 1015 07/11/22 0337 07/11/22 0831 07/12/22 0752 07/14/22 0712  NA 141   < > 140 141 142 140 136  K 4.3   < > 4.2 4.4 4.4 3.7 3.8  CL 110  --   --   --  110 108 108  CO2 21*  --   --   --  23 25 21*  GLUCOSE 206*  --   --   --  124* 104* 143*  BUN 25*  --   --   --  37* 34* 35*  CREATININE 2.03*  --   --   --  2.24* 1.68* 1.53*  CALCIUM 8.8*  --   --   --  8.8* 9.1 8.4*  MG 2.1  --   --   --  2.3 2.0 2.1  PHOS 6.7*  --   --   --  3.9 1.3* 3.1   < > = values in this interval not displayed.   Liver Function Tests: Recent Labs  Lab 07/10/22 0122  AST 68*  ALT 58*  ALKPHOS 97  BILITOT 0.2*  PROT 6.8  ALBUMIN 3.8   No results for input(s): "LIPASE", "AMYLASE" in the last 168 hours. No results for input(s): "AMMONIA" in the last 168 hours. CBC: Recent  Labs  Lab 07/10/22 0122 07/10/22 0252 07/10/22 1015 07/11/22 0337 07/11/22 0831 07/12/22 0752 07/14/22 0712  WBC 6.7  --   --   --  6.8 9.5 7.5  HGB 12.2*   < > 10.2* 9.9* 10.1* 11.2* 12.1*  HCT 39.5   < > 30.0* 29.0* 32.0* 34.8* 36.4*  MCV 91.0  --   --   --  87.9 85.3 84.1  PLT 166  --   --   --  117* 145* 163   < > = values in this interval not displayed.   Cardiac Enzymes: No results for input(s): "CKTOTAL", "CKMB", "CKMBINDEX", "TROPONINI" in the last 168 hours. BNP: Invalid input(s): "POCBNP" CBG: Recent Labs  Lab 07/14/22 0811 07/14/22 1131 07/14/22 1642 07/14/22 2106 07/15/22 0757  GLUCAP 127* 113* 172* 157* 330*   D-Dimer No results for input(s): "DDIMER" in the last 72 hours. Hgb A1c No results for input(s): "HGBA1C" in the last 72 hours. Lipid Profile No results for input(s): "CHOL", "HDL", "LDLCALC", "TRIG", "CHOLHDL", "LDLDIRECT" in the last 72 hours. Thyroid function studies No results for input(s): "TSH", "T4TOTAL", "T3FREE", "THYROIDAB" in the last 72 hours.  Invalid input(s): "FREET3" Anemia work up No results for input(s): "VITAMINB12", "FOLATE", "FERRITIN", "TIBC", "IRON", "RETICCTPCT" in the last 72 hours. Urinalysis    Component Value Date/Time   COLORURINE YELLOW 07/10/2022 0155   APPEARANCEUR HAZY (A) 07/10/2022 0155   LABSPEC 1.029 07/10/2022 0155   PHURINE 5.0 07/10/2022 0155   GLUCOSEU NEGATIVE 07/10/2022 0155   HGBUR SMALL (A) 07/10/2022 0155   BILIRUBINUR NEGATIVE 07/10/2022 0155   KETONESUR NEGATIVE 07/10/2022 0155   PROTEINUR >=300 (A) 07/10/2022 0155   NITRITE NEGATIVE 07/10/2022 0155   LEUKOCYTESUR NEGATIVE 07/10/2022 0155   Sepsis Labs Recent Labs  Lab 07/10/22 0122 07/11/22 0831 07/12/22 0752 07/14/22 0712  WBC 6.7 6.8 9.5 7.5   Microbiology Recent Results (from the past 240 hour(s))  Culture, Respiratory w Gram Stain     Status: None   Collection Time: 07/10/22  1:04 AM  Specimen: Tracheal Aspirate; Respiratory   Result Value Ref Range Status   Specimen Description TRACHEAL ASPIRATE  Final   Special Requests NONE  Final   Gram Stain   Final    MODERATE WBC PRESENT, PREDOMINANTLY PMN FEW GRAM NEGATIVE RODS FEW GRAM POSITIVE COCCI IN CLUSTERS RARE YEAST    Culture   Final    RARE Normal respiratory flora-no Staph aureus or Pseudomonas seen Performed at [facility], [address], [place], [place] [address]    Report Status 07/14/2022 FINAL  Final  Culture, blood (Routine X 2) w Reflex to ID Panel     Status: None   Collection Time: 07/10/22  1:22 AM   Specimen: BLOOD RIGHT FOREARM  Result Value Ref Range Status   Specimen Description BLOOD RIGHT FOREARM  Final   Special Requests   Final    BOTTLES DRAWN AEROBIC AND ANAEROBIC Blood Culture adequate volume   Culture   Final    NO GROWTH 5 DAYS Performed at [facility], [place] [address], [place], [place] [address]    Report Status 07/15/2022 FINAL  Final  Urine Culture     Status: None   Collection Time: 07/10/22  1:55 AM   Specimen: Urine, Catheterized  Result Value Ref Range Status   Specimen Description URINE, CATHETERIZED  Final   Special Requests NONE  Final   Culture   Final    NO GROWTH Performed at [facility] [address], [place], [place] [address]    Report Status 07/11/2022 FINAL  Final  Culture, blood (Routine X 2) w Reflex to ID Panel     Status: None   Collection Time: 07/10/22  2:20 AM   Specimen: BLOOD LEFT FOREARM  Result Value Ref Range Status   Specimen Description BLOOD LEFT FOREARM  Final   Special Requests   Final    BOTTLES DRAWN AEROBIC AND ANAEROBIC Blood Culture adequate volume   Culture   Final    NO GROWTH 5 DAYS Performed at [facility], [place] [address], [place], [place] [address]    Report Status 07/15/2022 FINAL  Final  MRSA Next Gen by PCR, Nasal     Status: None   Collection Time: 07/10/22  4:01 AM  Result Value Ref Range Status   MRSA by PCR Next Gen NOT DETECTED  NOT DETECTED Final    Comment: (NOTE) The GeneXpert MRSA Assay (FDA approved for NASAL specimens only), is one component of a comprehensive MRSA colonization surveillance program. It is not intended to diagnose MRSA infection nor to guide or monitor treatment for MRSA infections. Test performance is not FDA approved in patients less than 21 years old. Performed at [facility], [place] [address], [place], [place] [address]      Time coordinating discharge: Over 30 minutes  SIGNED:   Phillips Climes, MD  [facility] 07/15/2022, 11:03 AM Pager   If 7PM-7AM, please contact night-coverage [email or web address]

## 2022-07-17 LAB — LEGIONELLA PNEUMOPHILA SEROGP 1 UR AG: L. pneumophila Serogp 1 Ur Ag: NEGATIVE

## 2023-08-01 ENCOUNTER — Ambulatory Visit (INDEPENDENT_AMBULATORY_CARE_PROVIDER_SITE_OTHER): Payer: 59

## 2023-08-01 ENCOUNTER — Encounter (HOSPITAL_COMMUNITY): Payer: Self-pay

## 2023-08-01 DIAGNOSIS — F1420 Cocaine dependence, uncomplicated: Secondary | ICD-10-CM

## 2023-08-01 NOTE — Progress Notes (Addendum)
Comprehensive Clinical Assessment (CCA) Note  08/01/2023 Phillip Kennedy [id number]  Chief Complaint:  Chief Complaint  Patient presents with   Addiction Problem   Visit Diagnosis: Cocaine Use Disorder, Severe   CCA Screening, Triage and Referral (STR)  Patient Reported Information How did you hear about [place]? [facility] Referral name: [facility]  Referral phone number: No data recorded  Whom do you see for routine medical problems? Other (Comment) (has a cardiologist)  Practice/Facility Name: No data recorded Practice/Facility Phone Number: No data recorded Name of Contact: No data recorded Contact Number: No data recorded Contact Fax Number: No data recorded Prescriber Name: No data recorded Prescriber Address (if known): No data recorded  What Is the Reason for Your Visit/Call Today? No data recorded How Long Has This Been Causing You Problems? No data recorded What Do You Feel Would Help You the Most Today? Alcohol or Drug Use Treatment   Have You Recently Been in Any Inpatient Treatment (Hospital/Detox/Crisis Center/28-Day Program)? No  Name/Location of Program/Hospital:No data recorded How Long Were You There? No data recorded When Were You Discharged? No data recorded  Have You Ever Received Services From [facility] Before? Yes  Who Do You See at [facility]? cardiologist   Have You Recently Had Any Thoughts About Hurting Yourself? No  Are You Planning to Commit Suicide/Harm Yourself At This time? No   Have you Recently Had Thoughts About Hurting Someone Karolee Ohs? No  Explanation: No data recorded  Have You Used Any Alcohol or Drugs in the Past 24 Hours? No  How Long Ago Did You Use Drugs or Alcohol? No data recorded What Did You Use and How Much? 10 to 20 dollars   Do You Currently Have a Therapist/Psychiatrist? No  Name of Therapist/Psychiatrist: No data recorded  Have You Been Recently Discharged From Any Office Practice or Programs? No  Explanation of  Discharge From Practice/Program: No data recorded    CCA Screening Triage Referral Assessment Type of Contact: Face-to-Face  Is this Initial or Reassessment? No data recorded Date Telepsych consult ordered in [facility]:  No data recorded Time Telepsych consult ordered in [facility]:  No data recorded  Patient Reported Information Reviewed? No data recorded Patient Left Without Being Seen? No data recorded Reason for Not Completing Assessment: No data recorded  Collateral Involvement: No data recorded  Does Patient Have a Court Appointed Legal Guardian? No data recorded Name and Contact of Legal Guardian: No data recorded If Minor and Not Living with Parent(s), Who has Custody? No data recorded Is CPS involved or ever been involved? Never  Is APS involved or ever been involved? Never   Patient Determined To Be At Risk for Harm To Self or Others Based on Review of Patient Reported Information or Presenting Complaint? No  Method: No data recorded Availability of Means: No data recorded Intent: No data recorded Notification Required: No data recorded Additional Information for Danger to Others Potential: No data recorded Additional Comments for Danger to Others Potential: No data recorded Are There Guns or Other Weapons in Your Home? No  Types of Guns/Weapons: No data recorded Are These Weapons Safely Secured?                            No data recorded Who Could Verify You Are Able To Have These Secured: No data recorded Do You Have any Outstanding Charges, Pending Court Dates, Parole/Probation? Probation  Contacted To Inform of Risk of Harm To  Self or Others: No data recorded  Location of Assessment: Other (comment) [facility] outpatient)   Does Patient Present under Involuntary Commitment? No  IVC Papers Initial File Date: No data recorded  [place] of Residence: [place]   Patient Currently Receiving the Following Services: No data recorded  Determination  of Need: Routine (7 days)   Options For Referral: Other: Comment (requests individual therapy)     CCA Biopsychosocial Intake/Chief Complaint:  addiction  Current Symptoms/Problems: last used crack/cocaine last week   Patient Reported Schizophrenia/Schizoaffective Diagnosis in Past: No   Strengths: compassion, responsible, faith  Preferences: individual therapy  Abilities: landscaping and lawcare, cleaning   Type of Services Patient Feels are Needed: individual therapy   Initial Clinical Notes/Concerns: No data recorded  Mental Health Symptoms Depression:   None   Duration of Depressive symptoms: No data recorded  Mania:   None   Anxiety:    None   Psychosis:   None   Duration of Psychotic symptoms: No data recorded  Trauma:   None   Obsessions:   None   Compulsions:   None   Inattention:   None   Hyperactivity/Impulsivity:   None   Oppositional/Defiant Behaviors:   None   Emotional Irregularity:   None   Other Mood/Personality Symptoms:  No data recorded   Mental Status Exam Appearance and self-care  Stature:   Tall   Weight:   Average weight   Clothing:   Casual   Grooming:   Normal   Cosmetic use:   None   Posture/gait:   Normal   Motor activity:   Not Remarkable   Sensorium  Attention:   Normal   Concentration:   Normal   Orientation:   X5   Recall/memory:   Normal   Affect and Mood  Affect:   Appropriate   Mood:   Euthymic   Relating  Eye contact:   Normal   Facial expression:   Responsive   Attitude toward examiner:   Cooperative   Thought and Language  Speech flow:  Normal   Thought content:   Appropriate to Mood and Circumstances   Preoccupation:   None   Hallucinations:   None   Organization:  No data recorded  [employer] of Knowledge:   Good   Intelligence:   Average   Abstraction:   Normal   Judgement:   Fair   Dance movement psychotherapist:   Realistic    Insight:   Fair   Decision Making:   Normal   Social Functioning  Social Maturity:   Responsible   Social Judgement:   Normal   Stress  Stressors:   Other (Comment) (none)   Coping Ability:  No data recorded  Skill Deficits:   Self-control   Supports:   Family; Church; Friends/Service system     Religion: Religion/Spirituality Are You A Religious Person?: Yes What is Your Religious Affiliation?:  (none denominational) How Might This Affect Treatment?: will be helpful  Leisure/Recreation: Leisure / Recreation Do You Have Hobbies?: No (reading)  Exercise/Diet: Exercise/Diet Do You Exercise?: No What Type of Exercise Do You Do?:  (unable) Have You Gained or Lost A Significant Amount of Weight in the Past Six Months?: No Do You Follow a Special Diet?: Yes Type of Diet: eliminate salt and pork Do You Have Any Trouble Sleeping?: No   CCA Employment/Education Employment/Work Situation: Employment / Work Situation Employment Situation: Employed 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) What Type of College Degree Do you Have?: none Did You Attend Graduate School?: No Did You Have Any Special Interests In School?: sports Did You Have An Individualized Education Program (IIEP): No Patient's Education Has Been Impacted by Current Illness: No   CCA Family/Childhood History Family and Relationship History: Family history Marital status:  Married Number of Years Married: 30 What types of issues is patient dealing with in the relationship?: none Are you sexually active?: No What is your sexual orientation?: heterosexual Does patient have children?: Yes How many children?: 3 How is patient's relationship with their children?: reports a good relationship with adult sons  Childhood History:  Childhood History By whom was/is the patient raised?: Mother Description of patient's relationship with caregiver when they were a child: good relationship Patient's description of current relationship with people who raised him/her: mother is deceased How were you disciplined when you got in trouble as a child/adolescent?: spanked Does patient have siblings?: Yes Number of Siblings: 6 Description of patient's current relationship with siblings: reports a good relationship with siblins Did patient suffer any verbal/emotional/physical/sexual abuse as a child?: No Did patient suffer from severe childhood neglect?: No Has patient ever been sexually abused/assaulted/raped as an adolescent or adult?: No Was the patient ever a victim of a crime or a disaster?: No Witnessed domestic violence?: No Has patient been affected by domestic violence as an adult?: No  Child/Adolescent Assessment:     CCA Substance Use Alcohol/Drug Use: Alcohol / Drug Use Pain Medications: none Prescriptions: Blood Presure and Choleserol Meds. Cannot identify History of alcohol / drug use?: Yes Longest period of sobriety (when/how long): 4 years, starting in 2017 Negative Consequences of Use: Financial, Legal, Personal relationships Withdrawal Symptoms: Patient aware of relationship between substance abuse and physical/medical complications, Other (Comment) (denies) Substance #1 Name of Substance 1: cocaine 1 - Age of First Use: 40 1 - Amount (size/oz): 10 to 20 dollars 1 - Frequency: 3 times per week 1 - Duration: 20 years with 4 years in recovery 1 -  Last Use / Amount: last week. 10 to 20 dollars 1 - Method of Aquiring: illegal 1- Route of Use: smoked                       ASAM's:  Six Dimensions of Multidimensional Assessment  Dimension 1:  Acute Intoxication and/or Withdrawal Potential:   Dimension 1:  Description of individual's past and current experiences of substance use and withdrawal: no withdrawal symptoms (1)  Dimension 2:  Biomedical Conditions and Complications:   Dimension 2:  Description of patient's biomedical conditions and  complications: High Blood Pressure and High Cholesterol(2)  Dimension 3:  Emotional, Behavioral, or Cognitive Conditions and Complications:   0  Dimension 4:  Readiness to Change:  Dimension 4:  Description of Readiness to Change criteria: willing to engage in treatment 2  Dimension 5:  Relapse, Continued use, or Continued Problem Potential:  Dimension 5:  Relapse, continued use,  or continued problem potential critiera description: has no relapse prevention skills 4  Dimension 6:  Recovery/Living Environment:  Dimension 6:  Recovery/Iiving environment criteria description: lives with wife who is supportive but has no clinical structure.  3  ASAM Severity Score: 12  ASAM Recommended Level of Treatment: Level II, Intensive Outpatient   Substance use Disorder (SUD) Substance Use Disorder (SUD)  Checklist Symptoms of Substance Use: Continued use despite having a persistent/recurrent physical/psychological problem caused/exacerbated by use, Continued use despite persistent or recurrent social, interpersonal problems, caused or exacerbated by use, Persistent desire or unsuccessful efforts to cut down or control use, Presence of craving or strong urge to use, Recurrent use that results in a failure to fulfill major role obligations (work, school, home), Social, occupational, recreational activities given up or reduced due to use, Substance(s) often taken in larger amounts or over longer times than was  intended  Recommendations for Services/Supports/Treatments: Individual therapy  DSM5 Diagnoses: Patient Active Problem List   Diagnosis Date Noted   Cocaine use 07/14/2022   Malnutrition of moderate degree 07/11/2022   Acute hypoxemic respiratory failure (HCC) 07/10/2022   CAP (community acquired pneumonia) 01/09/2022   AKI (acute kidney injury) (HCC) 01/09/2022   Stage 3b chronic kidney disease (CKD) (HCC) 01/09/2022   DM2 (diabetes mellitus, type 2) (HCC) 01/09/2022   Chronic acquired lymphedema 01/09/2022   Benign essential HTN 01/09/2022   Acute on chronic systolic CHF (congestive heart failure) (HCC) 01/09/2022   Respiratory failure (HCC) 01/07/2022   Acute respiratory failure with hypoxia (HCC) 10/09/2021   Hypertensive emergency    AKI (acute kidney injury) (HCC)    Hyperglycemia    Pulmonary edema    Primary localized osteoarthritis of right knee 05/30/2021   Cellulitis of right lower leg 02/01/2021   COPD  GOLD 0  / still smoking 08/30/2020   DOE (dyspnea on exertion) 08/30/2020   Cigarette smoker 08/30/2020   Acute pulmonary edema (HCC) 06/23/2020   Acute respiratory failure with hypoxia (HCC)    Hypertensive emergency    Cellulitis of right leg 03/27/2020   Tobacco use disorder 12/26/2017   Primary osteoarthritis of knee 12/24/2017   Hypertension 11/26/2017   High cholesterol 11/26/2017   Primary osteoarthritis of left knee 11/26/2017   Osteoarthritis of right knee 09/16/2017   Summary:  Phillip Kennedy presents today for a CCA.  He says [facility] referred him to this agency.  Phillip Kennedy says he started smoking crack/cocaine when he was 40. Phillip Kennedy says he has probation for 24 months.  Phillip Kennedy says he had charges for shop lifting but he does not know the date he was charged. He says the last time he used crack/cocaine was last week (does not know the day).  Amount used: 10 to 20 dollars. Phillip Kennedy says he has to stop using cocaine because of his health.  Phillip Kennedy denies depressive  sx, has some anxiety which seems to come when he is using cocaine, denies A/VH.  No evidence of delusions. He is cooperative with the interview. He is oriented x 5. He meets criteria for SAIOP but states he wants individual.  Because Phillip Kennedy's primary insurance is medicare, this therapist provided Phillip Kennedy's number and explained to this patient that he would need to be seen in the [facility] office.  Vipul says he will call Myrna Blazer, MA, LCSW, [facility], LCAS for an appointment.  Patient Centered Plan: Problem: Substance Use     Dates: Start:  08/01/23       Disciplines: Interdisciplinary, PROVIDER  Goal: Jonothan will abstain from drugs 30/30 days per months based on self reports and UDS.     Dates: Start:  08/01/23    Expected End:  11/01/23       Disciplines: Interdisciplinary, PROVIDER         Outcomes     Date/Time User Outcome    08/01/23 1128 Servando Snare Initial                  Goal: Shanen will abstain from drugs and all controlled substances per UDSwhile building a sober support system via attending SMART recovery, NA or AA.     Dates: Start:  08/01/23    Expected End:  11/01/23       Description: Casimiro Needle give this therapist permission to electronically sign the care plan.    Disciplines: Interdisciplinary, PROVIDER         Outcomes     Date/Time User Outcome    08/01/23 1128 Servando Snare Initial                  Intervention: Therapist will educate Jabraylen about SUDS, Patterns and consequences of use, relapse risks, the treatment process and types of mutual groups and provide early recovery and relapse prevention skills.     Dates: Start:  08/01/23                Intervention: Therapist will assist Emidio in identifying and avoiding triggers for using drugs and assist Kamerin in understanding addiction as a disease.     Dates: Start:  08/01/23       Description: Jacksyn gives this therapy permission to electronically sign the care plan               Referrals to Alternative Service(s): Referred to Alternative Service(s):   Place:   Date:   Time:    Referred to Alternative Service(s):   Place:   Date:   Time:    Referred to Alternative Service(s):   Place:   Date:   Time:    Referred to Alternative Service(s):   Place:   Date:   Time:      Collaboration of Care: N/A  Patient/Guardian was advised Release of Information must be obtained prior to any record release in order to collaborate their care with an outside provider. Patient/Guardian was advised if they have not already done so to contact the registration department to sign all necessary forms in order for [place] to release information regarding their care.   Consent: Patient/Guardian gives verbal consent for treatment and assignment of benefits for services provided during this visit. Patient/Guardian expressed understanding and agreed to proceed.   Remigio Eisenmenger, MS, LMFT, LCAS 08/01/2023

## 2023-08-22 ENCOUNTER — Telehealth (HOSPITAL_COMMUNITY): Payer: Self-pay

## 2023-08-22 NOTE — Telephone Encounter (Signed)
Front Desk said this consumer was asking for [employer], rather than individual and asked this therapist to contact him. Therapist calls Phillip Kennedy and verifies his identify by obtaining two identifiers.  Therapist says she is calling as she got the word from the front desk that [name] was interested in [place].  [name] says he changed his mind and wants SAIOP.  He says the [address] location is closer to his house and it will be easier to get to this office.  His primary insurance is Medicare.  He does not have an opioid disorder, so authorization does not need to be sought.  Therapist asked when [name] would like to start group and he asked if he could start tomorrow.  Therapist explained the hours of the group and asked if he could come in early, as this therapist does not see new pt paperwork on him in the system.  Remigio Eisenmenger, MS., LMFT, LCAS 08-22-23

## 2023-08-23 ENCOUNTER — Ambulatory Visit (HOSPITAL_COMMUNITY): Payer: 59

## 2023-08-23 DIAGNOSIS — F1420 Cocaine dependence, uncomplicated: Secondary | ICD-10-CM

## 2023-08-23 NOTE — Progress Notes (Signed)
  No Charge Time: 8:20 to 8:40  Individual Note  Therapist: Discusses the times that SAIOP is offered  Summary: Deontay presents today to discuss SAIOP as he has already had a CCA and said he wanted Individual therapy.  Because he has Medicare as primary insurance he was to call [employer], LCSW, [facility], LCAS however he did not saying he did not want to drive to [facility].  After the days/hours were explained to Casimiro Needle, he said he and his wife is so busy, He could not not attend the schedule.  Therapist gives him the resource of [facility] who accepts Medicare.  He says he will call them to ask for individual therapy.  Therapist gives her number in case consumer needs further assistance.  Remigio Eisenmenger, MS., LMFT, LCAS 08-23-23

## 2023-09-30 IMAGING — DX DG CHEST 1V PORT
1 series · 1 of 1 positions shown · non-contrast
Comparison: 10/10/2021

CLINICAL DATA: Dyspnea

EXAM:
PORTABLE CHEST 1 VIEW

[chest ap]
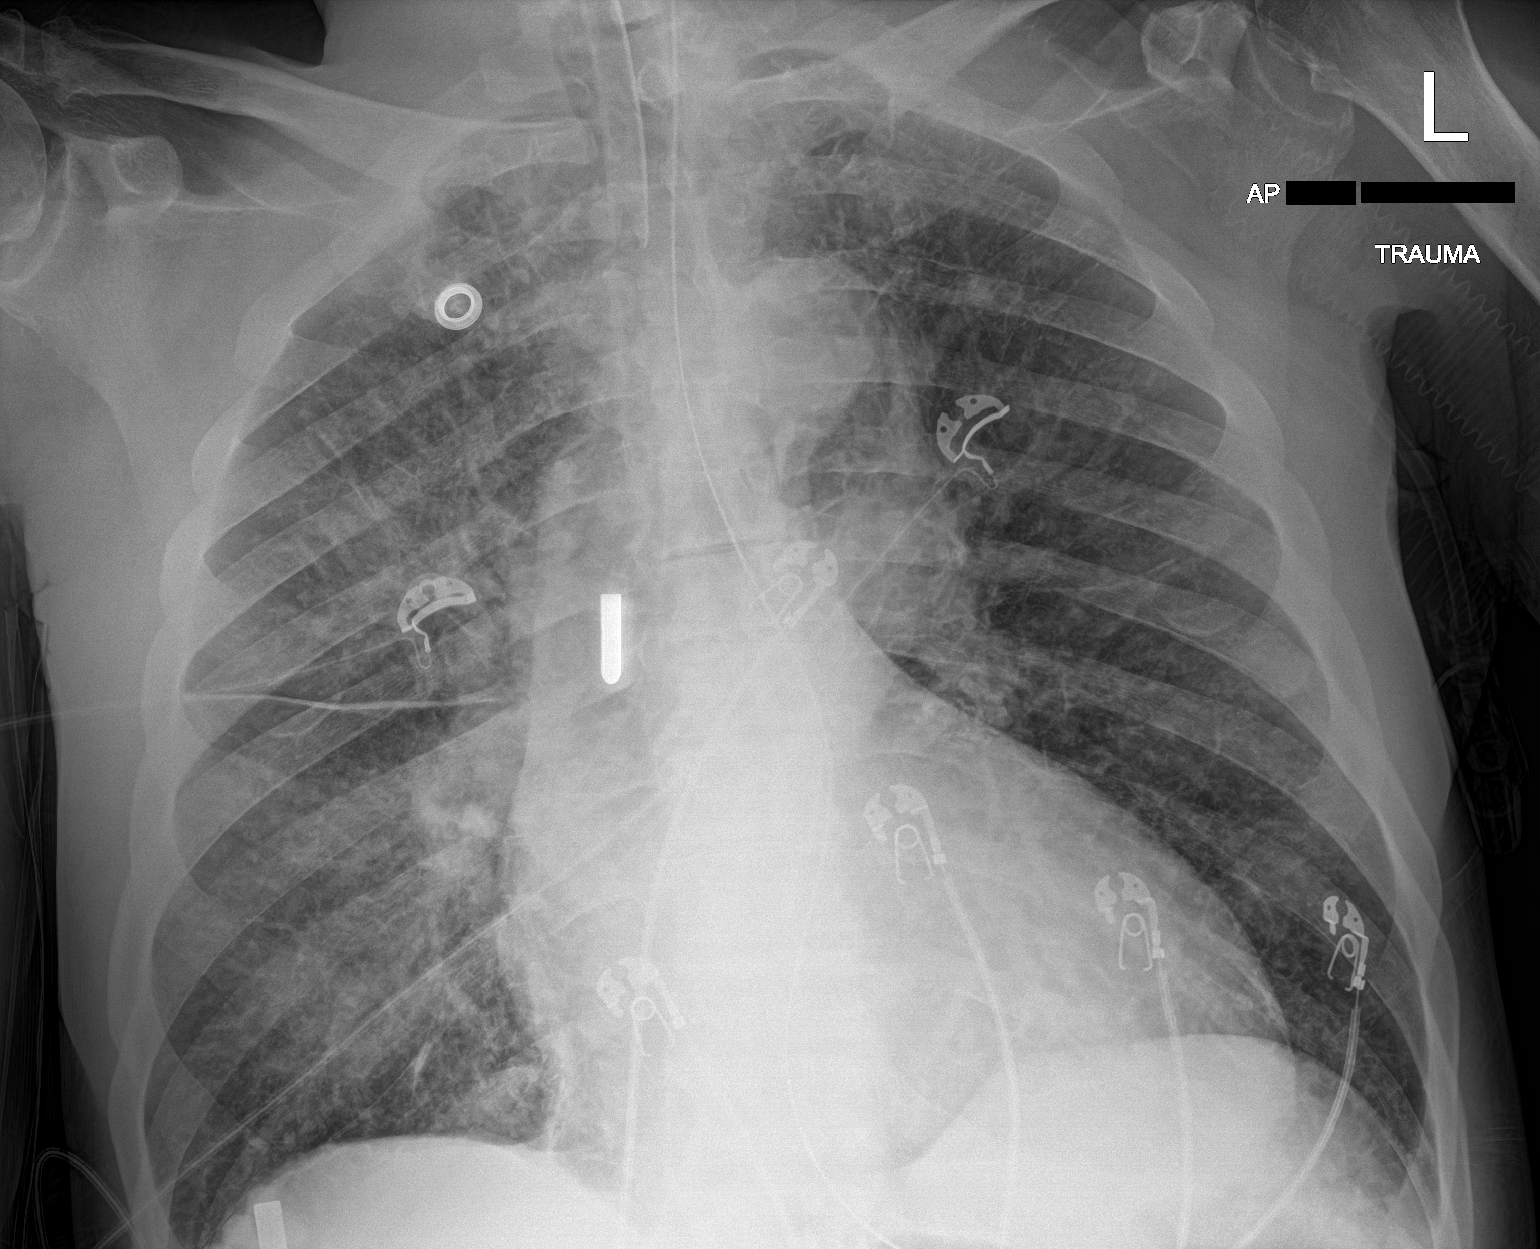

[1 of 1 positions shown; findings below may reference images not displayed]

FINDINGS: Endotracheal tube is seen 8.2 cm above the carina, at the level of
the clavicular heads. Nasogastric tube extends into the upper
abdomen beyond the margin of the examination. The lungs are
symmetrically well expanded. Diffuse interstitial and airspace
infiltrate, more severe throughout the right lung, is present,
asymmetric pulmonary edema versus infection. No pneumothorax or
pleural effusion. Cardiac size appears mildly enlarged. 2.4 cm
metallic density of unclear significance overlies the superior vena
cava, possibly representing an object overlying the patient.
Thoracic aorta is ectatic and tortuous, unchanged from prior
examination. No acute bone abnormality.
IMPRESSION: Support tubes as described above.

Diffuse asymmetric pulmonary infiltrate, edema versus infection.

Stable cardiomegaly.

Stable tortuosity of the thoracic aorta.

## 2023-09-30 IMAGING — CT CT HEAD W/O CM
3 series · 16 of 37 positions shown, 18 images · non-contrast
Comparison: 10/09/2021.

CLINICAL DATA: Delirium.



[Series 3: head without · axial · non-contrast · 0.46mm/px · z∈[-141,-1]mm · 7 of 38 slices shown, 9 images]
[im 5/38  brain]
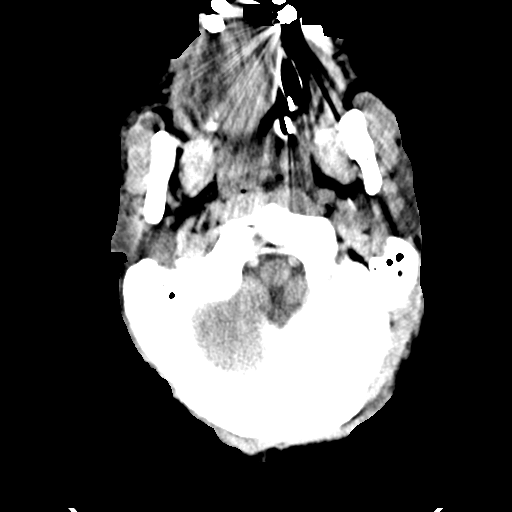
[im 5/38  bone]
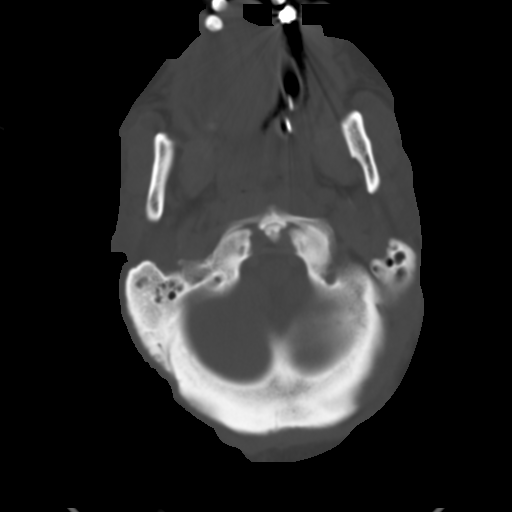
[im 10/38  brain]
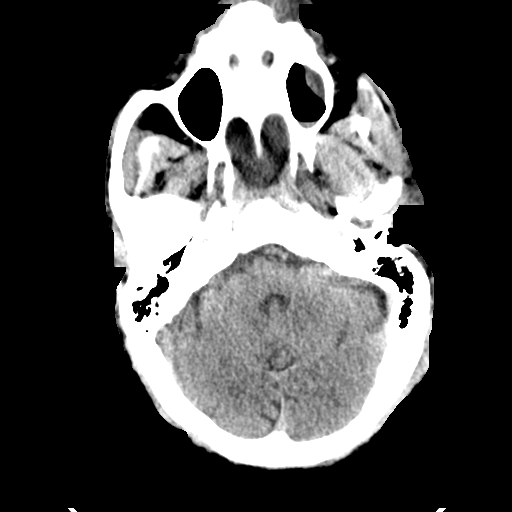
[im 14/38  brain]
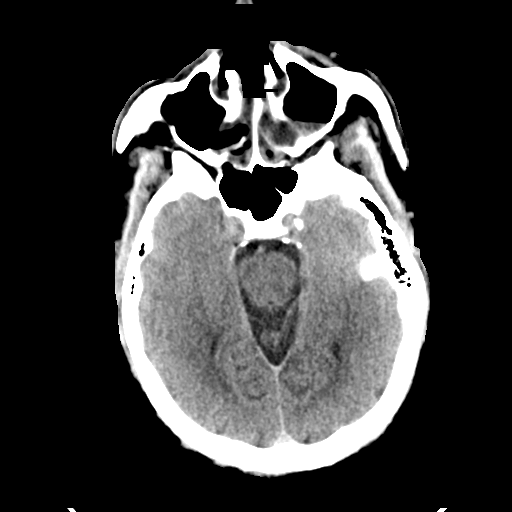
[im 19/38  brain]
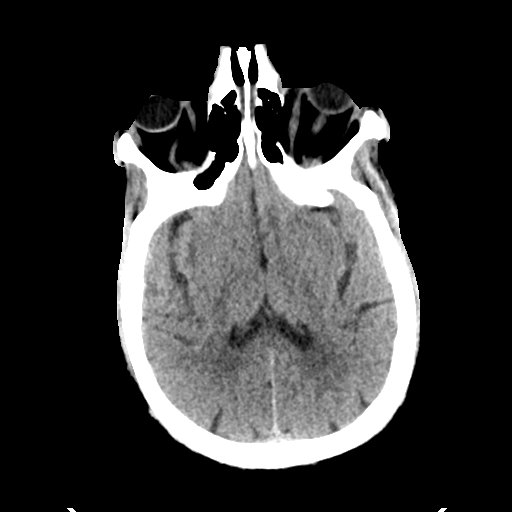
[im 24/38  brain]
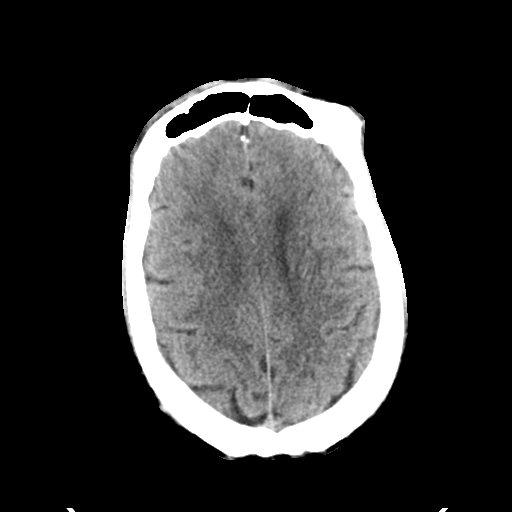
[im 24/38  bone]
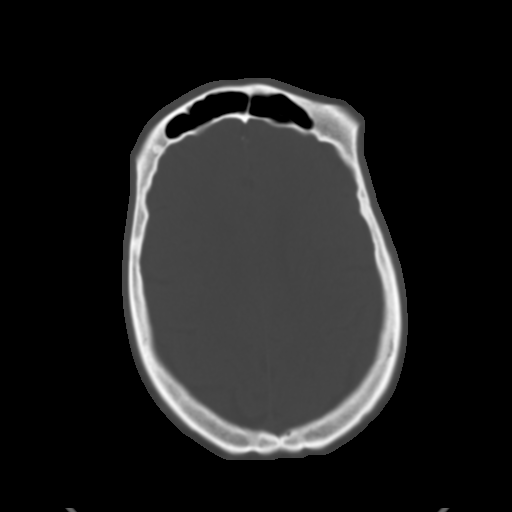
[im 28/38  brain]
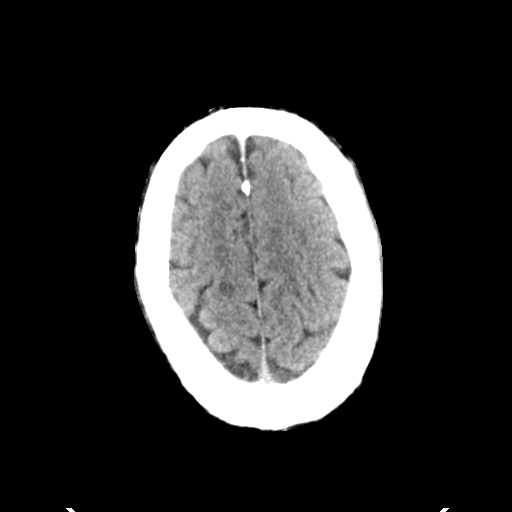
[im 33/38  brain]
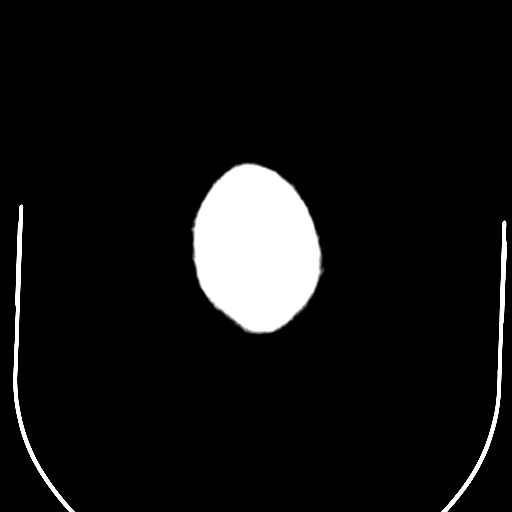

[Series 4: head bone · axial · 0.46mm/px · z∈[-143,-31]mm · 6 of 94 slices shown]
[im 10/94  bone]
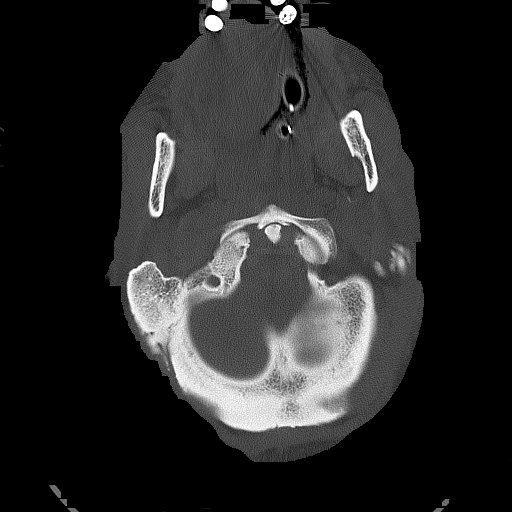
[im 19/94  bone]
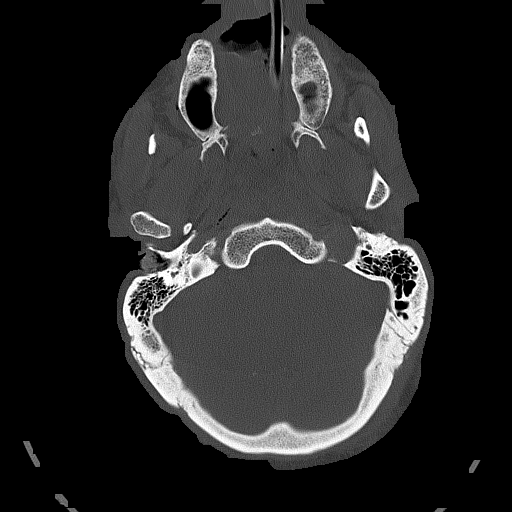
[im 28/94  bone]
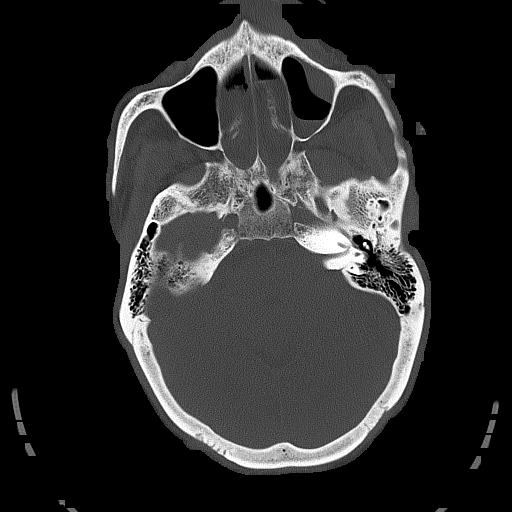
[im 42/94  bone]
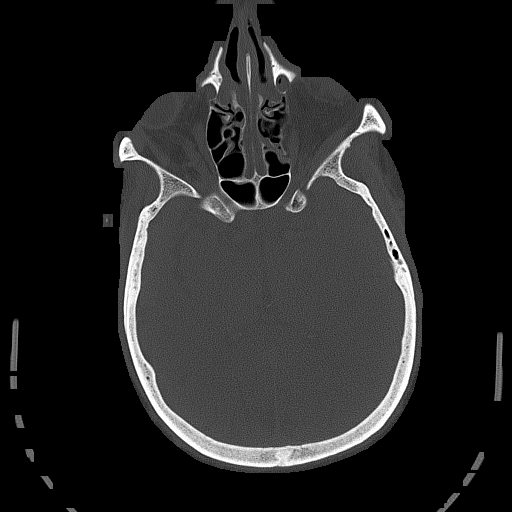
[im 52/94  bone]
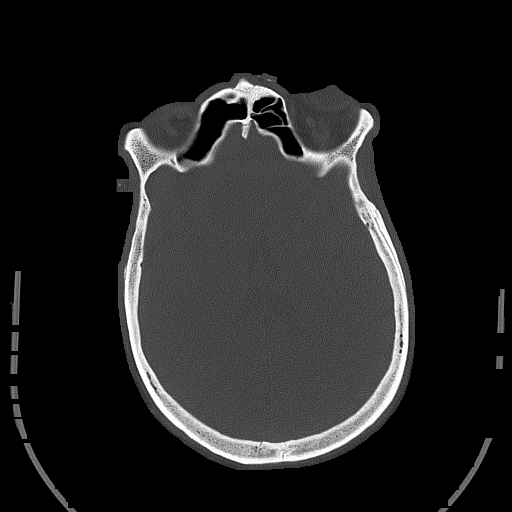
[im 66/94  bone]
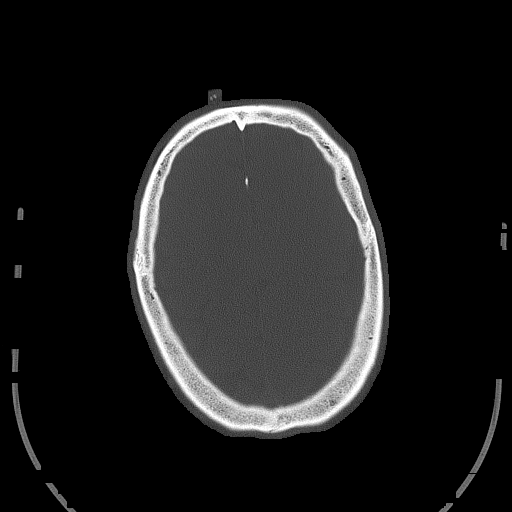

[Series 6: head without sag · sagittal · non-contrast · 0.36mm/px · 3 of 67 slices shown]
[im 24/67  brain]
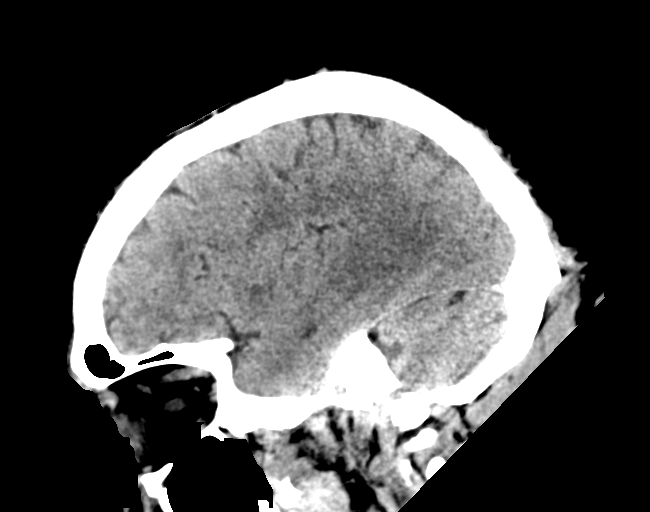
[im 34/67  brain]
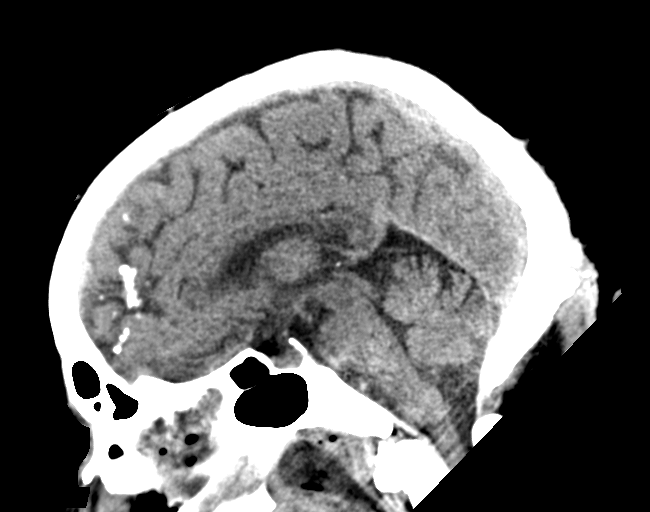
[im 43/67  brain]
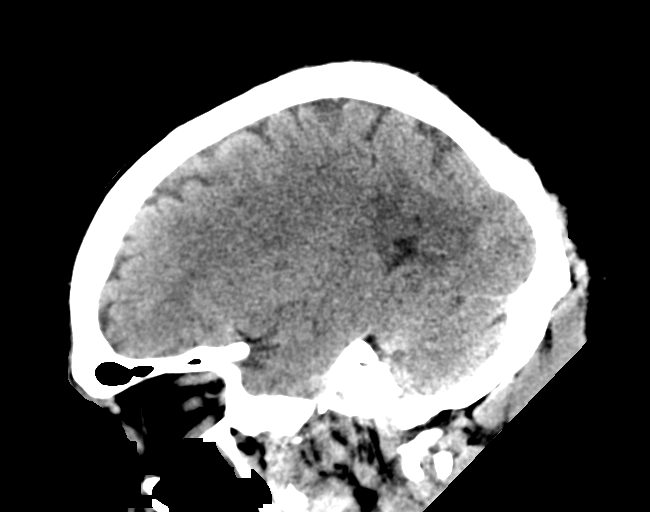

[16 of 37 positions shown; findings below may reference images not displayed]

FINDINGS: Brain: No acute intracranial hemorrhage, midline shift or mass
effect. No extra-axial fluid collection. Subcortical and
periventricular white matter hypodensities are noted bilaterally. No
hydrocephalus.

Vascular: No hyperdense vessel or unexpected calcification.

Skull: Normal. Negative for fracture or focal lesion.

Sinuses/Orbits: There is partial opacification of the ethmoid air
cells and maxillary sinuses bilaterally. Mild mucosal thickening is
present in the sphenoid sinuses. The globes are within normal
limits. There is an old fracture of the medial wall of the left
orbit.

Other: Tubes are present in the oral cavity.
IMPRESSION: 1. No acute intracranial process.
2. Extensive chronic microvascular ischemic changes.
3. Moderate paranasal sinus disease.

## 2024-01-02 DIAGNOSIS — Z0000 Encounter for general adult medical examination without abnormal findings: Secondary | ICD-10-CM | POA: Diagnosis not present

## 2024-01-02 DIAGNOSIS — R5383 Other fatigue: Secondary | ICD-10-CM | POA: Diagnosis not present

## 2024-01-02 DIAGNOSIS — E559 Vitamin D deficiency, unspecified: Secondary | ICD-10-CM | POA: Diagnosis not present

## 2024-01-02 DIAGNOSIS — Z79899 Other long term (current) drug therapy: Secondary | ICD-10-CM | POA: Diagnosis not present

## 2024-01-02 DIAGNOSIS — I10 Essential (primary) hypertension: Secondary | ICD-10-CM | POA: Diagnosis not present

## 2024-01-02 DIAGNOSIS — D539 Nutritional anemia, unspecified: Secondary | ICD-10-CM | POA: Diagnosis not present

## 2024-01-02 DIAGNOSIS — Z131 Encounter for screening for diabetes mellitus: Secondary | ICD-10-CM | POA: Diagnosis not present

## 2024-01-02 DIAGNOSIS — E782 Mixed hyperlipidemia: Secondary | ICD-10-CM | POA: Diagnosis not present

## 2024-01-23 DIAGNOSIS — E782 Mixed hyperlipidemia: Secondary | ICD-10-CM | POA: Diagnosis not present

## 2024-01-23 DIAGNOSIS — I129 Hypertensive chronic kidney disease with stage 1 through stage 4 chronic kidney disease, or unspecified chronic kidney disease: Secondary | ICD-10-CM | POA: Diagnosis not present

## 2024-01-23 DIAGNOSIS — E1122 Type 2 diabetes mellitus with diabetic chronic kidney disease: Secondary | ICD-10-CM | POA: Diagnosis not present

## 2024-01-23 DIAGNOSIS — N1831 Chronic kidney disease, stage 3a: Secondary | ICD-10-CM | POA: Diagnosis not present

## 2024-01-23 DIAGNOSIS — E559 Vitamin D deficiency, unspecified: Secondary | ICD-10-CM | POA: Diagnosis not present

## 2024-04-13 ENCOUNTER — Telehealth: Payer: Self-pay | Admitting: Pharmacist

## 2024-04-13 DIAGNOSIS — I10 Essential (primary) hypertension: Secondary | ICD-10-CM

## 2024-04-13 NOTE — Progress Notes (Signed)
   04/13/2024  Patient ID: Phillip Kennedy, male   DOB: 1956/03/18, 68 y.o.   MRN: [id number]  Pharmacy Quality Measure Review  This patient is appearing on a report for being at risk of failing the adherence measure for hypertension (ACEi/ARB) medications this calendar year.   Medication: Losartan  50 mg Last fill date: 01/30/24  for 30 day supply  Called Patient. He said Losartan  was discontinued by Dr. Claudene.  Patient will fail the measure.   Cassius DOROTHA Brought, PharmD, BCACP Clinical Pharmacist [phone]

## 2024-04-27 DIAGNOSIS — E559 Vitamin D deficiency, unspecified: Secondary | ICD-10-CM | POA: Diagnosis not present

## 2024-04-27 DIAGNOSIS — E1122 Type 2 diabetes mellitus with diabetic chronic kidney disease: Secondary | ICD-10-CM | POA: Diagnosis not present

## 2024-04-27 DIAGNOSIS — N182 Chronic kidney disease, stage 2 (mild): Secondary | ICD-10-CM | POA: Diagnosis not present

## 2024-04-27 DIAGNOSIS — E782 Mixed hyperlipidemia: Secondary | ICD-10-CM | POA: Diagnosis not present

## 2024-04-27 DIAGNOSIS — N1831 Chronic kidney disease, stage 3a: Secondary | ICD-10-CM | POA: Diagnosis not present

## 2024-04-27 DIAGNOSIS — N189 Chronic kidney disease, unspecified: Secondary | ICD-10-CM | POA: Diagnosis not present

## 2024-05-04 DIAGNOSIS — D631 Anemia in chronic kidney disease: Secondary | ICD-10-CM | POA: Diagnosis not present

## 2024-05-04 DIAGNOSIS — E1122 Type 2 diabetes mellitus with diabetic chronic kidney disease: Secondary | ICD-10-CM | POA: Diagnosis not present

## 2024-05-04 DIAGNOSIS — E782 Mixed hyperlipidemia: Secondary | ICD-10-CM | POA: Diagnosis not present

## 2024-05-04 DIAGNOSIS — N1831 Chronic kidney disease, stage 3a: Secondary | ICD-10-CM | POA: Diagnosis not present

## 2024-05-04 DIAGNOSIS — I10 Essential (primary) hypertension: Secondary | ICD-10-CM | POA: Diagnosis not present

## 2024-05-04 DIAGNOSIS — E559 Vitamin D deficiency, unspecified: Secondary | ICD-10-CM | POA: Diagnosis not present

## 2024-05-04 DIAGNOSIS — N182 Chronic kidney disease, stage 2 (mild): Secondary | ICD-10-CM | POA: Diagnosis not present

## 2024-05-25 DIAGNOSIS — E1122 Type 2 diabetes mellitus with diabetic chronic kidney disease: Secondary | ICD-10-CM | POA: Diagnosis not present

## 2024-05-25 DIAGNOSIS — I10 Essential (primary) hypertension: Secondary | ICD-10-CM | POA: Diagnosis not present

## 2024-05-25 DIAGNOSIS — D631 Anemia in chronic kidney disease: Secondary | ICD-10-CM | POA: Diagnosis not present

## 2024-05-25 DIAGNOSIS — E782 Mixed hyperlipidemia: Secondary | ICD-10-CM | POA: Diagnosis not present

## 2024-05-25 DIAGNOSIS — N1831 Chronic kidney disease, stage 3a: Secondary | ICD-10-CM | POA: Diagnosis not present

## 2024-05-25 DIAGNOSIS — E559 Vitamin D deficiency, unspecified: Secondary | ICD-10-CM | POA: Diagnosis not present

## 2024-05-27 DIAGNOSIS — R1900 Intra-abdominal and pelvic swelling, mass and lump, unspecified site: Secondary | ICD-10-CM | POA: Diagnosis not present

## 2024-05-27 DIAGNOSIS — N1831 Chronic kidney disease, stage 3a: Secondary | ICD-10-CM | POA: Diagnosis not present

## 2024-05-28 DIAGNOSIS — N3289 Other specified disorders of bladder: Secondary | ICD-10-CM | POA: Diagnosis not present

## 2024-05-28 DIAGNOSIS — E1122 Type 2 diabetes mellitus with diabetic chronic kidney disease: Secondary | ICD-10-CM | POA: Diagnosis not present

## 2024-05-28 DIAGNOSIS — N1831 Chronic kidney disease, stage 3a: Secondary | ICD-10-CM | POA: Diagnosis not present

## 2024-05-28 DIAGNOSIS — N289 Disorder of kidney and ureter, unspecified: Secondary | ICD-10-CM | POA: Diagnosis not present

## 2024-05-28 DIAGNOSIS — R030 Elevated blood-pressure reading, without diagnosis of hypertension: Secondary | ICD-10-CM | POA: Diagnosis not present

## 2024-05-30 DIAGNOSIS — F17210 Nicotine dependence, cigarettes, uncomplicated: Secondary | ICD-10-CM | POA: Diagnosis not present

## 2024-05-30 DIAGNOSIS — H6122 Impacted cerumen, left ear: Secondary | ICD-10-CM | POA: Diagnosis not present

## 2024-05-30 DIAGNOSIS — D171 Benign lipomatous neoplasm of skin and subcutaneous tissue of trunk: Secondary | ICD-10-CM | POA: Diagnosis not present

## 2024-05-30 DIAGNOSIS — N1832 Chronic kidney disease, stage 3b: Secondary | ICD-10-CM | POA: Diagnosis not present

## 2024-05-30 DIAGNOSIS — E119 Type 2 diabetes mellitus without complications: Secondary | ICD-10-CM | POA: Diagnosis not present

## 2024-05-30 DIAGNOSIS — N3289 Other specified disorders of bladder: Secondary | ICD-10-CM | POA: Diagnosis not present

## 2024-05-30 DIAGNOSIS — I10 Essential (primary) hypertension: Secondary | ICD-10-CM | POA: Diagnosis not present

## 2024-06-17 DIAGNOSIS — D171 Benign lipomatous neoplasm of skin and subcutaneous tissue of trunk: Secondary | ICD-10-CM | POA: Diagnosis not present

## 2024-06-17 DIAGNOSIS — N1832 Chronic kidney disease, stage 3b: Secondary | ICD-10-CM | POA: Diagnosis not present

## 2024-06-17 DIAGNOSIS — I10 Essential (primary) hypertension: Secondary | ICD-10-CM | POA: Diagnosis not present

## 2024-06-17 DIAGNOSIS — F17210 Nicotine dependence, cigarettes, uncomplicated: Secondary | ICD-10-CM | POA: Diagnosis not present

## 2024-06-17 DIAGNOSIS — N3289 Other specified disorders of bladder: Secondary | ICD-10-CM | POA: Diagnosis not present

## 2024-06-17 DIAGNOSIS — E119 Type 2 diabetes mellitus without complications: Secondary | ICD-10-CM | POA: Diagnosis not present

## 2024-06-24 DIAGNOSIS — R944 Abnormal results of kidney function studies: Secondary | ICD-10-CM | POA: Diagnosis not present

## 2024-06-24 DIAGNOSIS — I10 Essential (primary) hypertension: Secondary | ICD-10-CM | POA: Diagnosis not present

## 2024-06-24 DIAGNOSIS — D72819 Decreased white blood cell count, unspecified: Secondary | ICD-10-CM | POA: Diagnosis not present

## 2024-06-24 DIAGNOSIS — R531 Weakness: Secondary | ICD-10-CM | POA: Diagnosis not present

## 2024-06-24 DIAGNOSIS — E1165 Type 2 diabetes mellitus with hyperglycemia: Secondary | ICD-10-CM | POA: Diagnosis not present

## 2024-06-24 DIAGNOSIS — E119 Type 2 diabetes mellitus without complications: Secondary | ICD-10-CM | POA: Diagnosis not present

## 2024-06-24 DIAGNOSIS — D649 Anemia, unspecified: Secondary | ICD-10-CM | POA: Diagnosis not present

## 2024-06-24 DIAGNOSIS — F17210 Nicotine dependence, cigarettes, uncomplicated: Secondary | ICD-10-CM | POA: Diagnosis not present

## 2024-06-24 DIAGNOSIS — Z79899 Other long term (current) drug therapy: Secondary | ICD-10-CM | POA: Diagnosis not present

## 2024-06-24 DIAGNOSIS — I498 Other specified cardiac arrhythmias: Secondary | ICD-10-CM | POA: Diagnosis not present

## 2024-06-24 DIAGNOSIS — R0602 Shortness of breath: Secondary | ICD-10-CM | POA: Diagnosis not present

## 2024-06-24 DIAGNOSIS — R7981 Abnormal blood-gas level: Secondary | ICD-10-CM | POA: Diagnosis not present

## 2024-06-24 DIAGNOSIS — R9431 Abnormal electrocardiogram [ECG] [EKG]: Secondary | ICD-10-CM | POA: Diagnosis not present

## 2024-06-24 DIAGNOSIS — Z556 Problems related to health literacy: Secondary | ICD-10-CM | POA: Diagnosis not present

## 2024-08-06 ENCOUNTER — Other Ambulatory Visit (HOSPITAL_BASED_OUTPATIENT_CLINIC_OR_DEPARTMENT_OTHER): Payer: Self-pay | Admitting: Adult Medicine

## 2024-08-06 DIAGNOSIS — Z122 Encounter for screening for malignant neoplasm of respiratory organs: Secondary | ICD-10-CM

## 2024-10-16 ENCOUNTER — Ambulatory Visit (HOSPITAL_BASED_OUTPATIENT_CLINIC_OR_DEPARTMENT_OTHER)

## 2024-10-21 ENCOUNTER — Other Ambulatory Visit: Payer: Self-pay | Admitting: Surgery

## 2024-10-28 ENCOUNTER — Encounter (HOSPITAL_COMMUNITY): Payer: Self-pay | Admitting: Surgery

## 2024-10-28 ENCOUNTER — Other Ambulatory Visit: Payer: Self-pay

## 2024-10-28 NOTE — Progress Notes (Signed)
 [facility]  Cardiologist - Dr. Salena Negri (records requested- pt says he just saw Dr. Negri today in office)  PPM/ICD - denies   Chest x-ray - 06/24/24 [facility] EKG - 06/24/24 [facility]- requested Stress Test - denies ECHO - 07/10/22 Cardiac Cath - 06/28/20  CPAP - denies  DM- pt says he does not check CBG at home and does not know typical fasting levels  Blood Thinner Instructions: n/a Aspirin  Instructions: f/u with surgeon  ERAS Protcol - clears until 1030  COVID TEST- n/a  Anesthesia review: yes, cardiac hx, records requested, difficult airway noted  Patient verbally denies any shortness of breath, fever, cough and chest pain during phone call      Questions were answered. Patient verbalized understanding of instructions.

## 2024-10-28 NOTE — Pre-Procedure Instructions (Signed)
 -------------  SDW INSTRUCTIONS given:  Your procedure is scheduled on 1/12.  Report to [facility] Main Entrance A at 11:00 A.M., and check in at the Admitting office.  Any questions or running late day of surgery: call [phone]    Remember:  Do not eat after midnight the night before your surgery  You may drink clear liquids until 10:30 AM the morning of your surgery.   Clear liquids allowed are: Water , Non-Citrus Juices (without pulp), Carbonated Beverages, Clear Tea, Black Coffee Only, and Gatorade    Take these medicines the morning of surgery with A SIP OF WATER   amLODipine  (NORVASC )  carvedilol  (COREG )  cloNIDine  (CATAPRES )  ezetimibe (ZETIA)  hydrALAZINE  (APRESOLINE )  tamsulosin (FLOMAX)     May take these medicines IF NEEDED: albuterol  (VENTOLIN  HFA)- bring inhaler   Follow your surgeon's instructions on when to stop Aspirin .  If no instructions were given by your surgeon then you will need to call the office to get those instructions.    As of today, STOP taking any Aleve, Naproxen, Ibuprofen , Motrin , Advil , Goody's, BC's, all herbal medications, fish oil, and all vitamins.  WHAT DO I DO ABOUT MY DIABETES MEDICATION?   Do not take metFORMIN (GLUCOPHAGE) the morning of surgery.  STOP taking Jardiance  72 hours prior to surgery. Last dose 1/8       HOW TO MANAGE YOUR DIABETES BEFORE AND AFTER SURGERY  Why is it important to control my blood sugar before and after surgery? Improving blood sugar levels before and after surgery helps healing and can limit problems. A way of improving blood sugar control is eating a healthy diet by:  Eating less sugar and carbohydrates  Increasing activity/exercise  Talking with your doctor about reaching your blood sugar goals High blood sugars (greater than 180 mg/dL) can raise your risk of infections and slow your recovery, so you will need to focus on controlling your diabetes during the weeks before surgery. Make sure that  the doctor who takes care of your diabetes knows about your planned surgery including the date and location.  How do I manage my blood sugar before surgery? Check your blood sugar at least 4 times a day, starting 2 days before surgery, to make sure that the level is not too high or low.  Check your blood sugar the morning of your surgery when you wake up and every 2 hours until you get to the Short Stay unit.  If your blood sugar is less than 70 mg/dL, you will need to treat for low blood sugar: Do not take insulin . Treat a low blood sugar (less than 70 mg/dL) with  cup of clear juice (cranberry or apple), 4 glucose tablets, OR glucose gel. Recheck blood sugar in 15 minutes after treatment (to make sure it is greater than 70 mg/dL). If your blood sugar is not greater than 70 mg/dL on recheck, call [phone] for further instructions. Report your blood sugar to the short stay nurse when you get to Short Stay.  If you are admitted to the hospital after surgery: Your blood sugar will be checked by the staff and you will probably be given insulin  after surgery (instead of oral diabetes medicines) to make sure you have good blood sugar levels. The goal for blood sugar control after surgery is 80-180 mg/dL.   Do NOT Smoke (Tobacco/Vaping) 24 hours prior to your procedure  If you use a CPAP at night, you may bring all equipment for your overnight stay.  You will be asked to remove any contacts, glasses, piercing's, hearing aid's, dentures/partials prior to surgery. Please bring cases for these items if needed.     Patients discharged the day of surgery will not be allowed to drive home, and someone needs to stay with them for 24 hours.  SURGICAL WAITING ROOM VISITATION Patients may have no more than 2 support people in the waiting area - these visitors may rotate.   Pre-op nurse will coordinate an appropriate time for 1 ADULT support person, who may not rotate, to accompany patient in  pre-op.  Children under the age of 17 must have an adult with them who is not the patient and must remain in the main waiting area with an adult.  If the patient needs to stay at the hospital during part of their recovery, the visitor guidelines for inpatient rooms apply.  Please refer to the [facility] website for the visitor guidelines for any additional information.   Special instructions:   [place]- Preparing For Surgery   Please follow these instructions carefully.   Shower the NIGHT BEFORE SURGERY and the MORNING OF SURGERY with DIAL Soap.   Pat yourself dry with a CLEAN TOWEL.  Wear CLEAN PAJAMAS to bed the night before surgery  Place CLEAN SHEETS on your bed the night of your first shower and DO NOT SLEEP WITH PETS.   Additional instructions for the day of surgery: DO NOT APPLY any lotions, deodorants, cologne, or perfumes.   Do not wear jewelry or makeup Do not wear nail polish, gel polish, artificial nails, or any other type of covering on natural nails (fingers and toes) Do not bring valuables to the hospital. [facility] is not responsible for valuables/personal belongings. Put on clean/comfortable clothes.  Please brush your teeth.  Ask your nurse before applying any prescription medications to the skin.

## 2024-10-29 ENCOUNTER — Encounter (HOSPITAL_COMMUNITY): Payer: Self-pay | Admitting: Surgery

## 2024-10-29 NOTE — Progress Notes (Addendum)
 Anesthesia Chart Review: Phillip Kennedy  Case: [id number] Date/Time: 11/02/24 1320   Procedure: EXCISION MASS, BACK (Right) - RIGHT FLANK MASS   Anesthesia type: Monitor Anesthesia Care   Pre-op diagnosis: RIGHT FLANK MASS   [facility]: [facility] ROOM 20 / [facility] OR   Surgeons: Vernetta Berg, MD       DISCUSSION: Patient is a 69 year old male scheduled for the above procedure.   History includes smoking, HTN, hypercholesterolemia, COPD/chronic bronchitis, CAD (NSTEMI in setting cocaine, hypertensive emergency, Klebsiella PNA with respiratory faliure 06/2020, cath showed mild-moderate CAD), chronic systolic and diastolic CHF, CKD, DM2, osteoarthritis (s/p left TKA 12/24/2017; right TKA 05/30/2021), cocaine use (UDS + 06/24/2024).  He has a clinical FYI of DIFFICULT AIRWAY for intubation in the ED on 07/10/2022 for acute respiratory failure in setting of COPD exacerbation/PNA and hypertensive emergency from cocaine. Per intubation records: Rapid sequence induction, mask ventilation without difficulty.  GlideScope used to place 7.5 mm ETT, 1 attempt, Grade 1 view, rigid stylet.   He reportedly had cardiology follow-up with Dr. Claudene on 10/28/2024.  Latest records requested.  Will leave chart for follow-up.  Per Dr. Damian initial consultation, right flank mass is suspected to be a lipoma or a desmoid cyst. Given enlargement and discomfort, surgical excision recommended. He is hoping to be able to excise the mass using local with MAC. He is aware of patient polysubstance use and wrote, I have discussed his drug use and how if this persists prior surgery the surgery could be canceled and he will try to abstain prior to surgery.  ADDENDUM 10/30/2024 9:55 PM: Dr. Shirline office is closed until Monday 11/02/2024 AM, so unable to get records from patient's office visit this week. Request was faxed on Thursday afternoon. If not received by surgery arrival time, then staff can contact Dr. Shirline office to  re-request.    VS: Ht 5' 11 (1.803 m)   Wt 88.9 kg   BMI 27.34 kg/m  BP Readings from Last 3 Encounters:  07/15/22 (!) 146/78  01/10/22 (!) 179/135  10/12/21 (!) 161/94   Pulse Readings from Last 3 Encounters:  07/15/22 68  01/10/22 74  10/12/21 67    PROVIDERS: Kahoano, Haku, MD is PCP [facility]) Claudene Pacific, MD is cardiologist   LABS: For day of surgery as indicated. As of 06/24/2024 (Care Everywhere): WBC 3.9, hemoglobin 13.0, hematocrit 41.8, platelet count 141, sodium 139, glucose 114, creatinine 1.60, BUN 27, eGFR 47, UDS positive for cocaine.  IMAGES: CXR 06/24/2024 ([facility]): Unremarkable cardiomediastinal silhouette.   No pulmonary venous congestion.  No acute suspicious pulmonary infiltrate identified.  No pneumothorax identified.  IMPRESSION: No acute cardiopulmonary disease identified.   EKG: For day of surgery as indicated. EKG 06/24/2024 ([facility]): Per Narrative in [facility]: Normal sinus rhythm with sinus arrhythmia  T wave abnormality, consider lateral ischemia  Prolonged QT  Abnormal ECG  When compared with ECG of 24-Jun-2024 16:32,  No significant change was found  Terlecki, Zebedee [phone]) on 06/24/2024 9:31:29 PM certifies that he/she has reviewed the ECG tracing and confirms the  independent interpretation is  correct.   EKG 07/10/2022: Sinus rhythm Ventricular premature complex Abnormal T, consider ischemia, lateral leads since last tracing no significant change Confirmed by Cleotilde Rogue ([id number]) on 07/11/2022 2:30:19 PM   CV: Echo 07/10/2022: IMPRESSIONS   1. Left ventricular ejection fraction, by estimation, is 45 to 50%. The  left ventricle has mildly decreased function. The left ventricle  demonstrates global hypokinesis. There is  severe left ventricular  hypertrophy. Left ventricular diastolic parameters   are consistent with Grade I diastolic dysfunction (impaired relaxation).   2. Right ventricular systolic function is normal. The right  ventricular  size is normal. There is normal pulmonary artery systolic pressure. The  estimated right ventricular systolic pressure is 34.2 mmHg.   3. Left atrial size was mildly dilated.   4. The mitral valve is normal in structure. Trivial mitral valve  regurgitation. No evidence of mitral stenosis.   5. The aortic valve is calcified. There is mild calcification of the  aortic valve. Aortic valve regurgitation is trivial. Aortic valve  sclerosis is present, with no evidence of aortic valve stenosis. Aortic  valve mean gradient measures 5.0 mmHg. Aortic  valve Vmax measures 1.53 m/s.   6. The inferior vena cava is dilated in size with <50% respiratory  variability, suggesting right atrial pressure of 15 mmHg.  - Comparison(s): No significant change from prior study. Prior images  reviewed side by side.  - Comparison: 10/09/2021: LVEF 45-50%, LV global hypokinesis, moderately dilated LV cavity, mild LVH, grade 1 DD, normal RV systolic function, trivial MR, aortic root 40 mm; 06/23/2020: LVEF 25-30%, LV global hypokinesis, mildly dilated LV cavity, grade 1 DD, mildly reduced RV systolic function, mildi MR, mild (grade II) atheroma plaque of the ascending aorta.    Cardiac Cath 06/28/2020: Prox RCA lesion is 40% stenosed. Prox LAD lesion is 30% stenosed. Mid Cx to Dist Cx lesion is 40% stenosed. 1st Diag lesion is 30% stenosed. 2nd Diag lesion is 50% stenosed. Dist LAD lesion is 50% stenosed.   Medical treatment with life style modification.   Past Medical History:  Diagnosis Date   Arthritis    knees and back (12/24/2017)   CHF (congestive heart failure) (HCC)    Chronic bronchitis (HCC)    Chronic kidney disease    COPD (chronic obstructive pulmonary disease) (HCC)    Gold 0   Diabetes mellitus without complication (HCC)    High cholesterol    Hypertension     Past Surgical History:  Procedure Laterality Date   CORONARY ANGIOGRAPHY N/A 06/28/2020   Procedure: CORONARY  ANGIOGRAPHY (CATH LAB);  Surgeon: Claudene Pacific, MD;  Location: [facility] INVASIVE CV LAB;  Service: Cardiovascular;  Laterality: N/A;   JOINT REPLACEMENT     KNEE CARTILAGE SURGERY Bilateral    MULTIPLE TOOTH EXTRACTIONS     TOTAL KNEE ARTHROPLASTY Left 12/24/2017   TOTAL KNEE ARTHROPLASTY Left 12/24/2017   Procedure: LEFT TOTAL KNEE ARTHROPLASTY;  Surgeon: Beverley Evalene BIRCH, MD;  Location: [facility] OR;  Service: Orthopedics;  Laterality: Left;   TOTAL KNEE ARTHROPLASTY Right 05/30/2021   Procedure: RIGHT TOTAL KNEE ARTHROPLASTY;  Surgeon: Beverley Evalene BIRCH, MD;  Location: [facility] ORS;  Service: Orthopedics;  Laterality: Right;    MEDICATIONS:  albuterol  (VENTOLIN  HFA) 108 (90 Base) MCG/ACT inhaler   amLODipine  (NORVASC ) 5 MG tablet   aspirin  EC 81 MG tablet   atorvastatin  (LIPITOR) 20 MG tablet   carvedilol  (COREG ) 12.5 MG tablet   Cholecalciferol (VITAMIN D3 PO)   cloNIDine  (CATAPRES ) 0.2 MG tablet   Cyanocobalamin (VITAMIN B-12 PO)   desipramine (NORPRAMIN) 50 MG tablet   docusate sodium  (COLACE) 100 MG capsule   empagliflozin  (JARDIANCE ) 25 MG TABS tablet   ezetimibe (ZETIA) 10 MG tablet   hydrALAZINE  (APRESOLINE ) 10 MG tablet   hydrocortisone cream 1 %   Menthol , Topical Analgesic, (BIOFREEZE EX)   metFORMIN (GLUCOPHAGE) 500 MG tablet   tamsulosin (FLOMAX) 0.4  MG CAPS capsule   Last Jardiance  planned for 10/30/2023.   Isaiah Ruder, PA-C Surgical Short Stay/Anesthesiology [facility] Phone [phone] [facility] Phone [phone] 10/29/2024 2:18 PM

## 2024-10-30 NOTE — Anesthesia Preprocedure Evaluation (Signed)
"                                    Anesthesia Evaluation  Patient identified by MRN, date of birth, ID band Patient awake    Reviewed: Allergy & Precautions, NPO status , Patient's Chart, lab work & pertinent test results  History of Anesthesia Complications Negative for: history of anesthetic complications  Airway Mallampati: I  TM Distance: >3 FB Neck ROM: Full    Dental  (+) Dental Advisory Given, Edentulous Upper, Missing   Pulmonary COPD, Current Smoker   Pulmonary exam normal        Cardiovascular hypertension, Pt. on medications + CAD and +CHF  Normal cardiovascular exam   '23 TTE - EF 45 to 50%. Global hypokinesis. There is severe left ventricular hypertrophy. Grade I diastolic dysfunction (impaired relaxation). Left atrial size was mildly dilated. Trivial mitral valve regurgitation. Aortic valve regurgitation is trivial.     Neuro/Psych negative neurological ROS  negative psych ROS   GI/Hepatic negative GI ROS,,,(+)     substance abuse  cocaine use  Endo/Other  diabetes    Renal/GU CRFRenal disease     Musculoskeletal  (+) Arthritis ,    Abdominal   Peds  Hematology negative hematology ROS (+)   Anesthesia Other Findings   Reproductive/Obstetrics                              Anesthesia Physical Anesthesia Plan  ASA: 3  Anesthesia Plan: MAC   Post-op Pain Management: Tylenol  PO (pre-op)*   Induction:   PONV Risk Score and Plan: 1 and Propofol  infusion and Treatment may vary due to age or medical condition  Airway Management Planned: Natural Airway and Simple Face Mask  Additional Equipment: None  Intra-op Plan:   Post-operative Plan:   Informed Consent: I have reviewed the patients History and Physical, chart, labs and discussed the procedure including the risks, benefits and alternatives for the proposed anesthesia with the patient or authorized representative who has indicated his/her  understanding and acceptance.       Plan Discussed with: CRNA and Anesthesiologist  Anesthesia Plan Comments: (PAT note written 10/29/2024 by Agata Lucente, PA-C. Case is posted for MAC. LMA as backup)         Anesthesia Quick Evaluation  "

## 2024-11-01 NOTE — H&P (Signed)
 " REFERRING PHYSICIAN: Kahoano, Haku H PROVIDER: VICENTA DASIE POLI, MD MRN: [id number] DOB: 1956/05/20 DATE OF ENCOUNTER: 10/21/2024 Subjective   Chief Complaint: Right flank mass  History of Present Illness: Phillip Kennedy is a 69 y.o. male who is seen today as an office consultation for evaluation of right flank mass  This is a 69 year old gentleman who is referred here for evaluation of a mass on his right flank. He reports it has been present for several years and is continuing to enlarge and now is causing some discomfort superficially in the flank. It hurts mostly with activities. He denies trauma to the area and no drainage from the area. He is otherwise without complaints.  Review of Systems: A complete review of systems was obtained from the patient. I have reviewed this information and discussed as appropriate with the patient. See HPI as well for other ROS.  ROS   Medical History: Past Medical History:  Diagnosis Date  Anxiety  Arrhythmia  Arthritis  COPD (chronic obstructive pulmonary disease) (CMS/HHS-HCC)  Diabetes mellitus without complication (CMS/HHS-HCC)  Hypertension  Substance abuse (CMS-HCC)   There is no problem list on file for this patient.  Past Surgical History:  Procedure Laterality Date  JOINT REPLACEMENT    No Known Allergies  Current Outpatient Medications on File Prior to Visit  Medication Sig Dispense Refill  albuterol  MDI, PROVENTIL , VENTOLIN , PROAIR , HFA 90 mcg/actuation inhaler Inhale 2 inhalations into the lungs every 6 (six) hours as needed  amLODIPine  (NORVASC ) 5 MG tablet Take 5 mg by mouth once daily  atorvastatin  (LIPITOR) 20 MG tablet Take 20 mg by mouth once daily  carvedilol  (COREG ) 12.5 MG tablet Take 12.5 mg by mouth 2 (two) times daily  cloNIDine  HCL (CATAPRES ) 0.2 MG tablet Take 0.2 mg by mouth once daily  desipramine (NORPRAMIN) 50 MG tablet Take 100 mg by mouth at bedtime  ezetimibe (ZETIA) 10 mg tablet Take 10 mg by  mouth once daily  hydrALAZINE  (APRESOLINE ) 100 MG tablet Take 100 mg by mouth 2 (two) times daily  JARDIANCE  25 mg tablet Take 25 mg by mouth once daily  losartan  (COZAAR ) 50 MG tablet Take 50 mg by mouth once daily  tamsulosin (FLOMAX) 0.4 mg capsule Take 0.4 mg by mouth once daily   No current facility-administered medications on file prior to visit.   Family History  Problem Relation Age of Onset  High blood pressure (Hypertension) Mother  Diabetes Mother  High blood pressure (Hypertension) Father  Heart valve disease Father  High blood pressure (Hypertension) Sister  Heart valve disease Sister  Diabetes Sister    Social History   Tobacco Use  Smoking Status Every Day  Types: Cigarettes  Smokeless Tobacco Never    Social History   Socioeconomic History  Marital status: Married  Tobacco Use  Smoking status: Every Day  Types: Cigarettes  Smokeless tobacco: Never  Substance and Sexual Activity  Alcohol use: Never  Drug use: Yes  Comment: cocaine   Objective:   Vitals:  10/21/24 1122  BP: (!) 157/92  Pulse: 105  Temp: 36.7 C (98 F)  SpO2: 96%  Weight: 88.7 kg (195 lb 9.6 oz)  Height: 180.3 cm (5' 11)  PainSc: 0-No pain   Body mass index is 27.28 kg/m.  Physical Exam   He appears well on exam  There is a 7 cm x 6 cm mass on his right flank toward the back. It is mobile. There are slight skin changes but no cellulitis and no draining  areas. It is nonpulsatile  Labs, Imaging and Diagnostic Testing: I have reviewed the notes in the electronic medical records  Assessment and Plan:   Diagnoses and all orders for this visit:  Abdominal wall mass of right flank   This is a right flank mass of uncertain etiology. I suspect it may be a lipoma or a desmoid tumor. I do not believe it is a sebaceous cyst. Nonetheless it is getting larger and causing discomfort so surgical excision is recommended. I explained the surgery procedure with him in detail. I  discussed the risk which include but is not limited to bleeding, infection, cardiopulmonary issues with anesthesia, seroma formation, recurrence, the need for further procedures if malignancy is found, excetra.  I have discussed his drug use and how if this persists prior surgery the surgery could be canceled and he will try to abstain prior to surgery. We will try to do this under just a MAC anesthesia as an outpatient. He agrees with the plans  "

## 2024-11-02 ENCOUNTER — Encounter (HOSPITAL_COMMUNITY)
Admission: RE | Disposition: A | Discharge status: Home / Self Care | Payer: Self-pay | Source: Home / Self Care | Attending: Surgery

## 2024-11-02 ENCOUNTER — Ambulatory Visit (HOSPITAL_COMMUNITY): Admission: RE | Admit: 2024-11-02 | Source: Home / Self Care | Admitting: Surgery

## 2024-11-02 ENCOUNTER — Ambulatory Visit (HOSPITAL_COMMUNITY): Payer: Self-pay | Admitting: Physician Assistant

## 2024-11-02 ENCOUNTER — Other Ambulatory Visit: Payer: Self-pay

## 2024-11-02 ENCOUNTER — Encounter (HOSPITAL_COMMUNITY): Payer: Self-pay | Admitting: Surgery

## 2024-11-02 DIAGNOSIS — I110 Hypertensive heart disease with heart failure: Secondary | ICD-10-CM

## 2024-11-02 DIAGNOSIS — I2510 Atherosclerotic heart disease of native coronary artery without angina pectoris: Secondary | ICD-10-CM | POA: Diagnosis not present

## 2024-11-02 DIAGNOSIS — F17210 Nicotine dependence, cigarettes, uncomplicated: Secondary | ICD-10-CM | POA: Diagnosis not present

## 2024-11-02 DIAGNOSIS — R222 Localized swelling, mass and lump, trunk: Secondary | ICD-10-CM

## 2024-11-02 DIAGNOSIS — I5023 Acute on chronic systolic (congestive) heart failure: Secondary | ICD-10-CM | POA: Diagnosis not present

## 2024-11-02 HISTORY — PX: EXCISION MASS, BACK: SHX7560

## 2024-11-02 HISTORY — DX: Atherosclerotic heart disease of native coronary artery without angina pectoris: I25.10

## 2024-11-02 LAB — GLUCOSE, CAPILLARY
Glucose-Capillary: 99 mg/dL (ref 70–99)
Glucose-Capillary: 111 mg/dL — ABNORMAL HIGH (ref 70–99)
Glucose-Capillary: 114 mg/dL — ABNORMAL HIGH (ref 70–99)

## 2024-11-02 LAB — CBC
HCT: 38.7 % — ABNORMAL LOW (ref 39.0–52.0)
Hemoglobin: 12.3 g/dL — ABNORMAL LOW (ref 13.0–17.0)
MCH: 27.7 pg (ref 26.0–34.0)
MCHC: 31.8 g/dL (ref 30.0–36.0)
MCV: 87.2 fL (ref 80.0–100.0)
Platelets: 196 K/uL (ref 150–400)
RBC: 4.44 MIL/uL (ref 4.22–5.81)
RDW: 12.4 % (ref 11.5–15.5)
WBC: 4.6 K/uL (ref 4.0–10.5)
nRBC: 0 % (ref 0.0–0.2)

## 2024-11-02 LAB — COMPREHENSIVE METABOLIC PANEL WITH GFR
ALT: 17 U/L (ref 0–44)
AST: 22 U/L (ref 15–41)
Albumin: 3.9 g/dL (ref 3.5–5.0)
Alkaline Phosphatase: 74 U/L (ref 38–126)
Anion gap: 10 (ref 5–15)
BUN: 24 mg/dL — ABNORMAL HIGH (ref 8–23)
CO2: 23 mmol/L (ref 22–32)
Calcium: 9.3 mg/dL (ref 8.9–10.3)
Chloride: 105 mmol/L (ref 98–111)
Creatinine, Ser: 1.79 mg/dL — ABNORMAL HIGH (ref 0.61–1.24)
GFR, Estimated: 41 mL/min — ABNORMAL LOW
Glucose, Bld: 121 mg/dL — ABNORMAL HIGH (ref 70–99)
Potassium: 4.6 mmol/L (ref 3.5–5.1)
Sodium: 137 mmol/L (ref 135–145)
Total Bilirubin: 0.3 mg/dL (ref 0.0–1.2)
Total Protein: 6.7 g/dL (ref 6.5–8.1)

## 2024-11-02 SURGERY — EXCISION MASS, BACK
Anesthesia: Monitor Anesthesia Care | Site: Back | Laterality: Right

## 2024-11-02 MED ORDER — TRAMADOL HCL 50 MG PO TABS: 50.0000 mg | ORAL_TABLET | Freq: Four times a day (QID) | ORAL | 0 refills | Status: AC | PRN

## 2024-11-02 MED ORDER — LIDOCAINE-EPINEPHRINE (PF) 1 %-1:200000 IJ SOLN
INTRAMUSCULAR | Status: DC | PRN
  Administered 2024-11-02: 20 mL

## 2024-11-02 MED ORDER — SUCCINYLCHOLINE CHLORIDE 200 MG/10ML IV SOSY
PREFILLED_SYRINGE | INTRAVENOUS | Status: AC
  Filled 2024-11-02: qty 20

## 2024-11-02 MED ORDER — ONDANSETRON HCL 4 MG/2ML IJ SOLN
INTRAMUSCULAR | Status: DC | PRN
  Administered 2024-11-02: 4 mg via INTRAVENOUS

## 2024-11-02 MED ORDER — CHLORHEXIDINE GLUCONATE CLOTH 2 % EX PADS: 6.0000 | MEDICATED_PAD | Freq: Once | CUTANEOUS | Status: DC

## 2024-11-02 MED ORDER — LACTATED RINGERS IV SOLN: INTRAVENOUS | Status: DC

## 2024-11-02 MED ORDER — DEXAMETHASONE SOD PHOSPHATE PF 10 MG/ML IJ SOLN
INTRAMUSCULAR | Status: DC | PRN
  Administered 2024-11-02: 5 mg via INTRAVENOUS

## 2024-11-02 MED ORDER — MIDAZOLAM HCL (PF) 2 MG/2ML IJ SOLN
INTRAMUSCULAR | Status: DC | PRN
  Administered 2024-11-02: 1 mg via INTRAVENOUS

## 2024-11-02 MED ORDER — CHLORHEXIDINE GLUCONATE 0.12 % MT SOLN
15.0000 mL | Freq: Once | OROMUCOSAL | Status: AC
  Administered 2024-11-02: 15 mL via OROMUCOSAL
  Filled 2024-11-02: qty 15

## 2024-11-02 MED ORDER — FENTANYL CITRATE (PF) 250 MCG/5ML IJ SOLN
INTRAMUSCULAR | Status: DC | PRN
  Administered 2024-11-02: 25 ug via INTRAVENOUS

## 2024-11-02 MED ORDER — LIDOCAINE-EPINEPHRINE (PF) 1 %-1:200000 IJ SOLN
INTRAMUSCULAR | Status: AC
  Filled 2024-11-02: qty 30

## 2024-11-02 MED ORDER — PHENYLEPHRINE HCL-NACL 20-0.9 MG/250ML-% IV SOLN
INTRAVENOUS | Status: AC
  Filled 2024-11-02: qty 250

## 2024-11-02 MED ORDER — 0.9 % SODIUM CHLORIDE (POUR BTL) OPTIME
TOPICAL | Status: DC | PRN
  Administered 2024-11-02: 1000 mL

## 2024-11-02 MED ORDER — ACETAMINOPHEN 500 MG PO TABS
1000.0000 mg | ORAL_TABLET | ORAL | Status: AC
  Administered 2024-11-02: 1000 mg via ORAL
  Filled 2024-11-02: qty 2

## 2024-11-02 MED ORDER — ENSURE PRE-SURGERY PO LIQD: 296.0000 mL | Freq: Once | ORAL | Status: DC

## 2024-11-02 MED ORDER — FENTANYL CITRATE (PF) 250 MCG/5ML IJ SOLN
INTRAMUSCULAR | Status: AC
  Filled 2024-11-02: qty 5

## 2024-11-02 MED ORDER — LIDOCAINE HCL (PF) 1 % IJ SOLN
INTRAMUSCULAR | Status: AC
  Filled 2024-11-02: qty 30

## 2024-11-02 MED ORDER — CEFAZOLIN SODIUM-DEXTROSE 2-4 GM/100ML-% IV SOLN
2.0000 g | INTRAVENOUS | Status: AC
  Administered 2024-11-02: 2 g via INTRAVENOUS
  Filled 2024-11-02: qty 100

## 2024-11-02 MED ORDER — PROPOFOL 500 MG/50ML IV EMUL
INTRAVENOUS | Status: DC | PRN
  Administered 2024-11-02: 75 ug/kg/min via INTRAVENOUS

## 2024-11-02 MED ORDER — MIDAZOLAM HCL 2 MG/2ML IJ SOLN
INTRAMUSCULAR | Status: AC
  Filled 2024-11-02: qty 2

## 2024-11-02 MED ORDER — ORAL CARE MOUTH RINSE: 15.0000 mL | Freq: Once | OROMUCOSAL | Status: AC

## 2024-11-02 MED ORDER — PROPOFOL 10 MG/ML IV BOLUS
INTRAVENOUS | Status: DC | PRN
  Administered 2024-11-02: 30 mg via INTRAVENOUS
  Administered 2024-11-02: 20 mg via INTRAVENOUS

## 2024-11-02 MED ORDER — PROPOFOL 10 MG/ML IV BOLUS
INTRAVENOUS | Status: AC
  Filled 2024-11-02: qty 20

## 2024-11-02 MED ORDER — PHENYLEPHRINE 80 MCG/ML (10ML) SYRINGE FOR IV PUSH (FOR BLOOD PRESSURE SUPPORT)
PREFILLED_SYRINGE | INTRAVENOUS | Status: DC | PRN
  Administered 2024-11-02 (×4): 80 ug via INTRAVENOUS

## 2024-11-02 MED ORDER — LIDOCAINE 2% (20 MG/ML) 5 ML SYRINGE
INTRAMUSCULAR | Status: DC | PRN
  Administered 2024-11-02: 40 mg via INTRAVENOUS

## 2024-11-02 MED ORDER — PHENYLEPHRINE 80 MCG/ML (10ML) SYRINGE FOR IV PUSH (FOR BLOOD PRESSURE SUPPORT)
PREFILLED_SYRINGE | INTRAVENOUS | Status: AC
  Filled 2024-11-02: qty 10

## 2024-11-02 MED ORDER — PROPOFOL 1000 MG/100ML IV EMUL
INTRAVENOUS | Status: AC
  Filled 2024-11-02: qty 100

## 2024-11-02 MED ORDER — INSULIN ASPART 100 UNIT/ML IJ SOLN: 0.0000 [IU] | INTRAMUSCULAR | Status: DC | PRN

## 2024-11-02 SURGICAL SUPPLY — 24 items
BAG COUNTER SPONGE SURGICOUNT (BAG) ×1 IMPLANT
CANISTER SUCTION 3000ML PPV (SUCTIONS) IMPLANT
COVER SURGICAL LIGHT HANDLE (MISCELLANEOUS) ×1 IMPLANT
DERMABOND ADVANCED .7 DNX12 (GAUZE/BANDAGES/DRESSINGS) ×1 IMPLANT
DRAPE LAPAROSCOPIC ABDOMINAL (DRAPES) IMPLANT
DRAPE LAPAROTOMY 100X72 PEDS (DRAPES) IMPLANT
DRSG TEGADERM 4X4.75 (GAUZE/BANDAGES/DRESSINGS) ×1 IMPLANT
ELECTRODE REM PT RTRN 9FT ADLT (ELECTROSURGICAL) ×1 IMPLANT
GAUZE SPONGE 4X4 12PLY STRL (GAUZE/BANDAGES/DRESSINGS) ×1 IMPLANT
GLOVE SURG SIGNA 7.5 PF LTX (GLOVE) ×1 IMPLANT
GOWN STRL REUS W/ TWL LRG LVL3 (GOWN DISPOSABLE) ×1 IMPLANT
GOWN STRL REUS W/ TWL XL LVL3 (GOWN DISPOSABLE) ×1 IMPLANT
KIT BASIN OR (CUSTOM PROCEDURE TRAY) ×1 IMPLANT
KIT TURNOVER KIT B (KITS) ×1 IMPLANT
NEEDLE HYPO 25GX1X1/2 BEV (NEEDLE) ×1 IMPLANT
PACK GENERAL/GYN (CUSTOM PROCEDURE TRAY) ×1 IMPLANT
PAD ARMBOARD POSITIONER FOAM (MISCELLANEOUS) ×1 IMPLANT
PENCIL SMOKE EVACUATOR (MISCELLANEOUS) ×1 IMPLANT
SOLN 0.9% NACL POUR BTL 1000ML (IV SOLUTION) ×1 IMPLANT
SUT MNCRL AB 4-0 PS2 18 (SUTURE) ×1 IMPLANT
SUT VIC AB 3-0 SH 27XBRD (SUTURE) ×1 IMPLANT
SYR CONTROL 10ML LL (SYRINGE) ×1 IMPLANT
TOWEL GREEN STERILE (TOWEL DISPOSABLE) ×1 IMPLANT
TOWEL GREEN STERILE FF (TOWEL DISPOSABLE) ×1 IMPLANT

## 2024-11-02 NOTE — Anesthesia Postprocedure Evaluation (Signed)
"   Anesthesia Post Note  Patient: Phillip Kennedy  Procedure(s) Performed: EXCISION MASS,RIGHT FLANK BACK (Right: Back)     Patient location during evaluation: PACU Anesthesia Type: MAC Level of consciousness: awake and alert Pain management: pain level controlled Vital Signs Assessment: post-procedure vital signs reviewed and stable Respiratory status: spontaneous breathing, nonlabored ventilation and respiratory function stable Cardiovascular status: stable and blood pressure returned to baseline Anesthetic complications: no   No notable events documented.  Last Vitals:  Vitals:   11/02/24 1553 11/02/24 1600  BP: 108/79 (!) 114/90  Pulse: 73 74  Resp: 16 16  Temp: 36.5 C   SpO2: 96% 96%    Last Pain:  Vitals:   11/02/24 1553  TempSrc:   PainSc: 0-No pain                 Debby FORBES Like      "

## 2024-11-02 NOTE — Anesthesia Procedure Notes (Signed)
 Procedure Name: MAC Date/Time: 11/02/2024 3:20 PM  Performed by: Daleysa Kristiansen C., CRNAPre-anesthesia Checklist: Patient identified, Emergency Drugs available, Suction available, Patient being monitored and Timeout performed Patient Re-evaluated:Patient Re-evaluated prior to induction Oxygen Delivery Method: Simple face mask Preoxygenation: Pre-oxygenation with 100% oxygen Induction Type: IV induction Placement Confirmation: positive ETCO2

## 2024-11-02 NOTE — Discharge Instructions (Signed)
 You may shower starting tomorrow  Ice pack and tylenol  also for pain  No vigorous activity for one week

## 2024-11-02 NOTE — Op Note (Signed)
" ° °  Phillip Kennedy 11/02/2024   Pre-op Diagnosis: RIGHT FLANK MASS     Post-op Diagnosis: SAME  Procedures: EXCISION RIGHT FLANK MASS (8 CM)  Surgeon(s): Vernetta Berg, MD  Anesthesia: Monitor Anesthesia Care  Staff:  Circulator: Perley Rollo BIRCH, RN Scrub Person: Trudy Ami CROME, CST  Estimated Blood Loss: Minimal               Specimens: SENT TO PATH  Findings: The patient was found to have an 8 cm right flank mass consistent with lipoma.  It was sent to pathology for evaluation.  Procedure: The patient was brought to the op room and identified the correct patient.  He was placed upon the operating table and anesthesia was induced.  His right flank was then prepped and draped in usual sterile fashion.  I anesthetized skin over the mass on the right flank with lidocaine  with epinephrine .  I then made a transverse incision with a scalpel.  I then dissected down through subcutaneous tissue to the mass with electrocautery.  The mass appeared consistent with a lipoma and was easily elevated up out of the incision.  I then completely excised the mass with the electrocautery.  The mass measured 8 cm.  I sent to pathology for evaluation.  I achieved hemostasis with cautery.  I then closed the subcutaneous tissue with interrupted 3-0 Vicryl sutures and closed the skin with a running 4-0 Monocryl.  Dermabond was then applied.  The patient tolerated the procedure well.  All the counts were correct at the end of the procedure.  The patient was then taken in stable condition from the operating room to the recovery room.          Berg Vernetta   Date: 11/02/2024  Time: 3:45 PM    "

## 2024-11-02 NOTE — Transfer of Care (Signed)
 Immediate Anesthesia Transfer of Care Note  Patient: Phillip Kennedy Line  Procedure(s) Performed: EXCISION MASS,RIGHT FLANK BACK (Right: Back)  Patient Location: PACU  Anesthesia Type:MAC  Level of Consciousness: awake and drowsy  Airway & Oxygen Therapy: Patient Spontanous Breathing and Patient connected to face mask oxygen  Post-op Assessment: Report given to RN and Post -op Vital signs reviewed and stable  Post vital signs: Reviewed and stable  Last Vitals:  Vitals Value Taken Time  BP 108/79 11/02/24 15:52  Temp    Pulse 74 11/02/24 15:53  Resp 18 11/02/24 15:53  SpO2 100 % 11/02/24 15:53  Vitals shown include unfiled device data.  Last Pain:  Vitals:   11/02/24 1220  TempSrc:   PainSc: 0-No pain         Complications: No notable events documented.

## 2024-11-02 NOTE — Interval H&P Note (Signed)
 History and Physical Interval Note:no change in H and P  11/02/2024 1:08 PM  Phillip Kennedy  has presented today for surgery, with the diagnosis of RIGHT FLANK MASS.  The various methods of treatment have been discussed with the patient and family. After consideration of risks, benefits and other options for treatment, the patient has consented to  Procedures with comments: EXCISION MASS, BACK (Right) - RIGHT FLANK MASS as a surgical intervention.  The patient's history has been reviewed, patient examined, no change in status, stable for surgery.  I have reviewed the patient's chart and labs.  Questions were answered to the patient's satisfaction.     Vicenta Poli

## 2024-11-03 ENCOUNTER — Encounter (HOSPITAL_COMMUNITY): Payer: Self-pay | Admitting: Surgery

## 2024-11-04 LAB — SURGICAL PATHOLOGY

## 2024-11-19 NOTE — Progress Notes (Signed)
 Phillip Kennedy                                          MRN: [id number]   11/19/2024   The [facility] Quality Team Specialist reviewed this patient medical record for the purposes of chart review for care gap closure. The following were reviewed: chart review for care gap closure-glycemic status assessment.    [facility] Quality Team
